# Patient Record
Sex: Male | Born: 1950 | Race: White | Hispanic: No | Marital: Married | Smoking: Former smoker
Health system: Southern US, Community
[De-identification: ages and names within clinical notes are randomized; demographics above are authoritative.]

## PROBLEM LIST (undated history)

## (undated) DIAGNOSIS — C4491 Basal cell carcinoma of skin, unspecified: Secondary | ICD-10-CM

## (undated) DIAGNOSIS — E8881 Metabolic syndrome: Secondary | ICD-10-CM

## (undated) DIAGNOSIS — L821 Other seborrheic keratosis: Secondary | ICD-10-CM

## (undated) DIAGNOSIS — F329 Major depressive disorder, single episode, unspecified: Secondary | ICD-10-CM

## (undated) DIAGNOSIS — C4490 Unspecified malignant neoplasm of skin, unspecified: Secondary | ICD-10-CM

## (undated) DIAGNOSIS — I2510 Atherosclerotic heart disease of native coronary artery without angina pectoris: Secondary | ICD-10-CM

## (undated) DIAGNOSIS — I209 Angina pectoris, unspecified: Secondary | ICD-10-CM

## (undated) DIAGNOSIS — G4730 Sleep apnea, unspecified: Secondary | ICD-10-CM

## (undated) DIAGNOSIS — K219 Gastro-esophageal reflux disease without esophagitis: Secondary | ICD-10-CM

## (undated) DIAGNOSIS — C44629 Squamous cell carcinoma of skin of left upper limb, including shoulder: Secondary | ICD-10-CM

## (undated) DIAGNOSIS — E119 Type 2 diabetes mellitus without complications: Secondary | ICD-10-CM

## (undated) DIAGNOSIS — E291 Testicular hypofunction: Secondary | ICD-10-CM

## (undated) DIAGNOSIS — I10 Essential (primary) hypertension: Secondary | ICD-10-CM

## (undated) DIAGNOSIS — R0601 Orthopnea: Secondary | ICD-10-CM

## (undated) DIAGNOSIS — I219 Acute myocardial infarction, unspecified: Secondary | ICD-10-CM

## (undated) DIAGNOSIS — F32A Depression, unspecified: Secondary | ICD-10-CM

## (undated) DIAGNOSIS — E785 Hyperlipidemia, unspecified: Secondary | ICD-10-CM

## (undated) HISTORY — PX: BACK SURGERY: SHX140

## (undated) HISTORY — DX: Hyperlipidemia, unspecified: E78.5

## (undated) HISTORY — DX: Unspecified malignant neoplasm of skin, unspecified: C44.90

## (undated) HISTORY — DX: Gastro-esophageal reflux disease without esophagitis: K21.9

## (undated) HISTORY — DX: Testicular hypofunction: E29.1

## (undated) HISTORY — DX: Basal cell carcinoma of skin, unspecified: C44.91

## (undated) HISTORY — DX: Metabolic syndrome: E88.810

## (undated) HISTORY — DX: Other seborrheic keratosis: L82.1

## (undated) HISTORY — PX: HERNIA REPAIR: SHX51

## (undated) HISTORY — DX: Major depressive disorder, single episode, unspecified: F32.9

## (undated) HISTORY — DX: Metabolic syndrome and other insulin resistance: E88.81

## (undated) HISTORY — PX: CORONARY STENT PLACEMENT: SHX1402

## (undated) HISTORY — DX: Depression, unspecified: F32.A

## (undated) HISTORY — DX: Squamous cell carcinoma of skin of left upper limb, including shoulder: C44.629

## (undated) HISTORY — PX: KNEE SURGERY: SHX244

---

## 2002-04-01 ENCOUNTER — Emergency Department (HOSPITAL_COMMUNITY): Admission: EM | Admit: 2002-04-01 | Discharge: 2002-04-02 | Payer: Self-pay | Admitting: Emergency Medicine

## 2002-04-02 ENCOUNTER — Encounter: Payer: Self-pay | Admitting: Emergency Medicine

## 2003-05-10 ENCOUNTER — Emergency Department (HOSPITAL_COMMUNITY): Admission: EM | Admit: 2003-05-10 | Discharge: 2003-05-10 | Payer: Self-pay | Admitting: Emergency Medicine

## 2003-12-16 ENCOUNTER — Emergency Department (HOSPITAL_COMMUNITY): Admission: EM | Admit: 2003-12-16 | Discharge: 2003-12-16 | Payer: Self-pay | Admitting: Emergency Medicine

## 2006-05-13 ENCOUNTER — Ambulatory Visit (HOSPITAL_BASED_OUTPATIENT_CLINIC_OR_DEPARTMENT_OTHER): Admission: RE | Admit: 2006-05-13 | Discharge: 2006-05-13 | Payer: Self-pay | Admitting: Surgery

## 2006-09-22 ENCOUNTER — Inpatient Hospital Stay (HOSPITAL_COMMUNITY): Admission: EM | Admit: 2006-09-22 | Discharge: 2006-09-24 | Payer: Self-pay | Admitting: Emergency Medicine

## 2006-11-17 ENCOUNTER — Ambulatory Visit (HOSPITAL_COMMUNITY): Admission: RE | Admit: 2006-11-17 | Discharge: 2006-11-17 | Payer: Self-pay | Admitting: Cardiology

## 2007-01-14 ENCOUNTER — Emergency Department (HOSPITAL_COMMUNITY): Admission: EM | Admit: 2007-01-14 | Discharge: 2007-01-14 | Payer: Self-pay | Admitting: Family Medicine

## 2007-03-24 ENCOUNTER — Encounter: Admission: RE | Admit: 2007-03-24 | Discharge: 2007-03-24 | Payer: Self-pay | Admitting: Surgery

## 2007-10-07 ENCOUNTER — Inpatient Hospital Stay (HOSPITAL_COMMUNITY): Admission: RE | Admit: 2007-10-07 | Discharge: 2007-10-09 | Payer: Self-pay | Admitting: Surgery

## 2008-02-24 ENCOUNTER — Observation Stay (HOSPITAL_COMMUNITY): Admission: EM | Admit: 2008-02-24 | Discharge: 2008-02-26 | Payer: Self-pay | Admitting: Emergency Medicine

## 2008-08-08 ENCOUNTER — Ambulatory Visit (HOSPITAL_COMMUNITY): Admission: RE | Admit: 2008-08-08 | Discharge: 2008-08-08 | Payer: Self-pay | Admitting: Cardiology

## 2008-10-04 ENCOUNTER — Ambulatory Visit (HOSPITAL_COMMUNITY): Admission: RE | Admit: 2008-10-04 | Discharge: 2008-10-04 | Payer: Self-pay | Admitting: Cardiology

## 2009-04-01 ENCOUNTER — Ambulatory Visit (HOSPITAL_COMMUNITY): Admission: RE | Admit: 2009-04-01 | Discharge: 2009-04-01 | Payer: Self-pay | Admitting: Surgery

## 2010-10-20 ENCOUNTER — Other Ambulatory Visit: Payer: Self-pay | Admitting: Occupational Medicine

## 2010-10-20 DIAGNOSIS — S335XXA Sprain of ligaments of lumbar spine, initial encounter: Secondary | ICD-10-CM

## 2010-10-24 ENCOUNTER — Ambulatory Visit (HOSPITAL_COMMUNITY)
Admission: RE | Admit: 2010-10-24 | Discharge: 2010-10-24 | Disposition: A | Discharge status: Home / Self Care | Payer: 59 | Source: Ambulatory Visit | Attending: Occupational Medicine | Admitting: Occupational Medicine

## 2010-10-24 ENCOUNTER — Other Ambulatory Visit: Payer: Self-pay | Admitting: Occupational Medicine

## 2010-10-24 DIAGNOSIS — M47817 Spondylosis without myelopathy or radiculopathy, lumbosacral region: Secondary | ICD-10-CM | POA: Insufficient documentation

## 2010-10-24 DIAGNOSIS — M79609 Pain in unspecified limb: Secondary | ICD-10-CM | POA: Insufficient documentation

## 2010-10-24 DIAGNOSIS — Z1810 Retained metal fragments, unspecified: Secondary | ICD-10-CM

## 2010-10-24 DIAGNOSIS — S335XXA Sprain of ligaments of lumbar spine, initial encounter: Secondary | ICD-10-CM

## 2010-11-21 ENCOUNTER — Ambulatory Visit: Payer: PRIVATE HEALTH INSURANCE | Attending: Occupational Medicine | Admitting: Physical Therapy

## 2010-11-21 DIAGNOSIS — M545 Low back pain, unspecified: Secondary | ICD-10-CM | POA: Insufficient documentation

## 2010-11-21 DIAGNOSIS — M2560 Stiffness of unspecified joint, not elsewhere classified: Secondary | ICD-10-CM | POA: Insufficient documentation

## 2010-11-21 DIAGNOSIS — IMO0001 Reserved for inherently not codable concepts without codable children: Secondary | ICD-10-CM | POA: Insufficient documentation

## 2010-11-24 ENCOUNTER — Ambulatory Visit: Payer: PRIVATE HEALTH INSURANCE | Admitting: Physical Therapy

## 2010-12-02 ENCOUNTER — Ambulatory Visit: Payer: PRIVATE HEALTH INSURANCE | Attending: Occupational Medicine | Admitting: Physical Therapy

## 2010-12-02 DIAGNOSIS — M545 Low back pain, unspecified: Secondary | ICD-10-CM | POA: Insufficient documentation

## 2010-12-02 DIAGNOSIS — IMO0001 Reserved for inherently not codable concepts without codable children: Secondary | ICD-10-CM | POA: Insufficient documentation

## 2010-12-02 DIAGNOSIS — M2560 Stiffness of unspecified joint, not elsewhere classified: Secondary | ICD-10-CM | POA: Insufficient documentation

## 2010-12-05 ENCOUNTER — Ambulatory Visit: Payer: PRIVATE HEALTH INSURANCE | Admitting: Physical Therapy

## 2010-12-08 ENCOUNTER — Encounter: Payer: 59 | Admitting: Physical Therapy

## 2010-12-12 ENCOUNTER — Ambulatory Visit: Payer: PRIVATE HEALTH INSURANCE | Admitting: Physical Therapy

## 2010-12-16 ENCOUNTER — Ambulatory Visit: Payer: PRIVATE HEALTH INSURANCE | Admitting: Physical Therapy

## 2010-12-16 NOTE — Discharge Summary (Signed)
[name], [name] NO.:  [id number]   MEDICAL RECORD NO.:  [id number]          PATIENT TYPE:  INP   LOCATION:  3728                         FACILITY:  [facility]   PHYSICIAN:  Ricki Rodriguez, M.D.  DATE OF BIRTH:  [birth year]   DATE OF ADMISSION:  02/24/2008  DATE OF DISCHARGE:  02/26/2008                               DISCHARGE SUMMARY   FINAL DIAGNOSES:  1. Bronchitis.  2. Chest pain.  3. Hypertension.  4. Tobacco use disorder.  5. Alcohol use disorder.  6. Hyperlipidemia.  7. Depression.   DISCHARGE DIET:  Low-sodium, heart-healthy diet.   DISCHARGE ACTIVITY:  The patient to increase activity slowly.   SPECIAL INSTRUCTION:  The patient to stop any activity that causes chest  pain, shortness of breath, dizziness, sweating, or excessive weakness.  Return to work after 48 hours.  Followup by Dr. Orpah Cobb or by primary care physician in 1-2 weeks.   HISTORY:  This 60 year old white male presented with 1-week history of  cough and cold along with some chest pain, has heavy feeling.  The  patient states his sputum is nasty and he was afebrile in the emergency  room and for the rest of his stay in the hospital.   PHYSICAL EXAMINATION:  VITAL SIGNS:  Temperature 98, pulse 75,  respirations 15, and blood pressure 113/82.  GENERAL:  The patient is a 60 year old white male, well built, well  nourished, in mild distress.  HEENT:  The patient is normocephalic and atraumatic.  Conjunctivae pink.  Sclerae white.  NECK:  No JVD.  LUNGS:  Decreased air entry at both bases.  HEART:  Normal S1 and S2.  ABDOMEN:  Soft and nontender.  EXTREMITIES:  No edema.  SKIN:  Warm and dry.  NEUROLOGICALLY:  The patient moved all 4 extremities.  Cranial nerves  are grossly intact.   LABORATORY DATA:  Normal hemoglobin, hematocrit, WBC count, and platelet  count.  Normal electrolytes, BUN, and creatinine.  Normal CK-MB and  troponin I.  Chest x-ray was suggestive of  bronchitis.   HOSPITAL COURSE:  The patient was placed in telemetry unit with a  droplet isolation.  He was started on IV Rocephin and IV Zithromax.  He  had significant improvement in 24-48 hours of hospitalization.  He  remained afebrile in the hospital.  His respiratory condition improved  and he was discharged home in satisfactory condition with a followup by  me or by his primary care physician in 1-2 weeks.      Ricki Rodriguez, M.D.  Electronically Signed     ASK/MEDQ  D:  02/26/2008  T:  02/26/2008  Job:  [id number]

## 2010-12-16 NOTE — Op Note (Signed)
Jimmy [name], Jimmy [name] NO.:  [id number]   MEDICAL RECORD NO.:  [id number]          PATIENT TYPE:  INP   LOCATION:  0004                         FACILITY:  [facility]   PHYSICIAN:  Wilmon Arms. Corliss Skains, M.D. DATE OF BIRTH:  [birth year]   DATE OF PROCEDURE:  10/07/2007  DATE OF DISCHARGE:                               OPERATIVE REPORT   PREOPERATIVE DIAGNOSIS:  Recurrent umbilical hernia.   POSTOPERATIVE DIAGNOSIS:  Recurrent umbilical hernia.   PROCEDURE PERFORMED:  Laparoscopic mesh repair of recurrent umbilical  hernia.   SURGEON:  Dr. Manus Rudd.   ANESTHESIA:  General endotracheal.   INDICATIONS:  The patient is a 60 year old male who underwent open  repair of an umbilical hernia with a small Ventralex mesh on May 13, 2006.  He presented in the summer of 2008 with recurrent swelling at the  umbilicus.  A CT scan showed recurrence in the lower part of his  herniorrhaphy containing only fat.  He denies any obstructive symptoms.  We waited to repair the recurrent hernia for cardiac reasons. The  patient has a new stent in place.  He has now been cleared by Dr.  Sharyn Lull and the plan is to repair of his hernia laparoscopically.   DESCRIPTION OF PROCEDURE:  The patient is brought to the operating room  and placed in a supine position on the operating room table.  After an  adequate level of general anesthesia was obtained, the patient had a  Foley catheter placed under sterile technique.  His abdomen was prepped  with Betadine and draped in a sterile fashion.  A time-out was taken to  assure the proper patient and proper procedure.  In the left anterior  axillary line, we used a 5-mm OptiVu trocar to cannulate the peritoneal  cavity.  We insufflated CO2 maintaining maximum pressure at 15 mmHg.  The laparoscope was inserted and we noted a lot of omental adhesions to  the undersurface of the umbilicus.  Two more 5 mm-ports were placed on  the left side.  The  harmonic scalpel was used to take down all of these  omental adhesions.  The mesh was exposed and actually seemed to be in  good placement.  However, with direct palpation it seemed that the  hernia had recurred at the lower edge of this mesh.  We then chose to  cover the entire mesh with a 10 x 15 cm Proceed mesh.  About four stay  sutures of #0 Prolene were placed superiorly, inferiorly and laterally  in the mesh.  The Endoclose device was used to pull up the stay sutures  through stab incisions.  This pulled the mesh up to cover all of the  previous mesh as well as the hernia defect.  The stay sutures were all  tied down.  The Protac device was then used to place a ring of tacks at  1 cm intervals all the way around the mesh.  Several extra tacks were  placed inside this outer ring of tacks. The omentum was placed to cover  the small bowel underneath the mesh.  We  then released the  pneumoperitoneum under direct vision as the trocars were removed. The  pressure released on the pneumoperitoneum.  The largest port site was  closed with a #0 Vicryl using the Endoclose device.  All the trocars were removed.  4-0 Monocryl was used to place some deep  dermal stitches.  Dermabond was used to close the skin.  The patient was  then extubated and brought to the recovery room in stable condition.  All sponge, instrument and needle counts were correct.  His Foley  catheter was removed.      Wilmon Arms. Tsuei, M.D.  Electronically Signed     MKT/MEDQ  D:  10/07/2007  T:  10/08/2007  Job:  [id number]   cc:   Eduardo Osier. Sharyn Lull, M.D.  Fax: [phone]

## 2010-12-16 NOTE — Cardiovascular Report (Signed)
NAMEFADI, MENTER NO.:  [id number]   MEDICAL RECORD NO.:  [id number]          PATIENT TYPE:  OIB   LOCATION:  2899                         FACILITY:  [facility]   PHYSICIAN:  Eduardo Osier. Sharyn Lull, M.D. DATE OF BIRTH:  04-04-1951   DATE OF PROCEDURE:  10/04/2008  DATE OF DISCHARGE:  10/04/2008                            CARDIAC CATHETERIZATION   PROCEDURE:  Left cardiac catheterization with selective left and right  coronary angiography, left ventricular graft via right groin using  Judkins technique.   INDICATIONS FOR PROCEDURE:  Mr. Winsor is a 60 year old white male with  past medical history significant for coronary artery disease, status  post PTCA stenting to proximal LAD, hypertension, hypercholesteremia,  history of tobacco abuse, GERD, depression, positive family history of  coronary artery disease, complains of recurrent left-sided chest pain  off and on, relieved with sublingual nitroglycerin, also complains of  exertional chest pain associated with feeling weak.  States chest pain  is similar in nature when he had PCI to LAD.  EKG done in the office  showed normal sinus rhythm with nonspecific T-wave changes.  Denies any  relation of chest pain to food, breathing, or movement.  Denies  palpitation, lightheadedness, or syncope.  Denies PND, orthopnea, or leg  swelling.   PAST MEDICAL HISTORY:  As above.   PAST SURGICAL HISTORY:  He had umbilical hernia repair in the past, had  wisdom tooth extraction in the past.   ALLERGIES:  No known drug allergies.   MEDICATIONS AT HOME:  1. He is on enteric-coated aspirin 81 mg p.o. daily.  2. Plavix 75 mg p.o. daily.  3. Toprol-XL 50 mg p.o. daily.  4. AZOR 5/40 p.o. daily.  5. Lipitor 20 mg p.o. daily.  6. Prevacid 30 mg p.o. daily.  7. Lexapro 10 mg p.o. daily.   SOCIAL HISTORY:  He is married and has one child.  Smoked less than one  pack per day for 20+ years.  Drinks beer socially.  Works for [employer].   Family history is positive for coronary artery disease.  His father died  of MI.  He was hypertensive.  Mother died of dementia.  Two brothers and  four sisters are in good health.   PHYSICAL EXAMINATION:  GENERAL:  He is alert, awake, and oriented x3 in  no acute distress.  VITAL SIGNS:  Blood pressure is 130/80, pulse was 76 and regular.  HEENT:  Conjunctiva was pink.  NECK:  Supple.  No JVD, no bruit.  LUNGS:  Clear to auscultation without rhonchi or rales.  CARDIOVASCULAR:  S1 and S2 was normal.  There was soft systolic murmur.  There was no S3 or gallop.  ABDOMEN:  Soft.  Bowel sounds were present and nontender.  EXTREMITIES:  There was no clubbing, cyanosis, or edema.   IMPRESSION:  New onset angina rule out progression of disease,  hypertension, hypercholesteremia, tobacco abuse, depression, positive  family history of coronary artery disease.  Discussed with the patient  regarding various options of treatment i.e. noninvasive stress testing  versus left cath, possible PTCA stenting.  Its risks and benefits i.e.  death, MI, stroke, need for emergency, CABG, risk of restenosis, local  vascular complications, etc. and consented for the procedure.   PROCEDURE:  After obtaining the informed consent, the patient was  brought to the cath lab and was placed on fluoroscopy table.  Right  groin was prepped and draped in usual fashion.  A 2% Xylocaine was used  for local anesthesia and the right groin with help of thin-wall needle.  A 5-French arterial sheath was placed without difficulty.  Sheath was  aspirated and flushed.  Next, a 5-French left Judkins catheter was  advanced over the wire under fluoroscopic guidance up to the ascending  aorta.  Wire was pulled out, the catheter was aspirated and connected to  the manifold.  Catheter was further advanced and engaged into left  coronary ostium.  Of note, the patient has short left main and this  catheter was  selectively engaged into left circumflex.  Multiple views  of this system were taken.  Next, catheter was disengaged and was  exchanged over the wire to 3.5 5 [place] diagnostic catheter, which was  advanced over the wire under fluoroscopic guidance up to the ascending  aorta.  Wire was pulled out, the catheter was aspirated and connected to  the manifold.  Catheter was further advanced and attempted to engage  into LAD without success.  Next, this catheter was pulled out over the  wire.  Sheaths were aspirated and flushed.  A 5-French sheath was  changed to 6-French sheath and then 3.0 left guiding catheter was  advanced over the wire under fluoroscopic guidance up to the ascending  aorta.  Wire was pulled out, the catheter was aspirated and connected to  the manifold.  Catheter was further advanced and engaged into left  coronary ostium.  Multiple views of the left system were taken.  Next,  the catheter was disengaged and was pulled out over the wire and was  replaced with 6-French right Judkins catheter, which was advanced over  the wire under fluoroscopic guidance up to the ascending aorta.  Wire  was pulled out, the catheter was aspirated and connected to the  manifold.  Catheter was further advanced and engaged into right coronary  ostium.  Multiple views of the right system were taken.  Next, the  catheter was disengaged and was pulled out over the wire and was  replaced with 6-French pigtail catheter which was advanced over the wire  under fluoroscopic guidance up to the ascending aorta.  Wire was pulled  out, the catheter was aspirated and connected to the manifold.  Catheter  was further advanced across the aortic valve into the LV.  LV pressures  were recorded.  Next, LV graft was done in 30 degrees RAO position.  Postangiographic pressures were recorded from LV and then pullback  pressures were recorded from the aorta and there was no gradient across  the aortic valve.  Next,  pigtail catheter was pulled out over the wire.  Sheaths were aspirated and flushed.   FINDINGS:  LV showed good LV systolic function, EF of 55-60%.  Left main  was short, which was patent.  LAD has 20-25% proximal stenosis at the  proximal edge of the stent and 5-10% stenosis at the distal edge of the  stent.  Diagonal 1 is small which has 15-20% stenosis.  Diagonal 2 and 3  were very small, left circumflex was patent.  OM1 and OM2 were very  small, which  were patent.  OM3 was moderate size, which was patent, OM4  was small, which was patent.  RCA has 15-20% proximal stenosis.  Vessel  is large and right dominant.  The patient has right dominant coronary  system.  PDA and PLV branches were patent.  The patient tolerated the  procedure well.  There were no complications.  The patient was  transferred to recovery room in stable condition.      Eduardo Osier. Sharyn Lull, M.D.  Electronically Signed     MNH/MEDQ  D:  10/04/2008  T:  10/05/2008  Job:  [id number]

## 2010-12-18 ENCOUNTER — Encounter: Payer: PRIVATE HEALTH INSURANCE | Admitting: Physical Therapy

## 2010-12-19 ENCOUNTER — Ambulatory Visit
Admission: RE | Admit: 2010-12-19 | Discharge: 2010-12-19 | Disposition: A | Discharge status: Home / Self Care | Payer: PRIVATE HEALTH INSURANCE | Source: Ambulatory Visit | Attending: Surgery | Admitting: Surgery

## 2010-12-19 ENCOUNTER — Other Ambulatory Visit (INDEPENDENT_AMBULATORY_CARE_PROVIDER_SITE_OTHER): Payer: Self-pay | Admitting: Surgery

## 2010-12-19 DIAGNOSIS — R52 Pain, unspecified: Secondary | ICD-10-CM

## 2010-12-19 DIAGNOSIS — R222 Localized swelling, mass and lump, trunk: Secondary | ICD-10-CM

## 2010-12-19 MED ORDER — IOHEXOL 300 MG/ML  SOLN: 100.0000 mL | Freq: Once | INTRAMUSCULAR | Status: AC | PRN

## 2010-12-19 NOTE — Discharge Summary (Signed)
Jimmy Green, Jimmy Green NO.:  [id number]   MEDICAL RECORD NO.:  [id number]          PATIENT TYPE:  INP   LOCATION:  6529                         FACILITY:  [facility]   PHYSICIAN:  Mohan N. Sharyn Lull, M.D. DATE OF BIRTH:  April 27, 1951   DATE OF ADMISSION:  09/22/2006  DATE OF DISCHARGE:  09/24/2006                               DISCHARGE SUMMARY   ADMITTING DIAGNOSIS:  1. New onset angina.  2. New onset hypertension.  3. GERD.  4. Her tobacco abuse.  5. Positive family history of coronary artery disease.  6. Depression.   FINAL DIAGNOSIS:  1. New onset angina status post PCI to LAD.  2. Hypertension.  3. Hypercholesteremia.  4. Tobacco abuse.  5. Positive family history of coronary artery disease.  6. Depression.   DISCHARGE MEDICATIONS:  1. Enteric-coated aspirin 325 mg one tablet daily for one month and      then 81 mg one tablet daily.  2. Plavix 75 mg one tablet daily with food.  3. Toprol XL 50 mg one tablet daily.  4. Avapro 300 mg one tablet daily.  5. Norvasc 5 mg one tablet daily.  6. Lipitor 40 mg one tablet daily.  7. Plavix 30 mg one capsule daily as before.  8. Lexapro 10 mg one tablet daily.  9. Nitrostat 0.4 mg sublingual used as directed.   DISCHARGE INSTRUCTIONS:  1. Diet:  Diet low salt, low cholesterol.  2. Special instructions:  Post PTCA stent instructions have been      given.  3. Activity:  The patient has been advised to avoid any lifting,      pushing or pulling for 48 hours.   FOLLOWUP:  Follow-up with me on Monday next week.   CONDITION ON DISCHARGE:  Monday next week.  Condition at discharge is  stable.  The patient is scheduled for phase II cardiac rehab as  outpatient.   BRIEF HISTORY AND HOSPITAL COURSE:  Mr. Jimmy Green is a 60 year old white  male with past medical history significant for GERD, positive family  history of coronary artery disease, tobacco abuse, depression, complains  of left-sided chest pain off and on  since this morning radiating to the  left arm associated with nausea and mild shortness of breath.  States  while at work this a.m. developed left-sided chest pain grade 10/10,  sharp in pressure, and was noted to have elevated blood pressure.  The  patient was referred to Urgent Care and was referred back to the ER.  Denies any history of exertional chest pain.  Denies chest pain at  present.  The patient states he felt weak and diaphoretic this morning  which lasted for a few minutes.   PAST MEDICAL HISTORY:  As above.   PAST SURGICAL HISTORY:  1. He had umbilical hernia repair approximately 6 months ago.  2. Wisdom tooth extraction many years ago.   ALLERGIES:  NO KNOWN DRUG ALLERGIES.   MEDICATIONS AT HOME:  1. Aspirin.  2. Prevacid.  3. Lexapro.   SOCIAL HISTORY:  He is married and has one child.  Smokes less than one  pack per week for 20+ years.  Drinks beer socially, occasionally he  works at [employer].   FAMILY HISTORY:  Father died of MI, was hypertensive.  He also had CA.  Mother died of dementia.  Two brothers and four sisters in good health.   PHYSICAL EXAMINATION:  GENERAL:  On examination, He was alert and  oriented x3.  Blood pressure was 161/97, pulse was 74 regular.  Conjunctivae was pink.  NECK:  Supple with JVD, no bruit.  LUNGS:  Clear to auscultation without rhonchi or rales.  CARDIOVASCULAR:  S1, S2.  normal.  There was soft S4 gallop and systolic  murmur.  ABDOMEN:  Soft.  Bowel sounds present, nontender.  EXTREMITIES: There is no clubbing, cyanosis or edema.   STUDIES:  EKG showed normal sinus rhythm with no acute ischemic changes.   LABORATORY DATA:  Cholesterol was 209, triglyceride 167, HDL was  slightly low at 35, LDL was elevated 141.  Three sets of cardiac enzymes  were negative.  CK 98, MB 1.2; second set CK 97, MB 1.0; third set CK  65, MB 1.9.  Troponin I three sets were 0.02.  Sodium 137, potassium  4.3, chloride 108,  bicarbonate 27, glucose was 99, BUN 22, creatinine  0.64.  Hemoglobin was 15.1, hematocrit 44.0, white count 5.7.  Post  procedure CPK is 81, MB 3.4.  Today his labs were hemoglobin was 15.3,  hematocrit 43.1, potassium 3.9, glucose 95.   BRIEF HOSPITAL COURSE:  The patient was admitted to telemetry unit.  MI  was ruled out by serial enzymes and EKGs.  The patient underwent PTCA  and left cardiac cath and PTCA and stenting to LAD as per procedure  report on February 21.  Patient tolerated procedure well.  Postprocedure, the patient did not have any anginal chest pain.  The patient has been ambulating without any problems and was stable with  no evidence of hematoma.  Postprocedure EKG and cardiac findings are  normal.  The patient will be discharged home on above medications and  will be followed up in my office in the next week.           ______________________________  Eduardo Osier. Sharyn Lull, M.D.     MNH/MEDQ  D:  09/24/2006  T:  09/24/2006  Job:  [id number]

## 2010-12-19 NOTE — Op Note (Signed)
NAMEFRANKE, MENTER              ACCOUNT NO.:  [id number]   MEDICAL RECORD NO.:  [id number]          PATIENT TYPE:  AMB   LOCATION:  [facility]                         FACILITY:  [facility]   PHYSICIAN:  Wilmon Arms. Corliss Skains, M.D. DATE OF BIRTH:  09-May-1951   DATE OF PROCEDURE:  05/13/2006  DATE OF DISCHARGE:                                 OPERATIVE REPORT   PREOPERATIVE DIAGNOSIS:  Umbilical hernia.   POSTOPERATIVE DIAGNOSIS:  Umbilical hernia.   PROCEDURE PERFORMED:  Umbilical hernia repair with mesh.   SURGEON:  Wilmon Arms. Tsuei, M.D.   ANESTHESIA:  General via LMA.   INDICATIONS:  The patient is a 60 year old male who was lifting something  heavy in July 2007.  He felt a tearing sensation just above his umbilicus.  After that, he noticed a bulge above his umbilicus.  This has been fairly  tender. He denies any obstructive symptoms.  On examination, the patient was  noticed to have a rectus diastasis as well as an umbilical hernia.  There is  no surgical intervention indicated for the rectus diastasis, but we  recommended umbilical hernia repair with mesh.   DESCRIPTION OF PROCEDURE:  The patient was brought to the operating room and  placed in a supine position on the operating room table.  After an adequate  level of general anesthesia was obtained, the patient's abdomen was shaved,  prepped with Betadine and draped in a sterile fashion.  A time-out was taken  to assure the proper patient and proper procedure.  A transverse incision  was made above the umbilicus after infiltrating with 0.25% Marcaine.  Dissection was carried down in the subcutaneous tissues with Bovie cautery.  We encountered a moderate size hernia sac.  We continued mobilizing this  hernia down to the fascia.  The fascial opening was very small measuring  only about 8 mm in diameter.  This was oriented in a transverse fashion.  We  had to open the fascial opening slightly larger to allow the hernia sac to  be  completely reduced.  The preperitoneal space was bluntly dissected with a  finger.  The muscle was healthy and intact around the fascial defect.  A  small Ventralex mesh was then inserted into the preperitoneal space.  This  was secured with 4 interrupted transfascial #1 Prolene sutures.  The tapes  of the Ventralex mesh were then amputated.  The fascia was closed with 2  interrupted figure-of-eight #1 Prolene sutures.  3-0 Vicryl was used to  close the subcutaneous tissues and 4-0 Monocryl was then used to close the  skin.  Steri-Strips and clean dressings were applied.  The patient was  extubated and brought to the recovery room in stable condition.  All sponge,  instrument and needle counts were correct.      Wilmon Arms. Tsuei, M.D.  Electronically Signed     MKT/MEDQ  D:  05/13/2006  T:  05/15/2006  Job:  [id number]

## 2010-12-19 NOTE — Cardiovascular Report (Signed)
[name], Jimmy Green NO.:  [id number]   MEDICAL RECORD NO.:  [id number]          PATIENT TYPE:  INP   LOCATION:  2807                         FACILITY:  [facility]   PHYSICIAN:  Eduardo Osier. Sharyn Lull, M.D. DATE OF BIRTH:  [birth year]   DATE OF PROCEDURE:  09/23/2006  DATE OF DISCHARGE:                            CARDIAC CATHETERIZATION   PROCEDURES:  1. Left cardiac cath with selective left and right coronary      angiography, left ventriculography via right groin using Judkins      technique.  2. Successful PTCA to mid LAD using 2.5 x 12 mm long Voyager balloon.  3. Successful deployment of 3 x 23 mm long Cypher drug-eluting stent      in mid LAD.  4. Successful dilatation of Cypher drug-eluting stent using 3.25 x 13      mm long PowerSail balloon.   INDICATIONS FOR PROCEDURE:  Jimmy Green is a 60 year old white male with  past medical history significant for GERD, positive family history of  coronary artery disease, tobacco abuse, complains of left-sided chest  pain off and on since this a.m. radiating to the left arm associated  with nausea and mild shortness of breath.  States while at work this  a.m., developed left-sided chest pain, grade 10/10, sharp and pressure-  like and was noted to have elevated blood pressure.  The patient was  referred to Urgent Care and then referred back to [facility]. [facility] ER due to typical anginal chest pain.  The patient denies  history of exertional chest pain.  Denies chest pain at present.  States  felt weak and diaphoretic this morning for a few minutes during chest  pain.  The patient was admitted to telemetry unit.  MI was ruled out by  serial enzymes and EKG.  Due to typical anginal chest pain, discussed  with the patient and his wife regarding various options of treatment,  i.e., noninvasive stress testing versus left cath, possible PTCA  stenting, its risks and benefits, i.e., death, MI, stroke, need for  emergency CABG, risk of restenosis, local vascular complications, etc.  and consented for the procedure.   PROCEDURE:  After obtaining informed consent, the patient was brought to  the cath lab and was placed on fluoroscopy table.  Right groin was  prepped and draped in usual fashion.  We used 2% Xylocaine for local  anesthesia in the right groin.  With the help of thin-wall needle, 6-  French arterial sheath was placed.  Sheath was aspirated and flushed.  Next, 6-French left Judkins catheter was advanced over the wire under  fluoroscopic guidance up to the ascending aorta.  Wire was pulled out,  the catheter was aspirated and connected to the manifold.  Catheter was  further advanced and engaged into left coronary ostium.  Multiple views  of the left system were taken.  Next, the catheter was disengaged and  was pulled out over the wire and was replaced with 6-French right  Judkins catheter which was advanced over the wire under fluoroscopic  guidance up to the ascending aorta.  Catheter was further advanced and  engaged into right coronary ostium.  Multiple views of the right system  were taken.  Next, the catheter was disengaged and was pulled out over  the wire and was replaced with 6-French pigtail catheter which was  advanced over the wire under fluoroscopic guidance up to the ascending  aorta.  Catheter was further advanced across the aortic valve into LV.  LV pressures were recorded.  Next, left ventriculography was done in 30  degree RAO position.  Post angiographic pressures were recorded from LV  and then pullback pressures were recorded from the aorta.  There was no  significant gradient across the aortic valve.  Next, the next the  pigtail catheter was pulled out over the wire.  Sheaths were aspirated  and flushed.   FINDINGS:  LV showed good LV systolic function, EF of 55-60%.  Left main  was short which was patent.  LAD had 70-85% sequential mid stenosis with  bifurcation  with diagonal #2 which was very small vessel.  Diagonal #1  had 20-30% proximal stenosis.  Diagonal #2 was very, very small.  Diagonal #3 was very small which was patent.  Left circumflex was  patent.  OM-1 and OM-2 were very small which were patent.  OM-3 was  moderate size which was patent.  OM-4 was small which was patent.  RCA  was large which had 20-25% proximal stenosis.  PDA and PLV branches were  patent.  The patient has right dominant coronary system.   INTERVENTIONAL PROCEDURE:  Successful PTCA to mid LAD was done using 2.5  x 12 mm long Voyager balloon for predilatation and then 3 x 23 mm long  Cypher drug-eluting stent was deployed at 13 atmospheres of pressure in  mid LAD.  Stent was post dilated using 3.25 x 13 mm long PowerSail  balloon going up to 15-18 atmospheres of pressure.  Lesion was dilated  from 70-85 to 0% residual with excellent TIMI grade 3 distal flow  without evidence of dissection or distal embolization.  The patient  received weight-based heparin, Integrilin and 300 mg of Plavix prior to  the procedure.  The patient tolerated procedure well.  There were no  complications.  The patient was transferred to recovery room in stable  condition.           ______________________________  Eduardo Osier Sharyn Lull, M.D.     [facility]/MEDQ  D:  09/23/2006  T:  09/23/2006  Job:  [id number]   cc:   Cath Lab

## 2011-01-21 ENCOUNTER — Encounter (INDEPENDENT_AMBULATORY_CARE_PROVIDER_SITE_OTHER): Payer: Self-pay | Admitting: Surgery

## 2011-04-22 ENCOUNTER — Inpatient Hospital Stay (INDEPENDENT_AMBULATORY_CARE_PROVIDER_SITE_OTHER)
Admission: RE | Admit: 2011-04-22 | Discharge: 2011-04-22 | Disposition: A | Discharge status: Home / Self Care | Payer: 59 | Source: Ambulatory Visit | Attending: Emergency Medicine | Admitting: Emergency Medicine

## 2011-04-22 DIAGNOSIS — B356 Tinea cruris: Secondary | ICD-10-CM

## 2011-04-22 DIAGNOSIS — L259 Unspecified contact dermatitis, unspecified cause: Secondary | ICD-10-CM

## 2011-04-27 LAB — DIFFERENTIAL
Basophils Absolute: 0
Basophils Relative: 0
Eosinophils Absolute: 0.1
Eosinophils Relative: 2
Lymphs Abs: 1.3
Neutrophils Relative %: 68

## 2011-04-27 LAB — CBC
MCV: 92
Platelets: 311
RDW: 13.2
WBC: 6.2

## 2011-04-27 LAB — BASIC METABOLIC PANEL
BUN: 10
Chloride: 105
Creatinine, Ser: 0.63
Glucose, Bld: 110 — ABNORMAL HIGH

## 2011-05-01 LAB — CBC
MCV: 95.9
Platelets: 267
RBC: 4.57
WBC: 5.9

## 2011-05-01 LAB — POCT CARDIAC MARKERS
CKMB, poc: 1.2
Myoglobin, poc: 94.2
Operator id: 234501
Troponin i, poc: <0.05

## 2011-05-01 LAB — POCT I-STAT, CHEM 8
BUN: 12
Chloride: 102
Creatinine, Ser: 0.7
Potassium: 3.3 — ABNORMAL LOW
Sodium: 140

## 2011-05-01 LAB — CULTURE, BLOOD (ROUTINE X 2)

## 2011-05-01 LAB — CK TOTAL AND CKMB (NOT AT ARMC): CK, MB: 1.1

## 2011-05-01 LAB — APTT: aPTT: 33

## 2011-05-01 LAB — CARDIAC PANEL(CRET KIN+CKTOT+MB+TROPI)
Relative Index: INVALID
Troponin I: 0.01
Troponin I: <0.01

## 2011-05-01 LAB — LIPID PANEL
HDL: 23 — ABNORMAL LOW
Triglycerides: 180 — ABNORMAL HIGH

## 2011-10-01 ENCOUNTER — Encounter (HOSPITAL_COMMUNITY): Payer: Self-pay | Admitting: Emergency Medicine

## 2011-10-01 ENCOUNTER — Inpatient Hospital Stay (HOSPITAL_COMMUNITY)
Admission: EM | Admit: 2011-10-01 | Discharge: 2011-10-02 | DRG: 303 | Disposition: A | Discharge status: Home / Self Care | Payer: 59 | Source: Ambulatory Visit | Attending: Cardiology | Admitting: Cardiology

## 2011-10-01 ENCOUNTER — Other Ambulatory Visit: Payer: Self-pay

## 2011-10-01 DIAGNOSIS — I200 Unstable angina: Secondary | ICD-10-CM | POA: Diagnosis present

## 2011-10-01 DIAGNOSIS — F3289 Other specified depressive episodes: Secondary | ICD-10-CM | POA: Diagnosis present

## 2011-10-01 DIAGNOSIS — I10 Essential (primary) hypertension: Secondary | ICD-10-CM | POA: Diagnosis present

## 2011-10-01 DIAGNOSIS — Z8249 Family history of ischemic heart disease and other diseases of the circulatory system: Secondary | ICD-10-CM

## 2011-10-01 DIAGNOSIS — I209 Angina pectoris, unspecified: Secondary | ICD-10-CM | POA: Diagnosis present

## 2011-10-01 DIAGNOSIS — R55 Syncope and collapse: Secondary | ICD-10-CM | POA: Diagnosis present

## 2011-10-01 DIAGNOSIS — Z7982 Long term (current) use of aspirin: Secondary | ICD-10-CM

## 2011-10-01 DIAGNOSIS — I2510 Atherosclerotic heart disease of native coronary artery without angina pectoris: Principal | ICD-10-CM | POA: Diagnosis present

## 2011-10-01 DIAGNOSIS — Z79899 Other long term (current) drug therapy: Secondary | ICD-10-CM

## 2011-10-01 DIAGNOSIS — Z9861 Coronary angioplasty status: Secondary | ICD-10-CM

## 2011-10-01 DIAGNOSIS — K219 Gastro-esophageal reflux disease without esophagitis: Secondary | ICD-10-CM | POA: Diagnosis present

## 2011-10-01 DIAGNOSIS — F329 Major depressive disorder, single episode, unspecified: Secondary | ICD-10-CM | POA: Diagnosis present

## 2011-10-01 DIAGNOSIS — E7800 Pure hypercholesterolemia, unspecified: Secondary | ICD-10-CM | POA: Diagnosis present

## 2011-10-01 HISTORY — DX: Atherosclerotic heart disease of native coronary artery without angina pectoris: I25.10

## 2011-10-01 HISTORY — DX: Essential (primary) hypertension: I10

## 2011-10-01 LAB — DIFFERENTIAL
Basophils Relative: 1 % (ref 0–1)
Basophils Relative: 1 % (ref 0–1)
Eosinophils Absolute: 0.3 10*3/uL (ref 0.0–0.7)
Eosinophils Relative: 3 % (ref 0–5)
Eosinophils Relative: 3 % (ref 0–5)
Lymphocytes Relative: 56 % — ABNORMAL HIGH (ref 12–46)
Lymphs Abs: 7 10*3/uL — ABNORMAL HIGH (ref 0.7–4.0)
Monocytes Absolute: 0.7 10*3/uL (ref 0.1–1.0)
Monocytes Relative: 6 % (ref 3–12)
Neutro Abs: 4.2 10*3/uL (ref 1.7–7.7)
Neutro Abs: 3.5 10*3/uL (ref 1.7–7.7)

## 2011-10-01 LAB — COMPREHENSIVE METABOLIC PANEL
ALT: 30 U/L (ref 0–53)
AST: 28 U/L (ref 0–37)
Albumin: 3.4 g/dL — ABNORMAL LOW (ref 3.5–5.2)
Alkaline Phosphatase: 85 U/L (ref 39–117)
BUN: 12 mg/dL (ref 6–23)
CO2: 24 mEq/L (ref 19–32)
Calcium: 9.9 mg/dL (ref 8.4–10.5)
Chloride: 104 mEq/L (ref 96–112)
Creatinine, Ser: 0.55 mg/dL (ref 0.50–1.35)
GFR calc Af Amer: >90 mL/min (ref >90)

## 2011-10-01 LAB — CBC
HCT: 44.9 % (ref 39.0–52.0)
HCT: 41.3 % (ref 39.0–52.0)
Hemoglobin: 15.7 g/dL (ref 13.0–17.0)
Hemoglobin: 14.2 g/dL (ref 13.0–17.0)
MCH: 31.8 pg (ref 26.0–34.0)
MCHC: 35 g/dL (ref 30.0–36.0)
MCHC: 34.4 g/dL (ref 30.0–36.0)
MCV: 92.6 fL (ref 78.0–100.0)
WBC: 12.3 10*3/uL — ABNORMAL HIGH (ref 4.0–10.5)

## 2011-10-01 LAB — PROTIME-INR: INR: 1.07 (ref 0.00–1.49)

## 2011-10-01 LAB — POCT I-STAT TROPONIN I: Troponin i, poc: 0 ng/mL (ref 0.00–0.08)

## 2011-10-01 LAB — CARDIAC PANEL(CRET KIN+CKTOT+MB+TROPI)
Relative Index: INVALID (ref 0.0–2.5)
Relative Index: INVALID (ref 0.0–2.5)
Troponin I: <0.3 ng/mL (ref <0.30)
Troponin I: <0.3 ng/mL (ref <0.30)

## 2011-10-01 LAB — URINALYSIS, ROUTINE W REFLEX MICROSCOPIC
Bilirubin Urine: NEGATIVE
Hgb urine dipstick: NEGATIVE
Nitrite: NEGATIVE
Specific Gravity, Urine: 1.022 (ref 1.005–1.030)
pH: 5.5 (ref 5.0–8.0)

## 2011-10-01 LAB — BASIC METABOLIC PANEL
BUN: 12 mg/dL (ref 6–23)
Chloride: 105 mEq/L (ref 96–112)
Glucose, Bld: 117 mg/dL — ABNORMAL HIGH (ref 70–99)
Potassium: 3.9 mEq/L (ref 3.5–5.1)

## 2011-10-01 LAB — PATHOLOGIST SMEAR REVIEW: Tech Review: REACTIVE

## 2011-10-01 MED ORDER — CLOPIDOGREL BISULFATE 75 MG PO TABS
75.0000 mg | ORAL_TABLET | Freq: Every day | ORAL | Status: DC
  Administered 2011-10-02: 75 mg via ORAL
  Filled 2011-10-01: qty 1

## 2011-10-01 MED ORDER — ESCITALOPRAM OXALATE 10 MG PO TABS
10.0000 mg | ORAL_TABLET | Freq: Every day | ORAL | Status: DC
  Administered 2011-10-01 – 2011-10-02 (×2): 10 mg via ORAL
  Filled 2011-10-01 (×3): qty 1

## 2011-10-01 MED ORDER — HEPARIN (PORCINE) IN NACL 100-0.45 UNIT/ML-% IJ SOLN
1600.0000 [IU]/h | INTRAMUSCULAR | Status: DC
  Administered 2011-10-02: 1600 [IU]/h via INTRAVENOUS
  Administered 2011-10-02: 1450 [IU]/h via INTRAVENOUS
  Administered 2011-10-02: 1550 [IU]/h via INTRAVENOUS
  Filled 2011-10-01 (×3): qty 250

## 2011-10-01 MED ORDER — PANTOPRAZOLE SODIUM 40 MG PO TBEC
40.0000 mg | DELAYED_RELEASE_TABLET | Freq: Every day | ORAL | Status: DC
  Administered 2011-10-01 – 2011-10-02 (×2): 40 mg via ORAL
  Filled 2011-10-01 (×2): qty 1

## 2011-10-01 MED ORDER — HEPARIN BOLUS VIA INFUSION
4000.0000 [IU] | Freq: Once | INTRAVENOUS | Status: AC
  Administered 2011-10-01: 4000 [IU] via INTRAVENOUS

## 2011-10-01 MED ORDER — NITROGLYCERIN 0.4 MG SL SUBL: 0.4000 mg | SUBLINGUAL_TABLET | SUBLINGUAL | Status: DC | PRN

## 2011-10-01 MED ORDER — ROSUVASTATIN CALCIUM 20 MG PO TABS
20.0000 mg | ORAL_TABLET | Freq: Every day | ORAL | Status: DC
  Administered 2011-10-01 – 2011-10-02 (×2): 20 mg via ORAL
  Filled 2011-10-01 (×3): qty 1

## 2011-10-01 MED ORDER — ASPIRIN EC 81 MG PO TBEC
81.0000 mg | DELAYED_RELEASE_TABLET | Freq: Every day | ORAL | Status: DC
  Administered 2011-10-01 – 2011-10-02 (×2): 81 mg via ORAL
  Filled 2011-10-01 (×3): qty 1

## 2011-10-01 MED ORDER — METOPROLOL SUCCINATE ER 50 MG PO TB24
50.0000 mg | ORAL_TABLET | Freq: Every day | ORAL | Status: DC
  Administered 2011-10-01 – 2011-10-02 (×2): 50 mg via ORAL
  Filled 2011-10-01 (×3): qty 1

## 2011-10-01 MED ORDER — ASPIRIN EC 81 MG PO TBEC: 81.0000 mg | DELAYED_RELEASE_TABLET | Freq: Every day | ORAL | Status: DC

## 2011-10-01 MED ORDER — CLOPIDOGREL BISULFATE 300 MG PO TABS
300.0000 mg | ORAL_TABLET | Freq: Once | ORAL | Status: AC
  Administered 2011-10-01: 300 mg via ORAL
  Filled 2011-10-01: qty 1

## 2011-10-01 MED ORDER — NITROGLYCERIN IN D5W 200-5 MCG/ML-% IV SOLN: 5.0000 ug/min | INTRAVENOUS | Status: DC

## 2011-10-01 MED ORDER — HEPARIN (PORCINE) IN NACL 100-0.45 UNIT/ML-% IJ SOLN
1250.0000 [IU]/h | INTRAMUSCULAR | Status: AC
  Administered 2011-10-01: 1250 [IU]/h via INTRAVENOUS
  Filled 2011-10-01 (×2): qty 250

## 2011-10-01 MED ORDER — NITROGLYCERIN IN D5W 200-5 MCG/ML-% IV SOLN
5.0000 ug/min | INTRAVENOUS | Status: DC
Start: 2011-10-01 — End: 2011-10-02
  Administered 2011-10-01: 5 ug/min via INTRAVENOUS
  Filled 2011-10-01: qty 250

## 2011-10-01 MED ORDER — ONDANSETRON HCL 4 MG/2ML IJ SOLN
4.0000 mg | Freq: Four times a day (QID) | INTRAMUSCULAR | Status: DC | PRN
  Administered 2011-10-02: 4 mg via INTRAVENOUS
  Filled 2011-10-01: qty 2

## 2011-10-01 MED ORDER — ACETAMINOPHEN 325 MG PO TABS
650.0000 mg | ORAL_TABLET | ORAL | Status: DC | PRN
  Administered 2011-10-01 – 2011-10-02 (×4): 650 mg via ORAL
  Filled 2011-10-01 (×4): qty 2

## 2011-10-01 MED ORDER — ASPIRIN 81 MG PO CHEW
243.0000 mg | CHEWABLE_TABLET | Freq: Once | ORAL | Status: AC
  Administered 2011-10-01: 243 mg via ORAL
  Filled 2011-10-01: qty 3

## 2011-10-01 MED ORDER — HEPARIN BOLUS VIA INFUSION
2000.0000 [IU] | Freq: Once | INTRAVENOUS | Status: AC
  Administered 2011-10-01: 2000 [IU] via INTRAVENOUS
  Filled 2011-10-01: qty 2000

## 2011-10-01 MED ORDER — NITROGLYCERIN 0.4 MG SL SUBL
SUBLINGUAL_TABLET | SUBLINGUAL | Status: AC
  Filled 2011-10-01: qty 25

## 2011-10-01 MED ORDER — ALPRAZOLAM 0.25 MG PO TABS: 0.2500 mg | ORAL_TABLET | Freq: Two times a day (BID) | ORAL | Status: DC | PRN

## 2011-10-01 NOTE — ED Provider Notes (Signed)
History     CSN: [id number]  Arrival date & time 10/01/11  4010   None     Chief Complaint  Patient presents with  . Chest Pain    (Consider location/radiation/quality/duration/timing/severity/associated sxs/prior treatment) Patient is a 61 y.o. male presenting with chest pain. The history is provided by the patient. No language interpreter was used.  Chest Pain The chest pain began less than 1 hour ago. Chest pain occurs intermittently. The chest pain is resolved. At its most intense, the pain is at 8/10. The pain is currently at 0/10. The severity of the pain is moderate. The quality of the pain is described as aching and sharp. The pain radiates to the left jaw. Chest pain is worsened by exertion. Primary symptoms include fatigue, shortness of breath and dizziness. Pertinent negatives for primary symptoms include no fever, no syncope, no cough, no wheezing, no palpitations, no abdominal pain, no nausea, no vomiting and no altered mental status.  Dizziness does not occur with nausea, vomiting or diaphoresis.  Associated symptoms include near-syncope.  Pertinent negatives for associated symptoms include no diaphoresis, no lower extremity edema, no numbness and no paroxysmal nocturnal dyspnea. Risk factors include male gender, smoking/tobacco exposure and alcohol intake.  His past medical history is significant for CAD, hyperlipidemia and hypertension.  Pertinent negatives for past medical history include no aneurysm, no anxiety/panic attacks, no aortic aneurysm, no aortic dissection, no arrhythmia, no bicuspid aortic valve, no cancer, no congenital heart disease, no COPD, no CHF, no diabetes, no DVT, no MI, no mitral valve prolapse, no pacemaker, no PE, no rheumatic fever, no seizures, no sickle cell disease, no strokes, no TIA and no valve disorder.  His family medical history is significant for CAD in family, heart disease in family, hyperlipidemia in family and hypertension in family.    Pertinent negatives for family medical history include: no diabetes in family, no early MI in family, no PE in family, no stroke in family, no sudden death in family and no TIA in family.  Procedure history is positive for cardiac catheterization, echocardiogram, persantine thallium, stress echo, stress thallium and exercise treadmill test.     Past Medical History  Diagnosis Date  . Hyperlipidemia   . Hypertension   . Coronary artery disease     Past Surgical History  Procedure Date  . Hernia repair   . Coronary stent placement     Family History  Problem Relation Age of Onset  . Cancer Father   . Heart disease Father     History  Substance Use Topics  . Smoking status: Never Smoker   . Smokeless tobacco: Never Used  . Alcohol Use: No      Review of Systems  Constitutional: Positive for fatigue. Negative for fever and diaphoresis.  Respiratory: Positive for shortness of breath. Negative for cough and wheezing.   Cardiovascular: Positive for chest pain and near-syncope. Negative for palpitations and syncope.  Gastrointestinal: Negative for nausea, vomiting and abdominal pain.  Neurological: Positive for dizziness. Negative for seizures and numbness.  Psychiatric/Behavioral: Negative for altered mental status.  All other systems reviewed and are negative.    Allergies  Review of patient's allergies indicates no known allergies.  Home Medications   Current Outpatient Rx  Name Route Sig Dispense Refill  . AMLODIPINE-OLMESARTAN 5-40 MG PO TABS Oral Take 1 tablet by mouth daily.      . ASPIRIN EC 81 MG PO TBEC Oral Take 81 mg by mouth daily.    [name]  ATORVASTATIN CALCIUM 20 MG PO TABS Oral Take 20 mg by mouth daily.      [name] ESCITALOPRAM OXALATE 10 MG PO TABS Oral Take 10 mg by mouth daily.      [name] METOPROLOL SUCCINATE ER 50 MG PO TB24 Oral Take 50 mg by mouth daily.      [name] OMEPRAZOLE 20 MG PO CPDR Oral Take 20 mg by mouth daily.        BP 139/77  Pulse 73  Temp  97.7 F (36.5 C)  SpO2 97%  Physical Exam  Nursing note and vitals reviewed. Constitutional: He is oriented to person, place, and time. He appears well-developed and well-nourished.  HENT:  Head: Normocephalic and atraumatic.  Eyes: Pupils are equal, round, and reactive to light.  Neck: Neck supple.  Cardiovascular: Normal rate and regular rhythm.  Exam reveals no gallop and no friction rub.   No murmur heard. Pulmonary/Chest: Breath sounds normal. No respiratory distress.  Abdominal: Soft. He exhibits no distension.  Musculoskeletal: Normal range of motion.  Neurological: He is alert and oriented to person, place, and time. No cranial nerve deficit.  Skin: Skin is warm and dry.  Psychiatric: He has a normal mood and affect.    ED Course  Procedures (including critical care time)   Labs Reviewed  CBC  DIFFERENTIAL  BASIC METABOLIC PANEL  URINALYSIS, ROUTINE W REFLEX MICROSCOPIC   No results found.   No diagnosis found.    MDM   61 year old male coming in with intermittent chest pain x4 days. States that the last episode was this morning while he was working on the grounds of [facility] [employer] and he took a nitroglycerin which resolved the pain. States that he did get short of breath with this pain. The pain is located in his left chest. The pain is dull with intermittent sharp pains that last a few seconds. Dr. harm 1 we will admit this patient to telemetry bed. Nitroglycerin and heparin were started in the ER. Patient has a past medical history for stent 4 years ago and a cardiac cath 2 years ago with minimal blockages.   Date: 10/01/2011  Rate: 77  Rhythm: normal sinus rhythm  QRS Axis: normal  Intervals: normal  ST/T Wave abnormalities: normal  Conduction Disutrbances:none  Narrative Interpretation:   Old EKG Reviewed: unchanged Labs Reviewed  CBC - Abnormal; Notable for the following:    WBC 12.3 (*)    All other components within normal limits   DIFFERENTIAL - Abnormal; Notable for the following:    Neutrophils Relative 34 (*)    Lymphocytes Relative 56 (*)    Lymphs Abs 6.9 (*)    All other components within normal limits  BASIC METABOLIC PANEL - Abnormal; Notable for the following:    Glucose, Bld 117 (*)    All other components within normal limits  URINALYSIS, ROUTINE W REFLEX MICROSCOPIC  POCT I-STAT TROPONIN I         Jethro Bastos, NP 10/01/11 1158  Jethro Bastos, NP 10/01/11 1159  Jethro Bastos, NP 10/01/11 1218

## 2011-10-01 NOTE — Progress Notes (Signed)
ANTICOAGULATION CONSULT NOTE - Initial Consult  Pharmacy Consult for Heparin Indication: chest pain/ACS  Assessment: 61 yo male with a history of CAD who presents to the ED with intermittent chest pain for 4 days. Pharmacy consulted to manage heparin. Baseline labs are normal.  Goal of Therapy:  Heparin level 0.3-0.7 units/ml   Plan:  1. Heparin 4000 units IV bolus then 1250 units/hr (12.5 ml/hr) 2. Heparin level 6 hrs after started 3. Daily heparin level and CBC   No Known Allergies  Estimated Patient Measurements: Height: 69" Weight: 95.5 kg Heparin Dosing Weight: 90.5 kg  Vital Signs: Temp: 97.7 F (36.5 C) (02/28 0920) BP: 129/78 mmHg (02/28 1145) Pulse Rate: 69  (02/28 1145)  Labs:  Basename 10/01/11 0938  HGB 15.7  HCT 44.9  PLT 259  APTT --  LABPROT --  INR --  HEPARINUNFRC --  CREATININE 0.54  CKTOTAL --  CKMB --  TROPONINI --   CrCl is unknown because there is no height on file for the current visit.  Medical History: Past Medical History  Diagnosis Date  . Hyperlipidemia   . Hypertension   . Coronary artery disease     Medications: Medications Prior to Admission  Medication Dose Route Frequency Provider Last Rate Last Dose  . aspirin chewable tablet 243 mg  243 mg Oral Once Robynn Pane, MD   243 mg at 10/01/11 1002  . nitroGLYCERIN (NITROSTAT) 0.4 MG SL tablet           . nitroGLYCERIN 0.2 mg/mL in dextrose 5 % infusion  5 mcg/min Intravenous Titrated Jethro Bastos, NP 1.5 mL/hr at 10/01/11 1209 5 mcg/min at 10/01/11 1209   Medications Prior to Admission  Medication Sig Dispense Refill  . amLODipine-olmesartan (AZOR) 5-40 MG per tablet Take 1 tablet by mouth daily.        [name] atorvastatin (LIPITOR) 20 MG tablet Take 20 mg by mouth daily.        [name] escitalopram (LEXAPRO) 10 MG tablet Take 10 mg by mouth daily.        . metoprolol (TOPROL-XL) 50 MG 24 hr tablet Take 50 mg by mouth daily.        [name] omeprazole (PRILOSEC) 20 MG capsule Take  20 mg by mouth daily.          Broughton, Reinette Cuneo Danielle 10/01/2011,12:19 PM

## 2011-10-01 NOTE — ED Notes (Signed)
Pt states he is having no pain right now

## 2011-10-01 NOTE — Progress Notes (Signed)
Pharmacy Consult  - Heparin  PM Heparin level = 0.18 (goal = 0.3 to 0.7) No bleeding noted ACS / CP Stress test planned for AM  Plan: 1) Heparin 2000 units iv bolus x 1 2) Increase heparin to 1450 units / hr 3) Follow up AM heparin level  Thank you.  Okey Regal, PharmD

## 2011-10-01 NOTE — H&P (Signed)
Jimmy Green is an 61 y.o. male.   Chief Complaint: Recurrent chest pain HPI: Patient is 61 year old male with past medical history significant for coronary artery disease status post PTCA stenting to proximal LAD in the past hypertension hypercholesteremia history of tobacco abuse GERD depression positive family history of coronary artery disease came to the ER complaining of recurrent retrosternal chest pain off and on for last 3-4 days states he took 2 sublingual nitroglycerin 2 days ago with relief of chest pain again this morning while at work developed retrosternal chest pain described as tightness dull aching grade 8/10 associated with some numbness in the left hand while working her to sublingual nitroglycerin with relief of chest pain states the chest pain was associated with dizziness. Denies any palpitation or syncope denies any nausea vomiting diaphoresis denies any shortness of breath. Patient does give history of exertional chest pain relieved with rest and sublingual nitroglycerin. Denies any recent cardiac workup her. Patient had the cardiac Done in March of 2010 Her Which Showed Mild In-Stent Restenosis and Mild Proximal LAD and the Diagonal and RCA Stenosis.  Past Medical History  Diagnosis Date  . Hyperlipidemia   . Hypertension   . Coronary artery disease     Past Surgical History  Procedure Date  . Hernia repair   . Coronary stent placement     Family History  Problem Relation Age of Onset  . Cancer Father   . Heart disease Father    Social History:  reports that he has never smoked. He has never used smokeless tobacco. He reports that he does not drink alcohol. His drug history not on file.  Allergies: No Known Allergies  Medications Prior to Admission  Medication Dose Route Frequency Provider Last Rate Last Dose  . aspirin chewable tablet 243 mg  243 mg Oral Once Robynn Pane, MD   243 mg at 10/01/11 1002  . heparin ADULT infusion 100 units/mL (25000  units/250 mL)  1,250 Units/hr Intravenous To [facility], PHARMD      . heparin bolus via infusion 4,000 Units  4,000 Units Intravenous Once [facility], [place]      . nitroGLYCERIN 0.2 mg/mL in dextrose 5 % infusion  5 mcg/min Intravenous Titrated Jethro Bastos, NP 1.5 mL/hr at 10/01/11 1209 5 mcg/min at 10/01/11 1209  . DISCONTD: nitroGLYCERIN (NITROSTAT) 0.4 MG SL tablet            Medications Prior to Admission  Medication Sig Dispense Refill  . amLODipine-olmesartan (AZOR) 5-40 MG per tablet Take 1 tablet by mouth daily.        [name] atorvastatin (LIPITOR) 20 MG tablet Take 20 mg by mouth daily.        [name] escitalopram (LEXAPRO) 10 MG tablet Take 10 mg by mouth daily.        . metoprolol (TOPROL-XL) 50 MG 24 hr tablet Take 50 mg by mouth daily.        [name] omeprazole (PRILOSEC) 20 MG capsule Take 20 mg by mouth daily.          Results for orders placed during the hospital encounter of 10/01/11 (from the past 48 hour(s))  CBC     Status: Abnormal   Collection Time   10/01/11  9:38 AM      Component Value Range Comment   WBC 12.3 (*) 4.0 - 10.5 (K/uL)    RBC 4.85  4.22 - 5.81 (MIL/uL)    Hemoglobin 15.7  13.0 -  17.0 (g/dL)    HCT 16.1  09.6 - 04.5 (%)    MCV 92.6  78.0 - 100.0 (fL)    MCH 32.4  26.0 - 34.0 (pg)    MCHC 35.0  30.0 - 36.0 (g/dL)    RDW 40.9  81.1 - 91.4 (%)    Platelets 259  150 - 400 (K/uL)   DIFFERENTIAL     Status: Abnormal   Collection Time   10/01/11  9:38 AM      Component Value Range Comment   Neutrophils Relative 34 (*) 43 - 77 (%)    Lymphocytes Relative 56 (*) 12 - 46 (%)    Monocytes Relative 6  3 - 12 (%)    Eosinophils Relative 3  0 - 5 (%)    Basophils Relative 1  0 - 1 (%)    Neutro Abs 4.2  1.7 - 7.7 (K/uL)    Lymphs Abs 6.9 (*) 0.7 - 4.0 (K/uL)    Monocytes Absolute 0.7  0.1 - 1.0 (K/uL)    Eosinophils Absolute 0.4  0.0 - 0.7 (K/uL)    Basophils Absolute 0.1  0.0 - 0.1 (K/uL)    WBC Morphology ATYPICAL LYMPHOCYTES       BASIC METABOLIC PANEL     Status: Abnormal   Collection Time   10/01/11  9:38 AM      Component Value Range Comment   Sodium 137  135 - 145 (mEq/L)    Potassium 3.9  3.5 - 5.1 (mEq/L)    Chloride 105  96 - 112 (mEq/L)    CO2 25  19 - 32 (mEq/L)    Glucose, Bld 117 (*) 70 - 99 (mg/dL)    BUN 12  6 - 23 (mg/dL)    Creatinine, Ser 7.82  0.50 - 1.35 (mg/dL)    Calcium 9.9  8.4 - 10.5 (mg/dL)    GFR calc non Af Amer >90  >90 (mL/min)    GFR calc Af Amer >90  >90 (mL/min)   POCT I-STAT TROPONIN I     Status: Normal   Collection Time   10/01/11 10:00 AM      Component Value Range Comment   Troponin i, poc 0.00  0.00 - 0.08 (ng/mL)    Comment 3            URINALYSIS, ROUTINE W REFLEX MICROSCOPIC     Status: Normal   Collection Time   10/01/11 10:55 AM      Component Value Range Comment   Color, Urine YELLOW  YELLOW     APPearance CLEAR  CLEAR     Specific Gravity, Urine 1.022  1.005 - 1.030     pH 5.5  5.0 - 8.0     Glucose, UA NEGATIVE  NEGATIVE (mg/dL)    Hgb urine dipstick NEGATIVE  NEGATIVE     Bilirubin Urine NEGATIVE  NEGATIVE     Ketones, ur NEGATIVE  NEGATIVE (mg/dL)    Protein, ur NEGATIVE  NEGATIVE (mg/dL)    Urobilinogen, UA 1.0  0.0 - 1.0 (mg/dL)    Nitrite NEGATIVE  NEGATIVE     Leukocytes, UA NEGATIVE  NEGATIVE  MICROSCOPIC NOT DONE ON URINES WITH NEGATIVE PROTEIN, BLOOD, LEUKOCYTES, NITRITE, OR GLUCOSE <1000 mg/dL.   No results found.  Review of Systems  Constitutional: Negative for fever and chills.  HENT: Negative for hearing loss and neck pain.   Respiratory: Negative for cough, hemoptysis and sputum production.   Cardiovascular: Positive for chest pain. Negative  for palpitations, orthopnea, claudication, leg swelling and PND.  Gastrointestinal: Negative for heartburn, nausea, vomiting and abdominal pain.  Musculoskeletal: Negative for myalgias.  Skin: Negative for itching and rash.  Neurological: Positive for dizziness. Negative for headaches.    Blood  pressure 129/78, pulse 69, temperature 97.7 F (36.5 C), SpO2 99.00%. Physical Exam  Constitutional: He is oriented to person, place, and time. He appears well-developed and well-nourished.  HENT:  Head: Normocephalic.  Mouth/Throat: No oropharyngeal exudate.  Eyes: Conjunctivae are normal. Pupils are equal, round, and reactive to light. Left eye exhibits no discharge. No scleral icterus.  Neck: Neck supple. No JVD present. No tracheal deviation present.  Cardiovascular: Normal rate and regular rhythm.  Exam reveals no gallop and no friction rub.   Murmur (Soft systolic murmur noted) heard. Respiratory: Effort normal and breath sounds normal. No respiratory distress. He has no wheezes. He has no rales. He exhibits no tenderness.  GI: Soft. Bowel sounds are normal. He exhibits no distension and no mass. There is no tenderness. There is no rebound and no guarding.  Musculoskeletal: He exhibits no edema and no tenderness.  Lymphadenopathy:    He has no cervical adenopathy.  Neurological: He is alert and oriented to person, place, and time.     Assessment/Plan Unstable angina rule out myocardial infarction Coronary artery disease status post PCI to LAD in the past Hypertension Hypercholesteremia History of tobacco abuse GERD Depression Positive family history of coronary artery disease Plan Rule out MI protocol Continue home meds Plan IV heparin and nitrates Schedule for nuclear stress test in a.m. Robynn Pane 10/01/2011, 12:49 PM

## 2011-10-01 NOTE — ED Provider Notes (Signed)
Medical screening examination/treatment/procedure(s) were conducted as a shared visit with non-physician practitioner(s) and myself.  I personally evaluated the patient during the encounter  Pt resting comfortably, pt has been seen by Dr. Sharyn Lull as well who will admit- heparin and nitroglycerin ordered.    Ethelda Chick, MD 10/01/11 [phone]

## 2011-10-01 NOTE — ED Notes (Signed)
Started to feel bad x 4 days ago took a nitro this am   and it quit  Has had left rm pain

## 2011-10-01 NOTE — ED Notes (Signed)
Pt states that for the past 4-5 days he has been having left sided chest pain with radiation down into his left arm. He states that the fingers on the left hand have been feeling very tingly. Pt took an 81mg  asa this morning along with a few sl nitro's. Pt states that his pain was 8/10 at the time he took his nitro and the pain was relieved completely. He went to work anyway and states that today he became very weak and dizzy and his pain was severe. He went to ucc and was then sent down for further evaluation. Pt has hx of cad with angina and has had multiple stents placed. Alert and oriented. Breath sounds are clear and bowel sounds are present. Iv started and labs obtained.

## 2011-10-02 ENCOUNTER — Other Ambulatory Visit: Payer: Self-pay

## 2011-10-02 ENCOUNTER — Inpatient Hospital Stay (HOSPITAL_COMMUNITY): Payer: 59

## 2011-10-02 LAB — BASIC METABOLIC PANEL
BUN: 9 mg/dL (ref 6–23)
CO2: 26 mEq/L (ref 19–32)
Calcium: 9.3 mg/dL (ref 8.4–10.5)
GFR calc non Af Amer: >90 mL/min (ref >90)
Glucose, Bld: 102 mg/dL — ABNORMAL HIGH (ref 70–99)

## 2011-10-02 LAB — LIPID PANEL
HDL: 37 mg/dL — ABNORMAL LOW (ref >39)
LDL Cholesterol: 73 mg/dL (ref 0–99)
Triglycerides: 128 mg/dL (ref <150)
VLDL: 26 mg/dL (ref 0–40)

## 2011-10-02 LAB — CARDIAC PANEL(CRET KIN+CKTOT+MB+TROPI)
CK, MB: 2.2 ng/mL (ref 0.3–4.0)
Relative Index: INVALID (ref 0.0–2.5)
Troponin I: <0.3 ng/mL (ref <0.30)

## 2011-10-02 LAB — CBC
HCT: 40.3 % (ref 39.0–52.0)
Hemoglobin: 13.3 g/dL (ref 13.0–17.0)
MCH: 30.8 pg (ref 26.0–34.0)
MCHC: 33 g/dL (ref 30.0–36.0)
RBC: 4.32 MIL/uL (ref 4.22–5.81)

## 2011-10-02 MED ORDER — TECHNETIUM TC 99M TETROFOSMIN IV KIT
10.0000 | PACK | Freq: Once | INTRAVENOUS | Status: AC | PRN
  Administered 2011-10-02: 10 via INTRAVENOUS

## 2011-10-02 MED ORDER — OXYCODONE-ACETAMINOPHEN 5-325 MG PO TABS: 1.0000 | ORAL_TABLET | Freq: Four times a day (QID) | ORAL | Status: DC | PRN

## 2011-10-02 MED ORDER — TECHNETIUM TC 99M TETROFOSMIN IV KIT
30.0000 | PACK | Freq: Once | INTRAVENOUS | Status: AC | PRN
  Administered 2011-10-02: 30 via INTRAVENOUS

## 2011-10-02 MED ORDER — OXYCODONE-ACETAMINOPHEN 5-325 MG PO TABS
1.0000 | ORAL_TABLET | Freq: Four times a day (QID) | ORAL | Status: DC | PRN
  Administered 2011-10-02: 2 via ORAL
  Filled 2011-10-02: qty 2

## 2011-10-02 MED ORDER — NITROGLYCERIN 0.4 MG SL SUBL: 0.4000 mg | SUBLINGUAL_TABLET | SUBLINGUAL | Status: DC | PRN

## 2011-10-02 MED ORDER — REGADENOSON 0.4 MG/5ML IV SOLN
0.4000 mg | Freq: Once | INTRAVENOUS | Status: AC
  Administered 2011-10-02: 0.4 mg via INTRAVENOUS

## 2011-10-02 NOTE — Progress Notes (Signed)
Pt returned from stress test, c/o 6/10 CP; NTG gtt was d/c, prior to pt coming back; MD made aware; ordered received for percocet; currently pt is pain free, will continue to monitor

## 2011-10-02 NOTE — Progress Notes (Addendum)
ANTICOAGULATION CONSULT NOTE - Follow Up Consult  Pharmacy Consult for heparin Indication: chest pain/ACS  Labs:  Basename 10/02/11 0545 10/02/11 0124 10/01/11 1934 10/01/11 1605 10/01/11 1332 10/01/11 1327 10/01/11 0938  HGB 13.3 -- -- 14.2 -- -- --  HCT 40.3 -- -- 41.3 -- -- 44.9  PLT 231 -- -- 252 -- -- 259  APTT -- -- -- -- -- -- --  LABPROT -- -- -- -- -- 14.1 --  INR -- -- -- -- -- 1.07 --  HEPARINUNFRC 0.28* -- 0.18* -- -- -- --  CREATININE -- -- -- -- -- 0.55 0.54  CKTOTAL -- 48 53 -- 72 -- --  CKMB -- 2.2 2.3 -- 2.7 -- --  TROPONINI -- <0.30 <0.30 -- <0.30 -- --   Assessment: 61yo male remains slightly subtherapeutic on heparin after rate increase.  Goal of Therapy:  Heparin level 0.3-0.7 units/ml   Plan:  Will increase heparin gtt by ~1 unit/kg/hr to 1600 units/hr Follow up heparin level in AM  Okey Regal, PharmD

## 2011-10-02 NOTE — Progress Notes (Signed)
UR Completed. Simmons, Zahli Vetsch F [phone]  

## 2011-10-02 NOTE — Progress Notes (Addendum)
Pt c/o headache from NTG gtt; MD called and made aware; pt also states that he's been having 5/10 CP, that the NTG gtt has not kept the pain away; EKG obtained, no changes noted from earlier this AM. Pt does not appear in distress. MD aware, said to continue NTG gtt and ok for pt to go down to [facility]; tylenol given for headache

## 2011-10-02 NOTE — Progress Notes (Signed)
Discharge review done with patient.   Patient acknowledged understanding of information provided.  Prescriptions given per MD's order, paatient is stable and driving self home. Ephriam Knuckles

## 2011-10-02 NOTE — Discharge Summary (Signed)
  Discharge summary dictated on 10/02/2011 dictation number is [phone]

## 2011-10-03 NOTE — Discharge Summary (Signed)
Jimmy Green, SETO NO.:  [id number]  MEDICAL RECORD NO.:  [id number]  LOCATION:  3738                         FACILITY:  [facility]  PHYSICIAN:  Jianna Drabik N. Sharyn Lull, M.D. DATE OF BIRTH:  1951-02-09  DATE OF ADMISSION:  10/01/2011 DATE OF DISCHARGE:  10/02/2011                              DISCHARGE SUMMARY   ADMITTING DIAGNOSES: 1. Unstable angina rule out myocardial infarction. 2. Coronary artery disease status post percutaneous coronary     intervention to left anterior descending coronary artery in the     past. 3. Hypertension. 4. Hypercholesteremia. 5. History of tobacco abuse. 6. Gastroesophageal reflux disease. 7. Depression. 8. Positive family history of coronary artery disease.  FINAL DIAGNOSES: 1. Stable angina, negative Lexiscan scan Myoview. 2. Coronary artery disease, status post percutaneous coronary     intervention to left anterior descending coronary artery in the     past. 3. Hypertension. 4. Hypercholesteremia. 5. History of tobacco abuse. 6. Gastroesophageal reflux disease. 7. Depression. 8. Positive family history of coronary artery disease.  DISCHARGE HOME MEDICATIONS: 1. Enteric-coated aspirin 81 mg 1 tablet daily. 2. Atorvastatin 20 mg 1 tablet daily. 3. Azor 540 one tablet daily. 4. Metoprolol succinate 50 mg 1 tablet daily. 5. Omeprazole 20 mg 1 capsule daily. 6. Lexapro 10 mg 1 tablet daily. 7. Nitrostat 0.4 mg sublingual use as directed. 8. Percocet 1-2 tablets every 6 hours as needed for musculoskeletal     pain.  DIET:  Low-salt, low-cholesterol.  ACTIVITY:  As tolerated.  CONDITION ON DISCHARGE:  Stable.  FOLLOWUP:  With me in 1 week.  BRIEF HISTORY AND HOSPITAL COURSE:  Jimmy Green is 61 year old male with past medical history significant for coronary artery disease status post PTCA stenting to LAD in the past, hypertension, hypercholesteremia, history of tobacco abuse, GERD, depression, positive family history  of coronary artery disease, he came to ER complaining of recurrent retrosternal chest pain off and on for last 3-4 days, states he took 2 sublingual nitro 2 days ago with relief of chest pain.  Again this morning while at work, developed retrosternal chest pain described as tightness, dull aching, grade 8/10 associated with some numbness in left hand while working, took sublingual nitro with relief of chest pain. States chest pain was associated with dizziness.  Denies any palpitation, lightheadedness, or syncope.  Denies nausea, vomiting, diaphoresis.  Denies any shortness of breath.  The patient does give history of exertional chest pain relieved with rest and sublingual nitro.  Denies any recent cardiac workup.  The patient had cardiac cath done in March of 2010, which showed mild in-stent restenosis and mild proximal LAD diagonal and RCA stenosis.  PAST MEDICAL HISTORY:  As above.  PAST SURGICAL HISTORY:  He had hernia repair, also had PTCA stenting.  PHYSICAL EXAMINATION:  GENERAL:  He was alert, awake, oriented x3. VITAL SIGNS:  Blood pressure was 129/78, pulse was 69.  He was afebrile. EYES:  Conjunctiva was pink. NECK:  Supple.  No JVD.  No bruit. LUNGS:  Clear to auscultation without rhonchi, rales.  CARDIOVASCULAR: S1, S2 was normal.  There is soft systolic murmur.  No S3 gallop. ABDOMEN:  Soft.  Bowel sounds are  present, nontender. EXTREMITIES:  There is no clubbing, cyanosis, or edema.  LABORATORY DATA:  His sodium was 137, potassium 3.9, BUN 12, creatinine 0.54, glucose was 117.  Repeat fasting sugar was 95.  His 3 sets of cardiac enzymes were negative.  Cholesterol was 136, LDL of 73, HDL was slightly low at 37, triglycerides were 128, hemoglobin was 15.7, hematocrit 44.9, white count of 12.3, with no shift to the left.  His admission EKG showed normal sinus rhythm with diffuse nonspecific T-wave changes.  Lexiscan Myoview showed no evidence of ischemia or  infarction with EF of 47%.  BRIEF HOSPITAL COURSE:  The patient was admitted to telemetry unit.  MI was ruled out by serial enzymes and EKG.  The patient subsequently underwent Lexiscan  Myoview which showed no evidence of ischemia or infarction with EF of 47% which has reduced from 55% from prior stress test.  The patient did not have any further anginal chest pain during the hospital stay.  The patient will be discharged home on above medications and will be followed up in my office in 1 week.     Jimmy Green. Sharyn Lull, M.D.     MNH/MEDQ  D:  10/02/2011  T:  10/03/2011  Job:  [id number]

## 2011-10-07 ENCOUNTER — Encounter (HOSPITAL_COMMUNITY): Payer: Self-pay | Admitting: Pharmacy Technician

## 2011-10-08 ENCOUNTER — Ambulatory Visit (HOSPITAL_COMMUNITY)
Admission: RE | Admit: 2011-10-08 | Discharge: 2011-10-08 | Disposition: A | Discharge status: Home / Self Care | Payer: 59 | Source: Ambulatory Visit | Attending: Cardiology | Admitting: Cardiology

## 2011-10-08 ENCOUNTER — Other Ambulatory Visit: Payer: Self-pay

## 2011-10-08 ENCOUNTER — Encounter (HOSPITAL_COMMUNITY)
Admission: RE | Disposition: A | Discharge status: Home / Self Care | Payer: Self-pay | Source: Ambulatory Visit | Attending: Cardiology

## 2011-10-08 DIAGNOSIS — I2510 Atherosclerotic heart disease of native coronary artery without angina pectoris: Secondary | ICD-10-CM | POA: Insufficient documentation

## 2011-10-08 DIAGNOSIS — E7800 Pure hypercholesterolemia, unspecified: Secondary | ICD-10-CM | POA: Insufficient documentation

## 2011-10-08 DIAGNOSIS — Z87891 Personal history of nicotine dependence: Secondary | ICD-10-CM | POA: Insufficient documentation

## 2011-10-08 DIAGNOSIS — K219 Gastro-esophageal reflux disease without esophagitis: Secondary | ICD-10-CM | POA: Insufficient documentation

## 2011-10-08 DIAGNOSIS — Z9861 Coronary angioplasty status: Secondary | ICD-10-CM | POA: Insufficient documentation

## 2011-10-08 DIAGNOSIS — I10 Essential (primary) hypertension: Secondary | ICD-10-CM | POA: Insufficient documentation

## 2011-10-08 HISTORY — PX: FRACTIONAL FLOW RESERVE WIRE: SHX5839

## 2011-10-08 HISTORY — PX: LEFT HEART CATHETERIZATION WITH CORONARY ANGIOGRAM: SHX5451

## 2011-10-08 SURGERY — LEFT HEART CATHETERIZATION WITH CORONARY ANGIOGRAM
Anesthesia: LOCAL

## 2011-10-08 MED ORDER — SODIUM CHLORIDE 0.9 % IV SOLN
INTRAVENOUS | Status: DC
  Administered 2011-10-08: 1000 mL via INTRAVENOUS

## 2011-10-08 MED ORDER — METOPROLOL SUCCINATE ER 50 MG PO TB24: 50.0000 mg | ORAL_TABLET | Freq: Every evening | ORAL | Status: DC

## 2011-10-08 MED ORDER — ACETAMINOPHEN 500 MG PO TABS
ORAL_TABLET | ORAL | Status: AC
  Filled 2011-10-08: qty 2

## 2011-10-08 MED ORDER — SODIUM CHLORIDE 0.9 % IJ SOLN: 3.0000 mL | Freq: Two times a day (BID) | INTRAMUSCULAR | Status: DC

## 2011-10-08 MED ORDER — ESCITALOPRAM OXALATE 10 MG PO TABS: 10.0000 mg | ORAL_TABLET | Freq: Every evening | ORAL | Status: DC

## 2011-10-08 MED ORDER — NITROGLYCERIN 0.2 MG/ML ON CALL CATH LAB
INTRAVENOUS | Status: AC
  Filled 2011-10-08: qty 1

## 2011-10-08 MED ORDER — SODIUM CHLORIDE 0.9 % IV SOLN: 250.0000 mL | INTRAVENOUS | Status: DC | PRN

## 2011-10-08 MED ORDER — HEPARIN (PORCINE) IN NACL 2-0.9 UNIT/ML-% IJ SOLN
INTRAMUSCULAR | Status: AC
  Filled 2011-10-08: qty 2000

## 2011-10-08 MED ORDER — TICAGRELOR 90 MG PO TABS
ORAL_TABLET | ORAL | Status: AC
  Filled 2011-10-08: qty 2

## 2011-10-08 MED ORDER — MIDAZOLAM HCL 2 MG/2ML IJ SOLN
INTRAMUSCULAR | Status: AC
  Filled 2011-10-08: qty 2

## 2011-10-08 MED ORDER — ACETAMINOPHEN 325 MG PO TABS
650.0000 mg | ORAL_TABLET | ORAL | Status: DC | PRN
  Administered 2011-10-08: 500 mg via ORAL

## 2011-10-08 MED ORDER — LIDOCAINE HCL (PF) 1 % IJ SOLN
INTRAMUSCULAR | Status: AC
  Filled 2011-10-08: qty 30

## 2011-10-08 MED ORDER — OXYCODONE-ACETAMINOPHEN 5-325 MG PO TABS: 1.0000 | ORAL_TABLET | Freq: Four times a day (QID) | ORAL | Status: DC | PRN

## 2011-10-08 MED ORDER — BIVALIRUDIN 250 MG IV SOLR
INTRAVENOUS | Status: AC
  Filled 2011-10-08: qty 250

## 2011-10-08 MED ORDER — SODIUM CHLORIDE 0.9 % IV SOLN: INTRAVENOUS | Status: AC

## 2011-10-08 MED ORDER — ONDANSETRON HCL 4 MG/2ML IJ SOLN: 4.0000 mg | Freq: Four times a day (QID) | INTRAMUSCULAR | Status: DC | PRN

## 2011-10-08 MED ORDER — ASPIRIN EC 81 MG PO TBEC: 81.0000 mg | DELAYED_RELEASE_TABLET | Freq: Every morning | ORAL | Status: DC

## 2011-10-08 MED ORDER — FENTANYL CITRATE 0.05 MG/ML IJ SOLN
INTRAMUSCULAR | Status: AC
  Filled 2011-10-08: qty 2

## 2011-10-08 MED ORDER — PANTOPRAZOLE SODIUM 40 MG PO TBEC: 40.0000 mg | DELAYED_RELEASE_TABLET | Freq: Every day | ORAL | Status: DC

## 2011-10-08 MED ORDER — SODIUM CHLORIDE 0.9 % IJ SOLN: 3.0000 mL | INTRAMUSCULAR | Status: DC | PRN

## 2011-10-08 MED ORDER — DIAZEPAM 5 MG PO TABS
5.0000 mg | ORAL_TABLET | ORAL | Status: AC
  Administered 2011-10-08: 5 mg via ORAL
  Filled 2011-10-08: qty 1

## 2011-10-08 MED ORDER — ROSUVASTATIN CALCIUM 20 MG PO TABS: 20.0000 mg | ORAL_TABLET | Freq: Every day | ORAL | Status: DC

## 2011-10-08 MED ORDER — ASPIRIN 81 MG PO CHEW
324.0000 mg | CHEWABLE_TABLET | ORAL | Status: AC
  Administered 2011-10-08: 324 mg via ORAL
  Filled 2011-10-08: qty 4

## 2011-10-08 MED ORDER — ADENOSINE 12 MG/4ML IV SOLN
16.0000 mL | Freq: Once | INTRAVENOUS | Status: DC
  Filled 2011-10-08: qty 16

## 2011-10-08 NOTE — Discharge Instructions (Signed)
Coronary Angiography Coronary angiography is an X-ray procedure used to look at the arteries in the heart. In this procedure, a dye is injected through a long, hollow tube (catheter). The catheter is about the size of a piece of cooked spaghetti. The catheter injects a dye into an artery in your groin. X-rays are then taken to show if there is a blockage in the arteries of your heart. BEFORE THE PROCEDURE   Let your caregiver know if you have allergies to shellfish or contrast dye. Also let your caregiver know if you have kidney problems or failure.   Do not eat or drink starting from midnight up to the time of the procedure, or as directed.   You may drink enough water to take your medications the morning of the procedure if you were instructed to do so.   You should be at the hospital or outpatient facility where the procedure is to be done 60 minutes prior to the procedure or as directed.  PROCEDURE  You may be given an IV medication to help you relax before the procedure.   You will be prepared for the procedure by washing and shaving the area where the catheter will be inserted. This is usually done in the groin but may be done in the fold of your arm by your elbow.   A medicine will be given to numb your groin where the catheter will be inserted.   A specially trained doctor will insert the catheter into an artery in your groin. The catheter is guided by using a special type of X-ray (fluoroscopy) to the blood vessel being examined.   A special dye is then injected into the catheter and X-rays are taken. The dye helps to show where any narrowing or blockages are located in the heart arteries.  AFTER THE PROCEDURE   After the procedure you will be kept in bed lying flat for several hours. You will be instructed to not bend or cross your legs.   The groin insertion site will be watched and checked frequently.   The pulse in your feet will be checked frequently.   Additional blood  tests, X-rays and an EKG may be done.   You may stay in the hospital overnight for observation.  SEEK IMMEDIATE MEDICAL CARE IF:   You develop chest pain, shortness of breath, feel faint, or pass out.   There is bleeding, swelling, or drainage from the catheter insertion site.   You develop pain, discoloration, coldness, or severe bruising in the leg or area where the catheter was inserted.   You have a fever.  Document Released: 01/24/2003 Document Revised: 07/09/2011 Document Reviewed: 03/14/2008 ExitCare Patient Information 2012 ExitCare, LLCGroin Site Care Refer to this sheet in the next few weeks. These instructions provide you with information on caring for yourself after your procedure. Your caregiver may also give you more specific instructions. Your treatment has been planned according to current medical practices, but problems sometimes occur. Call your caregiver if you have any problems or questions after your procedure. HOME CARE INSTRUCTIONS  You may shower 24 hours after the procedure. Remove the bandage (dressing) and gently wash the site with plain soap and water. Gently pat the site dry.   Do not apply powder or lotion to the site.   Do not sit in a bathtub, swimming pool, or whirlpool for 5 to 7 days.   No bending, squatting, or lifting anything over 10 pounds (4.5 kg) as directed by your caregiver.  Inspect the site at least twice daily.   Do not drive home if you are discharged the same day of the procedure. Have someone else drive you.   You may drive 24 hours after the procedure unless otherwise instructed by your caregiver.  What to expect:  Any bruising will usually fade within 1 to 2 weeks.   Blood that collects in the tissue (hematoma) may be painful to the touch. It should usually decrease in size and tenderness within 1 to 2 weeks.  SEEK IMMEDIATE MEDICAL CARE IF:  You have unusual pain at the groin site or down the affected leg.   You have  redness, warmth, swelling, or pain at the groin site.   You have drainage (other than a small amount of blood on the dressing).   You have chills.   You have a fever or persistent symptoms for more than 72 hours.   You have a fever and your symptoms suddenly get worse.   Your leg becomes pale, cool, tingly, or numb.   You have heavy bleeding from the site. Hold pressure on the site.  Document Released: 08/22/2010 Document Revised: 07/09/2011 Document Reviewed: 08/22/2010 [employer] Patient Information 2012 [place], [place].[name]

## 2011-10-08 NOTE — CV Procedure (Signed)
Cardiac cath report dictated on 10/08/2011 dictation number is [id number]

## 2011-10-08 NOTE — Cardiovascular Report (Signed)
NAMEDEWAIN, PLATZ NO.:  [id number]  MEDICAL RECORD NO.:  [id number]  LOCATION:  [facility]                         FACILITY:  [facility]  PHYSICIAN:  Kirat Mezquita N. Sharyn Lull, M.D. DATE OF BIRTH:  1950/11/06  DATE OF PROCEDURE:  10/08/2011 DATE OF DISCHARGE:                           CARDIAC CATHETERIZATION   PROCEDURE: 1. Left cardiac cath with selective left and right coronary     angiography, left ventriculography via right groin using Judkins     technique. 2. Measurement of fractional flow reserve using volcano catheter.  INDICATION FOR THE PROCEDURE:  Mr. Serpe is a 61 year old white male with past medical history significant for coronary artery disease, status post PTCA stenting to LAD in the past, hypertension, hypercholesteremia, history of tobacco abuse, GERD, depression positive family history of coronary artery disease.  He recently discharged from the hospital, came to office complaining of recurrent exertional chest pain, retrosternal and left-sided, radiating to the left arm, relieved with rest and sublingual nitro while at work.  He denies any nausea, vomiting, or diaphoresis.  He states chest pain feels similar in nature when he had PCI to LAD.  Denies any palpitation, lightheadedness, or syncope.  Denies PND, orthopnea, or leg swelling.  The patient recently had [facility], which was negative for ischemia.  Due to recurrent typical anginal chest pain, I discussed with the patient regarding left cath, possible PTCA stenting, its risks and benefits, i.e., death, MI, stroke, need for emergency CABG, risk of restenosis, local vascular complications, etc. versus medical management as the patient had negative recent Lexiscan Myoview.  The patient states he and his family is very worried that he may get heart attack and die and wanted to proceed with PCI.  PROCEDURE:  After obtaining the informed consent, the patient was brought to the cath lab and was  placed on fluoroscopy table.  Right groin was prepped and draped in usual fashion.  Xylocaine 1% was used for local anesthesia in the right groin.  With the help of thin-wall needle, a 5-French arterial sheath was placed.  The sheath was aspirated and flushed.  A 5-French left Judkins catheter was advanced over the wire under fluoroscopic guidance up to the ascending aorta.  Wire was pulled out. The catheter was aspirated and connected to the Manifold.  Catheter was further advanced and engaged into left coronary ostium.  Multiple views of the left system were taken.  Catheter was disengaged and was pulled out over the wire and was replaced with 5-French right Judkins catheter, which was advanced over the wire under fluoroscopic guidance up to the ascending aorta.  Wire was pulled out.  The catheter was aspirated and connected to the Manifold.  Catheter was further advanced and engaged into right coronary ostium.  Multiple views of the right system were taken.  Catheter was disengaged and was pulled out over the wire and was replaced with a 5-French pigtail catheter, which was advanced over the wire under fluoroscopic guidance up to the ascending aorta.  Wire was pulled out.  The catheter was aspirated and connected to the Manifold. Catheter was further advanced across the aortic valve into the LV.  LV pressures were recorded.  LV  graft was done in 30-degree RAO position.  Post-angiographic pressures were recorded from LV and then pullback pressures were recorded from the aorta.  There was no gradient across the aortic valve.  Pigtail catheter was pulled out over the wire.  Sheaths were aspirated and flushed.  FINDINGS:  LV showed good LV systolic function, EF of 50-55%.  Left main was short, which was patent.  LAD has 50-60% proximal stenosis at the proximal edge of the stent with haziness with TIMI grade 3 distal flow. Diagonal 1 was moderate size, which had mild disease.   Diagonal 2 was very small.  Left circumflex was patent.  OM 1 and OM 2 were very small. OM 3 and OM 4 were small, which were patent.  RCA had 10-20% proximal stenosis and 5-10% distal stenosis prior to bifurcation with PDA.  PDA was very small.  PLV branch was moderate size, which had mild disease.  Fractional flow reserve was done in proximal LAD using volcano catheter, which was 0.84, which was not suggestive of physiologically significant stenosis.  The patient received weight-based Angiomax and 180 mg of Brilinta prior to fractional flow reserve.  The patient tolerated procedure well.  There were no complications.  The patient was transferred to recovery room in stable condition.     Eduardo Osier. Sharyn Lull, M.D.     MNH/MEDQ  D:  10/08/2011  T:  10/08/2011  Job:  [id number]

## 2011-10-09 MED FILL — Dextrose Inj 5%: INTRAVENOUS | Qty: 50 | Status: AC

## 2012-02-26 ENCOUNTER — Other Ambulatory Visit: Payer: Self-pay | Admitting: Cardiology

## 2012-08-03 DIAGNOSIS — C4490 Unspecified malignant neoplasm of skin, unspecified: Secondary | ICD-10-CM

## 2012-08-03 HISTORY — DX: Unspecified malignant neoplasm of skin, unspecified: C44.90

## 2012-09-28 ENCOUNTER — Other Ambulatory Visit (HOSPITAL_COMMUNITY): Payer: Self-pay | Admitting: Cardiology

## 2012-09-28 DIAGNOSIS — R079 Chest pain, unspecified: Secondary | ICD-10-CM

## 2012-10-05 ENCOUNTER — Encounter (HOSPITAL_COMMUNITY): Payer: 59

## 2013-10-09 ENCOUNTER — Other Ambulatory Visit (HOSPITAL_COMMUNITY): Payer: Self-pay | Admitting: Cardiology

## 2013-10-09 DIAGNOSIS — R079 Chest pain, unspecified: Secondary | ICD-10-CM

## 2013-10-18 ENCOUNTER — Ambulatory Visit (HOSPITAL_COMMUNITY)
Admission: RE | Admit: 2013-10-18 | Discharge: 2013-10-18 | Disposition: A | Discharge status: Home / Self Care | Payer: No Typology Code available for payment source | Source: Ambulatory Visit | Attending: Cardiology | Admitting: Cardiology

## 2013-10-18 ENCOUNTER — Other Ambulatory Visit: Payer: Self-pay

## 2013-10-18 ENCOUNTER — Encounter (HOSPITAL_COMMUNITY)
Admission: RE | Admit: 2013-10-18 | Discharge: 2013-10-18 | Disposition: A | Discharge status: Home / Self Care | Payer: No Typology Code available for payment source | Source: Ambulatory Visit | Attending: Cardiology | Admitting: Cardiology

## 2013-10-18 DIAGNOSIS — I10 Essential (primary) hypertension: Secondary | ICD-10-CM | POA: Insufficient documentation

## 2013-10-18 DIAGNOSIS — I2510 Atherosclerotic heart disease of native coronary artery without angina pectoris: Secondary | ICD-10-CM | POA: Insufficient documentation

## 2013-10-18 DIAGNOSIS — R079 Chest pain, unspecified: Secondary | ICD-10-CM

## 2013-10-18 DIAGNOSIS — E785 Hyperlipidemia, unspecified: Secondary | ICD-10-CM | POA: Insufficient documentation

## 2013-10-18 LAB — BASIC METABOLIC PANEL
BUN: 11 mg/dL (ref 6–23)
CALCIUM: 9.5 mg/dL (ref 8.4–10.5)
CO2: 24 meq/L (ref 19–32)
CREATININE: 0.51 mg/dL (ref 0.50–1.35)
Chloride: 103 mEq/L (ref 96–112)
GFR calc Af Amer: >90 mL/min (ref >90)
GFR calc non Af Amer: >90 mL/min (ref >90)
GLUCOSE: 136 mg/dL — AB (ref 70–99)
Potassium: 4.1 mEq/L (ref 3.7–5.3)
Sodium: 140 mEq/L (ref 137–147)

## 2013-10-18 LAB — HEPATIC FUNCTION PANEL
ALT: 45 U/L (ref 0–53)
AST: 27 U/L (ref 0–37)
Albumin: 3.4 g/dL — ABNORMAL LOW (ref 3.5–5.2)
Alkaline Phosphatase: 89 U/L (ref 39–117)
Bilirubin, Direct: <0.2 mg/dL (ref 0.0–0.3)
Total Bilirubin: 0.8 mg/dL (ref 0.3–1.2)
Total Protein: 7.1 g/dL (ref 6.0–8.3)

## 2013-10-18 LAB — LIPID PANEL
Cholesterol: 155 mg/dL (ref 0–200)
HDL: 30 mg/dL — ABNORMAL LOW (ref >39)
LDL Cholesterol: 95 mg/dL (ref 0–99)
Total CHOL/HDL Ratio: 5.2 RATIO
Triglycerides: 150 mg/dL — ABNORMAL HIGH (ref <150)
VLDL: 30 mg/dL (ref 0–40)

## 2013-10-18 LAB — HEMOGLOBIN A1C
Hgb A1c MFr Bld: 6.2 % — ABNORMAL HIGH (ref <5.7)
Mean Plasma Glucose: 131 mg/dL — ABNORMAL HIGH (ref <117)

## 2013-10-18 MED ORDER — REGADENOSON 0.4 MG/5ML IV SOLN
0.4000 mg | Freq: Once | INTRAVENOUS | Status: AC
  Administered 2013-10-18: 0.4 mg via INTRAVENOUS

## 2013-10-18 MED ORDER — TECHNETIUM TC 99M SESTAMIBI GENERIC - CARDIOLITE
30.0000 | Freq: Once | INTRAVENOUS | Status: AC | PRN
  Administered 2013-10-18: 30 via INTRAVENOUS

## 2013-10-18 MED ORDER — TECHNETIUM TC 99M SESTAMIBI GENERIC - CARDIOLITE
10.0000 | Freq: Once | INTRAVENOUS | Status: AC | PRN
  Administered 2013-10-18: 10 via INTRAVENOUS

## 2013-10-18 MED ORDER — REGADENOSON 0.4 MG/5ML IV SOLN
INTRAVENOUS | Status: DC
Start: 2013-10-18 — End: 2013-10-24
  Filled 2013-10-18: qty 5

## 2014-07-12 ENCOUNTER — Encounter (HOSPITAL_COMMUNITY): Payer: Self-pay | Admitting: Cardiology

## 2014-08-21 ENCOUNTER — Encounter: Payer: Self-pay | Admitting: *Deleted

## 2014-08-29 ENCOUNTER — Encounter: Payer: Self-pay | Admitting: General Surgery

## 2014-08-29 NOTE — Progress Notes (Signed)
This encounter was created in error - please disregard.

## 2014-08-29 NOTE — Progress Notes (Deleted)
Patient ID: Jimmy Green, male   DOB: 1950/11/11, 64 y.o.   MRN: [id number]  Chief Complaint  Patient presents with  . Cyst    HPI Jimmy Green is a 64 y.o. male HPI  Past Medical History  Diagnosis Date  . Hyperlipidemia   . Hypertension   . Coronary artery disease   . Cancer 2014    skin    Past Surgical History  Procedure Laterality Date  . Hernia repair    . Coronary stent placement    . Left heart catheterization with coronary angiogram N/A 10/08/2011    Procedure: LEFT HEART CATHETERIZATION WITH CORONARY ANGIOGRAM;  Surgeon: Clent Demark, MD;  Location: [facility] CATH LAB;  Service: Cardiovascular;  Laterality: N/A;  . Fractional flow reserve wire  10/08/2011    Procedure: FRACTIONAL FLOW RESERVE WIRE;  Surgeon: Clent Demark, MD;  Location: [place] CATH LAB;  Service: Cardiovascular;;    Family History  Problem Relation Age of Onset  . Cancer Father   . Heart disease Father     Social History History  Substance Use Topics  . Smoking status: Current Some Day Smoker -- 40 years  . Smokeless tobacco: Never Used  . Alcohol Use: No    No Known Allergies  Current Outpatient Prescriptions  Medication Sig Dispense Refill  . amLODipine (NORVASC) 5 MG tablet Take 5 mg by mouth daily.    [name] aspirin EC 81 MG tablet Take 81 mg by mouth every morning.     [name] atorvastatin (LIPITOR) 20 MG tablet Take 20 mg by mouth every evening.     [name] buPROPion (WELLBUTRIN XL) 150 MG 24 hr tablet Take 150 mg by mouth daily.    [name] escitalopram (LEXAPRO) 10 MG tablet Take 10 mg by mouth every evening.     [name] losartan-hydrochlorothiazide (HYZAAR) 100-12.5 MG per tablet Take 1 tablet by mouth daily.    . metoprolol (TOPROL-XL) 50 MG 24 hr tablet Take 50 mg by mouth every evening.     . nitroGLYCERIN (NITROSTAT) 0.4 MG SL tablet Place 0.4 mg under the tongue every 5 (five) minutes as needed. For chest pain.    [name] omeprazole (PRILOSEC) 20 MG capsule Take 20 mg by mouth every morning.     [name]  oxyCODONE-acetaminophen (PERCOCET) 5-325 MG per tablet Take 1-2 tablets by mouth every 6 (six) hours as needed. For pain.     No current facility-administered medications for this visit.    Review of Systems Review of Systems    There were no vitals taken for this visit.  Physical Exam Physical Exam  Data Reviewed ***  Assessment    ***    Plan    ***       Carson Myrtle 08/29/2014, 2:39 PM

## 2014-09-06 ENCOUNTER — Ambulatory Visit: Payer: Self-pay | Admitting: General Surgery

## 2014-09-13 ENCOUNTER — Ambulatory Visit (INDEPENDENT_AMBULATORY_CARE_PROVIDER_SITE_OTHER): Payer: 59 | Admitting: General Surgery

## 2014-09-13 ENCOUNTER — Encounter: Payer: Self-pay | Admitting: General Surgery

## 2014-09-13 VITALS — BP 120/72 | HR 78 | Resp 14 | Ht 69.0 in | Wt 206.0 lb

## 2014-09-13 DIAGNOSIS — L723 Sebaceous cyst: Secondary | ICD-10-CM

## 2014-09-13 NOTE — Patient Instructions (Addendum)
Keep area clean and dry. You may shower but do not rub area. The dressing can be removed in two days. Return in 7 days for suture removal.

## 2014-09-13 NOTE — Progress Notes (Signed)
Patient ID: Jimmy Green, male   DOB: [birth year], 64 y.o.   MRN: [id number]  Chief Complaint  Patient presents with  . Other    cyst on left shoulder    HPI Jimmy Green is a 64 y.o. male here for assessment of sebaceous cyst on his left shoulder. He has had this for at least a year. About 6 months ago his wife helped to drain the area. Some white thick fluid came out. He reports the area has since gotten larger. About 1 week ago the area started causing discomfort reported as a stinging sensation that is constant. He has had no other treatment for this.   HPI  Past Medical History  Diagnosis Date  . Hyperlipidemia   . Hypertension   . Coronary artery disease   . Skin cancer 2014    nose and right knee    Past Surgical History  Procedure Laterality Date  . Hernia repair    . Coronary stent placement    . Left heart catheterization with coronary angiogram N/A 10/08/2011    Procedure: LEFT HEART CATHETERIZATION WITH CORONARY ANGIOGRAM;  Surgeon: Clent Demark, MD;  Location: [facility] CATH LAB;  Service: Cardiovascular;  Laterality: N/A;  . Fractional flow reserve wire  10/08/2011    Procedure: FRACTIONAL FLOW RESERVE WIRE;  Surgeon: Clent Demark, MD;  Location: [place] CATH LAB;  Service: Cardiovascular;;    Family History  Problem Relation Age of Onset  . Lung cancer Father   . Heart disease Father   . Skin cancer Father     Social History History  Substance Use Topics  . Smoking status: Former Smoker -- 64 years    Quit date: 08/03/2012  . Smokeless tobacco: Never Used  . Alcohol Use: No    No Known Allergies  Current Outpatient Prescriptions  Medication Sig Dispense Refill  . amLODipine (NORVASC) 5 MG tablet Take 5 mg by mouth daily.    [name] aspirin EC 81 MG tablet Take 81 mg by mouth every morning.     [name] atorvastatin (LIPITOR) 20 MG tablet Take 20 mg by mouth every evening.     [name] buPROPion (WELLBUTRIN XL) 150 MG 24 hr tablet Take 150 mg by mouth daily.    [name]  escitalopram (LEXAPRO) 10 MG tablet Take 10 mg by mouth every evening.     [name] losartan-hydrochlorothiazide (HYZAAR) 100-12.5 MG per tablet Take 1 tablet by mouth daily.    . metoprolol (TOPROL-XL) 50 MG 24 hr tablet Take 50 mg by mouth every evening.     . nitroGLYCERIN (NITROSTAT) 0.4 MG SL tablet Place 0.4 mg under the tongue every 5 (five) minutes as needed. For chest pain.    [name] omeprazole (PRILOSEC) 20 MG capsule Take 20 mg by mouth every morning.      No current facility-administered medications for this visit.    Review of Systems Review of Systems  Constitutional: Negative.   Respiratory: Negative.   Cardiovascular: Negative.     Blood pressure 120/72, pulse 78, resp. rate 14, height 5\' 9"  (1.753 m), weight 206 lb (93.441 kg).  Physical Exam Physical Exam  Constitutional: He is oriented to person, place, and time. He appears well-developed and well-nourished.  Eyes: Conjunctivae are normal. No scleral icterus.  Neck: Neck supple.  Cardiovascular: Normal rate, regular rhythm and normal heart sounds.   Pulmonary/Chest: Effort normal and breath sounds normal.    Lymphadenopathy:    He has no cervical adenopathy.    He  has no axillary adenopathy.  Neurological: He is alert and oriented to person, place, and time.      Assessment    Prominent skin nodule on the left posterior shoulder.    Plan    While the patient reports minimal change, this is significantly abnormal and it was elected to proceed to excision. 10 mL of 0.5% Xylocaine with 0.25% Marcaine with 1-200,000 of epinephrine was utilized well tolerated. ChloraPrep was applied to the skin. The area was excised in elliptical incision. The deep tissue was approximated with 3-0 Vicryl sutures. The skin was closed with 4-0 nylon suture. Telfa and Tegaderm dressing applied. The procedure was well tolerated.  The patient will make use of ice as well as Tylenol/Advil/Aleve for comfort. Follow up in one week for suture  removal.    Ref/PCP: Dr Duane Boston, Forest Gleason 09/14/2014, 9:07 PM

## 2014-09-14 DIAGNOSIS — L723 Sebaceous cyst: Secondary | ICD-10-CM | POA: Insufficient documentation

## 2014-09-19 ENCOUNTER — Telehealth: Payer: Self-pay | Admitting: *Deleted

## 2014-09-19 NOTE — Telephone Encounter (Signed)
-----   Message from Robert Bellow, MD sent at 09/19/2014 12:05 PM EST ----- Please notify the patient that the nodule removed from the left shoulder was indeed a skin cancer. We will need to remove more tissue. Would can arrange to do this at any time that works out for both of our schedules. ----- Message -----    From: Lab in [facility] Interface    Sent: 09/19/2014  10:24 AM      To: Robert Bellow, MD

## 2014-09-19 NOTE — Telephone Encounter (Signed)
Notified patient as instructed, patient pleased. Discussed follow-up appointments, patient agrees. He wants to schedule procedure tomorrow when he comes in.

## 2014-09-20 ENCOUNTER — Ambulatory Visit (INDEPENDENT_AMBULATORY_CARE_PROVIDER_SITE_OTHER): Payer: Self-pay | Admitting: *Deleted

## 2014-09-20 DIAGNOSIS — L723 Sebaceous cyst: Secondary | ICD-10-CM

## 2014-09-20 NOTE — Patient Instructions (Signed)
Patient to return at 8:30 on 09/27/14 for re excision.

## 2014-09-20 NOTE — Progress Notes (Signed)
The sutures were removed and steri strips applied. Patient to return on 09/27/14 at 8:30 for re excision .

## 2014-09-24 ENCOUNTER — Telehealth: Payer: Self-pay

## 2014-09-24 NOTE — Telephone Encounter (Signed)
He will receive an RX for pain medication.

## 2014-09-24 NOTE — Telephone Encounter (Signed)
Patient's wife called concerned about this next procedure scheduled for her husband for re excision of his shoulder. She states that with the previous surgery he had a large amount of pain that was not controlled with the use of Tylenol or Advil. He is concerned about this and wants to be sure that he will be able to have something to control the pain with this next procedure.

## 2014-09-27 ENCOUNTER — Other Ambulatory Visit: Payer: Self-pay | Admitting: General Surgery

## 2014-09-27 ENCOUNTER — Ambulatory Visit (INDEPENDENT_AMBULATORY_CARE_PROVIDER_SITE_OTHER): Payer: 59 | Admitting: General Surgery

## 2014-09-27 ENCOUNTER — Encounter: Payer: Self-pay | Admitting: General Surgery

## 2014-09-27 VITALS — BP 140/82 | HR 80 | Resp 14 | Ht 69.0 in | Wt 206.0 lb

## 2014-09-27 DIAGNOSIS — C4491 Basal cell carcinoma of skin, unspecified: Secondary | ICD-10-CM

## 2014-09-27 NOTE — Progress Notes (Signed)
Patient ID: Jimmy Green, male   DOB: August 26, 1950, 64 y.o.   MRN: [id number]  Chief Complaint  Patient presents with  . Procedure    HPI Jimmy Green is a 64 y.o. male.  Here today for re excision left shoulder mass showing basal cell carcinoma.   Marland KitchenHPI  Past Medical History  Diagnosis Date  . Hyperlipidemia   . Hypertension   . Coronary artery disease   . Skin cancer 2014    nose and right knee    Past Surgical History  Procedure Laterality Date  . Hernia repair    . Coronary stent placement    . Left heart catheterization with coronary angiogram N/A 10/08/2011    Procedure: LEFT HEART CATHETERIZATION WITH CORONARY ANGIOGRAM;  Surgeon: Clent Demark, MD;  Location: [facility] CATH LAB;  Service: Cardiovascular;  Laterality: N/A;  . Fractional flow reserve wire  10/08/2011    Procedure: FRACTIONAL FLOW RESERVE WIRE;  Surgeon: Clent Demark, MD;  Location: [facility] CATH LAB;  Service: Cardiovascular;;    Family History  Problem Relation Age of Onset  . Lung cancer Father   . Heart disease Father   . Skin cancer Father     Social History History  Substance Use Topics  . Smoking status: Former Smoker -- 85 years    Quit date: 08/03/2012  . Smokeless tobacco: Never Used  . Alcohol Use: No    No Known Allergies  Current Outpatient Prescriptions  Medication Sig Dispense Refill  . amLODipine (NORVASC) 5 MG tablet Take 5 mg by mouth daily.    [name] aspirin EC 81 MG tablet Take 81 mg by mouth every morning.     [name] atorvastatin (LIPITOR) 20 MG tablet Take 20 mg by mouth every evening.     [name] buPROPion (WELLBUTRIN XL) 150 MG 24 hr tablet Take 150 mg by mouth daily.    [name] escitalopram (LEXAPRO) 10 MG tablet Take 10 mg by mouth every evening.     [name] losartan-hydrochlorothiazide (HYZAAR) 100-12.5 MG per tablet Take 1 tablet by mouth daily.    . metoprolol (TOPROL-XL) 50 MG 24 hr tablet Take 50 mg by mouth every evening.     . nitroGLYCERIN (NITROSTAT) 0.4 MG SL tablet Place 0.4 mg under the  tongue every 5 (five) minutes as needed. For chest pain.    [name] omeprazole (PRILOSEC) 20 MG capsule Take 20 mg by mouth every morning.      No current facility-administered medications for this visit.    Review of Systems Review of Systems  Constitutional: Negative.   Respiratory: Negative.   Cardiovascular: Negative.     Blood pressure 140/82, pulse 80, resp. rate 14, height 5\' 9"  (1.753 m), weight 206 lb (93.441 kg).  Physical Exam Physical Exam  HENT:  Head:     Site of original excision on the left posterior shoulder is well-healed.  Data Reviewed 09/13/2014:  EXCISION, BASAL CELL CARCINOMA, NODULAR PATTERN, EXTENDING TO MULTIPLE PERIPHERAL MARGINS AND TO THE DEEP MARGIN Microscopic Description There are dermal aggregates of atypical basaloid cells which are predominately in large nests, and consistent with basal cell carcinoma. The lesion extends to multiple peripheral margins and to the deep margin. Holley Dexter MD Ph.D. Dermatopathologist, Electronic Signature (Case signed 09/19/2014) Specimen Gross and Clinical Information  Assessment    Basal cell carcinoma with multiple positive margins.    Plan    Reexcision was reviewed with the patient and his wife.  20 mL of 0.5% Xylocaine with 0.25%  Marcaine with 1-200,000 of epinephrine was utilized well tolerated. ChloraPrep was applied to the skin. 1 cm margins were outlined and a 6 cm long transversely oriented incision was utilized. The skin was incised sharply and the remaining dissection completed with electrocautery. The medial margin was tagged with a suture. It was sent in formalin for routine histology.  The skin was mobilized for 1.5 cm circumferentially with cautery. The deep tissue was then approximated with interrupted 3-0 Vicryl figure-of-eight sutures. The skin was closed with interrupted 4-0 nylon simple sutures. Telfa and Tegaderm dressing applied.  The patient was well tolerated. The patient will return for  suture removal with the nurse in 6 days.  Sunscreen application when out of doors encouraged.    Follow up in 6 months. Dressing care discussed 2 Aleve twice a day Norco 5/325mg  1 or 2 po q4h as needed for pain #30  The small multinodular area on the right nares should be evaluated by dermatology. Contact information for [place] [facility] provided.   PCP:  Frazier Butt 09/27/2014, 9:37 AM

## 2014-09-27 NOTE — Patient Instructions (Addendum)
Dressing care discussed 2 Aleve twice a day Norco 5/325mg  1 or 2 po q4h as needed for pain #30  [phone] [facility]

## 2014-10-02 ENCOUNTER — Telehealth: Payer: Self-pay

## 2014-10-02 NOTE — Telephone Encounter (Signed)
Notified patient as instructed, patient pleased. Discussed follow-up appointment in 2 months, patient agrees. Patient placed in recalls for follow up in May.

## 2014-10-02 NOTE — Telephone Encounter (Signed)
-----   Message from Robert Bellow, MD sent at 10/02/2014  8:25 AM EST ----- Please notify the patient that the reexcision margins were clear of any basal cell carcinoma. Follow-up as previously scheduled for suture removal. A physician follow-up in one-2 months if not already scheduled to be appropriate ----- Message -----    From: Lab in [facility] Interface    Sent: 10/01/2014   5:43 PM      To: Robert Bellow, MD

## 2014-10-09 ENCOUNTER — Ambulatory Visit (INDEPENDENT_AMBULATORY_CARE_PROVIDER_SITE_OTHER): Payer: 59 | Admitting: *Deleted

## 2014-10-09 DIAGNOSIS — C4491 Basal cell carcinoma of skin, unspecified: Secondary | ICD-10-CM

## 2014-10-09 NOTE — Patient Instructions (Signed)
Follow up as scheduled.  

## 2014-10-09 NOTE — Progress Notes (Signed)
Patient came in today for a wound check/suture removal.  The wound is clean, with no signs of infection noted. Steri strips applied. Aware of pathology. Follow up as scheduled.

## 2014-12-04 ENCOUNTER — Ambulatory Visit: Payer: 59 | Admitting: General Surgery

## 2015-01-02 ENCOUNTER — Encounter: Payer: Self-pay | Admitting: *Deleted

## 2015-01-11 ENCOUNTER — Other Ambulatory Visit: Payer: Self-pay | Admitting: Family Medicine

## 2015-01-11 NOTE — Telephone Encounter (Signed)
Patient needs refill

## 2015-01-15 ENCOUNTER — Ambulatory Visit
Admission: RE | Admit: 2015-01-15 | Discharge: 2015-01-15 | Disposition: A | Discharge status: Home / Self Care | Payer: Commercial Managed Care - HMO | Source: Ambulatory Visit | Attending: Gastroenterology | Admitting: Gastroenterology

## 2015-01-15 ENCOUNTER — Ambulatory Visit: Payer: Commercial Managed Care - HMO | Admitting: Anesthesiology

## 2015-01-15 ENCOUNTER — Encounter
Admission: RE | Disposition: A | Discharge status: Home / Self Care | Payer: Self-pay | Source: Ambulatory Visit | Attending: Gastroenterology

## 2015-01-15 DIAGNOSIS — Z7982 Long term (current) use of aspirin: Secondary | ICD-10-CM | POA: Insufficient documentation

## 2015-01-15 DIAGNOSIS — Z860100 Personal history of colon polyps, unspecified: Secondary | ICD-10-CM | POA: Insufficient documentation

## 2015-01-15 DIAGNOSIS — D124 Benign neoplasm of descending colon: Secondary | ICD-10-CM | POA: Insufficient documentation

## 2015-01-15 DIAGNOSIS — Z87891 Personal history of nicotine dependence: Secondary | ICD-10-CM | POA: Diagnosis not present

## 2015-01-15 DIAGNOSIS — E785 Hyperlipidemia, unspecified: Secondary | ICD-10-CM | POA: Diagnosis not present

## 2015-01-15 DIAGNOSIS — Z85828 Personal history of other malignant neoplasm of skin: Secondary | ICD-10-CM | POA: Insufficient documentation

## 2015-01-15 DIAGNOSIS — K219 Gastro-esophageal reflux disease without esophagitis: Secondary | ICD-10-CM | POA: Diagnosis not present

## 2015-01-15 DIAGNOSIS — K5730 Diverticulosis of large intestine without perforation or abscess without bleeding: Secondary | ICD-10-CM | POA: Diagnosis not present

## 2015-01-15 DIAGNOSIS — K529 Noninfective gastroenteritis and colitis, unspecified: Secondary | ICD-10-CM | POA: Diagnosis not present

## 2015-01-15 DIAGNOSIS — Z79899 Other long term (current) drug therapy: Secondary | ICD-10-CM | POA: Insufficient documentation

## 2015-01-15 DIAGNOSIS — Z1211 Encounter for screening for malignant neoplasm of colon: Secondary | ICD-10-CM | POA: Insufficient documentation

## 2015-01-15 DIAGNOSIS — I10 Essential (primary) hypertension: Secondary | ICD-10-CM | POA: Diagnosis not present

## 2015-01-15 DIAGNOSIS — D125 Benign neoplasm of sigmoid colon: Secondary | ICD-10-CM | POA: Insufficient documentation

## 2015-01-15 DIAGNOSIS — I2510 Atherosclerotic heart disease of native coronary artery without angina pectoris: Secondary | ICD-10-CM | POA: Diagnosis not present

## 2015-01-15 DIAGNOSIS — K633 Ulcer of intestine: Secondary | ICD-10-CM | POA: Insufficient documentation

## 2015-01-15 HISTORY — PX: COLONOSCOPY: SHX5424

## 2015-01-15 SURGERY — COLONOSCOPY
Anesthesia: General

## 2015-01-15 MED ORDER — PROPOFOL 10 MG/ML IV BOLUS
INTRAVENOUS | Status: DC | PRN
  Administered 2015-01-15: 50 mg via INTRAVENOUS

## 2015-01-15 MED ORDER — PROPOFOL INFUSION 10 MG/ML OPTIME
INTRAVENOUS | Status: DC | PRN
  Administered 2015-01-15: 160 ug/kg/min via INTRAVENOUS

## 2015-01-15 MED ORDER — FENTANYL CITRATE (PF) 100 MCG/2ML IJ SOLN
INTRAMUSCULAR | Status: DC | PRN
  Administered 2015-01-15: 50 ug via INTRAVENOUS

## 2015-01-15 MED ORDER — METOPROLOL TARTRATE 50 MG PO TABS
ORAL_TABLET | ORAL | Status: AC
  Administered 2015-01-15: 50 mg via ORAL
  Filled 2015-01-15: qty 1

## 2015-01-15 MED ORDER — MIDAZOLAM HCL 5 MG/5ML IJ SOLN
INTRAMUSCULAR | Status: DC | PRN
  Administered 2015-01-15: 1 mg via INTRAVENOUS

## 2015-01-15 MED ORDER — SODIUM CHLORIDE 0.9 % IV SOLN
INTRAVENOUS | Status: DC
  Administered 2015-01-15 (×2): via INTRAVENOUS

## 2015-01-15 MED ORDER — METOPROLOL TARTRATE 50 MG PO TABS
50.0000 mg | ORAL_TABLET | Freq: Once | ORAL | Status: AC
  Administered 2015-01-15: 50 mg via ORAL

## 2015-01-15 MED ORDER — METOPROLOL TARTRATE 50 MG PO TABS
ORAL_TABLET | ORAL | Status: AC
  Filled 2015-01-15: qty 1

## 2015-01-15 MED ORDER — LIDOCAINE HCL (CARDIAC) 20 MG/ML IV SOLN
INTRAVENOUS | Status: DC | PRN
  Administered 2015-01-15: 30 mg via INTRAVENOUS

## 2015-01-15 NOTE — Transfer of Care (Signed)
Immediate Anesthesia Transfer of Care Note  Patient: Jimmy Green  Procedure(s) Performed: Procedure(s): COLONOSCOPY (N/A)  Patient Location: PACU  Anesthesia Type:General  Level of Consciousness: awake  Airway & Oxygen Therapy: Patient Spontanous Breathing and Patient connected to face mask oxygen  Post-op Assessment: Report given to RN  Post vital signs: Reviewed and stable  Last Vitals:  Filed Vitals:   01/15/15 0838  BP: 126/85  Pulse: 74  Temp: 36.4 C  Resp: 1    Complications: No apparent anesthesia complications

## 2015-01-15 NOTE — Op Note (Signed)
[facility]astroenterology Patient Name: Jimmy Green Procedure Date: 01/15/2015 7:33 AM MRN: [id number] Account #: [id number] Date of Birth: [birth year] Admit Type: Outpatient Age: 64 Room: [facility] ENDO ROOM 1 Gender: Male Note Status: Finalized Procedure:         Colonoscopy Indications:       High risk colon cancer surveillance: Personal history of                     colonic polyps Providers:         Lucilla Lame, MD Referring MD:      Bethena Roys. Sowles, MD (Referring MD) Medicines:         Propofol per Anesthesia Complications:     No immediate complications. Procedure:         Pre-Anesthesia Assessment:                    - Prior to the procedure, a History and Physical was                     performed, and patient medications and allergies were                     reviewed. The patient's tolerance of previous anesthesia                     was also reviewed. The risks and benefits of the procedure                     and the sedation options and risks were discussed with the                     patient. All questions were answered, and informed consent                     was obtained. Prior Anticoagulants: The patient has taken                     no previous anticoagulant or antiplatelet agents. ASA                     Grade Assessment: II - A patient with mild systemic                     disease. After reviewing the risks and benefits, the                     patient was deemed in satisfactory condition to undergo                     the procedure.                    After obtaining informed consent, the colonoscope was                     passed under direct vision. Throughout the procedure, the                     patient's blood pressure, pulse, and oxygen saturations                     were monitored continuously. The Colonoscope was  introduced through the anus and advanced to the the                     terminal ileum. The  colonoscopy was performed without                     difficulty. The patient tolerated the procedure well. The                     quality of the bowel preparation was excellent. Findings:      The perianal and digital rectal examinations were normal.      A 5 mm polyp was found in the descending colon. The polyp was sessile.       The polyp was removed with a cold biopsy forceps. Resection and       retrieval were complete.      A 4 mm polyp was found in the sigmoid colon. The polyp was sessile. The       polyp was removed with a cold biopsy forceps. Resection and retrieval       were complete.      Multiple small-mouthed diverticula were found in the sigmoid colon.      Discontinuous areas of nonbleeding ulcerated mucosa with no stigmata of       recent bleeding were present at the ileocecal valve. Biopsies were taken       with a cold forceps for histology.      Localized inflammation, moderate in severity and characterized by deep       ulcerations was found in the terminal ileum. Biopsies were taken with a       cold forceps for histology. Impression:        - One 5 mm polyp in the descending colon. Resected and                     retrieved.                    - One 4 mm polyp in the sigmoid colon. Resected and                     retrieved.                    - Diverticulosis in the sigmoid colon.                    - Mucosal ulceration. Biopsied.                    - Ileitis. Biopsied. Recommendation:    - Await pathology results.                    - Repeat colonoscopy in 5 years for surveillance. Procedure Code(s): --- Professional ---                    [phone], Colonoscopy, flexible; with biopsy, single or                     multiple Diagnosis Code(s): --- Professional ---                    Z86.010, Personal history of colonic polyps                    D12.4, Benign neoplasm of descending colon  D12.5, Benign neoplasm of sigmoid colon                     K63.3, Ulcer of intestine                    K52.9, Noninfective gastroenteritis and colitis,                     unspecified CPT copyright 2014 American Medical Association. All rights reserved. The codes documented in this report are preliminary and upon coder review may  be revised to meet current compliance requirements. Lucilla Lame, MD 01/15/2015 8:29:16 AM This report has been signed electronically. Number of Addenda: 0 Note Initiated On: 01/15/2015 7:33 AM Scope Withdrawal Time: 0 hours 8 minutes 28 seconds  Total Procedure Duration: 0 hours 12 minutes 16 seconds       [facility]

## 2015-01-15 NOTE — Transfer of Care (Signed)
Immediate Anesthesia Transfer of Care Note  Patient: Jimmy Green  Procedure(s) Performed: Procedure(s): COLONOSCOPY (N/A)  Patient Location: PACU  Anesthesia Type:General  Level of Consciousness: awake  Airway & Oxygen Therapy: Patient Spontanous Breathing and Patient connected to face mask oxygen  Post-op Assessment: Report given to RN and Post -op Vital signs reviewed and stable  Post vital signs: stable  Last Vitals:  Filed Vitals:   01/15/15 0838  BP: 126/85  Pulse: 74  Temp: 36.4 C  Resp: 1    Complications: No apparent anesthesia complications

## 2015-01-15 NOTE — H&P (Signed)
[facility]  [address], [place], [place] [address] Phone: [phone] Fax : [phone]  Primary Care Physician:  Loistine Chance, MD Primary Gastroenterologist:  Dr. Allen Norris  Pre-Procedure History & Physical: HPI:  Jimmy Green is a 64 y.o. male is here for an colonoscopy.   Past Medical History  Diagnosis Date  . Hyperlipidemia   . Hypertension   . Coronary artery disease   . Skin cancer 2014    nose and right knee    Past Surgical History  Procedure Laterality Date  . Hernia repair    . Coronary stent placement    . Left heart catheterization with coronary angiogram N/A 10/08/2011    Procedure: LEFT HEART CATHETERIZATION WITH CORONARY ANGIOGRAM;  Surgeon: Clent Demark, MD;  Location: [facility] CATH LAB;  Service: Cardiovascular;  Laterality: N/A;  . Fractional flow reserve wire  10/08/2011    Procedure: FRACTIONAL FLOW RESERVE WIRE;  Surgeon: Clent Demark, MD;  Location: [facility] CATH LAB;  Service: Cardiovascular;;    Prior to Admission medications   Medication Sig Start Date End Date Taking? Authorizing Provider  amLODipine (NORVASC) 5 MG tablet Take 5 mg by mouth daily.   Yes Historical Provider, MD  aspirin EC 81 MG tablet Take 81 mg by mouth every morning.    Yes Historical Provider, MD  atorvastatin (LIPITOR) 40 MG tablet Take 40 mg by mouth daily.   Yes Historical Provider, MD  buPROPion (WELLBUTRIN SR) 150 MG 12 hr tablet Take 450 mg by mouth daily.   Yes Historical Provider, MD  escitalopram (LEXAPRO) 20 MG tablet Take 20 mg by mouth daily.   Yes Historical Provider, MD  losartan-hydrochlorothiazide (HYZAAR) 100-12.5 MG per tablet Take 1 tablet by mouth daily.   Yes Historical Provider, MD  metoprolol (TOPROL-XL) 50 MG 24 hr tablet Take 50 mg by mouth every evening.    Yes Historical Provider, MD  omeprazole (PRILOSEC) 20 MG capsule Take 20 mg by mouth every morning.    Yes Historical Provider, MD  ranitidine (ZANTAC) 300 MG tablet Take 300 mg by  mouth daily.   Yes Historical Provider, MD  nitroGLYCERIN (NITROSTAT) 0.4 MG SL tablet Place 0.4 mg under the tongue every 5 (five) minutes as needed. For chest pain.    Charolette Forward, MD  Omeprazole 20 MG TBEC TAKE 1 TABLET BY MOUTH DAILY 01/11/15   Steele Sizer, MD    Allergies as of 12/07/2014  . (No Known Allergies)    Family History  Problem Relation Age of Onset  . Lung cancer Father   . Heart disease Father   . Skin cancer Father     History   Social History  . Marital Status: Married    Spouse Name: N/A  . Number of Children: N/A  . Years of Education: N/A   Occupational History  . Not on file.   Social History Main Topics  . Smoking status: Former Smoker -- 17 years    Quit date: 08/03/2012  . Smokeless tobacco: Never Used  . Alcohol Use: No  . Drug Use: No  . Sexual Activity: Yes   Other Topics Concern  . Not on file   Social History Narrative    Review of Systems: See HPI, otherwise negative ROS  Physical Exam: BP 126/85 mmHg  Pulse 74  Temp(Src) 97.6 F (36.4 C) (Tympanic)  Resp 17  Ht 5\' 8"  (1.727 m)  Wt 212 lb (96.163 kg)  BMI 32.24 kg/m2  SpO2 99%  General:   Alert,  pleasant and cooperative in NAD Head:  Normocephalic and atraumatic. Neck:  Supple; no masses or thyromegaly. Lungs:  Clear throughout to auscultation.    Heart:  Regular rate and rhythm. Abdomen:  Soft, nontender and nondistended. Normal bowel sounds, without guarding, and without rebound.   Neurologic:  Alert and  oriented x4;  grossly normal neurologically.  Impression/Plan: Jimmy Green is here for an colonoscopy to be performed for history of polyps  Risks, benefits, limitations, and alternatives regarding  colonoscopy have been reviewed with the patient.  Questions have been answered.  All parties agreeable.   Ollen Bowl, MD  01/15/2015, 8:00 AM

## 2015-01-15 NOTE — Anesthesia Postprocedure Evaluation (Signed)
  Anesthesia Post-op Note  Patient: Jimmy Green  Procedure(s) Performed: Procedure(s): COLONOSCOPY (N/A)  Anesthesia type:General  Patient location: PACU  Post pain: Pain level controlled  Post assessment: Post-op Vital signs reviewed, Patient's Cardiovascular Status Stable, Respiratory Function Stable, Patent Airway and No signs of Nausea or vomiting  Post vital signs: Reviewed and stable  Last Vitals:  Filed Vitals:   01/15/15 0917  BP: 106/66  Pulse: 57  Temp: 37 C  Resp: 11    Level of consciousness: awake, alert  and patient cooperative  Complications: No apparent anesthesia complications

## 2015-01-15 NOTE — Anesthesia Preprocedure Evaluation (Signed)
Anesthesia Evaluation  Patient identified by MRN, date of birth, ID band Patient awake    Reviewed: Allergy & Precautions, NPO status , Patient's Chart, lab work & pertinent test results, reviewed documented beta blocker date and time   History of Anesthesia Complications Negative for: history of anesthetic complications  Airway Mallampati: III  TM Distance: >3 FB Neck ROM: Full    Dental  (+) Chipped,    Pulmonary former smoker,  breath sounds clear to auscultation  Pulmonary exam normal       Cardiovascular hypertension, Pt. on medications and Pt. on home beta blockers + CAD Normal cardiovascular examRhythm:Regular Rate:Normal  S/p cardiac stent 2013   Neuro/Psych Depression negative neurological ROS     GI/Hepatic negative GI ROS, Neg liver ROS, GERD-  Medicated and Controlled,  Endo/Other  negative endocrine ROS  Renal/GU negative Renal ROS  negative genitourinary   Musculoskeletal negative musculoskeletal ROS (+)   Abdominal   Peds negative pediatric ROS (+)  Hematology negative hematology ROS (+)   Anesthesia Other Findings   Reproductive/Obstetrics negative OB ROS                             Anesthesia Physical Anesthesia Plan  ASA: III  Anesthesia Plan: General   Post-op Pain Management:    Induction: Intravenous  Airway Management Planned: Nasal Cannula  Additional Equipment:   Intra-op Plan:   Post-operative Plan:   Informed Consent: I have reviewed the patients History and Physical, chart, labs and discussed the procedure including the risks, benefits and alternatives for the proposed anesthesia with the patient or authorized representative who has indicated his/her understanding and acceptance.   Dental advisory given  Plan Discussed with: CRNA and Surgeon  Anesthesia Plan Comments:         Anesthesia Quick Evaluation

## 2015-01-16 ENCOUNTER — Encounter: Payer: Self-pay | Admitting: Gastroenterology

## 2015-01-16 LAB — SURGICAL PATHOLOGY

## 2015-01-22 ENCOUNTER — Telehealth: Payer: Self-pay | Admitting: Gastroenterology

## 2015-01-22 NOTE — Telephone Encounter (Signed)
Pt would like to have colonoscopy results

## 2015-01-22 NOTE — Telephone Encounter (Signed)
Spoke with Pt regarding his results. Advised Dr. Allen Norris is out of the office and I will give him a call on Tuesday with his results. Pt was okay with this.

## 2015-01-30 ENCOUNTER — Telehealth: Payer: Self-pay

## 2015-01-30 NOTE — Telephone Encounter (Signed)
Spoke with pt to schedule an appt. Pt prefers going to [facility]. Scheduled with Brennan Bailey for tomorrow.

## 2015-01-30 NOTE — Telephone Encounter (Signed)
-----   Message from Lucilla Lame, MD sent at 01/29/2015 11:11 AM EDT ----- Please have the patient come in to discuss the pathology results.

## 2015-01-31 ENCOUNTER — Encounter: Payer: Self-pay | Admitting: Urgent Care

## 2015-01-31 ENCOUNTER — Ambulatory Visit (INDEPENDENT_AMBULATORY_CARE_PROVIDER_SITE_OTHER): Payer: Commercial Managed Care - HMO | Admitting: Urgent Care

## 2015-01-31 VITALS — BP 129/73 | HR 71 | Temp 98.0°F | Ht 69.0 in | Wt 208.8 lb

## 2015-01-31 DIAGNOSIS — K5730 Diverticulosis of large intestine without perforation or abscess without bleeding: Secondary | ICD-10-CM

## 2015-01-31 DIAGNOSIS — K529 Noninfective gastroenteritis and colitis, unspecified: Secondary | ICD-10-CM | POA: Diagnosis not present

## 2015-01-31 NOTE — Assessment & Plan Note (Signed)
High fiber diet

## 2015-01-31 NOTE — Progress Notes (Signed)
Gastroenterology Initial Patient Visit  Referring Provider:     Steele Sizer, MD Primary Care Physician:  Loistine Chance, MD Primary Gastroenterologist:  Dr. Allen Norris     Reason for Consultation:     Discuss pathology from screening colonoscopy        HPI:   Jimmy Green is a 64 y.o. y/o male here to discuss pathology results.  Denies NSAIDS.  5 years ago he had polyps that were benign.   Denies heartburn, indigestion, nausea, vomiting, dysphagia, odynophagia or anorexia. Denies constipation, diarrhea, rectal bleeding, melena or weight loss.  He had a screening colonoscopy by Dr Allen Norris with ileitis, focal ulceration, cryptitis, diverticulosis & benign polypectomies.  Differentials include infection, drug (NSAIDS), bowel prep or next to diverticula.  He is completely asymptomatic although he admits bowel prep was awful & made him very sick with nausea.  Past Medical History  Diagnosis Date  . Hyperlipidemia   . Hypertension   . Coronary artery disease   . Skin cancer 2014    nose and right knee  . Depression   . GERD (gastroesophageal reflux disease)   . Hypogonadism in male   . Metabolic syndrome     Past Surgical History  Procedure Laterality Date  . Hernia repair    . Coronary stent placement    . Left heart catheterization with coronary angiogram N/A 10/08/2011    Procedure: LEFT HEART CATHETERIZATION WITH CORONARY ANGIOGRAM;  Surgeon: Clent Demark, MD;  Location: [facility] CATH LAB;  Service: Cardiovascular;  Laterality: N/A;  . Fractional flow reserve wire  10/08/2011    Procedure: FRACTIONAL FLOW RESERVE WIRE;  Surgeon: Clent Demark, MD;  Location: [facility] CATH LAB;  Service: Cardiovascular;;  . Colonoscopy N/A 01/15/2015    Wohl-ileitis, 2 benign polyps, cryptitis, sigmoid diverticulosis, focal ulceration ICV    Prior to Admission medications   Medication Sig Start Date End Date Taking? Authorizing Provider  amLODipine (NORVASC) 5 MG tablet Take 5 mg by mouth daily.   Yes  Historical Provider, MD  aspirin EC 81 MG tablet Take 81 mg by mouth every morning.    Yes Historical Provider, MD  atorvastatin (LIPITOR) 40 MG tablet Take 40 mg by mouth daily.   Yes Historical Provider, MD  buPROPion (WELLBUTRIN SR) 150 MG 12 hr tablet Take 450 mg by mouth daily.   Yes Historical Provider, MD  escitalopram (LEXAPRO) 20 MG tablet Take 20 mg by mouth daily.   Yes Historical Provider, MD  losartan-hydrochlorothiazide (HYZAAR) 100-12.5 MG per tablet Take 1 tablet by mouth daily.   Yes Historical Provider, MD  metoprolol (TOPROL-XL) 50 MG 24 hr tablet Take 50 mg by mouth every evening.    Yes Historical Provider, MD  nitroGLYCERIN (NITROSTAT) 0.4 MG SL tablet Place 0.4 mg under the tongue every 5 (five) minutes as needed. For chest pain.   Yes Charolette Forward, MD  omeprazole (PRILOSEC) 20 MG capsule Take 20 mg by mouth every morning.     Historical Provider, MD  Omeprazole 20 MG TBEC TAKE 1 TABLET BY MOUTH DAILY Patient not taking: Reported on 01/31/2015 01/11/15   Steele Sizer, MD  ranitidine (ZANTAC) 300 MG tablet Take 300 mg by mouth daily.    Historical Provider, MD    Family History  Problem Relation Age of Onset  . Lung cancer Father   . Heart disease Father   . Skin cancer Father     History   Social History Narrative   History  Substance  Use Topics  . Smoking status: Former Smoker -- 27 years    Quit date: 08/03/2012  . Smokeless tobacco: Never Used  . Alcohol Use: No    Allergies as of 01/31/2015  . (No Known Allergies)    Review of Systems:    All systems reviewed and negative except where noted in HPI.   Physical Exam:  BP 129/73 mmHg  Pulse 71  Temp(Src) 98 F (36.7 C) (Oral)  Ht 5\' 9"  (1.753 m)  Wt 208 lb 12.8 oz (94.711 kg)  BMI 30.82 kg/m2 No LMP for male patient. General:   Alert,  Well-developed, well-nourished, pleasant and cooperative in NAD, accompanied by his wife Head:  Normocephalic and atraumatic. Eyes:  Sclera clear, no icterus.    Conjunctiva pink. Ears:  Normal auditory acuity. Nose:  No deformity, discharge, or lesions. Mouth:  No deformity or lesions,oropharynx pink & moist. Neck:  Supple; no masses or thyromegaly. Lungs:  Respirations even and unlabored.  Clear throughout to auscultation.   No wheezes, crackles, or rhonchi. No acute distress. Heart:  Regular rate and rhythm; no murmurs, clicks, rubs, or gallops. Abdomen:  Normal bowel sounds.  No bruits.  Soft, non-tender and non-distended without masses, hepatosplenomegaly or hernias noted.  No guarding or rebound tenderness.  Rectal:  Deferred.  Msk:  Symmetrical without gross deformities.  Good, equal movement & strength bilaterally. Pulses:  Normal pulses noted. Extremities:  No clubbing or edema.  No cyanosis. Neurologic:  Alert and oriented x3;  grossly normal neurologically. Skin:  Intact without significant lesions or rashes.  No jaundice. Lymph Nodes:  No significant cervical adenopathy. Psych:  Alert and cooperative. Normal mood and affect.

## 2015-01-31 NOTE — Assessment & Plan Note (Signed)
Likely secondary to bowel prep.  No worrisome features of chronicity including anemia, blood in stools, abd pain, weight loss or diarrhea.  If any problems in future, pt should call us.  Next colonoscopy in 5 yrs or sooner if worse

## 2015-01-31 NOTE — Patient Instructions (Signed)
Next colonoscopy in 5 years Call if any problems

## 2015-02-03 DIAGNOSIS — G4733 Obstructive sleep apnea (adult) (pediatric): Secondary | ICD-10-CM | POA: Insufficient documentation

## 2015-02-05 ENCOUNTER — Other Ambulatory Visit: Payer: Self-pay | Admitting: Family Medicine

## 2015-02-05 NOTE — Telephone Encounter (Signed)
Patient requesting refill. 

## 2015-02-07 ENCOUNTER — Encounter: Payer: Self-pay | Admitting: Family Medicine

## 2015-02-14 ENCOUNTER — Encounter: Payer: Self-pay | Admitting: Family Medicine

## 2015-02-14 DIAGNOSIS — L570 Actinic keratosis: Secondary | ICD-10-CM | POA: Insufficient documentation

## 2015-02-25 ENCOUNTER — Other Ambulatory Visit: Payer: Self-pay | Admitting: Family Medicine

## 2015-02-25 NOTE — Telephone Encounter (Signed)
Patient requesting refill. 

## 2015-03-04 ENCOUNTER — Other Ambulatory Visit: Payer: Self-pay | Admitting: Family Medicine

## 2015-03-04 NOTE — Telephone Encounter (Signed)
Patient requesting refill. 

## 2015-03-14 ENCOUNTER — Ambulatory Visit: Payer: Self-pay | Admitting: Family Medicine

## 2015-03-20 ENCOUNTER — Other Ambulatory Visit: Payer: Self-pay | Admitting: Family Medicine

## 2015-03-20 NOTE — Telephone Encounter (Signed)
Patient requesting refill. 

## 2015-03-28 ENCOUNTER — Encounter: Payer: Self-pay | Admitting: Family Medicine

## 2015-03-28 ENCOUNTER — Ambulatory Visit (INDEPENDENT_AMBULATORY_CARE_PROVIDER_SITE_OTHER): Payer: Medicaid Other | Admitting: Family Medicine

## 2015-03-28 VITALS — BP 108/62 | HR 82 | Temp 97.9°F | Resp 18 | Ht 69.0 in | Wt 210.7 lb

## 2015-03-28 DIAGNOSIS — I10 Essential (primary) hypertension: Secondary | ICD-10-CM | POA: Insufficient documentation

## 2015-03-28 DIAGNOSIS — I200 Unstable angina: Secondary | ICD-10-CM

## 2015-03-28 DIAGNOSIS — F329 Major depressive disorder, single episode, unspecified: Secondary | ICD-10-CM | POA: Insufficient documentation

## 2015-03-28 DIAGNOSIS — G4733 Obstructive sleep apnea (adult) (pediatric): Secondary | ICD-10-CM

## 2015-03-28 DIAGNOSIS — Z114 Encounter for screening for human immunodeficiency virus [HIV]: Secondary | ICD-10-CM | POA: Diagnosis not present

## 2015-03-28 DIAGNOSIS — K633 Ulcer of intestine: Secondary | ICD-10-CM

## 2015-03-28 DIAGNOSIS — Z23 Encounter for immunization: Secondary | ICD-10-CM | POA: Diagnosis not present

## 2015-03-28 DIAGNOSIS — I152 Hypertension secondary to endocrine disorders: Secondary | ICD-10-CM | POA: Insufficient documentation

## 2015-03-28 DIAGNOSIS — K219 Gastro-esophageal reflux disease without esophagitis: Secondary | ICD-10-CM | POA: Insufficient documentation

## 2015-03-28 DIAGNOSIS — Z125 Encounter for screening for malignant neoplasm of prostate: Secondary | ICD-10-CM | POA: Insufficient documentation

## 2015-03-28 DIAGNOSIS — Z1159 Encounter for screening for other viral diseases: Secondary | ICD-10-CM

## 2015-03-28 DIAGNOSIS — F32A Depression, unspecified: Secondary | ICD-10-CM

## 2015-03-28 DIAGNOSIS — E785 Hyperlipidemia, unspecified: Secondary | ICD-10-CM | POA: Insufficient documentation

## 2015-03-28 DIAGNOSIS — I2510 Atherosclerotic heart disease of native coronary artery without angina pectoris: Secondary | ICD-10-CM | POA: Diagnosis not present

## 2015-03-28 MED ORDER — AMLODIPINE BESYLATE 2.5 MG PO TABS: 2.5000 mg | ORAL_TABLET | Freq: Every day | ORAL | Status: DC

## 2015-03-28 MED ORDER — ESCITALOPRAM OXALATE 20 MG PO TABS: 20.0000 mg | ORAL_TABLET | Freq: Every day | ORAL | Status: DC

## 2015-03-28 MED ORDER — AMLODIPINE BESYLATE 2.5 MG PO TABS
5.0000 mg | ORAL_TABLET | Freq: Every day | ORAL | Status: DC
Start: 2015-03-28 — End: 2015-03-28

## 2015-03-28 MED ORDER — LOSARTAN POTASSIUM-HCTZ 100-12.5 MG PO TABS
1.0000 | ORAL_TABLET | Freq: Every day | ORAL | Status: DC
Start: 2015-03-28 — End: 2015-07-23

## 2015-03-28 MED ORDER — BUPROPION HCL ER (XL) 150 MG PO TB24: 450.0000 mg | ORAL_TABLET | Freq: Every day | ORAL | Status: DC

## 2015-03-28 MED ORDER — ATORVASTATIN CALCIUM 40 MG PO TABS
40.0000 mg | ORAL_TABLET | Freq: Every day | ORAL | Status: DC
Start: 2015-03-28 — End: 2015-07-23

## 2015-03-28 MED ORDER — NITROGLYCERIN 0.4 MG SL SUBL: 0.4000 mg | SUBLINGUAL_TABLET | SUBLINGUAL | Status: DC | PRN

## 2015-03-28 MED ORDER — OMEPRAZOLE 20 MG PO TBEC
1.0000 | DELAYED_RELEASE_TABLET | Freq: Every day | ORAL | Status: DC
Start: 2015-03-28 — End: 2015-07-23

## 2015-03-28 MED ORDER — METOPROLOL SUCCINATE ER 50 MG PO TB24: 50.0000 mg | ORAL_TABLET | Freq: Every evening | ORAL | Status: DC

## 2015-03-28 NOTE — Progress Notes (Signed)
Name: Jimmy Green   MRN: [id number]    DOB: 1951-03-18   Date:03/28/2015       Progress Note  Subjective  Chief Complaint  Chief Complaint  Patient presents with  . Medication Refill    3 month F/U  . Hypertension    Dizziness, Checks at the Pharmacy-108/62  . Depression    Improving  . Hyperlipidemia    No problems  . Gastrophageal Reflux    Well controlled with medication    Hypertension  Depression       Hyperlipidemia  Gastrophageal Reflux   HTN: patient is taking medication getting dizzy when he stands up, no chest pain or SOB, no PND.  He is compliant with his medication  CAD: he had stents placed in 2008 for unstable angina, he has never used NTG since stent placement. He takes statin, aspirin, and beta-blocker, able to go up a flight of stair without getting SOB.  GERD: had some ulceration on ileus during colonoscopy, needs to go back to GI, taking Pantoprazole and reflux is under control  Hyperlipidemia: taking Lipitor daily and denies side effects  Depression: doing well on medication , denies side effects, he states his has been doing well  OSA: he is now on CPAP 12 cm H2O, he is complaint at least 6 hours per night.  His bp has dropped, no edema. Feeling better   Patient Active Problem List   Diagnosis Date Noted  . Hypertension, benign 03/28/2015  . Prostate cancer screening 03/28/2015  . GERD (gastroesophageal reflux disease) 03/28/2015  . Hyperlipidemia 03/28/2015  . Depression 03/28/2015  . CAD in native artery 03/28/2015  . Actinic keratosis 02/14/2015  . Obstructive sleep apnea 02/03/2015  . Ileitis 01/31/2015  . Diverticulosis of colon without hemorrhage 01/31/2015  . Hx of colonic polyps   . Benign neoplasm of descending colon   . Benign neoplasm of sigmoid colon   . Ulceration of intestine   . Idiopathic colitis   . Sebaceous cyst 09/14/2014  . Unstable angina 10/01/2011    Past Surgical History  Procedure Laterality Date  .  Hernia repair    . Coronary stent placement    . Left heart catheterization with coronary angiogram N/A 10/08/2011    Procedure: LEFT HEART CATHETERIZATION WITH CORONARY ANGIOGRAM;  Surgeon: Clent Demark, MD;  Location: [facility] CATH LAB;  Service: Cardiovascular;  Laterality: N/A;  . Fractional flow reserve wire  10/08/2011    Procedure: FRACTIONAL FLOW RESERVE WIRE;  Surgeon: Clent Demark, MD;  Location: [facility] CATH LAB;  Service: Cardiovascular;;  . Colonoscopy N/A 01/15/2015    Wohl-ileitis, 2 benign polyps, cryptitis, sigmoid diverticulosis, focal ulceration ICV    Family History  Problem Relation Age of Onset  . Lung cancer Father   . Heart disease Father   . Skin cancer Father     Social History   Social History  . Marital Status: Married    Spouse Name: N/A  . Number of Children: N/A  . Years of Education: N/A   Occupational History  . Not on file.   Social History Main Topics  . Smoking status: Former Smoker -- 40 years    Types: Cigarettes    Quit date: 08/03/2012  . Smokeless tobacco: Never Used  . Alcohol Use: No  . Drug Use: No  . Sexual Activity:    Partners: Female   Other Topics Concern  . Not on file   Social History Narrative     Current outpatient  prescriptions:  .  amLODipine (NORVASC) 2.5 MG tablet, Take 2 tablets (5 mg total) by mouth daily., Disp: 30 tablet, Rfl: 0 .  aspirin EC 81 MG tablet, Take 81 mg by mouth every morning. , Disp: , Rfl:  .  atorvastatin (LIPITOR) 40 MG tablet, Take 1 tablet (40 mg total) by mouth daily., Disp: 30 tablet, Rfl: 3 .  buPROPion (WELLBUTRIN XL) 150 MG 24 hr tablet, Take 3 tablets (450 mg total) by mouth daily., Disp: 90 tablet, Rfl: 3 .  escitalopram (LEXAPRO) 20 MG tablet, Take 1 tablet (20 mg total) by mouth daily., Disp: 30 tablet, Rfl: 3 .  losartan-hydrochlorothiazide (HYZAAR) 100-12.5 MG per tablet, Take 1 tablet by mouth daily., Disp: 30 tablet, Rfl: 0 .  metoprolol succinate (TOPROL-XL) 50 MG 24 hr tablet,  Take 1 tablet (50 mg total) by mouth every evening., Disp: 30 tablet, Rfl: 3 .  nitroGLYCERIN (NITROSTAT) 0.4 MG SL tablet, Place 1 tablet (0.4 mg total) under the tongue every 5 (five) minutes as needed. For chest pain., Disp: 30 tablet, Rfl: 0 .  Omeprazole 20 MG TBEC, Take 1 tablet (20 mg total) by mouth daily., Disp: 30 tablet, Rfl: 3  No Known Allergies   Review of Systems  Psychiatric/Behavioral: Positive for depression.    Constitutional: Negative for fever or weight change.  Respiratory: Negative for cough and shortness of breath.   Cardiovascular: Negative for chest pain or palpitations.  Gastrointestinal: Negative for abdominal pain, no bowel changes.  Musculoskeletal: Negative for gait problem or joint swelling.  Skin: Negative for rash.  Neurological: Negative for dizziness or headache.  No other specific complaints in a complete review of systems (except as listed in HPI above). Objective  Filed Vitals:   03/28/15 1418  BP: 108/62  Pulse: 82  Temp: 97.9 F (36.6 C)  TempSrc: Oral  Resp: 18  Height: 5\' 9"  (1.753 m)  Weight: 210 lb 11.2 oz (95.573 kg)  SpO2: 94%    Body mass index is 31.1 kg/(m^2).  Physical Exam Constitutional: Patient appears well-developed and well-nourished. Obese  No distress.  HEENT: head atraumatic, normocephalic, pupils equal and reactive to light, , neck supple, throat within normal limits Cardiovascular: Normal rate, regular rhythm and normal heart sounds.  No murmur heard. No BLE edema. Pulmonary/Chest: Effort normal and breath sounds normal. No respiratory distress. Abdominal: Soft.  There is no tenderness. Psychiatric: Patient has a normal mood and affect. behavior is normal. Judgment and thought content normal.  Recent Results (from the past 2160 hour(s))  Surgical pathology     Status: None   Collection Time: 01/15/15  8:19 AM  Result Value Ref Range   SURGICAL PATHOLOGY      Surgical Pathology CASE: [id number] PATIENT:  Jimmy Green Surgical Pathology Report     SPECIMEN SUBMITTED: A. Terminal ileum, cold biopsy B. Colon, ileo-cecal valve, cold biopsy C. Colon polyp, descending, cold biopsy D. Colon polyp, sigmoid, cold biopsy  CLINICAL HISTORY: None provided  PRE-OPERATIVE DIAGNOSIS: HX polyps  POST-OPERATIVE DIAGNOSIS: Diverticulosis, terminal ileitis, hemorrhoids, colon polyps     DIAGNOSIS: A. TERMINAL ILEUM; COLD BIOPSY: - FRAGMENTS OF VILLOUS TIPS AND FOCALLY INTACT SMALL BOWEL MUCOSA WITH SMALL LYMPHOID AGGREGATE. - NEGATIVE FOR INTRA-EPITHELIAL LYMPHOCYTOSIS, DYSPLASIA AND MALIGNANCY.  B. COLON, ILEOCECAL VALVE; COLD BIOPSY: - COLONIC MUCOSA WITH FOCAL ULCERATION, SEE COMMENT. - SMALL BOWEL MUCOSA WITH INTACT VILLI. - NEGATIVE FOR DYSPLASIA AND MALIGNANCY.  C. COLON POLYP, DESCENDING; COLD BIOPSY: - POLYPOID COLONIC MUCOSA WITH SMALL LYMPHOID AGGREGATE AND  FOCAL CRYPTITIS. - NEGATIVE FOR DYSPLASIA AND MAL IGNANCY.  D. COLON POLYP, SIGMOID; COLD BIOPSY: - POLYPOID COLONIC MUCOSA WITH MILD FIBROSIS OF THE LAMINA PROPRIA. - NEGATIVE FOR DYSPLASIA AND MALIGNANCY.  Comment One of 3 tissue fragments shows focal ulceration.  Architectural features of chronicity are mild in a separate fragment and include crypt irregularities.  Scattered increased eosinophils within the lamina propria are present. The differential diagnosis for these findings include infectious colitis, biopsy adjacent to an inflamed diverticulum, drug (NSAIDs vs. other), and bowel preparation artifact.  Clinical correlation is necessary.   GROSS DESCRIPTION:  A. Labeled: biopsy terminal ileum Tissue Fragment(s): 2 Measurement: 0.2 and 0.4 cm Comment: pink  Entirely submitted in cassette(s): 1  B. Labeled: biopsy ileocecal valve Tissue Fragment(s): 2 Measurement: 0.4 cm Comment: Tan  Entirely submitted in cassette(s): 1  C. Labeled: C biopsy polyp DC Tissue Fragment(s): 1 Measurement: 0.4  cm C omment: pink  Entirely submitted in cassette(s): 1  D. Labeled: C biopsy Randall polyp Tissue Fragment(s): 1 Measurement: 0.5 cm Comment: Tan, marked blue  Entirely submitted in cassette(s): 1         Final Diagnosis performed by Delorse Lek, MD.  Electronically signed 01/16/2015 1:50:35PM    The electronic signature indicates that the named Attending Pathologist has evaluated the specimen  Technical component performed at [facility], [address], [place], [place] [address] Lab: [phone] Dir: Darrick Penna. Evette Doffing, MD  Professional component performed at [facility], [facility], [place], [place], [place] [address] Lab: [phone] Dir: Dellia Nims. Rubinas, MD        PHQ2/9: Depression screen [facility] 2/9 03/28/2015  Decreased Interest 0  Down, Depressed, Hopeless 0  PHQ - 2 Score 0     Fall Risk: Fall Risk  03/28/2015  Falls in the past year? No     Assessment & Plan  1. Obstructive sleep apnea Continue CPAP, states has some leakage, will contact [facility] to change mask  2. need flu  - Flu Vaccine QUAD 36+ mos PF IM (Fluarix & Fluzone Quad PF)  3. Hypertension, benign Decreased dose of Norvasc and recheck in one month - amLODipine (NORVASC) 2.5 MG tablet; Take 2 tablets (5 mg total) by mouth daily.  Dispense: 30 tablet; Refill: 0 - losartan-hydrochlorothiazide (HYZAAR) 100-12.5 MG per tablet; Take 1 tablet by mouth daily.  Dispense: 30 tablet; Refill: 0 - metoprolol succinate (TOPROL-XL) 50 MG 24 hr tablet; Take 1 tablet (50 mg total) by mouth every evening.  Dispense: 30 tablet; Refill: 3 - Comprehensive metabolic panel - CBC with Differential/Platelet  4. Prostate cancer screening  - PSA  5. Gastroesophageal reflux disease without esophagitis  - Omeprazole 20 MG TBEC; Take 1 tablet (20 mg total) by mouth daily.  Dispense: 30 tablet; Refill: 3  6. Hyperlipidemia  - atorvastatin (LIPITOR) 40 MG tablet; Take 1 tablet (40  mg total) by mouth daily.  Dispense: 30 tablet; Refill: 3 - Lipid panel  7. Ulceration of intestine Refer back to GI, he did not get a follow up appointment  8. Depression  - buPROPion (WELLBUTRIN XL) 150 MG 24 hr tablet; Take 3 tablets (450 mg total) by mouth daily.  Dispense: 90 tablet; Refill: 3 - escitalopram (LEXAPRO) 20 MG tablet; Take 1 tablet (20 mg total) by mouth daily.  Dispense: 30 tablet; Refill: 3   9. CAD in native artery  - metoprolol succinate (TOPROL-XL) 50 MG 24 hr tablet; Take 1 tablet (50 mg total) by mouth every evening.  Dispense: 30 tablet; Refill: 3 c-reactive protein  - nitroGLYCERIN (NITROSTAT) 0.4 MG SL tablet; Place 1 tablet (0.4 mg total) under the tongue every 5 (five) minutes as needed. For chest pain.  Dispense: 30 tablet; Refill: 0

## 2015-04-22 ENCOUNTER — Other Ambulatory Visit: Payer: Self-pay | Admitting: Family Medicine

## 2015-06-03 ENCOUNTER — Other Ambulatory Visit: Payer: Self-pay | Admitting: Family Medicine

## 2015-06-07 LAB — HM HEPATITIS C SCREENING LAB: HM Hepatitis Screen: NEGATIVE

## 2015-06-08 LAB — COMPREHENSIVE METABOLIC PANEL
A/G RATIO: 1.6 (ref 1.1–2.5)
ALT: 26 IU/L (ref 0–44)
AST: 22 IU/L (ref 0–40)
Albumin: 3.9 g/dL (ref 3.6–4.8)
Alkaline Phosphatase: 100 IU/L (ref 39–117)
BILIRUBIN TOTAL: 1.1 mg/dL (ref 0.0–1.2)
BUN / CREAT RATIO: 12 (ref 10–22)
BUN: 8 mg/dL (ref 8–27)
CHLORIDE: 102 mmol/L (ref 97–106)
CO2: 25 mmol/L (ref 18–29)
Calcium: 9.7 mg/dL (ref 8.6–10.2)
Creatinine, Ser: 0.68 mg/dL — ABNORMAL LOW (ref 0.76–1.27)
GFR calc non Af Amer: 102 mL/min/{1.73_m2} (ref >59)
GFR, EST AFRICAN AMERICAN: 117 mL/min/{1.73_m2} (ref >59)
Globulin, Total: 2.4 g/dL (ref 1.5–4.5)
Glucose: 108 mg/dL — ABNORMAL HIGH (ref 65–99)
POTASSIUM: 4 mmol/L (ref 3.5–5.2)
Sodium: 140 mmol/L (ref 136–144)
TOTAL PROTEIN: 6.3 g/dL (ref 6.0–8.5)

## 2015-06-08 LAB — HEPATITIS C ANTIBODY: Hep C Virus Ab: <0.1 s/co ratio (ref 0.0–0.9)

## 2015-06-08 LAB — LIPID PANEL
Chol/HDL Ratio: 5.3 ratio units — ABNORMAL HIGH (ref 0.0–5.0)
Cholesterol, Total: 149 mg/dL (ref 100–199)
HDL: 28 mg/dL — AB (ref >39)
LDL Calculated: 94 mg/dL (ref 0–99)
Triglycerides: 136 mg/dL (ref 0–149)
VLDL Cholesterol Cal: 27 mg/dL (ref 5–40)

## 2015-06-08 LAB — CBC WITH DIFFERENTIAL/PLATELET
BASOS ABS: 0.1 10*3/uL (ref 0.0–0.2)
Basos: 1 %
EOS (ABSOLUTE): 0.3 10*3/uL (ref 0.0–0.4)
Eos: 3 %
HEMOGLOBIN: 14.5 g/dL (ref 12.6–17.7)
Hematocrit: 42.7 % (ref 37.5–51.0)
IMMATURE GRANS (ABS): 0 10*3/uL (ref 0.0–0.1)
Immature Granulocytes: 0 %
LYMPHS: 38 %
Lymphocytes Absolute: 3.4 10*3/uL — ABNORMAL HIGH (ref 0.7–3.1)
MCH: 30.6 pg (ref 26.6–33.0)
MCHC: 34 g/dL (ref 31.5–35.7)
MCV: 90 fL (ref 79–97)
MONOCYTES: 6 %
Monocytes Absolute: 0.5 10*3/uL (ref 0.1–0.9)
Neutrophils Absolute: 4.6 10*3/uL (ref 1.4–7.0)
Neutrophils: 52 %
Platelets: 330 10*3/uL (ref 150–379)
RBC: 4.74 x10E6/uL (ref 4.14–5.80)
RDW: 13.3 % (ref 12.3–15.4)
WBC: 8.8 10*3/uL (ref 3.4–10.8)

## 2015-06-08 LAB — C-REACTIVE PROTEIN: CRP: 2.1 mg/L (ref 0.0–4.9)

## 2015-06-08 LAB — PSA: PROSTATE SPECIFIC AG, SERUM: 1.2 ng/mL (ref 0.0–4.0)

## 2015-06-08 LAB — HIV ANTIBODY (ROUTINE TESTING W REFLEX): HIV SCREEN 4TH GENERATION: NONREACTIVE

## 2015-06-09 ENCOUNTER — Other Ambulatory Visit: Payer: Self-pay | Admitting: Family Medicine

## 2015-06-09 DIAGNOSIS — R739 Hyperglycemia, unspecified: Secondary | ICD-10-CM

## 2015-06-19 ENCOUNTER — Ambulatory Visit: Payer: Medicaid Other | Admitting: Family Medicine

## 2015-06-21 ENCOUNTER — Ambulatory Visit (INDEPENDENT_AMBULATORY_CARE_PROVIDER_SITE_OTHER): Payer: Medicaid Other | Admitting: Family Medicine

## 2015-06-21 ENCOUNTER — Encounter: Payer: Self-pay | Admitting: Family Medicine

## 2015-06-21 VITALS — BP 138/92 | HR 75 | Temp 98.2°F | Resp 12 | Wt 214.6 lb

## 2015-06-21 DIAGNOSIS — I10 Essential (primary) hypertension: Secondary | ICD-10-CM

## 2015-06-21 DIAGNOSIS — R42 Dizziness and giddiness: Secondary | ICD-10-CM | POA: Insufficient documentation

## 2015-06-21 DIAGNOSIS — H9319 Tinnitus, unspecified ear: Secondary | ICD-10-CM | POA: Insufficient documentation

## 2015-06-21 DIAGNOSIS — H9311 Tinnitus, right ear: Secondary | ICD-10-CM

## 2015-06-21 MED ORDER — MECLIZINE HCL 32 MG PO TABS: 32.0000 mg | ORAL_TABLET | Freq: Three times a day (TID) | ORAL | Status: DC | PRN

## 2015-06-21 NOTE — Progress Notes (Signed)
Name: Jimmy Green   MRN: [id number]    DOB: 03/02/1951   Date:06/21/2015       Progress Note  Subjective  Chief Complaint  Chief Complaint  Patient presents with  . Dizziness    patient stated that there is something in his head that makes a sound and then he gets dizzy. patient stated that it has been going on for a couple of months but has progressively gotten worse.    HPI  Jimmy Green is a 64 year old male with know history of CAD, HTN, HLD. Presents today with dizziness. Similar symptoms reported back in August of this year as well and Norvasc dose was reduced. For his HTN he is currently on:  - amLODipine (NORVASC) 2.5 MG tablet one a day. - losartan-hydrochlorothiazide (HYZAAR) 100-12.5 MG one a day - metoprolol succinate (TOPROL-XL) 50 MG 24 hr tablet one a day  He notes a sound within inner right ear when he is dizzy. Described as a buzzing sensation, brief, followed by dizziness. He feels unsteady at times, not that the room around him is spinning. On further question he does state that his symptoms have progressed over the past few months, slowly worsening. Associated with brief visual changes, mild headache over right eye brow. Not associated vomiting, syncope, focal neurological symptoms.   Past Medical History  Diagnosis Date  . Hyperlipidemia   . Hypertension   . Coronary artery disease   . Skin cancer 2014    nose and right knee  . Depression   . GERD (gastroesophageal reflux disease)   . Hypogonadism in male   . Metabolic syndrome     Patient Active Problem List   Diagnosis Date Noted  . Dizziness of unknown cause 06/21/2015  . Hypertension goal BP (blood pressure) < 140/90 03/28/2015  . GERD (gastroesophageal reflux disease) 03/28/2015  . Hyperlipidemia 03/28/2015  . Depression 03/28/2015  . CAD in native artery 03/28/2015  . Actinic keratosis 02/14/2015  . Obstructive sleep apnea 02/03/2015  . Ileitis 01/31/2015  . Diverticulosis of colon  without hemorrhage 01/31/2015  . Hx of colonic polyps   . Benign neoplasm of descending colon   . Benign neoplasm of sigmoid colon   . Ulceration of intestine   . Idiopathic colitis   . Sebaceous cyst 09/14/2014    Social History  Substance Use Topics  . Smoking status: Former Smoker -- 40 years    Types: Cigarettes    Quit date: 08/03/2012  . Smokeless tobacco: Never Used  . Alcohol Use: No     Current outpatient prescriptions:  .  amLODipine (NORVASC) 2.5 MG tablet, Take 1 tablet (2.5 mg total) by mouth daily., Disp: 30 tablet, Rfl: 0 .  aspirin EC 81 MG tablet, Take 81 mg by mouth every morning. , Disp: , Rfl:  .  atorvastatin (LIPITOR) 40 MG tablet, Take 1 tablet (40 mg total) by mouth daily., Disp: 30 tablet, Rfl: 3 .  buPROPion (WELLBUTRIN XL) 150 MG 24 hr tablet, Take 3 tablets (450 mg total) by mouth daily., Disp: 90 tablet, Rfl: 3 .  escitalopram (LEXAPRO) 20 MG tablet, Take 1 tablet (20 mg total) by mouth daily., Disp: 30 tablet, Rfl: 3 .  losartan-hydrochlorothiazide (HYZAAR) 100-12.5 MG per tablet, Take 1 tablet by mouth daily., Disp: 30 tablet, Rfl: 0 .  metoprolol succinate (TOPROL-XL) 50 MG 24 hr tablet, Take 1 tablet (50 mg total) by mouth every evening., Disp: 30 tablet, Rfl: 3 .  nitroGLYCERIN (NITROSTAT) 0.4  MG SL tablet, Place 1 tablet (0.4 mg total) under the tongue every 5 (five) minutes as needed. For chest pain., Disp: 30 tablet, Rfl: 0 .  Omeprazole 20 MG TBEC, Take 1 tablet (20 mg total) by mouth daily., Disp: 30 tablet, Rfl: 3  Past Surgical History  Procedure Laterality Date  . Hernia repair    . Coronary stent placement    . Left heart catheterization with coronary angiogram N/A 10/08/2011    Procedure: LEFT HEART CATHETERIZATION WITH CORONARY ANGIOGRAM;  Surgeon: Clent Demark, MD;  Location: [facility] CATH LAB;  Service: Cardiovascular;  Laterality: N/A;  . Fractional flow reserve wire  10/08/2011    Procedure: FRACTIONAL FLOW RESERVE WIRE;  Surgeon: Clent Demark, MD;  Location: [facility] CATH LAB;  Service: Cardiovascular;;  . Colonoscopy N/A 01/15/2015    Wohl-ileitis, 2 benign polyps, cryptitis, sigmoid diverticulosis, focal ulceration ICV    Family History  Problem Relation Age of Onset  . Lung cancer Father   . Heart disease Father   . Skin cancer Father     No Known Allergies   Review of Systems  CONSTITUTIONAL: No significant weight changes, fever, chills, weakness or fatigue.  HEENT:  - Eyes: No visual changes.  - Ears: No auditory changes. No pain.  - Nose: No sneezing, congestion, runny nose. - Throat: No sore throat. No changes in swallowing. SKIN: No rash or itching.  CARDIOVASCULAR: No chest pain, chest pressure or chest discomfort. No palpitations or edema.  RESPIRATORY: No shortness of breath, cough or sputum.  NEUROLOGICAL: Yes dizziness. No syncope, paralysis, ataxia, numbness or tingling in the extremities. No memory changes. No change in bowel or bladder control.  ENDOCRINOLOGIC: No reports of sweating, cold or heat intolerance. No polyuria or polydipsia.     Objective  BP 138/92 mmHg  Pulse 75  Temp(Src) 98.2 F (36.8 C) (Oral)  Resp 12  Wt 214 lb 9.6 oz (97.342 kg)  SpO2 97% Body mass index is 31.68 kg/(m^2).  Standing 130/88 Sitting 138/92 Lying 142/92  Physical Exam  Constitutional: Patient is overweight and well-nourished. In no distress.  HEENT:  - Head: Normocephalic and atraumatic.  - Ears: Bilateral TMs gray, no erythema or effusion - Nose: Nasal mucosa moist - Mouth/Throat: Oropharynx is clear and moist. No tonsillar hypertrophy or erythema. No post nasal drainage.  - Eyes: Conjunctivae clear, EOM movements normal. PERRLA. No scleral icterus. Fundoscopic exam benign.  Neck: Normal range of motion. Neck supple. No JVD present. No thyromegaly present.  Cardiovascular: Normal rate, regular rhythm and normal heart sounds.  No murmur heard.  Pulmonary/Chest: Effort normal and breath sounds  normal. No respiratory distress. Peripheral vascular: Bilateral LE no edema. Neurological: CN II-XII grossly intact with no focal deficits. Alert and oriented to person, place, and time. Coordination, balance, strength, speech and gait are normal.  Psychiatric: Patient has a normal mood and affect. Behavior is normal in office today. Judgment and thought content normal in office today.   Recent Results (from the past 2160 hour(s))  Comprehensive metabolic panel     Status: Abnormal   Collection Time: 06/07/15  9:35 AM  Result Value Ref Range   Glucose 108 (H) 65 - 99 mg/dL   BUN 8 8 - 27 mg/dL   Creatinine, Ser 0.68 (L) 0.76 - 1.27 mg/dL   GFR calc non Af Amer 102 >59 mL/min/1.73   GFR calc Af Amer 117 >59 mL/min/1.73   BUN/Creatinine Ratio 12 10 - 22   Sodium 140  136 - 144 mmol/L   Potassium 4.0 3.5 - 5.2 mmol/L   Chloride 102 97 - 106 mmol/L   CO2 25 18 - 29 mmol/L   Calcium 9.7 8.6 - 10.2 mg/dL   Total Protein 6.3 6.0 - 8.5 g/dL   Albumin 3.9 3.6 - 4.8 g/dL   Globulin, Total 2.4 1.5 - 4.5 g/dL   Albumin/Globulin Ratio 1.6 1.1 - 2.5   Bilirubin Total 1.1 0.0 - 1.2 mg/dL   Alkaline Phosphatase 100 39 - 117 IU/L   AST 22 0 - 40 IU/L   ALT 26 0 - 44 IU/L  Lipid panel     Status: Abnormal   Collection Time: 06/07/15  9:35 AM  Result Value Ref Range   Cholesterol, Total 149 100 - 199 mg/dL   Triglycerides 136 0 - 149 mg/dL   HDL 28 (L) >39 mg/dL    Comment: According to ATP-III Guidelines, HDL-C >59 mg/dL is considered a negative risk factor for CHD.    VLDL Cholesterol Cal 27 5 - 40 mg/dL   LDL Calculated 94 0 - 99 mg/dL   Chol/HDL Ratio 5.3 (H) 0.0 - 5.0 ratio units    Comment:                                   T. Chol/HDL Ratio                                             Men  Women                               1/2 Avg.Risk  3.4    3.3                                   Avg.Risk  5.0    4.4                                2X Avg.Risk  9.6    7.1                                 3X Avg.Risk 23.4   11.0   C-reactive protein     Status: None   Collection Time: 06/07/15  9:35 AM  Result Value Ref Range   CRP 2.1 0.0 - 4.9 mg/L  CBC with Differential/Platelet     Status: Abnormal   Collection Time: 06/07/15  9:35 AM  Result Value Ref Range   WBC 8.8 3.4 - 10.8 x10E3/uL   RBC 4.74 4.14 - 5.80 x10E6/uL   Hemoglobin 14.5 12.6 - 17.7 g/dL   Hematocrit 42.7 37.5 - 51.0 %   MCV 90 79 - 97 fL   MCH 30.6 26.6 - 33.0 pg   MCHC 34.0 31.5 - 35.7 g/dL   RDW 13.3 12.3 - 15.4 %   Platelets 330 150 - 379 x10E3/uL   Neutrophils 52 %   Lymphs 38 %   Monocytes 6 %   Eos 3 %   Basos 1 %   Neutrophils Absolute 4.6  1.4 - 7.0 x10E3/uL   Lymphocytes Absolute 3.4 (H) 0.7 - 3.1 x10E3/uL   Monocytes Absolute 0.5 0.1 - 0.9 x10E3/uL   EOS (ABSOLUTE) 0.3 0.0 - 0.4 x10E3/uL   Basophils Absolute 0.1 0.0 - 0.2 x10E3/uL   Immature Granulocytes 0 %   Immature Grans (Abs) 0.0 0.0 - 0.1 x10E3/uL  PSA     Status: None   Collection Time: 06/07/15  9:35 AM  Result Value Ref Range   Prostate Specific Ag, Serum 1.2 0.0 - 4.0 ng/mL    Comment: Roche ECLIA methodology. According to the American Urological Association, Serum PSA should decrease and remain at undetectable levels after radical prostatectomy. The AUA defines biochemical recurrence as an initial PSA value 0.2 ng/mL or greater followed by a subsequent confirmatory PSA value 0.2 ng/mL or greater. Values obtained with different assay methods or kits cannot be used interchangeably. Results cannot be interpreted as absolute evidence of the presence or absence of malignant disease.   HIV antibody     Status: None   Collection Time: 06/07/15  9:35 AM  Result Value Ref Range   HIV Screen 4th Generation wRfx Non Reactive Non Reactive  Hepatitis C antibody     Status: None   Collection Time: 06/07/15  9:35 AM  Result Value Ref Range   Hep C Virus Ab <0.1 0.0 - 0.9 s/co ratio    Comment:                                   Negative:      < 0.8                              Indeterminate: 0.8 - 0.9                                   Positive:     > 0.9  The CDC recommends that a positive HCV antibody result  be followed up with a HCV Nucleic Acid Amplification  test [id number]).      Assessment & Plan  1. Dizziness of unknown cause Etiologies discussed include vestibular dysfunction, vertigo, meniere's and less likely cerebral or cerebellar etiology however patient is quite concerned for space occupying lesion due to family history of cancer with mets to the brain. I will proceed with ENT referral and CT Head w/o contrast. If symptoms worsening significantly patient advised to proceed to nearest ER. Otherwise I see no neurological deficit on exam today.  - CT Head Wo Contrast; Future - meclizine (ANTIVERT) 32 MG tablet; Take 1 tablet (32 mg total) by mouth 3 (three) times daily as needed.  Dispense: 30 tablet; Refill: 0 - Ambulatory referral to ENT  2. Hypertension goal BP (blood pressure) < 140/90 Reasonably controled although systolic BP is borderline. If CT head negative and patient willing perhaps he should return to his previous dose of Norvasc.   - CT Head Wo Contrast; Future  3. Buzzing in ear, right See AP number 1.  - Ambulatory referral to ENT

## 2015-06-21 NOTE — Patient Instructions (Signed)

## 2015-07-01 ENCOUNTER — Telehealth: Payer: Self-pay

## 2015-07-01 ENCOUNTER — Ambulatory Visit
Admission: RE | Admit: 2015-07-01 | Discharge: 2015-07-01 | Disposition: A | Discharge status: Home / Self Care | Payer: Medicaid Other | Source: Ambulatory Visit | Attending: Family Medicine | Admitting: Family Medicine

## 2015-07-01 DIAGNOSIS — I10 Essential (primary) hypertension: Secondary | ICD-10-CM | POA: Diagnosis present

## 2015-07-01 DIAGNOSIS — R42 Dizziness and giddiness: Secondary | ICD-10-CM

## 2015-07-01 DIAGNOSIS — I739 Peripheral vascular disease, unspecified: Secondary | ICD-10-CM | POA: Insufficient documentation

## 2015-07-01 NOTE — Telephone Encounter (Signed)
Contacted this patient to review the results from his CT and after he verified his date of birth, results were reviewed. Patient was informed that the ENT referral has already been placed and that he should hear from them soon. He then informed me that he has already been scheduled to see Dr. Richardson Landry and received their paperwork in the mail. Patient was encourage to keep that appointment and to give [place] a call if there was anything else we could do. He said that he would and thanks.

## 2015-07-08 ENCOUNTER — Ambulatory Visit (INDEPENDENT_AMBULATORY_CARE_PROVIDER_SITE_OTHER): Payer: Medicaid Other | Admitting: Family Medicine

## 2015-07-08 ENCOUNTER — Encounter: Payer: Self-pay | Admitting: Family Medicine

## 2015-07-08 ENCOUNTER — Other Ambulatory Visit: Payer: Self-pay | Admitting: Family Medicine

## 2015-07-08 ENCOUNTER — Ambulatory Visit
Admission: RE | Admit: 2015-07-08 | Discharge: 2015-07-08 | Disposition: A | Discharge status: Home / Self Care | Payer: Medicaid Other | Source: Ambulatory Visit | Attending: Family Medicine | Admitting: Family Medicine

## 2015-07-08 ENCOUNTER — Telehealth: Payer: Self-pay | Admitting: Family Medicine

## 2015-07-08 ENCOUNTER — Telehealth: Payer: Self-pay

## 2015-07-08 VITALS — HR 73 | Temp 97.8°F | Resp 16 | Wt 219.8 lb

## 2015-07-08 DIAGNOSIS — R42 Dizziness and giddiness: Secondary | ICD-10-CM

## 2015-07-08 DIAGNOSIS — R918 Other nonspecific abnormal finding of lung field: Secondary | ICD-10-CM | POA: Diagnosis not present

## 2015-07-08 DIAGNOSIS — R0781 Pleurodynia: Secondary | ICD-10-CM

## 2015-07-08 DIAGNOSIS — R0789 Other chest pain: Secondary | ICD-10-CM

## 2015-07-08 MED ORDER — OXYCODONE-ACETAMINOPHEN 5-325 MG PO TABS: 1.0000 | ORAL_TABLET | Freq: Four times a day (QID) | ORAL | Status: DC | PRN

## 2015-07-08 NOTE — Telephone Encounter (Signed)
Please speak with Jimmy Green or his wife and let him know that his chest x-ray needs to be repeated as there was a shadow from his nipple and they could not comment on a possible lung mass or rib fracture or not. Additional x-rays have been ordered, please have him go to the radiology department across the stress at his convenience to get the tests done.

## 2015-07-08 NOTE — Telephone Encounter (Signed)
Patient's wife was informed of Dr. Allie Dimmer message and was encouraged to go as soon as possible to have the repeat imaging done. She was encouraged to give [place] a call as soon as it has been performed.

## 2015-07-08 NOTE — Telephone Encounter (Signed)
New x-rays were ordered based upon [employer] instructions.

## 2015-07-08 NOTE — Telephone Encounter (Signed)
Call radiology and ask them to please comment on bone structures at patient had a fall onto the right antero-lateral side and is complaining of pain (near right nipple area).

## 2015-07-08 NOTE — Progress Notes (Signed)
Name: Jimmy Green   MRN: [id number]    DOB: [birth year]   Date:07/08/2015       Progress Note  Subjective  Chief Complaint  Chief Complaint  Patient presents with  . Rib Injury    patient had a dizzy episode then fell on his right side. patient stated that he just start spinning and loses his balance.    HPI  Mr. Jimmy Green is a 64 year old male with know history of CAD, HTN, HLD. Presents again today with dizziness, s/p fall and contusion to rib cage on this right side. Event was on Friday 07/05/15. He is in pain. Did not go to ER or Urgent Care.  If you may recall similar symptoms reported at our last visit 06/21/15 and prior to that back in August of this year. A CT head was ordered and completed which did not find any explanation of his symptoms. He had been referred to ENT for evaluation of possible vertigo and/or vestibular etiology of his dizziness.  Today he reports to me he canceled his ENT appointment today and instead decided to come to PCP office to address his rib pain. He says he is in no mood to see another new doctor and will reschedule his ENT appointment.    Past Medical History  Diagnosis Date  . Hyperlipidemia   . Hypertension   . Coronary artery disease   . Skin cancer 2014    nose and right knee  . Depression   . GERD (gastroesophageal reflux disease)   . Hypogonadism in male   . Metabolic syndrome     Patient Active Problem List   Diagnosis Date Noted  . Dizziness of unknown cause 06/21/2015  . Buzzing in ear 06/21/2015  . Hypertension goal BP (blood pressure) < 140/90 03/28/2015  . GERD (gastroesophageal reflux disease) 03/28/2015  . Hyperlipidemia 03/28/2015  . Depression 03/28/2015  . CAD in native artery 03/28/2015  . Actinic keratosis 02/14/2015  . Obstructive sleep apnea 02/03/2015  . Ileitis 01/31/2015  . Diverticulosis of colon without hemorrhage 01/31/2015  . Hx of colonic polyps   . Benign neoplasm of descending colon   . Benign  neoplasm of sigmoid colon   . Ulceration of intestine   . Idiopathic colitis   . Sebaceous cyst 09/14/2014    Social History  Substance Use Topics  . Smoking status: Former Smoker -- 40 years    Types: Cigarettes    Quit date: 08/03/2012  . Smokeless tobacco: Never Used  . Alcohol Use: No     Current outpatient prescriptions:  .  amLODipine (NORVASC) 2.5 MG tablet, Take 1 tablet (2.5 mg total) by mouth daily., Disp: 30 tablet, Rfl: 0 .  aspirin EC 81 MG tablet, Take 81 mg by mouth every morning. , Disp: , Rfl:  .  atorvastatin (LIPITOR) 40 MG tablet, Take 1 tablet (40 mg total) by mouth daily., Disp: 30 tablet, Rfl: 3 .  buPROPion (WELLBUTRIN XL) 150 MG 24 hr tablet, Take 3 tablets (450 mg total) by mouth daily., Disp: 90 tablet, Rfl: 3 .  escitalopram (LEXAPRO) 20 MG tablet, Take 1 tablet (20 mg total) by mouth daily., Disp: 30 tablet, Rfl: 3 .  losartan-hydrochlorothiazide (HYZAAR) 100-12.5 MG per tablet, Take 1 tablet by mouth daily., Disp: 30 tablet, Rfl: 0 .  meclizine (ANTIVERT) 32 MG tablet, Take 1 tablet (32 mg total) by mouth 3 (three) times daily as needed., Disp: 30 tablet, Rfl: 0 .  metoprolol succinate (TOPROL-XL) 50  MG 24 hr tablet, Take 1 tablet (50 mg total) by mouth every evening., Disp: 30 tablet, Rfl: 3 .  nitroGLYCERIN (NITROSTAT) 0.4 MG SL tablet, Place 1 tablet (0.4 mg total) under the tongue every 5 (five) minutes as needed. For chest pain., Disp: 30 tablet, Rfl: 0 .  Omeprazole 20 MG TBEC, Take 1 tablet (20 mg total) by mouth daily., Disp: 30 tablet, Rfl: 3  Past Surgical History  Procedure Laterality Date  . Hernia repair    . Coronary stent placement    . Left heart catheterization with coronary angiogram N/A 10/08/2011    Procedure: LEFT HEART CATHETERIZATION WITH CORONARY ANGIOGRAM;  Surgeon: Clent Demark, MD;  Location: [facility] CATH LAB;  Service: Cardiovascular;  Laterality: N/A;  . Fractional flow reserve wire  10/08/2011    Procedure: FRACTIONAL FLOW  RESERVE WIRE;  Surgeon: Clent Demark, MD;  Location: [facility] CATH LAB;  Service: Cardiovascular;;  . Colonoscopy N/A 01/15/2015    Wohl-ileitis, 2 benign polyps, cryptitis, sigmoid diverticulosis, focal ulceration ICV    Family History  Problem Relation Age of Onset  . Lung cancer Father   . Heart disease Father   . Skin cancer Father     No Known Allergies   Review of Systems  CONSTITUTIONAL: No significant weight changes, fever, chills, weakness or fatigue.  CARDIOVASCULAR: No chest pain, chest pressure or chest discomfort. No palpitations or edema.  RESPIRATORY: No shortness of breath, cough or sputum.  NEUROLOGICAL: Yes dizziness. No headache syncope, paralysis, ataxia, numbness or tingling in the extremities. No memory changes. No change in bowel or bladder control.  MUSCULOSKELETAL: Yes rib wall pain. HEMATOLOGIC: No anemia, bleeding or bruising.   Objective  Pulse 73  Temp(Src) 97.8 F (36.6 C) (Oral)  Resp 16  Wt 219 lb 12.8 oz (99.701 kg)  SpO2 94% Body mass index is 32.44 kg/(m^2).  Physical Exam  Constitutional: Patient is overweight and well-nourished. In no distress.  Neck: Normal range of motion. No tracheal deviation. Neck supple. No JVD present. No thyromegaly present.  Cardiovascular: Normal rate, regular rhythm and normal heart sounds. No murmur heard.  Pulmonary/Chest: No obvious contusion over chest wall, tenderness greatest right of the nipple at the level of the nipple on the right anterior chest wall. Effort normal and breath sounds normal. No respiratory distress. Neurological: CN II-XII grossly intact with no focal deficits. Alert and oriented to person, place, and time. Coordination, balance, strength, speech and gait are normal.  Psychiatric: Patient has a grumpy mood and affect. Behavior is normal in office today. Judgment and thought content normal in office today.   Assessment & Plan  1. Dizziness of unknown cause Reviewed CT head results in  detail with patient and accompanying relative. Encouraged patient to not delay ENT evaluation any further as continued dizziness can lead to serious falls and injury.  2. Rib pain on right side Will get X-ray to determine if there is a fracture and if any lung tissue is affected, clinically he is quite stable. I have provided him with Percocet for pain relief, instructed to wean down and alternate with Ibuprofen.  - DG Chest 2 View; Future - oxyCODONE-acetaminophen (ROXICET) 5-325 MG tablet; Take 1 tablet by mouth every 6 (six) hours as needed for severe pain.  Dispense: 40 tablet; Refill: 0

## 2015-07-08 NOTE — Telephone Encounter (Signed)
Wife was informed that this patient's x-ray was normal and no abnormal images were seen.

## 2015-07-23 ENCOUNTER — Encounter: Payer: Self-pay | Admitting: Family Medicine

## 2015-07-23 ENCOUNTER — Ambulatory Visit (INDEPENDENT_AMBULATORY_CARE_PROVIDER_SITE_OTHER): Payer: Medicaid Other | Admitting: Family Medicine

## 2015-07-23 VITALS — BP 154/88 | HR 69 | Temp 98.9°F | Resp 16 | Ht 69.0 in | Wt 220.5 lb

## 2015-07-23 DIAGNOSIS — R42 Dizziness and giddiness: Secondary | ICD-10-CM | POA: Diagnosis not present

## 2015-07-23 DIAGNOSIS — I2510 Atherosclerotic heart disease of native coronary artery without angina pectoris: Secondary | ICD-10-CM

## 2015-07-23 DIAGNOSIS — E8881 Metabolic syndrome: Secondary | ICD-10-CM | POA: Diagnosis not present

## 2015-07-23 DIAGNOSIS — F330 Major depressive disorder, recurrent, mild: Secondary | ICD-10-CM | POA: Diagnosis not present

## 2015-07-23 DIAGNOSIS — I10 Essential (primary) hypertension: Secondary | ICD-10-CM | POA: Diagnosis not present

## 2015-07-23 DIAGNOSIS — G4733 Obstructive sleep apnea (adult) (pediatric): Secondary | ICD-10-CM

## 2015-07-23 DIAGNOSIS — E785 Hyperlipidemia, unspecified: Secondary | ICD-10-CM

## 2015-07-23 DIAGNOSIS — K219 Gastro-esophageal reflux disease without esophagitis: Secondary | ICD-10-CM

## 2015-07-23 LAB — POCT GLYCOSYLATED HEMOGLOBIN (HGB A1C): Hemoglobin A1C: 6.4

## 2015-07-23 MED ORDER — OMEPRAZOLE 20 MG PO TBEC: 1.0000 | DELAYED_RELEASE_TABLET | Freq: Every day | ORAL | Status: DC

## 2015-07-23 MED ORDER — ATORVASTATIN CALCIUM 40 MG PO TABS: 40.0000 mg | ORAL_TABLET | Freq: Every day | ORAL | Status: DC

## 2015-07-23 MED ORDER — AMLODIPINE BESYLATE 2.5 MG PO TABS: 2.5000 mg | ORAL_TABLET | Freq: Every day | ORAL | Status: DC

## 2015-07-23 MED ORDER — METOPROLOL SUCCINATE ER 50 MG PO TB24: 50.0000 mg | ORAL_TABLET | Freq: Every evening | ORAL | Status: DC

## 2015-07-23 MED ORDER — ESCITALOPRAM OXALATE 20 MG PO TABS: 20.0000 mg | ORAL_TABLET | Freq: Every day | ORAL | Status: DC

## 2015-07-23 MED ORDER — BUPROPION HCL ER (XL) 150 MG PO TB24: 450.0000 mg | ORAL_TABLET | Freq: Every day | ORAL | Status: DC

## 2015-07-23 MED ORDER — LOSARTAN POTASSIUM-HCTZ 100-12.5 MG PO TABS: 1.0000 | ORAL_TABLET | Freq: Every day | ORAL | Status: DC

## 2015-07-23 NOTE — Progress Notes (Addendum)
Name: Jimmy Green   MRN: [id number]    DOB: 09-02-1950   Date:07/23/2015       Progress Note  Subjective  Chief Complaint  Chief Complaint  Patient presents with  . Medication Refill    follow-up  . Hypertension    dizziness  . Hyperlipidemia  . Dizziness    see's ENT today Dr. Richardson Landry  . Depression  . Gastroesophageal Reflux    HPI  Vertigo: he had one episode of syncope 07/05/2015 went to [facility], head CT negative, referred to ENT but missed appointment, he has another one scheduled for today. He states he has been having vertigo daily, triggered by head movement. Sometimes it is hard for him to walk in a straight line. Also has symptoms when he rolls in bed. He has also noticed some hearing loss, and also has noticed some tinnitus on the right ear over the past couple of months.  He also has a constant headache over the right eye for the past couple of months.   HTN: bp was well controlled, but since he has been worried about his dizziness his bp has been elevated. No chest pain or palpitation.   Hyperlipidemia: taking Atorvastatin and denies side effects.   GERD: symptoms well controlled, only one episode since last visit and resolved quickly.   Major Depression Mild: taking medications as prescribed, he stopped hunting because of the syncopal episode. He also stopped driving at night and is concerning him, but otherwise mood is stable. No crying spells. Energy level has been good, but still nap in the afternoon  OSA: not wearing her CPAP machine, feels sleepy in the pm's. He tried to change his mask, but he was told he needed a new appointment.  CAD: taking aspirin, stating and beta blocker, no chest pain or decrease in exercise tolerance  Patient Active Problem List   Diagnosis Date Noted  . Metabolic syndrome [id number]  . Rib pain on right side 07/08/2015  . Vertigo 06/21/2015  . Buzzing in ear 06/21/2015  . Hypertension goal BP (blood pressure) < 140/90 03/28/2015  .  GERD (gastroesophageal reflux disease) 03/28/2015  . Hyperlipidemia 03/28/2015  . Depression, major ([place]) 03/28/2015  . CAD in native artery 03/28/2015  . Actinic keratosis 02/14/2015  . Obstructive sleep apnea 02/03/2015  . Ileitis 01/31/2015  . Diverticulosis of colon without hemorrhage 01/31/2015  . Hx of colonic polyps   . Benign neoplasm of descending colon   . Benign neoplasm of sigmoid colon   . Ulceration of intestine   . Idiopathic colitis   . Sebaceous cyst 09/14/2014    Past Surgical History  Procedure Laterality Date  . Hernia repair    . Coronary stent placement    . Left heart catheterization with coronary angiogram N/A 10/08/2011    Procedure: LEFT HEART CATHETERIZATION WITH CORONARY ANGIOGRAM;  Surgeon: Clent Demark, MD;  Location: [facility] CATH LAB;  Service: Cardiovascular;  Laterality: N/A;  . Fractional flow reserve wire  10/08/2011    Procedure: FRACTIONAL FLOW RESERVE WIRE;  Surgeon: Clent Demark, MD;  Location: [place] CATH LAB;  Service: Cardiovascular;;  . Colonoscopy N/A 01/15/2015    Wohl-ileitis, 2 benign polyps, cryptitis, sigmoid diverticulosis, focal ulceration ICV    Family History  Problem Relation Age of Onset  . Lung cancer Father   . Heart disease Father   . Skin cancer Father     Social History   Social History  . Marital Status: Married    Spouse  Name: N/A  . Number of Children: N/A  . Years of Education: N/A   Occupational History  . Not on file.   Social History Main Topics  . Smoking status: Former Smoker -- 40 years    Types: Cigarettes    Quit date: 08/03/2012  . Smokeless tobacco: Never Used  . Alcohol Use: No  . Drug Use: No  . Sexual Activity:    Partners: Female   Other Topics Concern  . Not on file   Social History Narrative     Current outpatient prescriptions:  .  amLODipine (NORVASC) 2.5 MG tablet, Take 1 tablet (2.5 mg total) by mouth daily., Disp: 30 tablet, Rfl: 0 .  aspirin EC 81 MG tablet, Take 81 mg by  mouth every morning. , Disp: , Rfl:  .  atorvastatin (LIPITOR) 40 MG tablet, Take 1 tablet (40 mg total) by mouth daily., Disp: 30 tablet, Rfl: 3 .  buPROPion (WELLBUTRIN XL) 150 MG 24 hr tablet, Take 3 tablets (450 mg total) by mouth daily., Disp: 90 tablet, Rfl: 3 .  escitalopram (LEXAPRO) 20 MG tablet, Take 1 tablet (20 mg total) by mouth daily., Disp: 30 tablet, Rfl: 3 .  losartan-hydrochlorothiazide (HYZAAR) 100-12.5 MG tablet, Take 1 tablet by mouth daily., Disp: 30 tablet, Rfl: 3 .  meclizine (ANTIVERT) 32 MG tablet, Take 1 tablet (32 mg total) by mouth 3 (three) times daily as needed., Disp: 30 tablet, Rfl: 0 .  metoprolol succinate (TOPROL-XL) 50 MG 24 hr tablet, Take 1 tablet (50 mg total) by mouth every evening., Disp: 30 tablet, Rfl: 3 .  nitroGLYCERIN (NITROSTAT) 0.4 MG SL tablet, Place 1 tablet (0.4 mg total) under the tongue every 5 (five) minutes as needed. For chest pain., Disp: 30 tablet, Rfl: 0 .  Omeprazole 20 MG TBEC, Take 1 tablet (20 mg total) by mouth daily., Disp: 30 tablet, Rfl: 3  No Known Allergies   ROS  Constitutional: Negative for fever or weight change.  Respiratory: Negative for cough and shortness of breath.   Cardiovascular: Negative for chest pain or palpitations.  Gastrointestinal: Negative for abdominal pain, no bowel changes.  Musculoskeletal: Positive  for gait problem - balance problems -  or joint swelling.  Skin: Negative for rash.  Neurological: Positive  for dizziness or headache.  No other specific complaints in a complete review of systems (except as listed in HPI above).  Objective  Filed Vitals:   07/23/15 0806  BP: 154/88  Pulse: 69  Temp: 98.9 F (37.2 C)  TempSrc: Oral  Resp: 16  Height: 5\' 9"  (1.753 m)  Weight: 220 lb 8 oz (100.018 kg)  SpO2: 97%    Body mass index is 32.55 kg/(m^2).  Physical Exam  Constitutional: Patient appears well-developed and well-nourished. Obese  No distress.  HEENT: head atraumatic,  normocephalic, pupils equal and reactive to light, ears TM normal bilaterally , neck supple, throat within normal limits Cardiovascular: Normal rate, regular rhythm and normal heart sounds.  No murmur heard. No BLE edema. Pulmonary/Chest: Effort normal and breath sounds normal. No respiratory distress. Abdominal: Soft.  There is no tenderness. Psychiatric: Patient has a normal mood and affect. behavior is normal. Judgment and thought content normal. Neurological: no nystagmus with eye movement, but felt dizzy, normal grip and cranial nerves, Romberg negative  Recent Results (from the past 2160 hour(s))  Comprehensive metabolic panel     Status: Abnormal   Collection Time: 06/07/15  9:35 AM  Result Value Ref Range   Glucose  108 (H) 65 - 99 mg/dL   BUN 8 8 - 27 mg/dL   Creatinine, Ser 0.68 (L) 0.76 - 1.27 mg/dL   GFR calc non Af Amer 102 >59 mL/min/1.73   GFR calc Af Amer 117 >59 mL/min/1.73   BUN/Creatinine Ratio 12 10 - 22   Sodium 140 136 - 144 mmol/L   Potassium 4.0 3.5 - 5.2 mmol/L   Chloride 102 97 - 106 mmol/L   CO2 25 18 - 29 mmol/L   Calcium 9.7 8.6 - 10.2 mg/dL   Total Protein 6.3 6.0 - 8.5 g/dL   Albumin 3.9 3.6 - 4.8 g/dL   Globulin, Total 2.4 1.5 - 4.5 g/dL   Albumin/Globulin Ratio 1.6 1.1 - 2.5   Bilirubin Total 1.1 0.0 - 1.2 mg/dL   Alkaline Phosphatase 100 39 - 117 IU/L   AST 22 0 - 40 IU/L   ALT 26 0 - 44 IU/L  Lipid panel     Status: Abnormal   Collection Time: 06/07/15  9:35 AM  Result Value Ref Range   Cholesterol, Total 149 100 - 199 mg/dL   Triglycerides 136 0 - 149 mg/dL   HDL 28 (L) >39 mg/dL    Comment: According to ATP-III Guidelines, HDL-C >59 mg/dL is considered a negative risk factor for CHD.    VLDL Cholesterol Cal 27 5 - 40 mg/dL   LDL Calculated 94 0 - 99 mg/dL   Chol/HDL Ratio 5.3 (H) 0.0 - 5.0 ratio units    Comment:                                   T. Chol/HDL Ratio                                             Men  Women                                1/2 Avg.Risk  3.4    3.3                                   Avg.Risk  5.0    4.4                                2X Avg.Risk  9.6    7.1                                3X Avg.Risk 23.4   11.0   C-reactive protein     Status: None   Collection Time: 06/07/15  9:35 AM  Result Value Ref Range   CRP 2.1 0.0 - 4.9 mg/L  CBC with Differential/Platelet     Status: Abnormal   Collection Time: 06/07/15  9:35 AM  Result Value Ref Range   WBC 8.8 3.4 - 10.8 x10E3/uL   RBC 4.74 4.14 - 5.80 x10E6/uL   Hemoglobin 14.5 12.6 - 17.7 g/dL   Hematocrit 42.7 37.5 - 51.0 %   MCV 90 79 - 97 fL   MCH 30.6 26.6 - 33.0  pg   MCHC 34.0 31.5 - 35.7 g/dL   RDW 13.3 12.3 - 15.4 %   Platelets 330 150 - 379 x10E3/uL   Neutrophils 52 %   Lymphs 38 %   Monocytes 6 %   Eos 3 %   Basos 1 %   Neutrophils Absolute 4.6 1.4 - 7.0 x10E3/uL   Lymphocytes Absolute 3.4 (H) 0.7 - 3.1 x10E3/uL   Monocytes Absolute 0.5 0.1 - 0.9 x10E3/uL   EOS (ABSOLUTE) 0.3 0.0 - 0.4 x10E3/uL   Basophils Absolute 0.1 0.0 - 0.2 x10E3/uL   Immature Granulocytes 0 %   Immature Grans (Abs) 0.0 0.0 - 0.1 x10E3/uL  PSA     Status: None   Collection Time: 06/07/15  9:35 AM  Result Value Ref Range   Prostate Specific Ag, Serum 1.2 0.0 - 4.0 ng/mL    Comment: Roche ECLIA methodology. According to the American Urological Association, Serum PSA should decrease and remain at undetectable levels after radical prostatectomy. The AUA defines biochemical recurrence as an initial PSA value 0.2 ng/mL or greater followed by a subsequent confirmatory PSA value 0.2 ng/mL or greater. Values obtained with different assay methods or kits cannot be used interchangeably. Results cannot be interpreted as absolute evidence of the presence or absence of malignant disease.   HIV antibody     Status: None   Collection Time: 06/07/15  9:35 AM  Result Value Ref Range   HIV Screen 4th Generation wRfx Non Reactive Non Reactive  Hepatitis C antibody      Status: None   Collection Time: 06/07/15  9:35 AM  Result Value Ref Range   Hep C Virus Ab <0.1 0.0 - 0.9 s/co ratio    Comment:                                   Negative:     < 0.8                              Indeterminate: 0.8 - 0.9                                   Positive:     > 0.9  The CDC recommends that a positive HCV antibody result  be followed up with a HCV Nucleic Acid Amplification  test [id number]).   POCT HgB A1C     Status: None   Collection Time: 07/23/15  8:48 AM  Result Value Ref Range   Hemoglobin A1C 6.4      PHQ2/9: Depression screen [facility] 2/9 07/08/2015 06/21/2015 03/28/2015  Decreased Interest 0 0 0  Down, Depressed, Hopeless 0 0 0  PHQ - 2 Score 0 0 0     Fall Risk: Fall Risk  07/08/2015 06/21/2015 03/28/2015  Falls in the past year? Yes Yes No  Number falls in past yr: 2 or more 2 or more -  Injury with Fall? Yes No -  Risk for fall due to : Other (Comment) Impaired balance/gait -  Follow up Falls prevention discussed;Education provided;Falls evaluation completed - -      Assessment & Plan  1. Hypertension, benign  He states bp usually at goal, will return in one month for follow up - metoprolol succinate (TOPROL-XL) 50 MG 24 hr tablet; Take 1 tablet (50  mg total) by mouth every evening.  Dispense: 30 tablet; Refill: 3 - losartan-hydrochlorothiazide (HYZAAR) 100-12.5 MG tablet; Take 1 tablet by mouth daily.  Dispense: 30 tablet; Refill: 3 - amLODipine (NORVASC) 2.5 MG tablet; Take 1 tablet (2.5 mg total) by mouth daily.  Dispense: 30 tablet; Refill: 0  2. CAD in native artery  - metoprolol succinate (TOPROL-XL) 50 MG 24 hr tablet; Take 1 tablet (50 mg total) by mouth every evening.  Dispense: 30 tablet; Refill: 3  3. Gastroesophageal reflux disease without esophagitis  - Omeprazole 20 MG TBEC; Take 1 tablet (20 mg total) by mouth daily.  Dispense: 30 tablet; Refill: 3  4. Depression, major, recurrent, mild (HCC)  - escitalopram (LEXAPRO) 20  MG tablet; Take 1 tablet (20 mg total) by mouth daily.  Dispense: 30 tablet; Refill: 3 - buPROPion (WELLBUTRIN XL) 150 MG 24 hr tablet; Take 3 tablets (450 mg total) by mouth daily.  Dispense: 90 tablet; Refill: 3  5. Hyperlipidemia  Lipid panel shows low HDL : to improve HDL patient  needs to eat tree nuts ( pecans/pistachios/almonds ) four times weekly, eat fish two times weekly  and exercise  at least 150 minutes per week - atorvastatin (LIPITOR) 40 MG tablet; Take 1 tablet (40 mg total) by mouth daily.  Dispense: 30 tablet; Refill: 3  6. Obstructive sleep apnea  We will order another mask for him   7. Vertigo   He has follow up with ENT today   8. Metabolic syndrome  - POCT HgB A1C 6.4% Discussed importance of life style modification, he will cut down on sweet beverages and starches

## 2015-07-23 NOTE — Addendum Note (Signed)
Addended by: Inda Coke on: 07/23/2015 08:48 AM   Modules accepted: Orders

## 2015-08-02 ENCOUNTER — Other Ambulatory Visit: Payer: Self-pay | Admitting: Otolaryngology

## 2015-08-02 DIAGNOSIS — R42 Dizziness and giddiness: Secondary | ICD-10-CM

## 2015-08-21 ENCOUNTER — Telehealth: Payer: Self-pay

## 2015-08-21 NOTE — Telephone Encounter (Signed)
Per Dr. Steele Sizer, I contacted this patient to schedule him an f/u appt due to the fax that was received from [facility]. I spoke with his wife and she stated that he already has an appt scheduled on 08/26/15, so I told her just to make sure he keeps that appt and then everything will be discussed then. She agreed and said thanks.

## 2015-08-23 ENCOUNTER — Ambulatory Visit
Admission: RE | Admit: 2015-08-23 | Discharge: 2015-08-23 | Disposition: A | Discharge status: Home / Self Care | Payer: Medicaid Other | Source: Ambulatory Visit | Attending: Otolaryngology | Admitting: Otolaryngology

## 2015-08-23 DIAGNOSIS — R42 Dizziness and giddiness: Secondary | ICD-10-CM | POA: Diagnosis present

## 2015-08-23 DIAGNOSIS — G319 Degenerative disease of nervous system, unspecified: Secondary | ICD-10-CM | POA: Diagnosis not present

## 2015-08-23 LAB — POCT I-STAT CREATININE: CREATININE: 0.7 mg/dL (ref 0.61–1.24)

## 2015-08-23 MED ORDER — GADOBENATE DIMEGLUMINE 529 MG/ML IV SOLN
20.0000 mL | Freq: Once | INTRAVENOUS | Status: AC | PRN
  Administered 2015-08-23: 20 mL via INTRAVENOUS

## 2015-08-26 ENCOUNTER — Ambulatory Visit (INDEPENDENT_AMBULATORY_CARE_PROVIDER_SITE_OTHER): Payer: Medicaid Other | Admitting: Family Medicine

## 2015-08-26 ENCOUNTER — Encounter: Payer: Self-pay | Admitting: Family Medicine

## 2015-08-26 ENCOUNTER — Telehealth: Payer: Self-pay

## 2015-08-26 VITALS — BP 136/82 | HR 71 | Temp 97.9°F | Resp 16 | Wt 218.4 lb

## 2015-08-26 DIAGNOSIS — E785 Hyperlipidemia, unspecified: Secondary | ICD-10-CM | POA: Diagnosis not present

## 2015-08-26 DIAGNOSIS — G4733 Obstructive sleep apnea (adult) (pediatric): Secondary | ICD-10-CM

## 2015-08-26 DIAGNOSIS — F330 Major depressive disorder, recurrent, mild: Secondary | ICD-10-CM | POA: Diagnosis not present

## 2015-08-26 DIAGNOSIS — H903 Sensorineural hearing loss, bilateral: Secondary | ICD-10-CM | POA: Diagnosis not present

## 2015-08-26 DIAGNOSIS — I679 Cerebrovascular disease, unspecified: Secondary | ICD-10-CM | POA: Diagnosis not present

## 2015-08-26 DIAGNOSIS — I2510 Atherosclerotic heart disease of native coronary artery without angina pectoris: Secondary | ICD-10-CM | POA: Diagnosis not present

## 2015-08-26 DIAGNOSIS — I10 Essential (primary) hypertension: Secondary | ICD-10-CM | POA: Diagnosis not present

## 2015-08-26 DIAGNOSIS — L989 Disorder of the skin and subcutaneous tissue, unspecified: Secondary | ICD-10-CM

## 2015-08-26 DIAGNOSIS — I6789 Other cerebrovascular disease: Secondary | ICD-10-CM

## 2015-08-26 DIAGNOSIS — R42 Dizziness and giddiness: Secondary | ICD-10-CM | POA: Diagnosis not present

## 2015-08-26 DIAGNOSIS — G4489 Other headache syndrome: Secondary | ICD-10-CM | POA: Diagnosis not present

## 2015-08-26 MED ORDER — AMLODIPINE BESYLATE 2.5 MG PO TABS: 2.5000 mg | ORAL_TABLET | Freq: Every day | ORAL | Status: DC

## 2015-08-26 NOTE — Progress Notes (Signed)
Name: Jimmy Green   MRN: [id number]    DOB: 05-11-1951   Date:08/26/2015       Progress Note  Subjective  Chief Complaint  Chief Complaint  Patient presents with  . Hypertension    patient is here for a 76-month f/u  . Dizziness    patient stated that it has gotten worse  . Insomnia    patient stated that he does not sleep well. he is up every morning at 2:30am.  . Headache    right sided throbbing about everyday. lots of pressure behind right eye.  . Skin Cancer    patient feels that it has came back     HPI   Vertigo: symptoms started in Nov 2016. He had one episode of syncope 07/05/2015 went to [facility], head CT negative He states he has been having vertigo daily, triggered by head movement. Sometimes it is hard for him to walk in a straight line. He states no longer happens when he rolls in his bed, but gets dizzy when he stands up at time.  He has also noticed some hearing loss, and also has noticed some tinnitus on the right ear over the past couple of months ( he was evaluated by ENT and advised to have evaluation for hearing aid and MRI with and without contrast was negative. He has also noticed intermittent right side headache, usually behind right eye, described as throbbing and aching, 8/10 usually in intensity, and lasts about one hour. Resolves with Advil. He states he also feels better when he close his right eye. No tearing from that side.   HTN: bp was well controlled  No chest pain or palpitation.   Hyperlipidemia: taking Atorvastatin and denies side effects.   OSA: not wearing her CPAP machine, waking up between 2: 30 and 3 am every night and takes a little while to fall back asleep. He tried to change his mask, but he was told he needs a new sleep study . He lost his CPAP because of poor compliance but he states he was not compliant because mask was leaking.   CAD: taking aspirin, stating and beta blocker, no chest pain or decrease in exercise tolerance  Patient Active  Problem List   Diagnosis Date Noted  . Hearing loss sensory, bilateral 08/26/2015  . Metabolic syndrome [id number]  . Vertigo 06/21/2015  . Buzzing in ear 06/21/2015  . Hypertension goal BP (blood pressure) < 140/90 03/28/2015  . GERD (gastroesophageal reflux disease) 03/28/2015  . Hyperlipidemia 03/28/2015  . Depression, major ([place]) 03/28/2015  . CAD in native artery 03/28/2015  . Actinic keratosis 02/14/2015  . Obstructive sleep apnea 02/03/2015  . Ileitis 01/31/2015  . Diverticulosis of colon without hemorrhage 01/31/2015  . Hx of colonic polyps   . Benign neoplasm of descending colon   . Benign neoplasm of sigmoid colon   . Ulceration of intestine   . Idiopathic colitis   . Sebaceous cyst 09/14/2014    Past Surgical History  Procedure Laterality Date  . Hernia repair    . Coronary stent placement    . Left heart catheterization with coronary angiogram N/A 10/08/2011    Procedure: LEFT HEART CATHETERIZATION WITH CORONARY ANGIOGRAM;  Surgeon: Clent Demark, MD;  Location: [facility] CATH LAB;  Service: Cardiovascular;  Laterality: N/A;  . Fractional flow reserve wire  10/08/2011    Procedure: FRACTIONAL FLOW RESERVE WIRE;  Surgeon: Clent Demark, MD;  Location: [facility] CATH LAB;  Service: Cardiovascular;;  . Colonoscopy  N/A 01/15/2015    Wohl-ileitis, 2 benign polyps, cryptitis, sigmoid diverticulosis, focal ulceration ICV    Family History  Problem Relation Age of Onset  . Lung cancer Father   . Heart disease Father   . Skin cancer Father     Social History   Social History  . Marital Status: Married    Spouse Name: N/A  . Number of Children: N/A  . Years of Education: N/A   Occupational History  . Not on file.   Social History Main Topics  . Smoking status: Former Smoker -- 40 years    Types: Cigarettes    Quit date: 08/03/2012  . Smokeless tobacco: Never Used  . Alcohol Use: No  . Drug Use: No  . Sexual Activity:    Partners: Female   Other Topics Concern  . Not  on file   Social History Narrative     Current outpatient prescriptions:  .  amLODipine (NORVASC) 2.5 MG tablet, Take 1 tablet (2.5 mg total) by mouth daily., Disp: 30 tablet, Rfl: 5 .  aspirin EC 81 MG tablet, Take 81 mg by mouth every morning. , Disp: , Rfl:  .  atorvastatin (LIPITOR) 40 MG tablet, Take 1 tablet (40 mg total) by mouth daily., Disp: 30 tablet, Rfl: 3 .  buPROPion (WELLBUTRIN XL) 150 MG 24 hr tablet, Take 3 tablets (450 mg total) by mouth daily., Disp: 90 tablet, Rfl: 3 .  escitalopram (LEXAPRO) 20 MG tablet, Take 1 tablet (20 mg total) by mouth daily., Disp: 30 tablet, Rfl: 3 .  ibuprofen (ADVIL,MOTRIN) 800 MG tablet, , Disp: , Rfl: 1 .  losartan-hydrochlorothiazide (HYZAAR) 100-12.5 MG tablet, Take 1 tablet by mouth daily., Disp: 30 tablet, Rfl: 3 .  meclizine (ANTIVERT) 32 MG tablet, Take 1 tablet (32 mg total) by mouth 3 (three) times daily as needed., Disp: 30 tablet, Rfl: 0 .  metoprolol succinate (TOPROL-XL) 50 MG 24 hr tablet, Take 1 tablet (50 mg total) by mouth every evening., Disp: 30 tablet, Rfl: 3 .  nitroGLYCERIN (NITROSTAT) 0.4 MG SL tablet, Place 1 tablet (0.4 mg total) under the tongue every 5 (five) minutes as needed. For chest pain., Disp: 30 tablet, Rfl: 0 .  Omeprazole 20 MG TBEC, Take 1 tablet (20 mg total) by mouth daily., Disp: 30 tablet, Rfl: 3  No Known Allergies   ROS  Constitutional: Negative for fever or weight change.  Respiratory: Negative for cough and shortness of breath.   Cardiovascular: Negative for chest pain or palpitations.  Gastrointestinal: Negative for abdominal pain, no bowel changes.  Musculoskeletal: Negative for gait problem or joint swelling.  Skin: Negative for rash.  Neurological: Positive  for dizziness or headache.  No other specific complaints in a complete review of systems (except as listed in HPI above).  Objective  Filed Vitals:   08/26/15 0924  BP: 136/82  Pulse: 71  Temp: 97.9 F (36.6 C)  TempSrc:  Oral  Resp: 16  Weight: 218 lb 6.4 oz (99.066 kg)  SpO2: 97%    Body mass index is 32.24 kg/(m^2).  Physical Exam  Constitutional: Patient appears well-developed and well-nourished. Obese  No distress.  HEENT: head atraumatic, normocephalic, pupils equal and reactive to light, ears TM normal bilaterally, neck supple, throat within normal limits Cardiovascular: Normal rate, regular rhythm and normal heart sounds.  No murmur heard. No BLE edema. Pulmonary/Chest: Effort normal and breath sounds normal. No respiratory distress. Abdominal: Soft.  There is no tenderness. Psychiatric: Patient has a normal  mood and affect. behavior is normal. Judgment and thought content normal. Neurological: no nystagmus, Romberg negative, normal Tandem walk Skin: round lesion on left shoulder on the area of previous lipoma removal  Recent Results (from the past 2160 hour(s))  Comprehensive metabolic panel     Status: Abnormal   Collection Time: 06/07/15  9:35 AM  Result Value Ref Range   Glucose 108 (H) 65 - 99 mg/dL   BUN 8 8 - 27 mg/dL   Creatinine, Ser 0.68 (L) 0.76 - 1.27 mg/dL   GFR calc non Af Amer 102 >59 mL/min/1.73   GFR calc Af Amer 117 >59 mL/min/1.73   BUN/Creatinine Ratio 12 10 - 22   Sodium 140 136 - 144 mmol/L   Potassium 4.0 3.5 - 5.2 mmol/L   Chloride 102 97 - 106 mmol/L   CO2 25 18 - 29 mmol/L   Calcium 9.7 8.6 - 10.2 mg/dL   Total Protein 6.3 6.0 - 8.5 g/dL   Albumin 3.9 3.6 - 4.8 g/dL   Globulin, Total 2.4 1.5 - 4.5 g/dL   Albumin/Globulin Ratio 1.6 1.1 - 2.5   Bilirubin Total 1.1 0.0 - 1.2 mg/dL   Alkaline Phosphatase 100 39 - 117 IU/L   AST 22 0 - 40 IU/L   ALT 26 0 - 44 IU/L  Lipid panel     Status: Abnormal   Collection Time: 06/07/15  9:35 AM  Result Value Ref Range   Cholesterol, Total 149 100 - 199 mg/dL   Triglycerides 136 0 - 149 mg/dL   HDL 28 (L) >39 mg/dL    Comment: According to ATP-III Guidelines, HDL-C >59 mg/dL is considered a negative risk factor for  CHD.    VLDL Cholesterol Cal 27 5 - 40 mg/dL   LDL Calculated 94 0 - 99 mg/dL   Chol/HDL Ratio 5.3 (H) 0.0 - 5.0 ratio units    Comment:                                   T. Chol/HDL Ratio                                             Men  Women                               1/2 Avg.Risk  3.4    3.3                                   Avg.Risk  5.0    4.4                                2X Avg.Risk  9.6    7.1                                3X Avg.Risk 23.4   11.0   C-reactive protein     Status: None   Collection Time: 06/07/15  9:35 AM  Result Value Ref Range   CRP 2.1 0.0 - 4.9 mg/L  CBC with Differential/Platelet  Status: Abnormal   Collection Time: 06/07/15  9:35 AM  Result Value Ref Range   WBC 8.8 3.4 - 10.8 x10E3/uL   RBC 4.74 4.14 - 5.80 x10E6/uL   Hemoglobin 14.5 12.6 - 17.7 g/dL   Hematocrit 42.7 37.5 - 51.0 %   MCV 90 79 - 97 fL   MCH 30.6 26.6 - 33.0 pg   MCHC 34.0 31.5 - 35.7 g/dL   RDW 13.3 12.3 - 15.4 %   Platelets 330 150 - 379 x10E3/uL   Neutrophils 52 %   Lymphs 38 %   Monocytes 6 %   Eos 3 %   Basos 1 %   Neutrophils Absolute 4.6 1.4 - 7.0 x10E3/uL   Lymphocytes Absolute 3.4 (H) 0.7 - 3.1 x10E3/uL   Monocytes Absolute 0.5 0.1 - 0.9 x10E3/uL   EOS (ABSOLUTE) 0.3 0.0 - 0.4 x10E3/uL   Basophils Absolute 0.1 0.0 - 0.2 x10E3/uL   Immature Granulocytes 0 %   Immature Grans (Abs) 0.0 0.0 - 0.1 x10E3/uL  PSA     Status: None   Collection Time: 06/07/15  9:35 AM  Result Value Ref Range   Prostate Specific Ag, Serum 1.2 0.0 - 4.0 ng/mL    Comment: Roche ECLIA methodology. According to the American Urological Association, Serum PSA should decrease and remain at undetectable levels after radical prostatectomy. The AUA defines biochemical recurrence as an initial PSA value 0.2 ng/mL or greater followed by a subsequent confirmatory PSA value 0.2 ng/mL or greater. Values obtained with different assay methods or kits cannot be used interchangeably. Results cannot  be interpreted as absolute evidence of the presence or absence of malignant disease.   HIV antibody     Status: None   Collection Time: 06/07/15  9:35 AM  Result Value Ref Range   HIV Screen 4th Generation wRfx Non Reactive Non Reactive  Hepatitis C antibody     Status: None   Collection Time: 06/07/15  9:35 AM  Result Value Ref Range   Hep C Virus Ab <0.1 0.0 - 0.9 s/co ratio    Comment:                                   Negative:     < 0.8                              Indeterminate: 0.8 - 0.9                                   Positive:     > 0.9  The CDC recommends that a positive HCV antibody result  be followed up with a HCV Nucleic Acid Amplification  test [id number]).   POCT HgB A1C     Status: None   Collection Time: 07/23/15  8:48 AM  Result Value Ref Range   Hemoglobin A1C 6.4   I-STAT creatinine     Status: None   Collection Time: 08/23/15  9:46 AM  Result Value Ref Range   Creatinine, Ser 0.70 0.61 - 1.24 mg/dL    PHQ2/9: Depression screen [facility] 2/9 08/26/2015 07/08/2015 06/21/2015 03/28/2015  Decreased Interest 0 0 0 0  Down, Depressed, Hopeless 0 0 0 0  PHQ - 2 Score 0 0 0 0    Fall Risk: Fall Risk  08/26/2015 07/08/2015 06/21/2015 03/28/2015  Falls in the past year? Yes Yes Yes No  Number falls in past yr: 2 or more 2 or more 2 or more -  Injury with Fall? Yes Yes No -  Risk for fall due to : History of fall(s);Impaired balance/gait Other (Comment) Impaired balance/gait -  Follow up - Falls prevention discussed;Education provided;Falls evaluation completed - -     Functional Status Survey: Is the patient deaf or have difficulty hearing?: No Does the patient have difficulty seeing, even when wearing glasses/contacts?: No Does the patient have difficulty concentrating, remembering, or making decisions?: No Does the patient have difficulty walking or climbing stairs?: No Does the patient have difficulty dressing or bathing?: No Does the patient have difficulty doing  errands alone such as visiting a doctor's office or shopping?: No   Assessment & Plan  1. Hypertension goal BP (blood pressure) < 140/90  Continue medication   2. Hearing loss sensory, bilateral  Keep follow up with ENT  3. Depression, major, recurrent, mild ([facility])  Stable with medication  4. Hyperlipidemia  Continue medication   5. Dizziness of unknown cause  - Ambulatory referral to Neurology  6. Obstructive sleep apnea  - CPAP continuous - face mask; Future  7. Other headache syndrome  - Ambulatory referral to Neurology  8. Hypertension, benign  - amLODipine (NORVASC) 2.5 MG tablet; Take 1 tablet (2.5 mg total) by mouth daily.  Dispense: 30 tablet; Refill: 5  9. CAD in native artery  Continue Beta blocker, statin and aspirin   11. Skin lesion  - Ambulatory referral to General Surgery

## 2015-08-26 NOTE — Telephone Encounter (Signed)
Patient stated that he got a call from the ENT nurse stated that his MRI showed a lack of blood to the brain and that he needed to f/u with a neurologist. Patient was told that I would inform Dr. Ancil Boozer of that conversation but that i'm quite sure she has already reviewed his results and that there is nothing for him to be concerned about. He is to keep his appointment with Dr. Melrose Nakayama and then proceed as necessary.

## 2015-08-26 NOTE — Addendum Note (Signed)
Addended by: Johnnette Litter A on: 08/26/2015 11:10 AM   Modules accepted: Orders

## 2015-08-28 ENCOUNTER — Telehealth: Payer: Self-pay

## 2015-08-28 NOTE — Telephone Encounter (Signed)
Patient was reassured that his MRI results was fine and that he was going to be ok. He was encouraged to keep his appt with Dr. Melrose Nakayama and he said ok, thanks.

## 2015-08-29 ENCOUNTER — Emergency Department
Admission: EM | Admit: 2015-08-29 | Discharge: 2015-08-29 | Disposition: A | Discharge status: Home / Self Care | Payer: Medicaid Other | Attending: Emergency Medicine | Admitting: Emergency Medicine

## 2015-08-29 ENCOUNTER — Telehealth: Payer: Self-pay

## 2015-08-29 ENCOUNTER — Encounter: Payer: Self-pay | Admitting: *Deleted

## 2015-08-29 ENCOUNTER — Emergency Department: Payer: Medicaid Other

## 2015-08-29 DIAGNOSIS — I10 Essential (primary) hypertension: Secondary | ICD-10-CM | POA: Insufficient documentation

## 2015-08-29 DIAGNOSIS — Z79899 Other long term (current) drug therapy: Secondary | ICD-10-CM | POA: Diagnosis not present

## 2015-08-29 DIAGNOSIS — R2689 Other abnormalities of gait and mobility: Secondary | ICD-10-CM | POA: Insufficient documentation

## 2015-08-29 DIAGNOSIS — R51 Headache: Secondary | ICD-10-CM | POA: Diagnosis present

## 2015-08-29 DIAGNOSIS — E669 Obesity, unspecified: Secondary | ICD-10-CM | POA: Diagnosis not present

## 2015-08-29 DIAGNOSIS — Z87891 Personal history of nicotine dependence: Secondary | ICD-10-CM | POA: Insufficient documentation

## 2015-08-29 DIAGNOSIS — Z7982 Long term (current) use of aspirin: Secondary | ICD-10-CM | POA: Insufficient documentation

## 2015-08-29 DIAGNOSIS — R42 Dizziness and giddiness: Secondary | ICD-10-CM | POA: Insufficient documentation

## 2015-08-29 DIAGNOSIS — R2681 Unsteadiness on feet: Secondary | ICD-10-CM

## 2015-08-29 DIAGNOSIS — R519 Headache, unspecified: Secondary | ICD-10-CM

## 2015-08-29 LAB — TSH: TSH: 1.018 u[IU]/mL (ref 0.350–4.500)

## 2015-08-29 LAB — BASIC METABOLIC PANEL
ANION GAP: 7 (ref 5–15)
BUN: 10 mg/dL (ref 6–20)
CHLORIDE: 103 mmol/L (ref 101–111)
CO2: 25 mmol/L (ref 22–32)
Calcium: 9.1 mg/dL (ref 8.9–10.3)
Creatinine, Ser: 0.64 mg/dL (ref 0.61–1.24)
GFR calc Af Amer: >60 mL/min (ref >60)
GLUCOSE: 118 mg/dL — AB (ref 65–99)
POTASSIUM: 3.6 mmol/L (ref 3.5–5.1)
Sodium: 135 mmol/L (ref 135–145)

## 2015-08-29 LAB — CBC
HEMATOCRIT: 45.4 % (ref 40.0–52.0)
HEMOGLOBIN: 15.3 g/dL (ref 13.0–18.0)
MCH: 30.1 pg (ref 26.0–34.0)
MCHC: 33.8 g/dL (ref 32.0–36.0)
MCV: 89.1 fL (ref 80.0–100.0)
Platelets: 318 10*3/uL (ref 150–440)
RBC: 5.09 MIL/uL (ref 4.40–5.90)
RDW: 13.8 % (ref 11.5–14.5)
WBC: 9.4 10*3/uL (ref 3.8–10.6)

## 2015-08-29 LAB — SEDIMENTATION RATE: SED RATE: 8 mm/h (ref 0–20)

## 2015-08-29 MED ORDER — KETOROLAC TROMETHAMINE 30 MG/ML IJ SOLN
30.0000 mg | Freq: Once | INTRAMUSCULAR | Status: AC
  Administered 2015-08-29: 30 mg via INTRAVENOUS
  Filled 2015-08-29: qty 1

## 2015-08-29 MED ORDER — MECLIZINE HCL 25 MG PO TABS: 25.0000 mg | ORAL_TABLET | Freq: Three times a day (TID) | ORAL | Status: DC | PRN

## 2015-08-29 MED ORDER — DIAZEPAM 5 MG/ML IJ SOLN
5.0000 mg | Freq: Once | INTRAMUSCULAR | Status: DC
  Filled 2015-08-29: qty 2

## 2015-08-29 MED ORDER — ACETAMINOPHEN 500 MG PO TABS
1000.0000 mg | ORAL_TABLET | Freq: Once | ORAL | Status: DC
Start: 2015-08-29 — End: 2015-08-29
  Filled 2015-08-29: qty 2

## 2015-08-29 MED ORDER — MECLIZINE HCL 25 MG PO TABS
50.0000 mg | ORAL_TABLET | Freq: Once | ORAL | Status: AC
  Administered 2015-08-29: 50 mg via ORAL
  Filled 2015-08-29: qty 2

## 2015-08-29 NOTE — Discharge Instructions (Signed)
Please get plenty of rest, drink plenty of fluids to prevent dehydration, and eat small regular meals throughout the day. Please make a follow-up appointment with your regular primary care physician, and continue the evaluation of your symptoms with the neurologist.  Return to the emergency department if you develop severe headache, visual changes, fainting, new numbness tingling or weakness, chest pain or shortness of breath, or any other symptoms concerning to you.

## 2015-08-29 NOTE — ED Notes (Signed)
Arrives with complaints of dizziness and headache, pt states he was sent for an MRI by ENT, states they called him and was told he has a "very small bleeding vessel" and was told if his headache got worse to come to the ER, pt unsteady on his feet noticed upon him wlaking into triage room, pt awake and alert, speaking in full sentances

## 2015-08-29 NOTE — ED Notes (Signed)
Spoke to Dr. Burlene Arnt concerning pt, MD reviewed MRI and CT, no orders for CT or MRI at this time, blood work to be done

## 2015-08-29 NOTE — Telephone Encounter (Signed)
On 08/28/15 @ 9:36am  I spoke with Junie Panning with [employer] and she informed me that this patient was very non-compliant regarding their policy for having the CPAP machine. She stated that he no-showed several appointment and was about 1% compliant. She stated that in order for Medicaid to cover his machine and supplies he would have to do a start over. The therapist, Army Melia, went over with him the process and his expressed no interested and turned in his machine on 08/20/15.   Patient was seen in our office recently and told Dr. Ancil Boozer that he did not use the CPAP machine due to the mask not fitting properly. He stated that he wanted to use the machine but needed it to fit right.   Junie Panning stated that we would have to send in an order for a PSG sleep study, notes from recent visit stating why he was not compliant and why he needs the machine. Then they will fax the requested paperwork over to the Medicaid office to see if they will cover it.  I was going to contact this patient to see if he wanted to proceed, but I found that he was in the ER being seen for dizziness and headache, so I will check back with him later.

## 2015-08-29 NOTE — ED Notes (Signed)
Patient transported to CT 

## 2015-08-29 NOTE — ED Provider Notes (Signed)
[facility] Emergency Department Provider Note  ____________________________________________  Time seen: Approximately 1:25 PM  I have reviewed the triage vital signs and the nursing notes.   HISTORY  Chief Complaint Dizziness and Headache    HPI Jimmy Green is a 65 y.o. male w/ a hx of CAD, HTN, HL and metabolic syndrome presenting w/ HA and dizziness.  Patient has been under evaluation for chronic dizziness since October. He has been seen by his primary care physician, as well as ENT. He has an appointment with neurology at the end of February. He has been treated with meclizine, which improved his symptoms but he stopped this medication because he was told he did not have vertigo.6 days ago, patient underwent MRI of the brain which showed atrophy and chronic small microvascular ischemic changes with no acute intracranial process. Yesterday, the patient developed a headache over the forehead and behind the eyes with associated dizziness. His dizziness makes it difficult to walk and he is bumping into things. He denies any fever, stiff neck, nausea or vomiting, numbness tingling or focal weakness. He has not had any chest pain, shortness of breath. No other recent illness or changes in his medications.  There is some confusion whereby the patient and his wife believe that they were told he had bleeding in his brain; there is no imaging in our system to suggest that this is the case.  Past Medical History  Diagnosis Date  . Hyperlipidemia   . Hypertension   . Coronary artery disease   . Skin cancer 2014    nose and right knee  . Depression   . GERD (gastroesophageal reflux disease)   . Hypogonadism in male   . Metabolic syndrome     Patient Active Problem List   Diagnosis Date Noted  . Hearing loss sensory, bilateral 08/26/2015  . Cerebral microvascular disease 08/26/2015  . Metabolic syndrome [id number]  . Vertigo 06/21/2015  . Buzzing in ear  06/21/2015  . Hypertension goal BP (blood pressure) < 140/90 03/28/2015  . GERD (gastroesophageal reflux disease) 03/28/2015  . Hyperlipidemia 03/28/2015  . 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 CAD in native artery 03/28/2015  . Actinic keratosis 02/14/2015  . Obstructive sleep apnea 02/03/2015  . Ileitis 01/31/2015  . Diverticulosis of colon without hemorrhage 01/31/2015  . Hx of colonic polyps   . Benign neoplasm of descending colon   . Benign neoplasm of sigmoid colon   . Ulceration of intestine   . Idiopathic colitis   . Sebaceous cyst 09/14/2014    Past Surgical History  Procedure Laterality Date  . Hernia repair    . Coronary stent placement    . Left heart catheterization with coronary angiogram N/A 10/08/2011    Procedure: LEFT HEART CATHETERIZATION WITH CORONARY ANGIOGRAM;  Surgeon: Clent Demark, MD;  Location: [facility] CATH LAB;  Service: Cardiovascular;  Laterality: N/A;  . Fractional flow reserve wire  10/08/2011    Procedure: FRACTIONAL FLOW RESERVE WIRE;  Surgeon: Clent Demark, MD;  Location: [place] CATH LAB;  Service: Cardiovascular;;  . Colonoscopy N/A 01/15/2015    Wohl-ileitis, 2 benign polyps, cryptitis, sigmoid diverticulosis, focal ulceration ICV    Current Outpatient Rx  Name  Route  Sig  Dispense  Refill  . amLODipine (NORVASC) 2.5 MG tablet   Oral   Take 1 tablet (2.5 mg total) by mouth daily.   30 tablet   5     New dose   . aspirin EC 81 MG  tablet   Oral   Take 81 mg by mouth every morning.          [name] atorvastatin (LIPITOR) 40 MG tablet   Oral   Take 1 tablet (40 mg total) by mouth daily.   30 tablet   3   . buPROPion (WELLBUTRIN XL) 150 MG 24 hr tablet   Oral   Take 3 tablets (450 mg total) by mouth daily.   90 tablet   3   . escitalopram (LEXAPRO) 20 MG tablet   Oral   Take 1 tablet (20 mg total) by mouth daily.   30 tablet   3   . ibuprofen (ADVIL,MOTRIN) 800 MG tablet            1   . losartan-hydrochlorothiazide (HYZAAR)  100-12.5 MG tablet   Oral   Take 1 tablet by mouth daily.   30 tablet   3   . meclizine (ANTIVERT) 32 MG tablet   Oral   Take 1 tablet (32 mg total) by mouth 3 (three) times daily as needed.   30 tablet   0   . metoprolol succinate (TOPROL-XL) 50 MG 24 hr tablet   Oral   Take 1 tablet (50 mg total) by mouth every evening.   30 tablet   3   . nitroGLYCERIN (NITROSTAT) 0.4 MG SL tablet   Sublingual   Place 1 tablet (0.4 mg total) under the tongue every 5 (five) minutes as needed. For chest pain.   30 tablet   0   . Omeprazole 20 MG TBEC   Oral   Take 1 tablet (20 mg total) by mouth daily.   30 tablet   3     Allergies Review of patient's allergies indicates no known allergies.  Family History  Problem Relation Age of Onset  . Lung cancer Father   . Heart disease Father   . Skin cancer Father     Social History Social History  Substance Use Topics  . Smoking status: Former Smoker -- 40 years    Types: Cigarettes    Quit date: 08/03/2012  . Smokeless tobacco: Never Used  . Alcohol Use: No    Review of Systems Constitutional: No fever/chills. Positive lightheadedness. Positive dizziness. Negative recent trauma. Eyes: No visual changes. No blurred or double vision. No discharge from the eyes. ENT: No sore throat. Cardiovascular: Denies chest pain, palpitations. Respiratory: Denies shortness of breath.  No cough. Gastrointestinal: No abdominal pain.  No nausea, no vomiting.  No diarrhea.  No constipation. Genitourinary: Negative for dysuria. Musculoskeletal: Negative for back pain. Skin: Negative for rash. Neurological: Positive for headache. Negative for focal weakness. Positive for dizziness and gait imbalance due to dizziness. No confusion, visual changes, or changes in speech. No numbness or tingling.  10-point ROS otherwise negative.  ____________________________________________   PHYSICAL EXAM:  VITAL SIGNS: ED Triage Vitals  Enc Vitals Group      BP 08/29/15 1137 155/88 mmHg     Pulse Rate 08/29/15 1137 75     Resp 08/29/15 1137 18     Temp 08/29/15 1137 98 F (36.7 C)     Temp Source 08/29/15 1137 Oral     SpO2 08/29/15 1137 97 %     Weight 08/29/15 1137 218 lb (98.884 kg)     Height 08/29/15 1137 5\' 9"  (1.753 m)     Head Cir --      Peak Flow --      Pain Score 08/29/15 1137  8     Pain Loc --      Pain Edu? --      Excl. in [place]? --     Constitutional: Alert and oriented. Well appearing and in no acute distress. Answer question appropriately. Eyes: Conjunctivae are normal.  EOMI. no nystagmus. PERRLA. Head: Atraumatic. No raccoon eyes or Battle sign. Nose: No congestion/rhinnorhea. Mouth/Throat: Mucous membranes are moist.  Neck: No stridor.  Supple.  No JVD. Cardiovascular: Normal rate, regular rhythm. No murmurs, rubs or gallops.  Respiratory: Normal respiratory effort.  No retractions. Lungs CTAB.  No wheezes, rales or ronchi. Gastrointestinal: Obese. Soft and nontender. No distention. No peritoneal signs. Musculoskeletal: No LE edema.  Neurologic: Alert and oriented 3. Speech is clear.  Face and smile symmetric. EOMI and PERRLA. Right lateral gaze increases headache.  No nystagmus. Tongue is midline.  No pronator drift. 5 out of 5 grip, biceps, triceps, hip flexors, plantar flexion and dorsiflexion. Normal sensation to light touch in the bilateral upper and lower extremities, and face. Normal heel-to-shin. Skin:  Skin is warm, dry and intact. No rash noted. Psychiatric: Mood and affect are normal. Speech and behavior are normal.  Normal judgement.  ____________________________________________   LABS (all labs ordered are listed, but only abnormal results are displayed)  Labs Reviewed  BASIC METABOLIC PANEL - Abnormal; Notable for the following:    Glucose, Bld 118 (*)    All other components within normal limits  CBC  TSH  SEDIMENTATION RATE  CBG MONITORING, ED    ____________________________________________  EKG  ED ECG REPORT I, Eula Listen, the attending physician, personally viewed and interpreted this ECG.   Date: 08/29/2015  EKG Time: 1140  Rate: 73  Rhythm: normal sinus rhythm  Axis: Leftward  Intervals:none  ST&T Change: Nonspecific T-wave inversions in V1 and V2. No ST elevation. No ischemic changes.  ____________________________________________  RADIOLOGY  Ct Head Wo Contrast  08/29/2015  CLINICAL DATA:  65 year old presenting with recurrent acute dizziness and headache, original episodes began approximately 5 months ago. Unsteady gait upon arrival to the emergency department. Current history of hypertension, hyperlipidemia and coronary artery disease. EXAM: CT HEAD WITHOUT CONTRAST TECHNIQUE: Contiguous axial images were obtained from the base of the skull through the vertex without intravenous contrast. COMPARISON:  MRI brain 08/23/2015.  CT head 07/01/2015. FINDINGS: Ventricular system normal in size and appearance for age. Mild changes of small vessel disease of the white matter diffusely with old more focal lacunar type strokes in the subcortical white matter of both frontal lobes, unchanged. No mass lesion. No midline shift. No acute hemorrhage or hematoma. No extra-axial fluid collections. No evidence of acute infarction. No new parenchymal abnormality. No skull fracture or other focal osseous abnormality involving the skull. Visualized paranasal sinuses, bilateral mastoid air cells and bilateral middle ear cavities well-aerated. Mild bilateral carotid siphon and vertebral artery atherosclerosis. IMPRESSION: 1. No acute intracranial abnormality. 2. 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 Electronically Signed   By: Evangeline Dakin M.D.   On: 08/29/2015 13:42     ____________________________________________   PROCEDURES  Procedure(s) performed: None  Critical Care performed: No ____________________________________________   INITIAL IMPRESSION / ASSESSMENT AND PLAN / ED COURSE  Pertinent labs & imaging results that were available during my care of the patient were reviewed by me and considered in my medical decision making (see chart for details).  65 y.o. male with  a history of CAD, HTN, HL presenting with acute on chronic headache and dizziness. I do not see any focal findings neurologically on my exam. We'll get a CT scan given that the patient has a worsening headache, but it is unlikely he has a mass as this was not seen on MRI 6 days ago, intracranial bleeding or subarachnoid hemorrhage, or acute stroke. Consider vertigo, atypical migraines although he is at an advanced age to have new onset migraines. Consider POTS but again this would be a neurologic diagnosis and he has had advanced age for having this. The patient may also have vestibular disease. We will plan to rule out any acute emergencies in the emergency department, and have the patient proceed with his neurologic evaluation. Given that he has had relief with meclizine in the past, we'll try this again to see if we can improve his symptoms.  ----------------------------------------- 2:24 PM on 08/29/2015 -----------------------------------------  The patient reports that his headache is "all but gone." He still has some mild pain behind the right eye. After meclizine, he does not feel like his dizziness has improved. I will try Valium for this as well as additional Tylenol to continue to attempt to resolve the headache. The patient's labs are reassuring, I'm still waiting for a sedimentation rate and thyroid panel. His CT scan does not show any acute abnormalities; he has some old infarcts.  ----------------------------------------- 2:52 PM on  08/29/2015 -----------------------------------------  Without any additional intervention, the patient states that now his dizziness has significantly improved, and his headache has resolved. I will plan to discharge the patient home and have him into to follow-up with his primary care physician and keep his appointment with the neurologist. The patient and his family understand return precautions as well as follow-up instructions.  ____________________________________________  FINAL CLINICAL IMPRESSION(S) / ED DIAGNOSES  Final diagnoses:  Nonintractable episodic headache, unspecified headache type  Dizziness  Gait instability      NEW MEDICATIONS STARTED DURING THIS VISIT:  New Prescriptions   No medications on file     Eula Listen, MD 08/29/15 1453

## 2015-08-29 NOTE — ED Notes (Signed)
Lab called with TSH add-on

## 2015-09-02 ENCOUNTER — Ambulatory Visit: Payer: Medicaid Other | Admitting: Family Medicine

## 2015-09-03 ENCOUNTER — Ambulatory Visit (INDEPENDENT_AMBULATORY_CARE_PROVIDER_SITE_OTHER): Payer: Medicaid Other | Admitting: General Surgery

## 2015-09-03 ENCOUNTER — Ambulatory Visit (INDEPENDENT_AMBULATORY_CARE_PROVIDER_SITE_OTHER): Payer: Medicaid Other | Admitting: Family Medicine

## 2015-09-03 ENCOUNTER — Encounter: Payer: Self-pay | Admitting: Family Medicine

## 2015-09-03 ENCOUNTER — Encounter: Payer: Self-pay | Admitting: General Surgery

## 2015-09-03 VITALS — BP 122/70 | HR 74 | Resp 14 | Ht 68.0 in | Wt 218.0 lb

## 2015-09-03 VITALS — BP 122/70 | HR 71 | Temp 98.1°F | Resp 12 | Wt 218.0 lb

## 2015-09-03 DIAGNOSIS — L723 Sebaceous cyst: Secondary | ICD-10-CM | POA: Diagnosis not present

## 2015-09-03 DIAGNOSIS — R51 Headache: Secondary | ICD-10-CM

## 2015-09-03 DIAGNOSIS — R519 Headache, unspecified: Secondary | ICD-10-CM

## 2015-09-03 MED ORDER — PREDNISONE 20 MG PO TABS
20.0000 mg | ORAL_TABLET | Freq: Every day | ORAL | Status: DC
Start: 2015-09-03 — End: 2015-10-22

## 2015-09-03 MED ORDER — TRAMADOL HCL 50 MG PO TABS: 50.0000 mg | ORAL_TABLET | Freq: Three times a day (TID) | ORAL | Status: DC | PRN

## 2015-09-03 MED ORDER — NORTRIPTYLINE HCL 10 MG PO CAPS: 10.0000 mg | ORAL_CAPSULE | Freq: Every day | ORAL | Status: DC

## 2015-09-03 NOTE — Progress Notes (Signed)
Patient ID: Jimmy Green, male   DOB: July 27, 1951, 65 y.o.   MRN: [id number]  Chief Complaint  Patient presents with  . Other    cyst    HPI Jimmy Green is a 65 y.o. male here today for a evaluation of a cyst left shoulder. He states the area has came back in the last two weeks. Last office visit was 09/13/14 for removal of a basal cell carcinoma.  The patient reports that he is noted some thickening and nodularity at the lateral aspect of the incision.  I personally reviewed the patient's history.   Jimmy KitchenHPI  Past Medical History  Diagnosis Date  . Hyperlipidemia   . Hypertension   . Coronary artery disease   . Skin cancer 2014    nose and right knee  . Depression   . GERD (gastroesophageal reflux disease)   . Hypogonadism in male   . Metabolic syndrome     Past Surgical History  Procedure Laterality Date  . Hernia repair    . Coronary stent placement    . Left heart catheterization with coronary angiogram N/A 10/08/2011    Procedure: LEFT HEART CATHETERIZATION WITH CORONARY ANGIOGRAM;  Surgeon: Clent Demark, MD;  Location: [facility] CATH LAB;  Service: Cardiovascular;  Laterality: N/A;  . Fractional flow reserve wire  10/08/2011    Procedure: FRACTIONAL FLOW RESERVE WIRE;  Surgeon: Clent Demark, MD;  Location: [place] CATH LAB;  Service: Cardiovascular;;  . Colonoscopy N/A 01/15/2015    Wohl-ileitis, 2 benign polyps, cryptitis, sigmoid diverticulosis, focal ulceration ICV    Family History  Problem Relation Age of Onset  . Lung cancer Father   . Heart disease Father   . Skin cancer Father     Social History Social History  Substance Use Topics  . Smoking status: Former Smoker -- 40 years    Types: Cigarettes    Quit date: 08/03/2012  . Smokeless tobacco: Never Used  . Alcohol Use: No    No Known Allergies  Current Outpatient Prescriptions  Medication Sig Dispense Refill  . amLODipine (NORVASC) 2.5 MG tablet Take 1 tablet (2.5 mg total) by mouth daily. 30 tablet 5   . aspirin EC 81 MG tablet Take 81 mg by mouth every morning.     Jimmy Green atorvastatin (LIPITOR) 40 MG tablet Take 1 tablet (40 mg total) by mouth daily. 30 tablet 3  . buPROPion (WELLBUTRIN XL) 150 MG 24 hr tablet Take 3 tablets (450 mg total) by mouth daily. 90 tablet 3  . escitalopram (LEXAPRO) 20 MG tablet Take 1 tablet (20 mg total) by mouth daily. 30 tablet 3  . ibuprofen (ADVIL,MOTRIN) 800 MG tablet   1  . losartan-hydrochlorothiazide (HYZAAR) 100-12.5 MG tablet Take 1 tablet by mouth daily. 30 tablet 3  . meclizine (ANTIVERT) 25 MG tablet Take 1 tablet (25 mg total) by mouth 3 (three) times daily as needed for dizziness (May take 1-2 tablets every 8 hours as needed for dizziness.). 20 tablet 0  . metoprolol succinate (TOPROL-XL) 50 MG 24 hr tablet Take 1 tablet (50 mg total) by mouth every evening. 30 tablet 3  . nitroGLYCERIN (NITROSTAT) 0.4 MG SL tablet Place 1 tablet (0.4 mg total) under the tongue every 5 (five) minutes as needed. For chest pain. 30 tablet 0  . Omeprazole 20 MG TBEC Take 1 tablet (20 mg total) by mouth daily. 30 tablet 3  . nortriptyline (PAMELOR) 10 MG capsule Take 1 capsule (10 mg total) by mouth at  bedtime. 30 capsule 0  . predniSONE (DELTASONE) 20 MG tablet Take 1 tablet (20 mg total) by mouth daily with breakfast. 4 tablet 0  . traMADol (ULTRAM) 50 MG tablet Take 1 tablet (50 mg total) by mouth every 8 (eight) hours as needed. 20 tablet 0   No current facility-administered medications for this visit.    Review of Systems Review of Systems  Constitutional: Negative.   Respiratory: Negative.   Cardiovascular: Negative.     Blood pressure 122/70, pulse 74, resp. rate 14, height 5\' 8"  (1.727 m), weight 218 lb (98.884 kg).  Physical Exam Physical Exam  Constitutional: He is oriented to person, place, and time. He appears well-developed and well-nourished.  Cardiovascular: Normal rate, regular rhythm and normal heart sounds.   Lymphadenopathy:       Left  axillary: No pectoral and no lateral adenopathy present. Neurological: He is alert and oriented to person, place, and time.  Skin: Skin is warm and dry.       Data Reviewed Skin (M), left shoulder mass, re-excision EXCISION, RESIDUAL BASAL CELL CARCINOMA, MARGINS FREE Microscopic Description The specimen demonstrates atypical basaloid cells within the dermis, consistent with residual basal cell carcinoma. Previous biopsy site changes are also present. The surgical margins are free of malignancy. Jimmy Dexter MD Ph.D. Dermatopathologist, Electronic  Assessment    Likely sebaceous cyst within the previous scar, less likely recurrent basal cell carcinoma.     Plan    The patient climbed reexcision today. This will be scheduled at a convenient date.    Patient to return for left shoulder excision  PCP:  [facility], This information has been scribed by Gaspar Cola CMA.    Jimmy Green 09/04/2015, 4:31 PM

## 2015-09-03 NOTE — Patient Instructions (Signed)
Patient to return for left shoulder excision

## 2015-09-03 NOTE — Progress Notes (Signed)
Name: Jimmy Green   MRN: [id number]    DOB: Mar 13, 1951   Date:09/03/2015       Progress Note  Subjective  Chief Complaint  Chief Complaint  Patient presents with  . Follow-up    patient was given a toradol injection an it really helped with his headache.     HPI  Headache Intractable: he has been seen by ENT for dizziness since October 2016, multiple test have been done, has follow up with neurologist at the end of this month. He went to [facility] on 1/26 because of worsening of headache and because when he received the MRI report he understood that he had bleeding in his brain and was very concerned about it. He states is described as dull/throbbing sensation, constant and radiates to right frontal area and behind right eye. He is sensitive to light, but no nausea or vomiting. He continues to feel dizzy and has difficulty ambulating at times. He states Ketorolac at the [facility] helped with pain, however he drove to our office and we will not give him any medication that causes sedation today   Patient Active Problem List   Diagnosis Date Noted  . Hearing loss sensory, bilateral 08/26/2015  . Cerebral microvascular disease 08/26/2015  . Metabolic syndrome 61/95/0932  . Vertigo 06/21/2015  . Buzzing in ear 06/21/2015  . Hypertension goal BP (blood pressure) < 140/90 03/28/2015  . GERD (gastroesophageal reflux disease) 03/28/2015  . Hyperlipidemia 03/28/2015  . Depression, major ([place]) 03/28/2015  . CAD in native artery 03/28/2015  . Actinic keratosis 02/14/2015  . Obstructive sleep apnea 02/03/2015  . Ileitis 01/31/2015  . Diverticulosis of colon without hemorrhage 01/31/2015  . Hx of colonic polyps   . Benign neoplasm of descending colon   . Benign neoplasm of sigmoid colon   . Ulceration of intestine   . Idiopathic colitis   . Sebaceous cyst 09/14/2014    Past Surgical History  Procedure Laterality Date  . Hernia repair    . Coronary stent placement    . Left heart catheterization  with coronary angiogram N/A 10/08/2011    Procedure: LEFT HEART CATHETERIZATION WITH CORONARY ANGIOGRAM;  Surgeon: Clent Demark, MD;  Location: [facility] CATH LAB;  Service: Cardiovascular;  Laterality: N/A;  . Fractional flow reserve wire  10/08/2011    Procedure: FRACTIONAL FLOW RESERVE WIRE;  Surgeon: Clent Demark, MD;  Location: [facility] CATH LAB;  Service: Cardiovascular;;  . Colonoscopy N/A 01/15/2015    Wohl-ileitis, 2 benign polyps, cryptitis, sigmoid diverticulosis, focal ulceration ICV    Family History  Problem Relation Age of Onset  . Lung cancer Father   . Heart disease Father   . Skin cancer Father     Social History   Social History  . Marital Status: Married    Spouse Name: N/A  . Number of Children: N/A  . Years of Education: N/A   Occupational History  . Not on file.   Social History Main Topics  . Smoking status: Former Smoker -- 40 years    Types: Cigarettes    Quit date: 08/03/2012  . Smokeless tobacco: Never Used  . Alcohol Use: No  . Drug Use: No  . Sexual Activity:    Partners: Female   Other Topics Concern  . Not on file   Social History Narrative     Current outpatient prescriptions:  .  amLODipine (NORVASC) 2.5 MG tablet, Take 1 tablet (2.5 mg total) by mouth daily., Disp: 30 tablet, Rfl: 5 .  aspirin EC 81 MG tablet, Take 81 mg by mouth every morning. , Disp: , Rfl:  .  atorvastatin (LIPITOR) 40 MG tablet, Take 1 tablet (40 mg total) by mouth daily., Disp: 30 tablet, Rfl: 3 .  buPROPion (WELLBUTRIN XL) 150 MG 24 hr tablet, Take 3 tablets (450 mg total) by mouth daily., Disp: 90 tablet, Rfl: 3 .  escitalopram (LEXAPRO) 20 MG tablet, Take 1 tablet (20 mg total) by mouth daily., Disp: 30 tablet, Rfl: 3 .  ibuprofen (ADVIL,MOTRIN) 800 MG tablet, , Disp: , Rfl: 1 .  losartan-hydrochlorothiazide (HYZAAR) 100-12.5 MG tablet, Take 1 tablet by mouth daily., Disp: 30 tablet, Rfl: 3 .  meclizine (ANTIVERT) 25 MG tablet, Take 1 tablet (25 mg total) by mouth 3  (three) times daily as needed for dizziness (May take 1-2 tablets every 8 hours as needed for dizziness.)., Disp: 20 tablet, Rfl: 0 .  metoprolol succinate (TOPROL-XL) 50 MG 24 hr tablet, Take 1 tablet (50 mg total) by mouth every evening., Disp: 30 tablet, Rfl: 3 .  nitroGLYCERIN (NITROSTAT) 0.4 MG SL tablet, Place 1 tablet (0.4 mg total) under the tongue every 5 (five) minutes as needed. For chest pain., Disp: 30 tablet, Rfl: 0 .  nortriptyline (PAMELOR) 10 MG capsule, Take 1 capsule (10 mg total) by mouth at bedtime., Disp: 30 capsule, Rfl: 0 .  Omeprazole 20 MG TBEC, Take 1 tablet (20 mg total) by mouth daily., Disp: 30 tablet, Rfl: 3 .  predniSONE (DELTASONE) 20 MG tablet, Take 1 tablet (20 mg total) by mouth daily with breakfast., Disp: 4 tablet, Rfl: 0 .  traMADol (ULTRAM) 50 MG tablet, Take 1 tablet (50 mg total) by mouth every 8 (eight) hours as needed., Disp: 20 tablet, Rfl: 0  No Known Allergies   ROS  Ten systems reviewed and is negative except as mentioned in HPI   Objective  Filed Vitals:   09/03/15 1449  BP: 122/70  Pulse: 71  Temp: 98.1 F (36.7 C)  TempSrc: Oral  Resp: 12  Weight: 218 lb (98.884 kg)  SpO2: 96%    Body mass index is 33.15 kg/(m^2).  Physical Exam   Constitutional: Patient appears well-developed and well-nourished. Obese  No distress.  HEENT: head atraumatic, normocephalic, pupils equal and reactive to light, neck supple, throat within normal limits Cardiovascular: Normal rate, regular rhythm and normal heart sounds.  No murmur heard. No BLE edema. Pulmonary/Chest: Effort normal and breath sounds normal. No respiratory distress. Abdominal: Soft.  There is no tenderness. Psychiatric: Patient has a normal mood and affect. behavior is normal. Judgment and thought content normal. Neurological: normal cranial nerves, Romberg negative, normal grip and balance during exam  Recent Results (from the past 2160 hour(s))  Comprehensive metabolic panel      Status: Abnormal   Collection Time: 06/07/15  9:35 AM  Result Value Ref Range   Glucose 108 (H) 65 - 99 mg/dL   BUN 8 8 - 27 mg/dL   Creatinine, Ser 0.68 (L) 0.76 - 1.27 mg/dL   GFR calc non Af Amer 102 >59 mL/min/1.73   GFR calc Af Amer 117 >59 mL/min/1.73   BUN/Creatinine Ratio 12 10 - 22   Sodium 140 136 - 144 mmol/L   Potassium 4.0 3.5 - 5.2 mmol/L   Chloride 102 97 - 106 mmol/L   CO2 25 18 - 29 mmol/L   Calcium 9.7 8.6 - 10.2 mg/dL   Total Protein 6.3 6.0 - 8.5 g/dL   Albumin 3.9 3.6 - 4.8  g/dL   Globulin, Total 2.4 1.5 - 4.5 g/dL   Albumin/Globulin Ratio 1.6 1.1 - 2.5   Bilirubin Total 1.1 0.0 - 1.2 mg/dL   Alkaline Phosphatase 100 39 - 117 IU/L   AST 22 0 - 40 IU/L   ALT 26 0 - 44 IU/L  Lipid panel     Status: Abnormal   Collection Time: 06/07/15  9:35 AM  Result Value Ref Range   Cholesterol, Total 149 100 - 199 mg/dL   Triglycerides 136 0 - 149 mg/dL   HDL 28 (L) >39 mg/dL    Comment: According to ATP-III Guidelines, HDL-C >59 mg/dL is considered a negative risk factor for CHD.    VLDL Cholesterol Cal 27 5 - 40 mg/dL   LDL Calculated 94 0 - 99 mg/dL   Chol/HDL Ratio 5.3 (H) 0.0 - 5.0 ratio units    Comment:                                   T. Chol/HDL Ratio                                             Men  Women                               1/2 Avg.Risk  3.4    3.3                                   Avg.Risk  5.0    4.4                                2X Avg.Risk  9.6    7.1                                3X Avg.Risk 23.4   11.0   C-reactive protein     Status: None   Collection Time: 06/07/15  9:35 AM  Result Value Ref Range   CRP 2.1 0.0 - 4.9 mg/L  CBC with Differential/Platelet     Status: Abnormal   Collection Time: 06/07/15  9:35 AM  Result Value Ref Range   WBC 8.8 3.4 - 10.8 x10E3/uL   RBC 4.74 4.14 - 5.80 x10E6/uL   Hemoglobin 14.5 12.6 - 17.7 g/dL   Hematocrit 42.7 37.5 - 51.0 %   MCV 90 79 - 97 fL   MCH 30.6 26.6 - 33.0 pg   MCHC 34.0 31.5 -  35.7 g/dL   RDW 13.3 12.3 - 15.4 %   Platelets 330 150 - 379 x10E3/uL   Neutrophils 52 %   Lymphs 38 %   Monocytes 6 %   Eos 3 %   Basos 1 %   Neutrophils Absolute 4.6 1.4 - 7.0 x10E3/uL   Lymphocytes Absolute 3.4 (H) 0.7 - 3.1 x10E3/uL   Monocytes Absolute 0.5 0.1 - 0.9 x10E3/uL   EOS (ABSOLUTE) 0.3 0.0 - 0.4 x10E3/uL   Basophils Absolute 0.1 0.0 - 0.2 x10E3/uL   Immature Granulocytes 0 %   Immature Grans (Abs)  0.0 0.0 - 0.1 x10E3/uL  PSA     Status: None   Collection Time: 06/07/15  9:35 AM  Result Value Ref Range   Prostate Specific Ag, Serum 1.2 0.0 - 4.0 ng/mL    Comment: Roche ECLIA methodology. According to the American Urological Association, Serum PSA should decrease and remain at undetectable levels after radical prostatectomy. The AUA defines biochemical recurrence as an initial PSA value 0.2 ng/mL or greater followed by a subsequent confirmatory PSA value 0.2 ng/mL or greater. Values obtained with different assay methods or kits cannot be used interchangeably. Results cannot be interpreted as absolute evidence of the presence or absence of malignant disease.   HIV antibody     Status: None   Collection Time: 06/07/15  9:35 AM  Result Value Ref Range   HIV Screen 4th Generation wRfx Non Reactive Non Reactive  Hepatitis C antibody     Status: None   Collection Time: 06/07/15  9:35 AM  Result Value Ref Range   Hep C Virus Ab <0.1 0.0 - 0.9 s/co ratio    Comment:                                   Negative:     < 0.8                              Indeterminate: 0.8 - 0.9                                   Positive:     > 0.9  The CDC recommends that a positive HCV antibody result  be followed up with a HCV Nucleic Acid Amplification  test (800349).   POCT HgB A1C     Status: None   Collection Time: 07/23/15  8:48 AM  Result Value Ref Range   Hemoglobin A1C 6.4   I-STAT creatinine     Status: None   Collection Time: 08/23/15  9:46 AM  Result Value Ref Range    Creatinine, Ser 0.70 0.61 - 1.24 mg/dL  Basic metabolic panel     Status: Abnormal   Collection Time: 08/29/15 11:47 AM  Result Value Ref Range   Sodium 135 135 - 145 mmol/L   Potassium 3.6 3.5 - 5.1 mmol/L   Chloride 103 101 - 111 mmol/L   CO2 25 22 - 32 mmol/L   Glucose, Bld 118 (H) 65 - 99 mg/dL   BUN 10 6 - 20 mg/dL   Creatinine, Ser 0.64 0.61 - 1.24 mg/dL   Calcium 9.1 8.9 - 10.3 mg/dL   GFR calc non Af Amer >60 >60 mL/min   GFR calc Af Amer >60 >60 mL/min    Comment: (NOTE) The eGFR has been calculated using the CKD EPI equation. This calculation has not been validated in all clinical situations. eGFR's persistently <60 mL/min signify possible Chronic Kidney Disease.    Anion gap 7 5 - 15  CBC     Status: None   Collection Time: 08/29/15 11:47 AM  Result Value Ref Range   WBC 9.4 3.8 - 10.6 K/uL   RBC 5.09 4.40 - 5.90 MIL/uL   Hemoglobin 15.3 13.0 - 18.0 g/dL   HCT 45.4 40.0 - 52.0 %   MCV 89.1 80.0 - 100.0 fL  MCH 30.1 26.0 - 34.0 pg   MCHC 33.8 32.0 - 36.0 g/dL   RDW 13.8 11.5 - 14.5 %   Platelets 318 150 - 440 K/uL  TSH     Status: None   Collection Time: 08/29/15 11:47 AM  Result Value Ref Range   TSH 1.018 0.350 - 4.500 uIU/mL  Sedimentation rate     Status: None   Collection Time: 08/29/15 11:47 AM  Result Value Ref Range   Sed Rate 8 0 - 20 mm/hr     PHQ2/9: Depression screen [facility] 2/9 09/03/2015 08/26/2015 07/08/2015 06/21/2015 03/28/2015  Decreased Interest 0 0 0 0 0  Down, Depressed, Hopeless 0 0 0 0 0  PHQ - 2 Score 0 0 0 0 0    Fall Risk: Fall Risk  09/03/2015 08/26/2015 07/08/2015 06/21/2015 03/28/2015  Falls in the past year? Yes Yes Yes Yes No  Number falls in past yr: 2 or more 2 or more 2 or more 2 or more -  Injury with Fall? Yes Yes Yes No -  Risk for fall due to : History of fall(s) History of fall(s);Impaired balance/gait Other (Comment) Impaired balance/gait -  Follow up - - Falls prevention discussed;Education provided;Falls evaluation  completed - -     Functional Status Survey: Is the patient deaf or have difficulty hearing?: No Does the patient have difficulty seeing, even when wearing glasses/contacts?: No Does the patient have difficulty concentrating, remembering, or making decisions?: No Does the patient have difficulty walking or climbing stairs?: No Does the patient have difficulty dressing or bathing?: No Does the patient have difficulty doing errands alone such as visiting a doctor's office or shopping?: No    Assessment & Plan  1. Intractable headache  We will start Nortriptyline to see if headache frequency goes down, prednisone to control inflammation/pain and tramadol prn for pain, keep follow up with Neurologist - predniSONE (DELTASONE) 20 MG tablet; Take 1 tablet (20 mg total) by mouth daily with breakfast.  Dispense: 4 tablet; Refill: 0 - traMADol (ULTRAM) 50 MG tablet; Take 1 tablet (50 mg total) by mouth every 8 (eight) hours as needed.  Dispense: 20 tablet; Refill: 0 - nortriptyline (PAMELOR) 10 MG capsule; Take 1 capsule (10 mg total) by mouth at bedtime.  Dispense: 30 capsule; Refill: 0

## 2015-09-25 DIAGNOSIS — G44221 Chronic tension-type headache, intractable: Secondary | ICD-10-CM | POA: Insufficient documentation

## 2015-09-25 DIAGNOSIS — H8113 Benign paroxysmal vertigo, bilateral: Secondary | ICD-10-CM | POA: Insufficient documentation

## 2015-10-22 ENCOUNTER — Ambulatory Visit (INDEPENDENT_AMBULATORY_CARE_PROVIDER_SITE_OTHER): Payer: Medicaid Other | Admitting: Family Medicine

## 2015-10-22 ENCOUNTER — Encounter: Payer: Self-pay | Admitting: Family Medicine

## 2015-10-22 VITALS — BP 138/70 | HR 73 | Temp 98.1°F | Resp 16 | Ht 68.0 in | Wt 221.0 lb

## 2015-10-22 DIAGNOSIS — R42 Dizziness and giddiness: Secondary | ICD-10-CM

## 2015-10-22 DIAGNOSIS — I2510 Atherosclerotic heart disease of native coronary artery without angina pectoris: Secondary | ICD-10-CM | POA: Diagnosis not present

## 2015-10-22 NOTE — Progress Notes (Signed)
Name: Jimmy Green   MRN: [id number]    DOB: [birth year]   Date:10/22/2015       Progress Note  Subjective  Chief Complaint  Chief Complaint  Patient presents with  . Advice Only    patient has improved and stated that he does not need physical therapy  . Heart Problem    patient no longer has a cardiologist and wants Dr. Ancil Boozer to take up his care. patient has stents and has not had a recent EKG.    HPI   Headache: he has been seen by ENT for dizziness since October 2016, multiple test have been done, he was seen by Neurologist and was advised to have PT for his neck - and also home exercises.  He states headaches have resolved. He  described as dull/throbbing sensation,  radiates to right frontal area and behind right eye. He is sensitive to light, but no nausea or vomiting. He was having episodes of dizziness but that has resolved also. Last episode of dizziness a couple of weeks ago, only lasted a few seconds and resolved by itself. He is no longer taking Nortriptyline. He does not want to go to PT, he states he is feeling fine now.   CAD: he had a stent placed in 2008 [place] and no longer seeing a cardiologist ( Dr. Terrence Dupont in [place]). He states he is feeling well, no chest pain, no SOB with activity no decrease in exercise tolerance. He is taking aspirin, statin therapy, beta-blocker and does not want to follow up with cardiologist at this time.   Patient Active Problem List   Diagnosis Date Noted  . Hearing loss sensory, bilateral 08/26/2015  . Cerebral microvascular disease 08/26/2015  . Metabolic syndrome 76/19/5093  . Vertigo 06/21/2015  . Buzzing in ear 06/21/2015  . Hypertension goal BP (blood pressure) < 140/90 03/28/2015  . GERD (gastroesophageal reflux disease) 03/28/2015  . Hyperlipidemia 03/28/2015  . Depression, major ([place]) 03/28/2015  . CAD in native artery 03/28/2015  . Actinic keratosis 02/14/2015  . Obstructive sleep apnea 02/03/2015  . Ileitis 01/31/2015   . Diverticulosis of colon without hemorrhage 01/31/2015  . Hx of colonic polyps   . Benign neoplasm of descending colon   . Benign neoplasm of sigmoid colon   . Ulceration of intestine   . Idiopathic colitis   . Sebaceous cyst 09/14/2014    Past Surgical History  Procedure Laterality Date  . Hernia repair    . Coronary stent placement    . Left heart catheterization with coronary angiogram N/A 10/08/2011    Procedure: LEFT HEART CATHETERIZATION WITH CORONARY ANGIOGRAM;  Surgeon: Clent Demark, MD;  Location: [facility] CATH LAB;  Service: Cardiovascular;  Laterality: N/A;  . Fractional flow reserve wire  10/08/2011    Procedure: FRACTIONAL FLOW RESERVE WIRE;  Surgeon: Clent Demark, MD;  Location: [facility] CATH LAB;  Service: Cardiovascular;;  . Colonoscopy N/A 01/15/2015    Wohl-ileitis, 2 benign polyps, cryptitis, sigmoid diverticulosis, focal ulceration ICV    Family History  Problem Relation Age of Onset  . Lung cancer Father   . Heart disease Father   . Skin cancer Father     Social History   Social History  . Marital Status: Married    Spouse Name: N/A  . Number of Children: N/A  . Years of Education: N/A   Occupational History  . Not on file.   Social History Main Topics  . Smoking status: Former Smoker -- 40 years  Types: Cigarettes    Quit date: 08/03/2012  . Smokeless tobacco: Never Used  . Alcohol Use: No  . Drug Use: No  . Sexual Activity:    Partners: Female   Other Topics Concern  . Not on file   Social History Narrative     Current outpatient prescriptions:  .  amLODipine (NORVASC) 2.5 MG tablet, Take 1 tablet (2.5 mg total) by mouth daily., Disp: 30 tablet, Rfl: 5 .  aspirin EC 81 MG tablet, Take 81 mg by mouth every morning. , Disp: , Rfl:  .  atorvastatin (LIPITOR) 40 MG tablet, Take 1 tablet (40 mg total) by mouth daily., Disp: 30 tablet, Rfl: 3 .  buPROPion (WELLBUTRIN XL) 150 MG 24 hr tablet, Take 3 tablets (450 mg total) by mouth daily., Disp: 90  tablet, Rfl: 3 .  escitalopram (LEXAPRO) 20 MG tablet, Take 1 tablet (20 mg total) by mouth daily., Disp: 30 tablet, Rfl: 3 .  ibuprofen (ADVIL,MOTRIN) 800 MG tablet, , Disp: , Rfl: 1 .  losartan-hydrochlorothiazide (HYZAAR) 100-12.5 MG tablet, Take 1 tablet by mouth daily., Disp: 30 tablet, Rfl: 3 .  meclizine (ANTIVERT) 25 MG tablet, Take 1 tablet (25 mg total) by mouth 3 (three) times daily as needed for dizziness (May take 1-2 tablets every 8 hours as needed for dizziness.)., Disp: 20 tablet, Rfl: 0 .  metoprolol succinate (TOPROL-XL) 50 MG 24 hr tablet, Take 1 tablet (50 mg total) by mouth every evening., Disp: 30 tablet, Rfl: 3 .  nitroGLYCERIN (NITROSTAT) 0.4 MG SL tablet, Place 1 tablet (0.4 mg total) under the tongue every 5 (five) minutes as needed. For chest pain., Disp: 30 tablet, Rfl: 0 .  Omeprazole 20 MG TBEC, Take 1 tablet (20 mg total) by mouth daily., Disp: 30 tablet, Rfl: 3 .  traMADol (ULTRAM) 50 MG tablet, Take 1 tablet (50 mg total) by mouth every 8 (eight) hours as needed., Disp: 20 tablet, Rfl: 0  No Known Allergies   ROS  Constitutional: Negative for fever or weight change.  Respiratory: Negative for cough and shortness of breath.   Cardiovascular: Negative for chest pain or palpitations.  Gastrointestinal: Negative for abdominal pain, no bowel changes.  Musculoskeletal: Negative for gait problem or joint swelling.  Skin: Negative for rash.  Neurological: Positive  for intermittent for dizziness and headache.  No other specific complaints in a complete review of systems (except as listed in HPI above).  Objective  Filed Vitals:   10/22/15 0838  BP: 138/70  Pulse: 73  Temp: 98.1 F (36.7 C)  TempSrc: Oral  Resp: 16  Height: '5\' 8"'  (1.727 m)  Weight: 221 lb (100.245 kg)  SpO2: 97%    Body mass index is 33.61 kg/(m^2).  Physical Exam  Constitutional: Patient appears well-developed and well-nourished. Obese No distress.  HEENT: head atraumatic,  normocephalic, pupils equal and reactive to light, neck supple, throat within normal limits Cardiovascular: Normal rate, regular rhythm and normal heart sounds. No murmur heard. No BLE edema. Pulmonary/Chest: Effort normal and breath sounds normal. No respiratory distress. Abdominal: Soft. There is no tenderness. Psychiatric: Patient has a normal mood and affect. behavior is normal. Judgment and thought content normal. Neurological: normal cranial nerves, Romberg negative, normal grip and balance during exam. No nystagmus  Recent Results (from the past 2160 hour(s))  I-STAT creatinine     Status: None   Collection Time: 08/23/15  9:46 AM  Result Value Ref Range   Creatinine, Ser 0.70 0.61 - 1.24 mg/dL  Basic metabolic panel     Status: Abnormal   Collection Time: 08/29/15 11:47 AM  Result Value Ref Range   Sodium 135 135 - 145 mmol/L   Potassium 3.6 3.5 - 5.1 mmol/L   Chloride 103 101 - 111 mmol/L   CO2 25 22 - 32 mmol/L   Glucose, Bld 118 (H) 65 - 99 mg/dL   BUN 10 6 - 20 mg/dL   Creatinine, Ser 0.64 0.61 - 1.24 mg/dL   Calcium 9.1 8.9 - 10.3 mg/dL   GFR calc non Af Amer >60 >60 mL/min   GFR calc Af Amer >60 >60 mL/min    Comment: (NOTE) The eGFR has been calculated using the CKD EPI equation. This calculation has not been validated in all clinical situations. eGFR's persistently <60 mL/min signify possible Chronic Kidney Disease.    Anion gap 7 5 - 15  CBC     Status: None   Collection Time: 08/29/15 11:47 AM  Result Value Ref Range   WBC 9.4 3.8 - 10.6 K/uL   RBC 5.09 4.40 - 5.90 MIL/uL   Hemoglobin 15.3 13.0 - 18.0 g/dL   HCT 45.4 40.0 - 52.0 %   MCV 89.1 80.0 - 100.0 fL   MCH 30.1 26.0 - 34.0 pg   MCHC 33.8 32.0 - 36.0 g/dL   RDW 13.8 11.5 - 14.5 %   Platelets 318 150 - 440 K/uL  TSH     Status: None   Collection Time: 08/29/15 11:47 AM  Result Value Ref Range   TSH 1.018 0.350 - 4.500 uIU/mL  Sedimentation rate     Status: None   Collection Time: 08/29/15  11:47 AM  Result Value Ref Range   Sed Rate 8 0 - 20 mm/hr     PHQ2/9: Depression screen [facility] 2/9 10/22/2015 09/03/2015 08/26/2015 07/08/2015 06/21/2015  Decreased Interest 0 0 0 0 0  Down, Depressed, Hopeless 0 0 0 0 0  PHQ - 2 Score 0 0 0 0 0     Fall Risk: Fall Risk  10/22/2015 09/03/2015 08/26/2015 07/08/2015 06/21/2015  Falls in the past year? No Yes Yes Yes Yes  Number falls in past yr: - 2 or more 2 or more 2 or more 2 or more  Injury with Fall? - Yes Yes Yes No  Risk for fall due to : - History of fall(s) History of fall(s);Impaired balance/gait Other (Comment) Impaired balance/gait  Follow up - - - Falls prevention discussed;Education provided;Falls evaluation completed -     Functional Status Survey: Is the patient deaf or have difficulty hearing?: Yes Does the patient have difficulty seeing, even when wearing glasses/contacts?: No Does the patient have difficulty concentrating, remembering, or making decisions?: No Does the patient have difficulty walking or climbing stairs?: No Does the patient have difficulty dressing or bathing?: No Does the patient have difficulty doing errands alone such as visiting a doctor's office or shopping?: No   Assessment & Plan  1. Vertigo  Doing well, refusing PT, states he is feeling well  2. CAD in native artery  He does not want to have an EKG today, he would like to have it done next visit

## 2015-11-26 ENCOUNTER — Ambulatory Visit (INDEPENDENT_AMBULATORY_CARE_PROVIDER_SITE_OTHER): Payer: Medicaid Other | Admitting: Family Medicine

## 2015-11-26 ENCOUNTER — Encounter: Payer: Self-pay | Admitting: Family Medicine

## 2015-11-26 VITALS — BP 130/78 | HR 67 | Temp 98.1°F | Resp 18 | Ht 68.0 in | Wt 225.5 lb

## 2015-11-26 DIAGNOSIS — E785 Hyperlipidemia, unspecified: Secondary | ICD-10-CM | POA: Diagnosis not present

## 2015-11-26 DIAGNOSIS — G4733 Obstructive sleep apnea (adult) (pediatric): Secondary | ICD-10-CM

## 2015-11-26 DIAGNOSIS — F330 Major depressive disorder, recurrent, mild: Secondary | ICD-10-CM

## 2015-11-26 DIAGNOSIS — M65312 Trigger thumb, left thumb: Secondary | ICD-10-CM | POA: Diagnosis not present

## 2015-11-26 DIAGNOSIS — I10 Essential (primary) hypertension: Secondary | ICD-10-CM | POA: Diagnosis not present

## 2015-11-26 DIAGNOSIS — L729 Follicular cyst of the skin and subcutaneous tissue, unspecified: Secondary | ICD-10-CM | POA: Diagnosis not present

## 2015-11-26 DIAGNOSIS — G44221 Chronic tension-type headache, intractable: Secondary | ICD-10-CM

## 2015-11-26 DIAGNOSIS — I2510 Atherosclerotic heart disease of native coronary artery without angina pectoris: Secondary | ICD-10-CM

## 2015-11-26 DIAGNOSIS — D229 Melanocytic nevi, unspecified: Secondary | ICD-10-CM

## 2015-11-26 MED ORDER — DIAZEPAM 5 MG PO TABS: 5.0000 mg | ORAL_TABLET | Freq: Every day | ORAL | Status: DC | PRN

## 2015-11-26 MED ORDER — NORTRIPTYLINE HCL 10 MG PO CAPS: 10.0000 mg | ORAL_CAPSULE | Freq: Every day | ORAL | Status: DC

## 2015-11-26 MED ORDER — BUTALBITAL-APAP-CAFF-COD 50-300-40-30 MG PO CAPS: 1.0000 | ORAL_CAPSULE | Freq: Four times a day (QID) | ORAL | Status: DC

## 2015-11-26 MED ORDER — ESCITALOPRAM OXALATE 20 MG PO TABS: 20.0000 mg | ORAL_TABLET | Freq: Every day | ORAL | Status: DC

## 2015-11-26 MED ORDER — METOPROLOL SUCCINATE ER 50 MG PO TB24: 50.0000 mg | ORAL_TABLET | Freq: Every evening | ORAL | Status: DC

## 2015-11-26 MED ORDER — LOSARTAN POTASSIUM-HCTZ 100-12.5 MG PO TABS: 1.0000 | ORAL_TABLET | Freq: Every day | ORAL | Status: DC

## 2015-11-26 MED ORDER — ATORVASTATIN CALCIUM 40 MG PO TABS
40.0000 mg | ORAL_TABLET | Freq: Every day | ORAL | Status: DC
Start: 2015-11-26 — End: 2016-02-27

## 2015-11-26 MED ORDER — BUPROPION HCL ER (XL) 150 MG PO TB24: 450.0000 mg | ORAL_TABLET | Freq: Every day | ORAL | Status: DC

## 2015-11-26 MED ORDER — DIAZEPAM 5 MG PO TABS: 5.0000 mg | ORAL_TABLET | Freq: Two times a day (BID) | ORAL | Status: DC | PRN

## 2015-11-26 NOTE — Progress Notes (Signed)
Name: Jimmy Green   MRN: [id number]    DOB: [birth year]   Date:11/26/2015       Progress Note  Subjective  Chief Complaint  Chief Complaint  Patient presents with  . Hypertension    patient is here for her 49-monthf/u. goal BP <140/90  . Depression    major, recurrent, mild  . Hyperlipidemia  . Dizziness    unknown cause  . Sleep Apnea    obstructive  . Coronary Artery Disease    native artery  . Headache  . Hearing Loss    sensory, bilateral  . Hand Pain    Onset-2 weeks, right thumb has been bothering patient with any movement feels like it is clicking. Patient is constant and feels like a burning and squeezing sensation. Patient states he feels like nothing is in there.  . Gastroesophageal Reflux    Had 3 recent flairups when laying down.    HPI  Vertigo: symptoms started in Nov 2016. He had [facility] He states he has been having vertigo daily, triggered by head movement. Sometimes it is hard for him to walk in a straight line. He states no longer happens when he rolls in his bed, he used to have some dizziness when he was standing up, but resolved now. He has also noticed some hearing loss - that has resolved, and also has noticed some tinnitus on the right ear that now is only very seldom ( he was evaluated by ENT and advised to have evaluation for hearing aid and MRI with and without contrast was negative ). He has also noticed intermittent right side headache, usually behind right eye, described as throbbing and aching, 7/10 usually in intensity, and lasts about two  hour. Resolves with Advil. He states frequency has decreased, one episodes every few days now. He states he also feels better when he close his right eye. No tearing from that side. Taking Nortriptyline but only prn   HTN: bp was well controlled No chest pain or palpitation. No side effects of medication   Hyperlipidemia: taking Atorvastatin and denies side  effects.   OSA: not wearing her CPAP machine, waking up between 2: 30 and 3 am every night and takes a little while to fall back asleep. He tried to change his mask, but he was told he needs a new sleep study . He lost his CPAP because of poor compliance but he states he was not compliant because mask was leaking. Explained that headache could be secondary to not using CPAP  History of basal cell carcinoma: seen by Dr. BFleet Contras he has a cyst on the site of biopsy, due for removal, but would like to go to OR, afraid of pain. Discussed trying Valium before procedure, to decrease his stress  Trigger thumb: he noticed that over the past couple of weeks his left thumb gets stuck and is tender, wearing a brace to decrease discomfort.   CAD: taking aspirin, stating and beta blocker, no chest pain or decrease in exercise tolerance  Depression Major: taking Wellbutrin and Lexapro daily, no anhedonia, lack of appetite, feeling well, in remission.   Patient Active Problem List   Diagnosis Date Noted  . Chronic tension-type headache, intractable 09/25/2015  . Benign paroxysmal positional nystagmus 09/25/2015  . Hearing loss sensory, bilateral 08/26/2015  . Cerebral microvascular disease 08/26/2015  . Metabolic syndrome 169/48/5462 . Vertigo 06/21/2015  . Buzzing in ear 06/21/2015  .  Hypertension goal BP (blood pressure) < 140/90 03/28/2015  . GERD (gastroesophageal reflux disease) 03/28/2015  . Hyperlipidemia 03/28/2015  . Depression, major ([place]) 03/28/2015  . CAD in native artery 03/28/2015  . Actinic keratosis 02/14/2015  . Obstructive sleep apnea 02/03/2015  . Ileitis 01/31/2015  . Diverticulosis of colon without hemorrhage 01/31/2015  . Hx of colonic polyps   . Benign neoplasm of descending colon   . Benign neoplasm of sigmoid colon   . Ulceration of intestine   . Idiopathic colitis   . Sebaceous cyst 09/14/2014    Past Surgical History  Procedure Laterality Date  . Hernia repair     . Coronary stent placement    . Left heart catheterization with coronary angiogram N/A 10/08/2011    Procedure: LEFT HEART CATHETERIZATION WITH CORONARY ANGIOGRAM;  Surgeon: Clent Demark, MD;  Location: [facility] CATH LAB;  Service: Cardiovascular;  Laterality: N/A;  . Fractional flow reserve wire  10/08/2011    Procedure: FRACTIONAL FLOW RESERVE WIRE;  Surgeon: Clent Demark, MD;  Location: [place] CATH LAB;  Service: Cardiovascular;;  . Colonoscopy N/A 01/15/2015    Wohl-ileitis, 2 benign polyps, cryptitis, sigmoid diverticulosis, focal ulceration ICV    Family History  Problem Relation Age of Onset  . Lung cancer Father   . Heart disease Father   . Skin cancer Father     Social History   Social History  . Marital Status: Married    Spouse Name: N/A  . Number of Children: N/A  . Years of Education: N/A   Occupational History  . Not on file.   Social History Main Topics  . Smoking status: Former Smoker -- 40 years    Types: Cigarettes    Quit date: 08/03/2012  . Smokeless tobacco: Never Used  . Alcohol Use: No  . Drug Use: No  . Sexual Activity:    Partners: Female   Other Topics Concern  . Not on file   Social History Narrative     Current outpatient prescriptions:  .  amLODipine (NORVASC) 2.5 MG tablet, Take 1 tablet (2.5 mg total) by mouth daily., Disp: 30 tablet, Rfl: 5 .  aspirin EC 81 MG tablet, Take 81 mg by mouth every morning. , Disp: , Rfl:  .  atorvastatin (LIPITOR) 40 MG tablet, Take 1 tablet (40 mg total) by mouth daily., Disp: 30 tablet, Rfl: 3 .  buPROPion (WELLBUTRIN XL) 150 MG 24 hr tablet, Take 3 tablets (450 mg total) by mouth daily., Disp: 90 tablet, Rfl: 3 .  escitalopram (LEXAPRO) 20 MG tablet, Take 1 tablet (20 mg total) by mouth daily., Disp: 30 tablet, Rfl: 3 .  losartan-hydrochlorothiazide (HYZAAR) 100-12.5 MG tablet, Take 1 tablet by mouth daily., Disp: 30 tablet, Rfl: 3 .  meclizine (ANTIVERT) 25 MG tablet, Take 1 tablet (25 mg total) by mouth 3  (three) times daily as needed for dizziness (May take 1-2 tablets every 8 hours as needed for dizziness.)., Disp: 20 tablet, Rfl: 0 .  metoprolol succinate (TOPROL-XL) 50 MG 24 hr tablet, Take 1 tablet (50 mg total) by mouth every evening., Disp: 30 tablet, Rfl: 3 .  nitroGLYCERIN (NITROSTAT) 0.4 MG SL tablet, Place 1 tablet (0.4 mg total) under the tongue every 5 (five) minutes as needed. For chest pain., Disp: 30 tablet, Rfl: 0 .  nortriptyline (PAMELOR) 10 MG capsule, Take 1 capsule (10 mg total) by mouth at bedtime., Disp: 30 capsule, Rfl: 3 .  Omeprazole 20 MG TBEC, Take 1 tablet (20 mg  total) by mouth daily., Disp: 30 tablet, Rfl: 3 .  Butalbital-APAP-Caff-Cod 50-300-40-30 MG CAPS, Take 1 capsule by mouth every 6 (six) hours., Disp: 30 capsule, Rfl: 0 .  diazepam (VALIUM) 5 MG tablet, Take 1 tablet (5 mg total) by mouth every 12 (twelve) hours as needed for anxiety., Disp: 30 tablet, Rfl: 1  No Known Allergies   ROS  Constitutional: Negative for fever or weight change.  Respiratory: Negative for cough and shortness of breath.   Cardiovascular: Negative for chest pain or palpitations.  Gastrointestinal: Negative for abdominal pain, no bowel changes.  Musculoskeletal: Negative for gait problem or joint swelling.  Skin: Negative for rash.  Neurological: Positive  for dizziness or headache.  No other specific complaints in a complete review of systems (except as listed in HPI above).  Objective  Filed Vitals:   11/26/15 0941  BP: 130/78  Pulse: 67  Temp: 98.1 F (36.7 C)  TempSrc: Oral  Resp: 18  Height: 5' 8" (1.727 m)  Weight: 225 lb 8 oz (102.286 kg)  SpO2: 95%    Body mass index is 34.3 kg/(m^2).  Physical Exam  Constitutional: Patient appears well-developed and well-nourished. Obese  No distress.  HEENT: head atraumatic, normocephalic, pupils equal and reactive to light, neck supple, throat within normal limits Cardiovascular: Normal rate, regular rhythm and normal  heart sounds.  No murmur heard. No BLE edema. Pulmonary/Chest: Effort normal and breath sounds normal. No respiratory distress. Abdominal: Soft.  There is no tenderness. Psychiatric: Patient has a normal mood and affect. behavior is normal. Judgment and thought content normal. Muscular Skeletal: trigger thumb left  Skin: cyst on the left shoulder, also one on nose, moles  Recent Results (from the past 2160 hour(s))  Basic metabolic panel     Status: Abnormal   Collection Time: 08/29/15 11:47 AM  Result Value Ref Range   Sodium 135 135 - 145 mmol/L   Potassium 3.6 3.5 - 5.1 mmol/L   Chloride 103 101 - 111 mmol/L   CO2 25 22 - 32 mmol/L   Glucose, Bld 118 (H) 65 - 99 mg/dL   BUN 10 6 - 20 mg/dL   Creatinine, Ser 0.64 0.61 - 1.24 mg/dL   Calcium 9.1 8.9 - 10.3 mg/dL   GFR calc non Af Amer >60 >60 mL/min   GFR calc Af Amer >60 >60 mL/min    Comment: (NOTE) The eGFR has been calculated using the CKD EPI equation. This calculation has not been validated in all clinical situations. eGFR's persistently <60 mL/min signify possible Chronic Kidney Disease.    Anion gap 7 5 - 15  CBC     Status: None   Collection Time: 08/29/15 11:47 AM  Result Value Ref Range   WBC 9.4 3.8 - 10.6 K/uL   RBC 5.09 4.40 - 5.90 MIL/uL   Hemoglobin 15.3 13.0 - 18.0 g/dL   HCT 45.4 40.0 - 52.0 %   MCV 89.1 80.0 - 100.0 fL   MCH 30.1 26.0 - 34.0 pg   MCHC 33.8 32.0 - 36.0 g/dL   RDW 13.8 11.5 - 14.5 %   Platelets 318 150 - 440 K/uL  TSH     Status: None   Collection Time: 08/29/15 11:47 AM  Result Value Ref Range   TSH 1.018 0.350 - 4.500 uIU/mL  Sedimentation rate     Status: None   Collection Time: 08/29/15 11:47 AM  Result Value Ref Range   Sed Rate 8 0 - 20 mm/hr  PHQ2/9: Depression screen [facility] 2/9 11/26/2015 10/22/2015 09/03/2015 08/26/2015 07/08/2015  Decreased Interest 0 0 0 0 0  Down, Depressed, Hopeless 0 0 0 0 0  PHQ - 2 Score 0 0 0 0 0     Fall Risk: Fall Risk  11/26/2015 10/22/2015  09/03/2015 08/26/2015 07/08/2015  Falls in the past year? No No Yes Yes Yes  Number falls in past yr: - - 2 or more 2 or more 2 or more  Injury with Fall? - - Yes Yes Yes  Risk for fall due to : - - History of fall(s) History of fall(s);Impaired balance/gait Other (Comment)  Follow up - - - - Falls prevention discussed;Education provided;Falls evaluation completed      Functional Status Survey: Is the patient deaf or have difficulty hearing?: Yes Does the patient have difficulty seeing, even when wearing glasses/contacts?: No Does the patient have difficulty concentrating, remembering, or making decisions?: No Does the patient have difficulty walking or climbing stairs?: No Does the patient have difficulty dressing or bathing?: No Does the patient have difficulty doing errands alone such as visiting a doctor's office or shopping?: No   Assessment & Plan  1. Hypertension goal BP (blood pressure) < 140/90  Continue medications, at goal   2. Hyperlipidemia  - atorvastatin (LIPITOR) 40 MG tablet; Take 1 tablet (40 mg total) by mouth daily.  Dispense: 30 tablet; Refill: 3  3. Obstructive sleep apnea  Does not use CPAP discussed risk of not wearing it  4. Numerous moles  - Ambulatory referral to Dermatology  5. CAD in native artery  - metoprolol succinate (TOPROL-XL) 50 MG 24 hr tablet; Take 1 tablet (50 mg total) by mouth every evening.  Dispense: 30 tablet; Refill: 3  6. Depression, major, recurrent, mild ([facility])  In remission  - escitalopram (LEXAPRO) 20 MG tablet; Take 1 tablet (20 mg total) by mouth daily.  Dispense: 30 tablet; Refill: 3 - buPROPion (WELLBUTRIN XL) 150 MG 24 hr tablet; Take 3 tablets (450 mg total) by mouth daily.  Dispense: 90 tablet; Refill: 3  7. Cyst of skin  Follow up with Dr. Fleet Contras - diazepam (VALIUM) 5 MG tablet; Take 1 tablet (5 mg total) by mouth every 12 (twelve) hours as needed for anxiety.  Dispense: 30 tablet; Refill: 1  8. Hypertension,  benign  - losartan-hydrochlorothiazide (HYZAAR) 100-12.5 MG tablet; Take 1 tablet by mouth daily.  Dispense: 30 tablet; Refill: 3 - metoprolol succinate (TOPROL-XL) 50 MG 24 hr tablet; Take 1 tablet (50 mg total) by mouth every evening.  Dispense: 30 tablet; Refill: 3  9. Chronic tension-type headache, intractable  - nortriptyline (PAMELOR) 10 MG capsule; Take 1 capsule (10 mg total) by mouth at bedtime.  Dispense: 30 capsule; Refill: 3 - Butalbital-APAP-Caff-Cod 50-300-40-30 MG CAPS; Take 1 capsule by mouth every 6 (six) hours.  Dispense: 30 capsule; Refill: 0  10. Trigger thumb of left hand  - Ambulatory referral to Orthopedic Surgery

## 2015-12-02 HISTORY — PX: BASAL CELL CARCINOMA EXCISION: SHX1214

## 2015-12-17 ENCOUNTER — Ambulatory Visit (INDEPENDENT_AMBULATORY_CARE_PROVIDER_SITE_OTHER): Payer: Medicaid Other | Admitting: General Surgery

## 2015-12-17 ENCOUNTER — Encounter: Payer: Self-pay | Admitting: General Surgery

## 2015-12-17 VITALS — BP 118/82 | HR 68 | Resp 14 | Ht 69.0 in | Wt 221.0 lb

## 2015-12-17 DIAGNOSIS — L723 Sebaceous cyst: Secondary | ICD-10-CM

## 2015-12-17 MED ORDER — HYDROCODONE-ACETAMINOPHEN 5-325 MG PO TABS: 1.0000 | ORAL_TABLET | ORAL | Status: DC | PRN

## 2015-12-17 NOTE — Progress Notes (Signed)
Patient ID: Jimmy Green, male   DOB: July 29, 1951, 65 y.o.   MRN: [id number]  Chief Complaint  Patient presents with  . Procedure    left shoulder excion    HPI [name] is a 65 y.o. male here today for a left shoulder excision. He did take his valium prior to office visit, consent previously signed.    HPI  Past Medical History  Diagnosis Date  . Hyperlipidemia   . Hypertension   . Coronary artery disease   . Skin cancer 2014    nose and right knee  . Depression   . GERD (gastroesophageal reflux disease)   . Hypogonadism in male   . Metabolic syndrome     Past Surgical History  Procedure Laterality Date  . Hernia repair    . Coronary stent placement    . Left heart catheterization with coronary angiogram N/A 10/08/2011    Procedure: LEFT HEART CATHETERIZATION WITH CORONARY ANGIOGRAM;  Surgeon: Clent Demark, MD;  Location: [facility] CATH LAB;  Service: Cardiovascular;  Laterality: N/A;  . Fractional flow reserve wire  10/08/2011    Procedure: FRACTIONAL FLOW RESERVE WIRE;  Surgeon: Clent Demark, MD;  Location: [place] CATH LAB;  Service: Cardiovascular;;  . Colonoscopy N/A 01/15/2015    Wohl-ileitis, 2 benign polyps, cryptitis, sigmoid diverticulosis, focal ulceration ICV    Family History  Problem Relation Age of Onset  . Lung cancer Father   . Heart disease Father   . Skin cancer Father     Social History Social History  Substance Use Topics  . Smoking status: Former Smoker -- 40 years    Types: Cigarettes    Quit date: 08/03/2012  . Smokeless tobacco: Never Used  . Alcohol Use: No    No Known Allergies  Current Outpatient Prescriptions  Medication Sig Dispense Refill  . amLODipine (NORVASC) 2.5 MG tablet Take 1 tablet (2.5 mg total) by mouth daily. 30 tablet 5  . aspirin EC 81 MG tablet Take 81 mg by mouth every morning.     [name] atorvastatin (LIPITOR) 40 MG tablet Take 1 tablet (40 mg total) by mouth daily. 30 tablet 3  . buPROPion (WELLBUTRIN XL) 150 MG  24 hr tablet Take 3 tablets (450 mg total) by mouth daily. 90 tablet 3  . Butalbital-APAP-Caff-Cod 50-300-40-30 MG CAPS Take 1 capsule by mouth every 6 (six) hours. 30 capsule 0  . diazepam (VALIUM) 5 MG tablet Take 1 tablet (5 mg total) by mouth daily as needed for anxiety. 30 minutes before procedure may repeat time one 5 tablet 0  . escitalopram (LEXAPRO) 20 MG tablet Take 1 tablet (20 mg total) by mouth daily. 30 tablet 3  . losartan-hydrochlorothiazide (HYZAAR) 100-12.5 MG tablet Take 1 tablet by mouth daily. 30 tablet 3  . meclizine (ANTIVERT) 25 MG tablet Take 1 tablet (25 mg total) by mouth 3 (three) times daily as needed for dizziness (May take 1-2 tablets every 8 hours as needed for dizziness.). 20 tablet 0  . metoprolol succinate (TOPROL-XL) 50 MG 24 hr tablet Take 1 tablet (50 mg total) by mouth every evening. 30 tablet 3  . nitroGLYCERIN (NITROSTAT) 0.4 MG SL tablet Place 1 tablet (0.4 mg total) under the tongue every 5 (five) minutes as needed. For chest pain. 30 tablet 0  . nortriptyline (PAMELOR) 10 MG capsule Take 1 capsule (10 mg total) by mouth at bedtime. 30 capsule 3  . Omeprazole 20 MG TBEC Take 1 tablet (20 mg total) by  mouth daily. 30 tablet 3  . HYDROcodone-acetaminophen (NORCO) 5-325 MG tablet Take 1-2 tablets by mouth every 4 (four) hours as needed for moderate pain. 20 tablet 0   No current facility-administered medications for this visit.    Review of Systems Review of Systems  Constitutional: Negative.   Respiratory: Negative.   Cardiovascular: Negative.     Blood pressure 118/82, pulse 68, resp. rate 14, height 5\' 9"  (1.753 m), weight 221 lb (100.245 kg).  Physical Exam Physical Exam  Pulmonary/Chest:         Assessment    Suspected sebaceous cyst left back.    Plan    The area was cleansed with ChloraPrep and 10 mL of 0.5% Xylocaine with 0.25% Marcaine with 1-200,000 of epinephrine was utilized well tolerated. The area was excised through an  obliquely oriented elliptical incision. The deep tissue was approximated with interrupted 3-0 Vicryl sutures. The skin was closed with a 4-0 nylon running suture. Telfa and Tegaderm applied. Ice pack provided. Postbiopsy instructions reviewed. Prescription for Norco provided for post procedure pain. He will return in 9 days for suture removal with the nurse.    PCP:  Marcia Brash This information has been scribed by Karie Fetch RN, BSN,BC.    Robert Bellow 12/18/2015, 8:48 PM

## 2015-12-17 NOTE — Patient Instructions (Signed)
The patient is aware to call back for any questions or concerns. may leave dressing in place if it comes off may apply band aid as needed. Return for suture removal

## 2015-12-20 ENCOUNTER — Telehealth: Payer: Self-pay

## 2015-12-20 NOTE — Telephone Encounter (Signed)
-----   Message from Robert Bellow, MD sent at 12/19/2015  9:12 PM EDT ----- Please notify the patient that the tissue removed was a keloid scar (abnormal thickening, no cancer). He has a f/u appt for next week for suture removal. I need to see him in one month for wound evaluation. Thanks.  ----- Message -----    From: Lab [facility]    Sent: 12/19/2015   7:18 PM      To: Robert Bellow, MD

## 2015-12-20 NOTE — Telephone Encounter (Signed)
Notified patient as instructed, patient pleased. Discussed follow-up appointments, patient agrees  

## 2015-12-23 ENCOUNTER — Other Ambulatory Visit: Payer: Self-pay | Admitting: Family Medicine

## 2015-12-23 NOTE — Telephone Encounter (Signed)
Patient requesting refill. 

## 2015-12-26 ENCOUNTER — Ambulatory Visit (INDEPENDENT_AMBULATORY_CARE_PROVIDER_SITE_OTHER): Payer: Medicaid Other | Admitting: *Deleted

## 2015-12-26 DIAGNOSIS — L723 Sebaceous cyst: Secondary | ICD-10-CM

## 2015-12-26 NOTE — Progress Notes (Signed)
Patient came in today for a wound check.  The wound is clean, with no signs of infection noted. The sutures were removed and steri strips applied.  

## 2016-01-14 ENCOUNTER — Encounter: Payer: Self-pay | Admitting: *Deleted

## 2016-01-21 ENCOUNTER — Ambulatory Visit: Payer: Medicaid Other | Admitting: General Surgery

## 2016-02-20 ENCOUNTER — Other Ambulatory Visit: Payer: Self-pay | Admitting: Family Medicine

## 2016-02-21 NOTE — Telephone Encounter (Signed)
Patient requesting refill. 

## 2016-02-27 ENCOUNTER — Ambulatory Visit (INDEPENDENT_AMBULATORY_CARE_PROVIDER_SITE_OTHER): Payer: Medicaid Other | Admitting: Family Medicine

## 2016-02-27 ENCOUNTER — Encounter: Payer: Self-pay | Admitting: Family Medicine

## 2016-02-27 VITALS — BP 142/86 | HR 72 | Temp 98.0°F | Resp 16 | Ht 69.0 in | Wt 226.1 lb

## 2016-02-27 DIAGNOSIS — I2510 Atherosclerotic heart disease of native coronary artery without angina pectoris: Secondary | ICD-10-CM

## 2016-02-27 DIAGNOSIS — K219 Gastro-esophageal reflux disease without esophagitis: Secondary | ICD-10-CM | POA: Diagnosis not present

## 2016-02-27 DIAGNOSIS — E785 Hyperlipidemia, unspecified: Secondary | ICD-10-CM

## 2016-02-27 DIAGNOSIS — F330 Major depressive disorder, recurrent, mild: Secondary | ICD-10-CM

## 2016-02-27 DIAGNOSIS — G44221 Chronic tension-type headache, intractable: Secondary | ICD-10-CM | POA: Diagnosis not present

## 2016-02-27 DIAGNOSIS — R202 Paresthesia of skin: Secondary | ICD-10-CM

## 2016-02-27 DIAGNOSIS — I200 Unstable angina: Secondary | ICD-10-CM

## 2016-02-27 DIAGNOSIS — R42 Dizziness and giddiness: Secondary | ICD-10-CM

## 2016-02-27 DIAGNOSIS — G4733 Obstructive sleep apnea (adult) (pediatric): Secondary | ICD-10-CM | POA: Diagnosis not present

## 2016-02-27 DIAGNOSIS — I10 Essential (primary) hypertension: Secondary | ICD-10-CM

## 2016-02-27 DIAGNOSIS — R29898 Other symptoms and signs involving the musculoskeletal system: Secondary | ICD-10-CM | POA: Diagnosis not present

## 2016-02-27 MED ORDER — LOSARTAN POTASSIUM-HCTZ 100-25 MG PO TABS: 1.0000 | ORAL_TABLET | Freq: Every day | ORAL | 3 refills | Status: DC

## 2016-02-27 MED ORDER — BUTALBITAL-APAP-CAFF-COD 50-300-40-30 MG PO CAPS: 1.0000 | ORAL_CAPSULE | Freq: Four times a day (QID) | ORAL | 0 refills | Status: DC

## 2016-02-27 MED ORDER — BUPROPION HCL ER (XL) 150 MG PO TB24: 450.0000 mg | ORAL_TABLET | Freq: Every day | ORAL | 3 refills | Status: DC

## 2016-02-27 MED ORDER — NORTRIPTYLINE HCL 10 MG PO CAPS: 10.0000 mg | ORAL_CAPSULE | Freq: Every day | ORAL | 3 refills | Status: DC

## 2016-02-27 MED ORDER — OMEPRAZOLE 20 MG PO TBEC: DELAYED_RELEASE_TABLET | ORAL | 3 refills | Status: DC

## 2016-02-27 MED ORDER — AMLODIPINE BESYLATE 2.5 MG PO TABS: 2.5000 mg | ORAL_TABLET | Freq: Every day | ORAL | 3 refills | Status: DC

## 2016-02-27 MED ORDER — METOPROLOL SUCCINATE ER 50 MG PO TB24: 50.0000 mg | ORAL_TABLET | Freq: Every evening | ORAL | 3 refills | Status: DC

## 2016-02-27 MED ORDER — ESCITALOPRAM OXALATE 20 MG PO TABS: 20.0000 mg | ORAL_TABLET | Freq: Every day | ORAL | 3 refills | Status: DC

## 2016-02-27 MED ORDER — NITROGLYCERIN 0.4 MG SL SUBL: 0.4000 mg | SUBLINGUAL_TABLET | SUBLINGUAL | 0 refills | Status: DC | PRN

## 2016-02-27 MED ORDER — ATORVASTATIN CALCIUM 40 MG PO TABS: 40.0000 mg | ORAL_TABLET | Freq: Every day | ORAL | 3 refills | Status: DC

## 2016-02-27 NOTE — Progress Notes (Signed)
Name: Jimmy Green   MRN: [id number]    DOB: 09-14-1950   Date:02/27/2016       Progress Note  Subjective  Chief Complaint  Chief Complaint  Patient presents with  . Medication Refill    3 month F/U, would like to discuss going to a heart doctor  . Hypertension    Patient states his BP has been elevated recently at home running 186/[facility] a week ago. States he has gained 5 pounds but has not changed his diet and takes his medication daily.  . Gastroesophageal Reflux    Controlled with medication  . Hyperlipidemia    Restless legs and numb on top of right leg every day.  . Depression    Still feel sluggish  . Coronary Artery Disease    HPI  Vertigo: symptoms started in Nov 2016. He had one episode of syncope 07/05/2015 went to [facility], head CT negative He states he has been having vertigo daily, triggered by head movement. Sometimes it was hard for him to walk in a straight line. He states no longer happens when he rolls in his bed, and episodes are very seldom now. He has also noticed some hearing loss - that has resolved, and also has noticed some tinnitus on the right ear that now also resolved  ( he was evaluated by ENT and advised to have evaluation for hearing aid and MRI with and without contrast was negative ). He has also noticed intermittent right side headache, usually behind right eye, described as throbbing and aching, 7/10 usually in intensity, and lasts about two  hour. Resolves with Advil. He states frequency has decreased, one episodes every few days now. He states he also feels better when he close his right eye. No tearing from that side. Taking Nortriptyline but only prn and headache resolves and occasionally Fioricet  HTN: bp is elevated today, she has been taking medication daily. BP at local drug store can go up to 180's No chest pain or palpitation. No side effects of medication   Hyperlipidemia: taking Atorvastatin and denies side effects. No myalgia  OSA:  not wearing her CPAP machine, waking up between 2: 30 and 3 am every night and takes a little while to fall back asleep. He tried to change his mask, but he was told he needs a new sleep study . He lost his CPAP because of poor compliance but he states he was not compliant because mask was leaking. Explained that headache could be secondary to not using CPAP  History of basal cell carcinoma: seen by Dr. Fleet Contras, he has a cyst on the site of biopsy and it was removed. Seeing Dermatologist at [facility]  CAD: taking aspirin, statin and beta blocker, no chest pain or decrease in exercise tolerance. 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   Leg weakness: he states that over the past couple of months he has noticed some weakness on right leg when he tries to get up or go up stairs or ladders. Right is worse than left and has some intermittent numbness on right upper thigh. No joint pains. No redness. He has intermittent back pain when bending forward. He stopped hunting because of the symptoms. No weakness on upper extremities.   Ulceration of intestine during colonoscopy: we will contact Dr. Durwin Reges to find out what kind of follow up he needs  Patient Active Problem List   Diagnosis Date Noted  . Chronic tension-type headache, intractable 09/25/2015  . Benign paroxysmal positional nystagmus 09/25/2015  . Hearing loss sensory, bilateral 08/26/2015  . Cerebral microvascular disease 08/26/2015  . Metabolic syndrome [id number]  . Vertigo 06/21/2015  . Buzzing in ear 06/21/2015  . Hypertension goal BP (blood pressure) < 140/90 03/28/2015  . GERD (gastroesophageal reflux disease) 03/28/2015  . Hyperlipidemia 03/28/2015  . Depression, major ([place]) 03/28/2015  . CAD in native artery 03/28/2015  . Actinic keratosis 02/14/2015  . Obstructive sleep apnea 02/03/2015  .  Ileitis 01/31/2015  . Diverticulosis of colon without hemorrhage 01/31/2015  . Hx of colonic polyps   . Benign neoplasm of descending colon   . Benign neoplasm of sigmoid colon   . Ulceration of intestine   . Idiopathic colitis   . Sebaceous cyst 09/14/2014    Past Surgical History:  Procedure Laterality Date  . BASAL CELL CARCINOMA EXCISION Left 12/02/2015   Chest-Done by Dermatologist   . COLONOSCOPY N/A 01/15/2015   Wohl-ileitis, 2 benign polyps, cryptitis, sigmoid diverticulosis, focal ulceration ICV  . CORONARY STENT PLACEMENT    . FRACTIONAL FLOW RESERVE WIRE  10/08/2011   Procedure: FRACTIONAL FLOW RESERVE WIRE;  Surgeon: Clent Demark, MD;  Location: [facility] CATH LAB;  Service: Cardiovascular;;  . HERNIA REPAIR    . LEFT HEART CATHETERIZATION WITH CORONARY ANGIOGRAM N/A 10/08/2011   Procedure: LEFT HEART CATHETERIZATION WITH CORONARY ANGIOGRAM;  Surgeon: Clent Demark, MD;  Location: [facility] CATH LAB;  Service: Cardiovascular;  Laterality: N/A;    Family History  Problem Relation Age of Onset  . Lung cancer Father   . Heart disease Father   . Skin cancer Father     Social History   Social History  . Marital status: Married    Spouse name: N/A  . Number of children: N/A  . Years of education: N/A   Occupational History  . Not on file.   Social History Main Topics  . Smoking status: Former Smoker    Years: 40.00    Types: Cigarettes    Quit date: 08/03/2012  . Smokeless tobacco: Never Used  . Alcohol use No  . Drug use: No  . Sexual activity: Yes    Partners: Female   Other Topics Concern  . Not on file   Social History Narrative  . No narrative on file     Current Outpatient Prescriptions:  .  amLODipine (NORVASC) 2.5 MG tablet, Take 1 tablet (2.5 mg total) by mouth daily., Disp: 30 tablet, Rfl: 3 .  aspirin EC 81 MG tablet, Take 81 mg by mouth every morning. , Disp: , Rfl:  .  atorvastatin (LIPITOR) 40 MG tablet, Take 1 tablet (40 mg total) by mouth daily.,  Disp: 30 tablet, Rfl: 3 .  buPROPion (WELLBUTRIN XL) 150 MG 24 hr tablet, Take 3 tablets (450 mg total) by mouth daily., Disp: 90 tablet, Rfl: 3 .  Butalbital-APAP-Caff-Cod 50-300-40-30 MG CAPS, Take 1 capsule by mouth every 6 (six) hours., Disp: 30 capsule, Rfl: 0 .  escitalopram (LEXAPRO) 20 MG tablet, Take 1 tablet (20 mg total) by mouth daily., Disp: 30 tablet, Rfl: 3 .  losartan-hydrochlorothiazide (HYZAAR) 100-25 MG tablet, Take 1 tablet by mouth daily., Disp: 30 tablet, Rfl: 3 .  meclizine (ANTIVERT) 25 MG tablet, Take 1 tablet (25 mg total) by mouth 3 (three) times daily as needed for dizziness (May take 1-2 tablets every 8 hours as needed for dizziness.)., Disp:  20 tablet, Rfl: 0 .  metoprolol succinate (TOPROL-XL) 50 MG 24 hr tablet, Take 1 tablet (50 mg total) by mouth every evening., Disp: 30 tablet, Rfl: 3 .  nitroGLYCERIN (NITROSTAT) 0.4 MG SL tablet, Place 1 tablet (0.4 mg total) under the tongue every 5 (five) minutes as needed. For chest pain., Disp: 30 tablet, Rfl: 0 .  nortriptyline (PAMELOR) 10 MG capsule, Take 1 capsule (10 mg total) by mouth at bedtime., Disp: 30 capsule, Rfl: 3 .  Omeprazole 20 MG TBEC, TAKE 1 TABLET (20 MG TOTAL) BY MOUTH DAILY., Disp: 28 tablet, Rfl: 3  No Known Allergies   ROS  Constitutional: Negative for fever, positive  weight change.  Respiratory: Negative for cough and shortness of breath.   Cardiovascular: Negative for chest pain or palpitations.  Gastrointestinal: Negative for abdominal pain, no bowel changes.  Musculoskeletal: Negative  for gait problem ( but has difficulty getting up from the floor )or joint swelling.  Skin: Negative for rash.  Neurological: positive  for dizziness and  Headache occasionally .  No other specific complaints in a complete review of systems (except as listed in HPI above).  Objective  Vitals:   02/27/16 0830  BP: (!) 148/96  Pulse: 72  Resp: 16  Temp: 98 F (36.7 C)  TempSrc: Oral  SpO2: 97%   Weight: 226 lb 1.6 oz (102.6 kg)  Height: 5\' 9"  (1.753 m)    Body mass index is 33.39 kg/m.  Physical Exam  Constitutional: Patient appears well-developed and well-nourished. Obese  No distress.  HEENT: head atraumatic, normocephalic, pupils equal and reactive to light, neck supple, throat within normal limits Cardiovascular: Normal rate, regular rhythm and normal heart sounds.  No murmur heard. No BLE edema. Pulmonary/Chest: Effort normal and breath sounds normal. No respiratory distress. Abdominal: Soft.  There is no tenderness. Psychiatric: Patient has a normal mood and affect. behavior is normal. Judgment and thought content normal. Muscular Skeletal: normal gait, normal strength of upper and lower extremities, normal back exam, no change in sensation   PHQ2/9: Depression screen [facility] 2/9 02/27/2016 11/26/2015 10/22/2015 09/03/2015 08/26/2015  Decreased Interest 0 0 0 0 0  Down, Depressed, Hopeless 0 0 0 0 0  PHQ - 2 Score 0 0 0 0 0     Fall Risk: Fall Risk  02/27/2016 11/26/2015 10/22/2015 09/03/2015 08/26/2015  Falls in the past year? No No No Yes Yes  Number falls in past yr: - - - 2 or more 2 or more  Injury with Fall? - - - Yes Yes  Risk for fall due to : - - - History of fall(s) History of fall(s);Impaired balance/gait  Follow up - - - - -     Functional Status Survey: Is the patient deaf or have difficulty hearing?: No Does the patient have difficulty seeing, even when wearing glasses/contacts?: No Does the patient have difficulty concentrating, remembering, or making decisions?: No Does the patient have difficulty walking or climbing stairs?: No Does the patient have difficulty dressing or bathing?: No Does the patient have difficulty doing errands alone such as visiting a doctor's office or shopping?: No   Assessment & Plan  1. Chronic tension-type headache, intractable  Doing better, taking prn medication  - Butalbital-APAP-Caff-Cod 50-300-40-30 MG CAPS; Take 1  capsule by mouth every 6 (six) hours.  Dispense: 30 capsule; Refill: 0 - nortriptyline (PAMELOR) 10 MG capsule; Take 1 capsule (10 mg total) by mouth at bedtime.  Dispense: 30 capsule; Refill: 3  2. Hyperlipidemia  -  atorvastatin (LIPITOR) 40 MG tablet; Take 1 tablet (40 mg total) by mouth daily.  Dispense: 30 tablet; Refill: 3  3. Vertigo  Doing well at this time  4. Obstructive sleep apnea  Explained importance of resuming CPAP machine use  5. Depression, major, recurrent, mild (HCC)  - buPROPion (WELLBUTRIN XL) 150 MG 24 hr tablet; Take 3 tablets (450 mg total) by mouth daily.  Dispense: 90 tablet; Refill: 3 - escitalopram (LEXAPRO) 20 MG tablet; Take 1 tablet (20 mg total) by mouth daily.  Dispense: 30 tablet; Refill: 3  6. CAD in native artery  - metoprolol succinate (TOPROL-XL) 50 MG 24 hr tablet; Take 1 tablet (50 mg total) by mouth every evening.  Dispense: 30 tablet; Refill: 3 - nitroGLYCERIN (NITROSTAT) 0.4 MG SL tablet; Place 1 tablet (0.4 mg total) under the tongue every 5 (five) minutes as needed. For chest pain.  Dispense: 30 tablet; Refill: 0 - Ambulatory referral to Cardiology  7. Hypertension, benign  - metoprolol succinate (TOPROL-XL) 50 MG 24 hr tablet; Take 1 tablet (50 mg total) by mouth every evening.  Dispense: 30 tablet; Refill: 3 - losartan-hydrochlorothiazide (HYZAAR) 100-25 MG tablet; Take 1 tablet by mouth daily.  Dispense: 30 tablet; Refill: 3 - amLODipine (NORVASC) 2.5 MG tablet; Take 1 tablet (2.5 mg total) by mouth daily.  Dispense: 30 tablet; Refill: 3  8. Unstable angina (HCC)  Refer to cardiologist   9. Paresthesia of right leg  - AMB referral to orthopedics  10. Leg weakness, bilateral  - AMB referral to orthopedics  11. Gastroesophageal reflux disease without esophagitis  - Omeprazole 20 MG TBEC; TAKE 1 TABLET (20 MG TOTAL) BY MOUTH DAILY.  Dispense: 28 tablet; Refill: 3

## 2016-03-10 ENCOUNTER — Encounter: Payer: Self-pay | Admitting: *Deleted

## 2016-03-23 ENCOUNTER — Telehealth: Payer: Self-pay | Admitting: Family Medicine

## 2016-03-23 NOTE — Telephone Encounter (Signed)
Unfortunately I can't , it is a controlled medication. No exceptions  I am sorry

## 2016-03-24 NOTE — Telephone Encounter (Signed)
Last refill was 02/27/16 for 30 pills, Dr. Ancil Boozer stated patient would not be able to new refill of medication until 03/29/2016.

## 2016-03-27 NOTE — Telephone Encounter (Signed)
PT NOTIIFED BY NURSE

## 2016-03-30 ENCOUNTER — Other Ambulatory Visit: Payer: Self-pay | Admitting: Family Medicine

## 2016-03-30 MED ORDER — BUTALBITAL-ASPIRIN-CAFFEINE 50-325-40 MG PO CAPS: 1.0000 | ORAL_CAPSULE | Freq: Four times a day (QID) | ORAL | 0 refills | Status: DC | PRN

## 2016-04-01 ENCOUNTER — Ambulatory Visit
Admission: RE | Admit: 2016-04-01 | Discharge: 2016-04-01 | Disposition: A | Discharge status: Home / Self Care | Payer: Medicaid Other | Source: Ambulatory Visit | Attending: Cardiology | Admitting: Cardiology

## 2016-04-01 ENCOUNTER — Encounter
Admission: RE | Disposition: A | Discharge status: Home / Self Care | Payer: Self-pay | Source: Ambulatory Visit | Attending: Cardiology

## 2016-04-01 ENCOUNTER — Encounter: Payer: Self-pay | Admitting: *Deleted

## 2016-04-01 DIAGNOSIS — I2510 Atherosclerotic heart disease of native coronary artery without angina pectoris: Secondary | ICD-10-CM | POA: Diagnosis not present

## 2016-04-01 DIAGNOSIS — I10 Essential (primary) hypertension: Secondary | ICD-10-CM | POA: Insufficient documentation

## 2016-04-01 DIAGNOSIS — Z820 Family history of epilepsy and other diseases of the nervous system: Secondary | ICD-10-CM | POA: Diagnosis not present

## 2016-04-01 DIAGNOSIS — F329 Major depressive disorder, single episode, unspecified: Secondary | ICD-10-CM | POA: Insufficient documentation

## 2016-04-01 DIAGNOSIS — Z7982 Long term (current) use of aspirin: Secondary | ICD-10-CM | POA: Insufficient documentation

## 2016-04-01 DIAGNOSIS — E785 Hyperlipidemia, unspecified: Secondary | ICD-10-CM | POA: Diagnosis not present

## 2016-04-01 DIAGNOSIS — Z79899 Other long term (current) drug therapy: Secondary | ICD-10-CM | POA: Insufficient documentation

## 2016-04-01 DIAGNOSIS — K219 Gastro-esophageal reflux disease without esophagitis: Secondary | ICD-10-CM | POA: Diagnosis not present

## 2016-04-01 DIAGNOSIS — Z809 Family history of malignant neoplasm, unspecified: Secondary | ICD-10-CM | POA: Insufficient documentation

## 2016-04-01 DIAGNOSIS — R079 Chest pain, unspecified: Secondary | ICD-10-CM | POA: Diagnosis present

## 2016-04-01 HISTORY — PX: CARDIAC CATHETERIZATION: SHX172

## 2016-04-01 HISTORY — DX: Sleep apnea, unspecified: G47.30

## 2016-04-01 SURGERY — LEFT HEART CATH AND CORONARY ANGIOGRAPHY
Anesthesia: Moderate Sedation

## 2016-04-01 MED ORDER — ASPIRIN 81 MG PO CHEW: 81.0000 mg | CHEWABLE_TABLET | ORAL | Status: DC

## 2016-04-01 MED ORDER — HEPARIN SODIUM (PORCINE) 1000 UNIT/ML IJ SOLN
INTRAMUSCULAR | Status: AC
  Filled 2016-04-01: qty 1

## 2016-04-01 MED ORDER — IOPAMIDOL (ISOVUE-300) INJECTION 61%
INTRAVENOUS | Status: DC | PRN
  Administered 2016-04-01: 95 mL via INTRA_ARTERIAL

## 2016-04-01 MED ORDER — SODIUM CHLORIDE 0.9 % WEIGHT BASED INFUSION: 3.0000 mL/kg/h | INTRAVENOUS | Status: DC

## 2016-04-01 MED ORDER — FENTANYL CITRATE (PF) 100 MCG/2ML IJ SOLN
INTRAMUSCULAR | Status: AC
  Filled 2016-04-01: qty 2

## 2016-04-01 MED ORDER — SODIUM CHLORIDE 0.9% FLUSH: 3.0000 mL | INTRAVENOUS | Status: DC | PRN

## 2016-04-01 MED ORDER — SODIUM CHLORIDE 0.9% FLUSH: 3.0000 mL | Freq: Two times a day (BID) | INTRAVENOUS | Status: DC

## 2016-04-01 MED ORDER — ADENOSINE (DIAGNOSTIC) 3 MG/ML IV SOLN
INTRAVENOUS | Status: AC
  Filled 2016-04-01: qty 30

## 2016-04-01 MED ORDER — HEPARIN (PORCINE) IN NACL 2-0.9 UNIT/ML-% IJ SOLN
INTRAMUSCULAR | Status: AC
  Filled 2016-04-01: qty 1000

## 2016-04-01 MED ORDER — FENTANYL CITRATE (PF) 100 MCG/2ML IJ SOLN
INTRAMUSCULAR | Status: DC | PRN
  Administered 2016-04-01: 25 ug via INTRAVENOUS

## 2016-04-01 MED ORDER — ADENOSINE (DIAGNOSTIC) 140MCG/KG/MIN
INTRAVENOUS | Status: DC | PRN
  Administered 2016-04-01: 140 ug/kg/min via INTRAVENOUS

## 2016-04-01 MED ORDER — MIDAZOLAM HCL 2 MG/2ML IJ SOLN
INTRAMUSCULAR | Status: DC | PRN
  Administered 2016-04-01: 1 mg via INTRAVENOUS

## 2016-04-01 MED ORDER — HEPARIN SODIUM (PORCINE) 1000 UNIT/ML IJ SOLN
INTRAMUSCULAR | Status: DC | PRN
  Administered 2016-04-01: 4000 [IU] via INTRAVENOUS

## 2016-04-01 MED ORDER — SODIUM CHLORIDE 0.9 % WEIGHT BASED INFUSION: 1.0000 mL/kg/h | INTRAVENOUS | Status: DC

## 2016-04-01 MED ORDER — SODIUM CHLORIDE 0.9 % IV SOLN: 250.0000 mL | INTRAVENOUS | Status: DC | PRN

## 2016-04-01 MED ORDER — MIDAZOLAM HCL 2 MG/2ML IJ SOLN
INTRAMUSCULAR | Status: AC
  Filled 2016-04-01: qty 2

## 2016-04-01 SURGICAL SUPPLY — 13 items
CATH 5FR JL4 DIAGNOSTIC (CATHETERS) ×3 IMPLANT
CATH 5FR JR4 DIAGNOSTIC (CATHETERS) ×4 IMPLANT
CATH INFINITI 5FR ANG PIGTAIL (CATHETERS) ×4 IMPLANT
CATH VISTA GUIDE 6FR XB3.5 SH (CATHETERS) ×4 IMPLANT
DEVICE CLOSURE MYNXGRIP 6/7F (Vascular Products) ×4 IMPLANT
DEVICE INFLAT 30 PLUS (MISCELLANEOUS) ×4 IMPLANT
KIT MANI 3VAL PERCEP (MISCELLANEOUS) ×4 IMPLANT
NEEDLE PERC 18GX7CM (NEEDLE) ×4 IMPLANT
PACK CARDIAC CATH (CUSTOM PROCEDURE TRAY) ×4 IMPLANT
SHEATH AVANTI 5FR X 11CM (SHEATH) ×4 IMPLANT
SHEATH AVANTI 6FR X 11CM (SHEATH) ×4 IMPLANT
WIRE EMERALD 3MM-J .035X150CM (WIRE) ×4 IMPLANT
WIRE PRESSURE VERRATA (WIRE) ×4 IMPLANT

## 2016-04-01 NOTE — Discharge Instructions (Signed)
Angiogram, Care After Refer to this sheet in the next few weeks. These instructions provide you with information about caring for yourself after your procedure. Your health care provider may also give you more specific instructions. Your treatment has been planned according to current medical practices, but problems sometimes occur. Call your health care provider if you have any problems or questions after your procedure. WHAT TO EXPECT AFTER THE PROCEDURE After your procedure, it is typical to have the following: Bruising at the catheter insertion site that usually fades within 1-2 weeks. Blood collecting in the tissue (hematoma) that may be painful to the touch. It should usually decrease in size and tenderness within 1-2 weeks. HOME CARE INSTRUCTIONS Take medicines only as directed by your health care provider. You may shower 24-48 hours after the procedure or as directed by your health care provider. Remove the bandage (dressing) and gently wash the site with plain soap and water. Pat the area dry with a clean towel. Do not rub the site, because this may cause bleeding. Do not take baths, swim, or use a hot tub until your health care provider approves. Check your insertion site every day for redness, swelling, or drainage. Do not apply powder or lotion to the site. Do not lift over 10 lb (4.5 kg) for 5 days after your procedure or as directed by your health care provider. Ask your health care provider when it is okay to: Return to work or school. Resume usual physical activities or sports. Resume sexual activity. Do not drive home if you are discharged the same day as the procedure. Have someone else drive you. You may drive 24 hours after the procedure unless otherwise instructed by your health care provider. Do not operate machinery or power tools for 24 hours after the procedure or as directed by your health care provider. If your procedure was done as an outpatient procedure, which means  that you went home the same day as your procedure, a responsible adult should be with you for the first 24 hours after you arrive home. Keep all follow-up visits as directed by your health care provider. This is important. SEEK MEDICAL CARE IF: You have a fever. You have chills. You have increased bleeding from the catheter insertion site. Hold pressure on the site. SEEK IMMEDIATE MEDICAL CARE IF: You have unusual pain at the catheter insertion site. You have redness, warmth, or swelling at the catheter insertion site. You have drainage (other than a small amount of blood on the dressing) from the catheter insertion site. The catheter insertion site is bleeding, and the bleeding does not stop after 30 minutes of holding steady pressure on the site. The area near or just beyond the catheter insertion site becomes pale, cool, tingly, or numb.   This information is not intended to replace advice given to you by your health care provider. Make sure you discuss any questions you have with your health care provider.   Document Released: 02/05/2005 Document Revised: 08/10/2014 Document Reviewed: 12/21/2012 Elsevier Interactive Patient Education 2016 [place]. Coronary Angiogram A coronary angiogram, also called coronary angiography, is an X-ray procedure used to look at the arteries in the heart. In this procedure, a dye (contrast dye) is injected through a long, hollow tube (catheter). The catheter is about the size of a piece of cooked spaghetti and is inserted through your groin, wrist, or arm. The dye is injected into each artery, and X-rays are then taken to show if there is a blockage  in the arteries of your heart. LET [facility] CARE PROVIDER KNOW ABOUT:  Any allergies you have, including allergies to shellfish or contrast dye.   All medicines you are taking, including vitamins, herbs, eye drops, creams, and over-the-counter medicines.   Previous problems you or members of your  family have had with the use of anesthetics.   Any blood disorders you have.   Previous surgeries you have had.  History of kidney problems or failure.   Other medical conditions you have. RISKS AND COMPLICATIONS  Generally, a coronary angiogram is a safe procedure. However, problems can occur and include:  Allergic reaction to the dye.  Bleeding from the access site or other locations.  Kidney injury, especially in people with impaired kidney function.  Stroke (rare).  Heart attack (rare). BEFORE THE PROCEDURE   Do not eat or drink anything after midnight the night before the procedure or as directed by your health care provider.   Ask your health care provider about changing or stopping your regular medicines. This is especially important if you are taking diabetes medicines or blood thinners. PROCEDURE  You may be given a medicine to help you relax (sedative) before the procedure. This medicine is given through an intravenous (IV) access tube that is inserted into one of your veins.   The area where the catheter will be inserted will be washed and shaved. This is usually done in the groin but may be done in the fold of your arm (near your elbow) or in the wrist.   A medicine will be given to numb the area where the catheter will be inserted (local anesthetic).   The health care provider will insert the catheter into an artery. The catheter will be guided by using a special type of X-ray (fluoroscopy) of the blood vessel being examined.   A special dye will then be injected into the catheter, and X-rays will be taken. The dye will help to show where any narrowing or blockages are located in the heart arteries.  AFTER THE PROCEDURE   If the procedure is done through the leg, you will be kept in bed lying flat for several hours. You will be instructed to not bend or cross your legs.  The insertion site will be checked frequently.   The pulse in your feet or  wrist will be checked frequently.   Additional blood tests, X-rays, and an electrocardiogram may be done.    This information is not intended to replace advice given to you by your health care provider. Make sure you discuss any questions you have with your health care provider.   Document Released: 01/24/2003 Document Revised: 08/10/2014 Document Reviewed: 12/12/2012 Elsevier Interactive Patient Education [employer].

## 2016-04-01 NOTE — H&P (Signed)
Chief Complaint: Chief Complaint  Patient presents with  . Follow-up  testing  Date of Service: 03/25/2016 Date of Birth: 1950-10-09 PCP: Loistine Chance, MD  History of Present Illness: Jimmy Green is a 65 y.o.male patient who presents for follow-up after referral for evaluation of chest discomfort as well as shortness of breath weakness and fatigue. Also complains of a sensation of restless leg syndrome. Electrocardiogram done it is primary care provider's office revealed a rate of 73 bpm and sinus rhythm. PR interval 186 ms, QRS duration 92 ms with a QTC of 431 ms and QRS axis -40. There is no ischemia. There are nonspecific ST-T wave changes however. Patient underwent a functional study as well as an echocardiogram. The echocardiogram revealed preserved LV function. There is no evidence of significant structural valvular abnormalities. Pulmonary pressures were normal. He also underwent a Lexiscan sestamibi which revealed an EF of 54%. There were no high-grade lesions noted. Patient continues to have exertional symptoms of shortness of breath and chest tightness. It is difficult for him to carry out any daily activities due to persistent symptoms. He denies syncope or presyncope. Risk factors include hypertension. He also has hyperlipidemia which is currently treated. Past Medical and Surgical History  Past Medical History Past Medical History:  Diagnosis Date  . Depression  . GERD (gastroesophageal reflux disease)  . Heart disease  . Hyperlipidemia  . Hypertension   Past Surgical History He has a past surgical history that includes repair inguinal hernia; knee surgery; and back surgery.   Medications and Allergies  Current Medications  Current Outpatient Prescriptions  Medication Sig Dispense Refill  . amLODIPine (NORVASC) 2.5 MG tablet Take by mouth.  [name] aspirin 81 MG EC tablet Take by mouth.  [name] atorvastatin (LIPITOR) 40 MG tablet Take by mouth.  [name] buPROPion (WELLBUTRIN XL) 150  MG XL tablet Take by mouth.  . butalbital-aspirin-caffeine-codeine (FIORINAL WITH CODEINE) capsule Take 1 capsule by mouth every 4 (four) hours as needed for Pain.  [name] escitalopram oxalate (LEXAPRO) 20 MG tablet Take by mouth.  [name] ibuprofen (ADVIL,MOTRIN) 800 MG tablet  . losartan-hydrochlorothiazide (HYZAAR) 100-12.5 mg tablet Take by mouth.  . meclizine (ANTIVERT) 25 mg tablet Take by mouth.  . metoprolol succinate (TOPROL-XL) 50 MG XL tablet Take by mouth.  . nitroGLYcerin (NITROSTAT) 0.4 MG SL tablet Place under the tongue.  . nortriptyline (PAMELOR) 10 MG capsule Take by mouth.  [name] omeprazole 20 mg Take by mouth.   No current facility-administered medications for this visit.   Allergies: Review of patient's allergies indicates no known allergies.  Social and Family History  Social History reports that he has quit smoking. He does not have any smokeless tobacco history on file. He reports that he does not drink alcohol or use illicit drugs.  Family History Family History  Problem Relation Age of Onset  . Alzheimer's disease Mother  . Cancer Father   Review of Systems  Review of Systems  Constitutional: Positive for malaise/fatigue. Negative for chills, diaphoresis, fever and weight loss.  HENT: Negative for congestion, ear discharge, hearing loss and tinnitus.  Eyes: Negative for blurred vision.  Respiratory: Positive for shortness of breath. Negative for cough, hemoptysis, sputum production and wheezing.  Cardiovascular: Positive for chest pain. Negative for palpitations, orthopnea, claudication, leg swelling and PND.  Gastrointestinal: Negative for abdominal pain, blood in stool, constipation, diarrhea, heartburn, melena, nausea and vomiting.  Genitourinary: Negative for dysuria, frequency, hematuria and urgency.  Musculoskeletal: Negative for back pain, falls, joint  pain and myalgias.  Skin: Negative for itching and rash.  Neurological: Positive for weakness. Negative for  dizziness, tingling, focal weakness, loss of consciousness and headaches.  Endo/Heme/Allergies: Negative for polydipsia. Does not bruise/bleed easily.  Psychiatric/Behavioral: Negative for depression, memory loss and substance abuse. The patient has insomnia. The patient is not nervous/anxious.   Physical Examination   Vitals:BP (!) 140/92  Pulse 68  Resp 12  Ht 175.3 cm (5\' 9" )  Wt (!) 103.4 kg (228 lb)  BMI 33.67 kg/m2 Ht:175.3 cm (5\' 9" ) Wt:(!) 103.4 kg (228 lb) [id number] surface area is 2.24 meters squared. Body mass index is 33.67 kg/(m^2).  Wt Readings from Last 3 Encounters:  03/25/16 (!) 103.4 kg (228 lb)  03/02/16 (!) 102.5 kg (226 lb)  09/25/15 100.6 kg (221 lb 12.8 oz)   BP Readings from Last 3 Encounters:  03/25/16 (!) 140/92  03/02/16 (!) 142/90  09/25/15 138/85   General appearance appears in no acute distress  Head Mouth and Eye exam Normocephalic, without obvious abnormality, atraumatic Dentition is good Eyes appear anicteric   Neck exam Thyroid: normal  Nodes: no obvious adenopathy  LUNGS Breath Sounds: Normal Percussion: Normal  CARDIOVASCULAR JVP CV wave: no HJR: no Elevation at 90 degrees: None Carotid Pulse: normal pulsation bilaterally Bruit: None Apex: apical impulse normal  Auscultation Rhythm: normal sinus rhythm S1: normal S2: normal Clicks: no Rub: no Murmurs: 1/6 medium pitched mid systolic blowing at lower left sternal border  Gallop: None ABDOMEN Liver enlargement: no Pulsatile aorta: no Ascites: no Bruits: no  EXTREMITIES Clubbing: no Edema: trace to 1+ bilateral pedal edema Pulses: peripheral pulses symmetrical Femoral Bruits: no Amputation: no SKIN Rash: no Cyanosis: no Embolic phemonenon: no Bruising: no NEURO Alert and Oriented to person, place and time: yes Non focal: yes  PSYCH: Pt appears to have normal affect  LABS REVIEWED Last 3 CBC results: Lab Results  Component Value Date  WBC 9.2  03/25/2016   Lab Results  Component Value Date  HGB 15.4 03/25/2016   Lab Results  Component Value Date  HCT 44.8 03/25/2016   Lab Results  Component Value Date  PLT 302 03/25/2016   Lab Results  Component Value Date  CREATININE 0.7 03/25/2016  BUN 11 03/25/2016  NA 139 03/25/2016  K 3.6 03/25/2016  CL 102 03/25/2016  CO2 32.5 (H) 03/25/2016   Diagnostic Studies Reviewed:  EKG EKG demonstrated normal sinus rhythm, nonspecific ST and T waves changes.  Assessment and Plan   65 y.o. male with  ICD-10-CM ICD-9-CM  1. Benign paroxysmal positional vertigo due to bilateral vestibular disorder-currently stable H81.13 386.11  2. CAD in native artery-has a history of PCI done approximately 2013. Has some exertional symptoms consistent with possible ischemia. There are nonspecific ST-T wave changes on his electrocardiogram. Functional study was not particularly abnormal however patient has persistent exertional symptoms. We will proceed with left cardiac cath to evaluate coronary anatomy to evaluate for evidence of significant disease due to persistent exertional symptoms and unstable angina despite unremarkable functional study. I25.10 414.01   3. Mixed hyperlipidemia-continue with low-fat diet. Atorvastatin also will be continued. Recommended LDL level of less than 100. E78.2 272.2  4. Obstructive sleep apnea-patient has a diagnosis of sleep apnea and currently does not have CPAP. He had a machine but it has malfunctioned and he no longer has it. He has definite signs and symptoms of possible sleep apnea. His restless leg syndrome daytime somnolence and some of his fatigue may be related to  this. He will need to get back on a CPAP. He will likely need a repeat sleep study to better assess the need for this. Will proceed with this after the cardiac catheterization. G47.33 327.23  5. Chronic tension-type headache, not intractable-currently stable G44.229 339.12  6. SOB (shortness of  breath)-etiology unclear. May be related to prior tobacco use versus sleep apnea versus pulmonary hypertension secondary to sleep apnea. R06.02 786.05  7. Daytime somnolence-referral for sleep study to evaluate the extent of probable sleep apnea. R40.0 780.54   Return in about 2 weeks (around 04/08/2016).  These notes generated with voice recognition software. I apologize for typographical errors.     Jimmy Docker Fath MD  H and P reveiwed. No change.

## 2016-04-02 ENCOUNTER — Encounter: Payer: Self-pay | Admitting: Cardiology

## 2016-04-08 NOTE — Telephone Encounter (Signed)
done

## 2016-05-11 ENCOUNTER — Ambulatory Visit (INDEPENDENT_AMBULATORY_CARE_PROVIDER_SITE_OTHER): Payer: Medicaid Other

## 2016-05-11 DIAGNOSIS — Z23 Encounter for immunization: Secondary | ICD-10-CM

## 2016-05-12 ENCOUNTER — Telehealth: Payer: Self-pay | Admitting: Family Medicine

## 2016-05-12 ENCOUNTER — Other Ambulatory Visit: Payer: Self-pay | Admitting: Family Medicine

## 2016-05-12 MED ORDER — BUTALBITAL-ASPIRIN-CAFFEINE 50-325-40 MG PO CAPS: 1.0000 | ORAL_CAPSULE | Freq: Four times a day (QID) | ORAL | 0 refills | Status: DC | PRN

## 2016-05-12 NOTE — Telephone Encounter (Signed)
30 should last 3 months, I will send 10 until follow up

## 2016-05-12 NOTE — Telephone Encounter (Signed)
Requesting refill on pain medication Butalbital.

## 2016-05-13 NOTE — Telephone Encounter (Signed)
Called patient and informed him by vm medication is ready for pick up.

## 2016-06-02 ENCOUNTER — Encounter: Payer: Self-pay | Admitting: Family Medicine

## 2016-06-02 ENCOUNTER — Ambulatory Visit (INDEPENDENT_AMBULATORY_CARE_PROVIDER_SITE_OTHER): Payer: Medicaid Other | Admitting: Family Medicine

## 2016-06-02 VITALS — BP 126/84 | HR 72 | Temp 98.0°F | Resp 16 | Ht 69.0 in | Wt 225.8 lb

## 2016-06-02 DIAGNOSIS — M65312 Trigger thumb, left thumb: Secondary | ICD-10-CM

## 2016-06-02 DIAGNOSIS — I2510 Atherosclerotic heart disease of native coronary artery without angina pectoris: Secondary | ICD-10-CM

## 2016-06-02 DIAGNOSIS — H903 Sensorineural hearing loss, bilateral: Secondary | ICD-10-CM | POA: Diagnosis not present

## 2016-06-02 DIAGNOSIS — I200 Unstable angina: Secondary | ICD-10-CM

## 2016-06-02 DIAGNOSIS — M65311 Trigger thumb, right thumb: Secondary | ICD-10-CM | POA: Insufficient documentation

## 2016-06-02 DIAGNOSIS — G4733 Obstructive sleep apnea (adult) (pediatric): Secondary | ICD-10-CM

## 2016-06-02 DIAGNOSIS — K219 Gastro-esophageal reflux disease without esophagitis: Secondary | ICD-10-CM | POA: Diagnosis not present

## 2016-06-02 DIAGNOSIS — I10 Essential (primary) hypertension: Secondary | ICD-10-CM

## 2016-06-02 DIAGNOSIS — E8881 Metabolic syndrome: Secondary | ICD-10-CM | POA: Diagnosis not present

## 2016-06-02 DIAGNOSIS — F330 Major depressive disorder, recurrent, mild: Secondary | ICD-10-CM | POA: Diagnosis not present

## 2016-06-02 DIAGNOSIS — E782 Mixed hyperlipidemia: Secondary | ICD-10-CM | POA: Diagnosis not present

## 2016-06-02 DIAGNOSIS — G44221 Chronic tension-type headache, intractable: Secondary | ICD-10-CM | POA: Diagnosis not present

## 2016-06-02 MED ORDER — METOPROLOL SUCCINATE ER 50 MG PO TB24: 50.0000 mg | ORAL_TABLET | Freq: Every evening | ORAL | 3 refills | Status: DC

## 2016-06-02 MED ORDER — ESCITALOPRAM OXALATE 20 MG PO TABS
20.0000 mg | ORAL_TABLET | Freq: Every day | ORAL | 3 refills | Status: DC
Start: 2016-06-02 — End: 2016-10-01

## 2016-06-02 MED ORDER — LOSARTAN POTASSIUM-HCTZ 100-25 MG PO TABS: 1.0000 | ORAL_TABLET | Freq: Every day | ORAL | 3 refills | Status: DC

## 2016-06-02 MED ORDER — MELATONIN 1 MG PO TABS: 1.0000 | ORAL_TABLET | Freq: Every evening | ORAL | 0 refills | Status: DC

## 2016-06-02 MED ORDER — ATORVASTATIN CALCIUM 40 MG PO TABS: 40.0000 mg | ORAL_TABLET | Freq: Every day | ORAL | 3 refills | Status: DC

## 2016-06-02 MED ORDER — NORTRIPTYLINE HCL 10 MG PO CAPS: 10.0000 mg | ORAL_CAPSULE | Freq: Every day | ORAL | 3 refills | Status: DC

## 2016-06-02 MED ORDER — OMEPRAZOLE 20 MG PO TBEC: DELAYED_RELEASE_TABLET | ORAL | 3 refills | Status: DC

## 2016-06-02 MED ORDER — BUPROPION HCL ER (XL) 150 MG PO TB24: 450.0000 mg | ORAL_TABLET | Freq: Every day | ORAL | 3 refills | Status: DC

## 2016-06-02 MED ORDER — AMLODIPINE BESYLATE 2.5 MG PO TABS
2.5000 mg | ORAL_TABLET | Freq: Every day | ORAL | 3 refills | Status: DC
Start: 2016-06-02 — End: 2016-10-01

## 2016-06-02 NOTE — Progress Notes (Signed)
Name: Jimmy Green   MRN: [id number]    DOB: 1951/06/16   Date:06/02/2016       Progress Note  Subjective  Chief Complaint  Chief Complaint  Patient presents with  . Follow-up  . Depression  . Hypertension    well controlled but occasionally dizziness   . Hyperlipidemia  . Migraine    once or twice a week  . Gastroesophageal Reflux    no issues  . Joint Pain    just in hands since last visit and getting worse    HPI  HTN: bp is at goal , she has been taking medication daily.  No chest pain or palpitation. No side effects of medication. BP at local pharmacy has been around 130's, occasionally up to 140  Hyperlipidemia: taking Atorvastatin and denies side effects. No myalgia  OSA: he got a CPAP machine that he got from a friend and is wearing 4 times weekly, explained that he needs to have it titrated for him. He has been sleeping well on Melatonin  History of basal cell carcinoma: Seeing Dermatologist at [facility], he recently had a biopsy  CAD: taking aspirin, statin and beta blocker and ARB, no chest pain or decrease in exercise tolerance. He was seen by Dr. Ubaldo Glassing in 04/2016, and had another heart cath that showed no significant disease, 55-70% stenosis on  the LAD , on medical management  Depression Major: taking Wellbutrin and Lexapro daily, no anhedonia, lack of appetite, feeling well, in remission.   Vertigo: symptoms started in Nov 2016. He had one episode of syncope 07/05/2015 went to [facility], head CT negative He states he has been having vertigo daily, triggered by head movement. Sometimes it was hard for him to walk in a straight line. He states no longer happens when he rolls in his bed, and episodes are very seldom now. He has also noticed some hearing loss - that has resolved, and also has noticed some tinnitus on the right ear that now also resolved  ( he was evaluated by ENT and advised to have evaluation for hearing aid and MRI with and without contrast  was negative ).  Taking Nortriptyline but only prn and headache resolves and occasionally Fioricet. He states no recent episodes  Trigger Thumb and OA: he cancelled appointment with Ortho because left thumb was getting better, but now has on right side, and it is aching and burning , advised to go to Ortho or take Tylenol, he has been taking Fioricet for it, but explained it can only be used for headache.   Patient Active Problem List   Diagnosis Date Noted  . Trigger thumb of both thumbs 06/02/2016  . Chronic tension-type headache, intractable 09/25/2015  . Benign paroxysmal positional nystagmus 09/25/2015  . Hearing loss sensory, bilateral 08/26/2015  . Cerebral microvascular disease 08/26/2015  . Metabolic syndrome [id number]  . Vertigo 06/21/2015  . Buzzing in ear 06/21/2015  . Hypertension goal BP (blood pressure) < 140/90 03/28/2015  . GERD (gastroesophageal reflux disease) 03/28/2015  . Hyperlipidemia 03/28/2015  . Depression, major 03/28/2015  . CAD in native artery 03/28/2015  . Actinic keratosis 02/14/2015  . Obstructive sleep apnea 02/03/2015  . Ileitis 01/31/2015  . Diverticulosis of colon without hemorrhage 01/31/2015  . Hx of colonic polyps   . Benign neoplasm of descending colon   . Benign neoplasm of sigmoid colon   . Ulceration of intestine   . Idiopathic colitis   . Sebaceous cyst 09/14/2014    Past  Surgical History:  Procedure Laterality Date  . BACK SURGERY    . BASAL CELL CARCINOMA EXCISION Left 12/02/2015   Chest-Done by Dermatologist   . CARDIAC CATHETERIZATION Left 04/01/2016   Procedure: Left Heart Cath and Coronary Angiography;  Surgeon: Teodoro Spray, MD;  Location: [place] [facility];  Service: Cardiovascular;  Laterality: Left;  . CARDIAC CATHETERIZATION N/A 04/01/2016   Procedure: Intravascular Pressure Wire/FFR Study;  Surgeon: Isaias Cowman, MD;  Location: [place] [facility];  Service: Cardiovascular;  Laterality: N/A;  . COLONOSCOPY  N/A 01/15/2015   Wohl-ileitis, 2 benign polyps, cryptitis, sigmoid diverticulosis, focal ulceration ICV  . CORONARY STENT PLACEMENT    . FRACTIONAL FLOW RESERVE WIRE  10/08/2011   Procedure: FRACTIONAL FLOW RESERVE WIRE;  Surgeon: Clent Demark, MD;  Location: [facility];  Service: Cardiovascular;;  . HERNIA REPAIR    . KNEE SURGERY    . LEFT HEART CATHETERIZATION WITH CORONARY ANGIOGRAM N/A 10/08/2011   Procedure: LEFT HEART CATHETERIZATION WITH CORONARY ANGIOGRAM;  Surgeon: Clent Demark, MD;  Location: [place] [facility]ATH LAB;  Service: Cardiovascular;  Laterality: N/A;    Family History  Problem Relation Age of Onset  . Lung cancer Father   . Heart disease Father   . Skin cancer Father     Social History   Social History  . Marital status: Married    Spouse name: N/A  . Number of children: N/A  . Years of education: N/A   Occupational History  . Not on file.   Social History Main Topics  . Smoking status: Former Smoker    Years: 40.00    Types: Cigarettes    Quit date: 08/03/2012  . Smokeless tobacco: Never Used  . Alcohol use No  . Drug use: No  . Sexual activity: Yes    Partners: Female   Other Topics Concern  . Not on file   Social History Narrative  . No narrative on file     Current Outpatient Prescriptions:  .  amLODipine (NORVASC) 2.5 MG tablet, Take 1 tablet (2.5 mg total) by mouth daily., Disp: 30 tablet, Rfl: 3 .  aspirin EC 81 MG tablet, Take 81 mg by mouth every morning. , Disp: , Rfl:  .  atorvastatin (LIPITOR) 40 MG tablet, Take 1 tablet (40 mg total) by mouth daily., Disp: 30 tablet, Rfl: 3 .  buPROPion (WELLBUTRIN XL) 150 MG 24 hr tablet, Take 3 tablets (450 mg total) by mouth daily., Disp: 90 tablet, Rfl: 3 .  butalbital-aspirin-caffeine (FIORINAL) 50-325-40 MG capsule, Take 1 capsule by mouth every 6 (six) hours as needed for headache., Disp: 30 capsule, Rfl: 0 .  escitalopram (LEXAPRO) 20 MG tablet, Take 1 tablet (20 mg total) by mouth daily., Disp: 30  tablet, Rfl: 3 .  losartan-hydrochlorothiazide (HYZAAR) 100-25 MG tablet, Take 1 tablet by mouth daily., Disp: 30 tablet, Rfl: 3 .  meclizine (ANTIVERT) 25 MG tablet, Take 1 tablet (25 mg total) by mouth 3 (three) times daily as needed for dizziness (May take 1-2 tablets every 8 hours as needed for dizziness.)., Disp: 20 tablet, Rfl: 0 .  metoprolol succinate (TOPROL-XL) 50 MG 24 hr tablet, Take 1 tablet (50 mg total) by mouth every evening., Disp: 30 tablet, Rfl: 3 .  nitroGLYCERIN (NITROSTAT) 0.4 MG SL tablet, Place 1 tablet (0.4 mg total) under the tongue every 5 (five) minutes as needed. For chest pain., Disp: 30 tablet, Rfl: 0 .  nortriptyline (PAMELOR) 10 MG capsule, Take 1 capsule (10  mg total) by mouth at bedtime., Disp: 30 capsule, Rfl: 3 .  Omeprazole 20 MG TBEC, TAKE 1 TABLET (20 MG TOTAL) BY MOUTH DAILY., Disp: 30 tablet, Rfl: 3 .  Melatonin 1 MG TABS, Take 1 tablet (1 mg total) by mouth every evening., Disp: 30 tablet, Rfl: 0  No Known Allergies   ROS  Constitutional: Negative for fever or significant weight change.  Respiratory: Negative for cough and shortness of breath.   Cardiovascular: Negative for chest pain or palpitations.  Gastrointestinal: Negative for abdominal pain, no bowel changes.  Musculoskeletal: Negative for gait problem or joint swelling.  Skin: Negative for rash.  Neurological: positive  For intermittent  dizziness and headache.  No other specific complaints in a complete review of systems (except as listed in HPI above).  Objective  Vitals:   06/02/16 0853  BP: 126/84  Pulse: 72  Resp: 16  Temp: 98 F (36.7 C)  SpO2: 95%  Weight: 225 lb 12.8 oz (102.4 kg)  Height: 5\' 9"  (1.753 m)    Body mass index is 33.34 kg/m.  Physical Exam  Constitutional: Patient appears well-developed and well-nourished. Obese  No distress.  HEENT: head atraumatic, normocephalic, pupils equal and reactive to light, eneck supple, throat within normal  limits Cardiovascular: Normal rate, regular rhythm and normal heart sounds.  No murmur heard. No BLE edema. Pulmonary/Chest: Effort normal and breath sounds normal. No respiratory distress. Abdominal: Soft.  There is no tenderness. Psychiatric: Patient has a normal mood and affect. behavior is normal. Judgment and thought content normal. Muscular Skeletal: trigger thumbs bilaterally  PHQ2/9: Depression screen [facility] 2/9 06/02/2016 02/27/2016 11/26/2015 10/22/2015 09/03/2015  Decreased Interest 0 0 0 0 0  Down, Depressed, Hopeless 0 0 0 0 0  PHQ - 2 Score 0 0 0 0 0     Fall Risk: Fall Risk  06/02/2016 02/27/2016 11/26/2015 10/22/2015 09/03/2015  Falls in the past year? No No No No Yes  Number falls in past yr: - - - - 2 or more  Injury with Fall? - - - - Yes  Risk for fall due to : - - - - History of fall(s)  Follow up - - - - -     Functional Status Survey: Is the patient deaf or have difficulty hearing?: No Does the patient have difficulty seeing, even when wearing glasses/contacts?: No Does the patient have difficulty concentrating, remembering, or making decisions?: No Does the patient have difficulty walking or climbing stairs?: No Does the patient have difficulty dressing or bathing?: No Does the patient have difficulty doing errands alone such as visiting a doctor's office or shopping?: No    Assessment & Plan  1. Chronic tension-type headache, intractable  - nortriptyline (PAMELOR) 10 MG capsule; Take 1 capsule (10 mg total) by mouth at bedtime.  Dispense: 30 capsule; Refill: 3  2. Mixed hyperlipidemia  - atorvastatin (LIPITOR) 40 MG tablet; Take 1 tablet (40 mg total) by mouth daily.  Dispense: 30 tablet; Refill: 3  3. Unstable angina (HCC)  Doing well at this time  4. Hypertension, benign  - amLODipine (NORVASC) 2.5 MG tablet; Take 1 tablet (2.5 mg total) by mouth daily.  Dispense: 30 tablet; Refill: 3 - losartan-hydrochlorothiazide (HYZAAR) 100-25 MG tablet; Take 1  tablet by mouth daily.  Dispense: 30 tablet; Refill: 3 - metoprolol succinate (TOPROL-XL) 50 MG 24 hr tablet; Take 1 tablet (50 mg total) by mouth every evening.  Dispense: 30 tablet; Refill: 3  5. Depression, major, recurrent, mild (  HCC)  - buPROPion (WELLBUTRIN XL) 150 MG 24 hr tablet; Take 3 tablets (450 mg total) by mouth daily.  Dispense: 90 tablet; Refill: 3 - escitalopram (LEXAPRO) 20 MG tablet; Take 1 tablet (20 mg total) by mouth daily.  Dispense: 30 tablet; Refill: 3 He still has fatigue, advised to lose weight   6. Obstructive sleep apnea  He needs to have study done  7. Metabolic syndrome  Recheck next visit   8. Hearing loss sensory, bilateral  stable  9. Trigger thumb of both thumbs  Take Tylenol   10. CAD in native artery  - metoprolol succinate (TOPROL-XL) 50 MG 24 hr tablet; Take 1 tablet (50 mg total) by mouth every evening.  Dispense: 30 tablet; Refill: 3  11. Gastroesophageal reflux disease without esophagitis  - Omeprazole 20 MG TBEC; TAKE 1 TABLET (20 MG TOTAL) BY MOUTH DAILY.  Dispense: 30 tablet; Refill: 3

## 2016-06-24 ENCOUNTER — Ambulatory Visit: Payer: Medicaid Other | Admitting: Family Medicine

## 2016-07-02 ENCOUNTER — Ambulatory Visit (INDEPENDENT_AMBULATORY_CARE_PROVIDER_SITE_OTHER): Payer: Medicaid Other | Admitting: Family Medicine

## 2016-07-02 ENCOUNTER — Ambulatory Visit
Admission: RE | Admit: 2016-07-02 | Discharge: 2016-07-02 | Disposition: A | Discharge status: Home / Self Care | Payer: Medicaid Other | Source: Ambulatory Visit | Attending: Family Medicine | Admitting: Family Medicine

## 2016-07-02 ENCOUNTER — Encounter: Payer: Self-pay | Admitting: Family Medicine

## 2016-07-02 VITALS — BP 118/62 | HR 67 | Temp 98.1°F | Resp 95 | Wt 225.4 lb

## 2016-07-02 DIAGNOSIS — M25552 Pain in left hip: Secondary | ICD-10-CM

## 2016-07-02 DIAGNOSIS — R208 Other disturbances of skin sensation: Secondary | ICD-10-CM | POA: Diagnosis not present

## 2016-07-02 DIAGNOSIS — R200 Anesthesia of skin: Secondary | ICD-10-CM

## 2016-07-02 MED ORDER — BUTALBITAL-ASPIRIN-CAFFEINE 50-325-40 MG PO CAPS: 1.0000 | ORAL_CAPSULE | Freq: Four times a day (QID) | ORAL | 0 refills | Status: DC | PRN

## 2016-07-02 MED ORDER — PREDNISONE 10 MG (48) PO TBPK: ORAL_TABLET | Freq: Every day | ORAL | 0 refills | Status: DC

## 2016-07-02 NOTE — Progress Notes (Signed)
Name: Jimmy Green   MRN: [id number]    DOB: 03-19-1951   Date:07/02/2016       Progress Note  Subjective  Chief Complaint  Chief Complaint  Patient presents with  . Leg Pain    Pain started about a month ago for some unknown reason?  . Hip Pain  . Hand Pain    Thumb on left hand is painful & goes numb.     HPI  Left hip pain: symptoms started suddenly a couple of months ago, he denies any injuries or change in activity preceding symptoms. He states pain is getting progressively worse. He is having difficulty bearing weight and walking. He also states pain worse with activity or when laying on left lateral decubitus. Pain is described aching and at times shoots down lateral calf. He feels like left leg is weaker than right side. No bowel or bladder incontinence. No numbness on tingling on his leg. Denies back pain  Left thumb: he states that a few months ago he noticed intermittent shooting pain left thumb but a constant numbness on it, no weakness, trigger symptoms resolved. No neck pain or shoulder problems.   Patient Active Problem List   Diagnosis Date Noted  . Trigger thumb of both thumbs 06/02/2016  . Chronic tension-type headache, intractable 09/25/2015  . Benign paroxysmal positional nystagmus 09/25/2015  . Hearing loss sensory, bilateral 08/26/2015  . Cerebral microvascular disease 08/26/2015  . Metabolic syndrome 123XX123  . Vertigo 06/21/2015  . Buzzing in ear 06/21/2015  . Hypertension goal BP (blood pressure) < 140/90 03/28/2015  . GERD (gastroesophageal reflux disease) 03/28/2015  . Hyperlipidemia 03/28/2015  . Depression, major 03/28/2015  . CAD in native artery 03/28/2015  . Actinic keratosis 02/14/2015  . Obstructive sleep apnea 02/03/2015  . Ileitis 01/31/2015  . Diverticulosis of colon without hemorrhage 01/31/2015  . Hx of colonic polyps   . Benign neoplasm of descending colon   . Benign neoplasm of sigmoid colon   . Ulceration of intestine   .  Idiopathic colitis   . Sebaceous cyst 09/14/2014    Past Surgical History:  Procedure Laterality Date  . BACK SURGERY    . BASAL CELL CARCINOMA EXCISION Left 12/02/2015   Chest-Done by Dermatologist   . CARDIAC CATHETERIZATION Left 04/01/2016   Procedure: Left Heart Cath and Coronary Angiography;  Surgeon: Teodoro Spray, MD;  Location: [place] [facility];  Service: Cardiovascular;  Laterality: Left;  . CARDIAC CATHETERIZATION N/A 04/01/2016   Procedure: Intravascular Pressure Wire/FFR Study;  Surgeon: Isaias Cowman, MD;  Location: [place] [facility];  Service: Cardiovascular;  Laterality: N/A;  . COLONOSCOPY N/A 01/15/2015   Wohl-ileitis, 2 benign polyps, cryptitis, sigmoid diverticulosis, focal ulceration ICV  . CORONARY STENT PLACEMENT    . FRACTIONAL FLOW RESERVE WIRE  10/08/2011   Procedure: FRACTIONAL FLOW RESERVE WIRE;  Surgeon: Clent Demark, MD;  Location: [facility] C[facility];  Service: Cardiovascular;;  . HERNIA REPAIR    . KNEE SURGERY    . LEFT HEART CATHETERIZATION WITH CORONARY ANGIOGRAM N/A 10/08/2011   Procedure: LEFT HEART CATHETERIZATION WITH CORONARY ANGIOGRAM;  Surgeon: Clent Demark, MD;  Location: [place] [facility];  Service: Cardiovascular;  Laterality: N/A;    Family History  Problem Relation Age of Onset  . Lung cancer Father   . Heart disease Father   . Skin cancer Father     Social History   Social History  . Marital status: Married    Spouse name: N/A  .  Number of children: N/A  . Years of education: N/A   Occupational History  . Not on file.   Social History Main Topics  . Smoking status: Former Smoker    Years: 40.00    Types: Cigarettes    Quit date: 08/03/2012  . Smokeless tobacco: Never Used  . Alcohol use No  . Drug use: No  . Sexual activity: Yes    Partners: Female   Other Topics Concern  . Not on file   Social History Narrative  . No narrative on file     Current Outpatient Prescriptions:  .  amLODipine (NORVASC) 2.5 MG tablet,  Take 1 tablet (2.5 mg total) by mouth daily., Disp: 30 tablet, Rfl: 3 .  aspirin EC 81 MG tablet, Take 81 mg by mouth every morning. , Disp: , Rfl:  .  atorvastatin (LIPITOR) 40 MG tablet, Take 1 tablet (40 mg total) by mouth daily., Disp: 30 tablet, Rfl: 3 .  buPROPion (WELLBUTRIN XL) 150 MG 24 hr tablet, Take 3 tablets (450 mg total) by mouth daily., Disp: 90 tablet, Rfl: 3 .  butalbital-aspirin-caffeine (FIORINAL) 50-325-40 MG capsule, Take 1 capsule by mouth every 6 (six) hours as needed for headache., Disp: 30 capsule, Rfl: 0 .  escitalopram (LEXAPRO) 20 MG tablet, Take 1 tablet (20 mg total) by mouth daily., Disp: 30 tablet, Rfl: 3 .  losartan-hydrochlorothiazide (HYZAAR) 100-25 MG tablet, Take 1 tablet by mouth daily., Disp: 30 tablet, Rfl: 3 .  meclizine (ANTIVERT) 25 MG tablet, Take 1 tablet (25 mg total) by mouth 3 (three) times daily as needed for dizziness (May take 1-2 tablets every 8 hours as needed for dizziness.)., Disp: 20 tablet, Rfl: 0 .  metoprolol succinate (TOPROL-XL) 50 MG 24 hr tablet, Take 1 tablet (50 mg total) by mouth every evening., Disp: 30 tablet, Rfl: 3 .  nitroGLYCERIN (NITROSTAT) 0.4 MG SL tablet, Place 1 tablet (0.4 mg total) under the tongue every 5 (five) minutes as needed. For chest pain., Disp: 30 tablet, Rfl: 0 .  Omeprazole 20 MG TBEC, TAKE 1 TABLET (20 MG TOTAL) BY MOUTH DAILY., Disp: 30 tablet, Rfl: 3 .  Melatonin 1 MG TABS, Take 1 tablet (1 mg total) by mouth every evening. (Patient not taking: Reported on 07/02/2016), Disp: 30 tablet, Rfl: 0 .  nortriptyline (PAMELOR) 10 MG capsule, Take 1 capsule (10 mg total) by mouth at bedtime. (Patient not taking: Reported on 07/02/2016), Disp: 30 capsule, Rfl: 3 .  predniSONE (STERAPRED UNI-PAK 48 TAB) 10 MG (48) TBPK tablet, Take by mouth daily. Take as directed, Disp: 48 tablet, Rfl: 0  No Known Allergies   ROS  Ten systems reviewed and is negative except as mentioned in HPI   Objective  Vitals:   07/02/16  1155  BP: 118/62  Pulse: 67  Resp: (!) 95  Temp: 98.1 F (36.7 C)  Weight: 225 lb 6.4 oz (102.2 kg)    Body mass index is 33.29 kg/m.  Physical Exam  Constitutional: Patient appears well-developed and well-nourished. Obese No distress.  HEENT: head atraumatic, normocephalic, pupils equal and reactive to light,  neck supple, throat within normal limits Cardiovascular: Normal rate, regular rhythm and normal heart sounds.  No murmur heard. No BLE edema. Pulmonary/Chest: Effort normal and breath sounds normal. No respiratory distress. Abdominal: Soft.  There is no tenderness. Psychiatric: Patient has a normal mood and affect. behavior is normal. Judgment and thought content normal. Muscular Skeletal: mild pain with flexion of spine at 90 degrees, normal  extension and lateral bending, pain with internal and external rotation of left hip, positive straight leg raise. Antalgic gait  PHQ2/9: Depression screen [facility] 2/9 07/02/2016 06/02/2016 02/27/2016 11/26/2015 10/22/2015  Decreased Interest 0 0 0 0 0  Down, Depressed, Hopeless 0 0 0 0 0  PHQ - 2 Score 0 0 0 0 0     Fall Risk: Fall Risk  07/02/2016 06/02/2016 02/27/2016 11/26/2015 10/22/2015  Falls in the past year? No No No No No  Number falls in past yr: - - - - -  Injury with Fall? - - - - -  Risk for fall due to : - - - - -  Follow up - - - - -     Functional Status Survey: Is the patient deaf or have difficulty hearing?: No Does the patient have difficulty seeing, even when wearing glasses/contacts?: No Does the patient have difficulty concentrating, remembering, or making decisions?: No Does the patient have difficulty walking or climbing stairs?: Yes Does the patient have difficulty dressing or bathing?: No Does the patient have difficulty doing errands alone such as visiting a doctor's office or shopping?: No    Assessment & Plan  1. Left hip pain  - predniSONE (STERAPRED UNI-PAK 48 TAB) 10 MG (48) TBPK tablet; Take by  mouth daily. Take as directed  Dispense: 48 tablet; Refill: 0 - DG HIP UNILAT WITH PELVIS 2-3 VIEWS LEFT; Future It may be radiculitis, trochanteric bursitis or hip OA. We will check X-ray try prednisone and if no improvement or abnormal x-ray refer to Ortho, he prefers not going to Ortho at this time. Explained possible side effects and importance of taking it with food  2. Numbness of left thumb  Intermittent symptoms, discussed EMG or referral to Ortho but he wants to hold off for now

## 2016-07-03 NOTE — Progress Notes (Signed)
Yes. [facility] can manage back pain

## 2016-07-22 ENCOUNTER — Encounter: Payer: Self-pay | Admitting: Family Medicine

## 2016-07-22 ENCOUNTER — Telehealth: Payer: Self-pay | Admitting: Family Medicine

## 2016-07-22 ENCOUNTER — Other Ambulatory Visit: Payer: Self-pay | Admitting: Family Medicine

## 2016-07-22 ENCOUNTER — Ambulatory Visit (INDEPENDENT_AMBULATORY_CARE_PROVIDER_SITE_OTHER): Payer: Medicaid Other | Admitting: Family Medicine

## 2016-07-22 VITALS — BP 120/70 | HR 68 | Temp 98.3°F | Resp 16 | Ht 69.0 in | Wt 223.6 lb

## 2016-07-22 DIAGNOSIS — R05 Cough: Secondary | ICD-10-CM

## 2016-07-22 DIAGNOSIS — M65312 Trigger thumb, left thumb: Secondary | ICD-10-CM | POA: Diagnosis not present

## 2016-07-22 DIAGNOSIS — M65311 Trigger thumb, right thumb: Secondary | ICD-10-CM | POA: Diagnosis not present

## 2016-07-22 DIAGNOSIS — M25552 Pain in left hip: Secondary | ICD-10-CM

## 2016-07-22 DIAGNOSIS — R0989 Other specified symptoms and signs involving the circulatory and respiratory systems: Secondary | ICD-10-CM

## 2016-07-22 DIAGNOSIS — R059 Cough, unspecified: Secondary | ICD-10-CM

## 2016-07-22 MED ORDER — AZITHROMYCIN 250 MG PO TABS: ORAL_TABLET | ORAL | 0 refills | Status: DC

## 2016-07-22 MED ORDER — IBUPROFEN 800 MG PO TABS: 800.0000 mg | ORAL_TABLET | Freq: Three times a day (TID) | ORAL | 0 refills | Status: DC | PRN

## 2016-07-22 MED ORDER — FLUTICASONE FUROATE-VILANTEROL 100-25 MCG/INH IN AEPB: 1.0000 | INHALATION_SPRAY | Freq: Every day | RESPIRATORY_TRACT | 0 refills | Status: DC

## 2016-07-22 NOTE — Telephone Encounter (Signed)
Patient states that you had prescribed ibuprofen but he had informed you that the medication causes him to have stomach problems. He also states that you where going to prescribe zpack however it has not been sent to [facility]

## 2016-07-22 NOTE — Progress Notes (Signed)
Name: Jimmy Green   MRN: [id number]    DOB: October 15, 1950   Date:07/22/2016       Progress Note  Subjective  Chief Complaint  Chief Complaint  Patient presents with  . Hip Pain    pt here for 3 week follow up for hip and thumb pain. Pt stated that the pain has gotten better    HPI  Left hip pain: he took prednisone taper and pain on left hip resolved, no longer radiating to knee, and has good rom. No longer limping. X-ray only showed mild OA , likely bursitis that was causing symptoms.   Trigger thumb: responded to prednisone taper, doing well, no longer gets stuck, still has mild numbness on dorsal aspect of right thumb, but is mild and we will not do any further testing at this time  Possible flu: he had a severe URI a few weeks ago, he had a fever, rhinorrhea, body aches,  And cough. All symptoms resolved, but he still has a cough, that is dry, he has occasional wheezing now, but no SOB.    Patient Active Problem List   Diagnosis Date Noted  . Trigger thumb of both thumbs 06/02/2016  . Chronic tension-type headache, intractable 09/25/2015  . Benign paroxysmal positional nystagmus 09/25/2015  . Hearing loss sensory, bilateral 08/26/2015  . Cerebral microvascular disease 08/26/2015  . Metabolic syndrome 123XX123  . Vertigo 06/21/2015  . Buzzing in ear 06/21/2015  . Hypertension goal BP (blood pressure) < 140/90 03/28/2015  . GERD (gastroesophageal reflux disease) 03/28/2015  . Hyperlipidemia 03/28/2015  . Depression, major 03/28/2015  . CAD in native artery 03/28/2015  . Actinic keratosis 02/14/2015  . Obstructive sleep apnea 02/03/2015  . Ileitis 01/31/2015  . Diverticulosis of colon without hemorrhage 01/31/2015  . Hx of colonic polyps   . Benign neoplasm of descending colon   . Benign neoplasm of sigmoid colon   . Ulceration of intestine   . Idiopathic colitis   . Sebaceous cyst 09/14/2014    Past Surgical History:  Procedure Laterality Date  . BACK SURGERY     . BASAL CELL CARCINOMA EXCISION Left 12/02/2015   Chest-Done by Dermatologist   . CARDIAC CATHETERIZATION Left 04/01/2016   Procedure: Left Heart Cath and Coronary Angiography;  Surgeon: Teodoro Spray, MD;  Location: [place] [facility];  Service: Cardiovascular;  Laterality: Left;  . CARDIAC CATHETERIZATION N/A 04/01/2016   Procedure: Intravascular Pressure Wire/FFR Study;  Surgeon: Isaias Cowman, MD;  Location: [place] [facility];  Service: Cardiovascular;  Laterality: N/A;  . COLONOSCOPY N/A 01/15/2015   Wohl-ileitis, 2 benign polyps, cryptitis, sigmoid diverticulosis, focal ulceration ICV  . CORONARY STENT PLACEMENT    . FRACTIONAL FLOW RESERVE WIRE  10/08/2011   Procedure: FRACTIONAL FLOW RESERVE WIRE;  Surgeon: Clent Demark, MD;  Location: [facility] CATH LAB;  Service: Cardiovascular;;  . HERNIA REPAIR    . KNEE SURGERY    . LEFT HEART CATHETERIZATION WITH CORONARY ANGIOGRAM N/A 10/08/2011   Procedure: LEFT HEART CATHETERIZATION WITH CORONARY ANGIOGRAM;  Surgeon: Clent Demark, MD;  Location: [place] [facility];  Service: Cardiovascular;  Laterality: N/A;    Family History  Problem Relation Age of Onset  . Lung cancer Father   . Heart disease Father   . Skin cancer Father     Social History   Social History  . Marital status: Married    Spouse name: N/A  . Number of children: N/A  . Years of education: N/A  Occupational History  . Not on file.   Social History Main Topics  . Smoking status: Former Smoker    Years: 40.00    Types: Cigarettes    Quit date: 08/03/2012  . Smokeless tobacco: Never Used  . Alcohol use No  . Drug use: No  . Sexual activity: Yes    Partners: Female   Other Topics Concern  . Not on file   Social History Narrative  . No narrative on file     Current Outpatient Prescriptions:  .  amLODipine (NORVASC) 2.5 MG tablet, Take 1 tablet (2.5 mg total) by mouth daily., Disp: 30 tablet, Rfl: 3 .  aspirin EC 81 MG tablet, Take 81 mg by mouth every  morning. , Disp: , Rfl:  .  atorvastatin (LIPITOR) 40 MG tablet, Take 1 tablet (40 mg total) by mouth daily., Disp: 30 tablet, Rfl: 3 .  buPROPion (WELLBUTRIN XL) 150 MG 24 hr tablet, Take 3 tablets (450 mg total) by mouth daily., Disp: 90 tablet, Rfl: 3 .  butalbital-aspirin-caffeine (FIORINAL) 50-325-40 MG capsule, Take 1 capsule by mouth every 6 (six) hours as needed for headache., Disp: 30 capsule, Rfl: 0 .  escitalopram (LEXAPRO) 20 MG tablet, Take 1 tablet (20 mg total) by mouth daily., Disp: 30 tablet, Rfl: 3 .  losartan-hydrochlorothiazide (HYZAAR) 100-25 MG tablet, Take 1 tablet by mouth daily., Disp: 30 tablet, Rfl: 3 .  meclizine (ANTIVERT) 25 MG tablet, Take 1 tablet (25 mg total) by mouth 3 (three) times daily as needed for dizziness (May take 1-2 tablets every 8 hours as needed for dizziness.)., Disp: 20 tablet, Rfl: 0 .  Melatonin 1 MG TABS, Take 1 tablet (1 mg total) by mouth every evening., Disp: 30 tablet, Rfl: 0 .  metoprolol succinate (TOPROL-XL) 50 MG 24 hr tablet, Take 1 tablet (50 mg total) by mouth every evening., Disp: 30 tablet, Rfl: 3 .  nitroGLYCERIN (NITROSTAT) 0.4 MG SL tablet, Place 1 tablet (0.4 mg total) under the tongue every 5 (five) minutes as needed. For chest pain., Disp: 30 tablet, Rfl: 0 .  Omeprazole 20 MG TBEC, TAKE 1 TABLET (20 MG TOTAL) BY MOUTH DAILY., Disp: 30 tablet, Rfl: 3 .  fluticasone furoate-vilanterol (BREO ELLIPTA) 100-25 MCG/INH AEPB, Inhale 1 puff into the lungs daily., Disp: 60 each, Rfl: 0 .  ibuprofen (ADVIL,MOTRIN) 800 MG tablet, Take 1 tablet (800 mg total) by mouth every 8 (eight) hours as needed., Disp: 60 tablet, Rfl: 0  Allergies  Allergen Reactions  . Elavil [Amitriptyline Hcl] Rash     ROS  Constitutional: Negative for fever or weight change.  Respiratory: Negative for cough and shortness of breath.   Cardiovascular: Negative for chest pain or palpitations.  Gastrointestinal: Negative for abdominal pain, no bowel changes.   Musculoskeletal: Negative for gait problem or joint swelling.  Skin: Negative for rash.  Neurological: Negative for dizziness or headache.  No other specific complaints in a complete review of systems (except as listed in HPI above).  Objective  Vitals:   07/22/16 1011  BP: 120/70  Pulse: 68  Resp: 16  Temp: 98.3 F (36.8 C)  SpO2: 97%  Weight: 223 lb 9 oz (101.4 kg)  Height: 5\' 9"  (1.753 m)    Body mass index is 33.01 kg/m.  Physical Exam  Constitutional: Patient appears well-developed and well-nourished. Obese No distress.  HEENT: head atraumatic, normocephalic, pupils equal and reactive to light,  neck supple, throat within normal limits Cardiovascular: Normal rate, regular rhythm and normal  heart sounds.  No murmur heard. No BLE edema. Pulmonary/Chest: Effort normal and crackles and rhonchi worse on right lower base.  No respiratory distress. Abdominal: Soft.  There is no tenderness. Psychiatric: Patient has a normal mood and affect. behavior is normal. Judgment and thought content normal.  PHQ2/9: Depression screen [facility] 2/9 07/02/2016 06/02/2016 02/27/2016 11/26/2015 10/22/2015  Decreased Interest 0 0 0 0 0  Down, Depressed, Hopeless 0 0 0 0 0  PHQ - 2 Score 0 0 0 0 0    Fall Risk: Fall Risk  07/02/2016 06/02/2016 02/27/2016 11/26/2015 10/22/2015  Falls in the past year? No No No No No  Number falls in past yr: - - - - -  Injury with Fall? - - - - -  Risk for fall due to : - - - - -  Follow up - - - - -    Assessment & Plan  1. Cough  He has a severe URI and still has mild cough, we will try Breo - fluticasone furoate-vilanterol (BREO ELLIPTA) 100-25 MCG/INH AEPB; Inhale 1 puff into the lungs daily.  Dispense: 60 each; Refill: 0  2. Left hip pain  - ibuprofen (ADVIL,MOTRIN) 800 MG tablet; Take 1 tablet (800 mg total) by mouth every 8 (eight) hours as needed.  Dispense: 60 tablet; Refill: 0  3. Trigger thumb of both thumbs  - ibuprofen (ADVIL,MOTRIN) 800 MG  tablet; Take 1 tablet (800 mg total) by mouth every 8 (eight) hours as needed.  Dispense: 60 tablet; Refill: 0  4. Lung crackles  - azithromycin (ZITHROMAX) 250 MG tablet; 2 tabs first day and one daily after that  Dispense: 6 tablet; Refill: 0 Return for CXR if no improvement

## 2016-07-23 ENCOUNTER — Telehealth: Payer: Self-pay

## 2016-07-23 ENCOUNTER — Other Ambulatory Visit: Payer: Self-pay | Admitting: Family Medicine

## 2016-07-23 MED ORDER — FLUTICASONE-SALMETEROL 250-50 MCG/DOSE IN AEPB: 1.0000 | INHALATION_SPRAY | Freq: Two times a day (BID) | RESPIRATORY_TRACT | 0 refills | Status: DC

## 2016-07-23 NOTE — Telephone Encounter (Signed)
Sent Advair

## 2016-07-23 NOTE — Telephone Encounter (Signed)
Patient notified

## 2016-07-23 NOTE — Telephone Encounter (Signed)
Patient wife called and states patient went to pick up medication but the Memory Dance is not covered by Medicaid. Please switch to another inhaler, Medicaid covers: Advair, Dulera, Symbicort, Qvar, Pulmicort. Please switch to a covered inhaler and send into pharmacy. Thanks

## 2016-07-24 ENCOUNTER — Encounter: Payer: Self-pay | Admitting: Family Medicine

## 2016-07-24 ENCOUNTER — Other Ambulatory Visit: Payer: Self-pay

## 2016-07-24 MED ORDER — FLUTICASONE-SALMETEROL 115-21 MCG/ACT IN AERO: 2.0000 | INHALATION_SPRAY | Freq: Two times a day (BID) | RESPIRATORY_TRACT | 12 refills | Status: DC

## 2016-07-24 NOTE — Telephone Encounter (Signed)
PA done and pharmacy has been called

## 2016-08-05 ENCOUNTER — Other Ambulatory Visit: Payer: Self-pay

## 2016-08-05 MED ORDER — FLUTICASONE-SALMETEROL 250-50 MCG/DOSE IN AEPB: 1.0000 | INHALATION_SPRAY | Freq: Two times a day (BID) | RESPIRATORY_TRACT | 2 refills | Status: DC

## 2016-10-01 ENCOUNTER — Other Ambulatory Visit: Payer: Self-pay | Admitting: Family Medicine

## 2016-10-01 ENCOUNTER — Encounter: Payer: Self-pay | Admitting: Family Medicine

## 2016-10-01 ENCOUNTER — Ambulatory Visit (INDEPENDENT_AMBULATORY_CARE_PROVIDER_SITE_OTHER): Payer: Medicaid Other | Admitting: Family Medicine

## 2016-10-01 VITALS — BP 136/84 | HR 72 | Temp 98.2°F | Resp 16 | Ht 69.0 in | Wt 219.9 lb

## 2016-10-01 DIAGNOSIS — M25552 Pain in left hip: Secondary | ICD-10-CM

## 2016-10-01 DIAGNOSIS — F330 Major depressive disorder, recurrent, mild: Secondary | ICD-10-CM

## 2016-10-01 DIAGNOSIS — I2510 Atherosclerotic heart disease of native coronary artery without angina pectoris: Secondary | ICD-10-CM

## 2016-10-01 DIAGNOSIS — G4733 Obstructive sleep apnea (adult) (pediatric): Secondary | ICD-10-CM

## 2016-10-01 DIAGNOSIS — M65311 Trigger thumb, right thumb: Secondary | ICD-10-CM

## 2016-10-01 DIAGNOSIS — I200 Unstable angina: Secondary | ICD-10-CM | POA: Diagnosis not present

## 2016-10-01 DIAGNOSIS — R5383 Other fatigue: Secondary | ICD-10-CM

## 2016-10-01 DIAGNOSIS — E8881 Metabolic syndrome: Secondary | ICD-10-CM

## 2016-10-01 DIAGNOSIS — I10 Essential (primary) hypertension: Secondary | ICD-10-CM | POA: Diagnosis not present

## 2016-10-01 DIAGNOSIS — E782 Mixed hyperlipidemia: Secondary | ICD-10-CM

## 2016-10-01 DIAGNOSIS — K219 Gastro-esophageal reflux disease without esophagitis: Secondary | ICD-10-CM | POA: Diagnosis not present

## 2016-10-01 DIAGNOSIS — G44221 Chronic tension-type headache, intractable: Secondary | ICD-10-CM | POA: Diagnosis not present

## 2016-10-01 DIAGNOSIS — R7309 Other abnormal glucose: Secondary | ICD-10-CM

## 2016-10-01 DIAGNOSIS — M65312 Trigger thumb, left thumb: Secondary | ICD-10-CM

## 2016-10-01 LAB — CBC WITH DIFFERENTIAL/PLATELET
BASOS ABS: 73 {cells}/uL (ref 0–200)
Basophils Relative: 1 %
Eosinophils Absolute: 219 cells/uL (ref 15–500)
Eosinophils Relative: 3 %
HEMATOCRIT: 46 % (ref 38.5–50.0)
Hemoglobin: 15.6 g/dL (ref 13.2–17.1)
LYMPHS PCT: 32 %
Lymphs Abs: 2336 cells/uL (ref 850–3900)
MCH: 31.1 pg (ref 27.0–33.0)
MCHC: 33.9 g/dL (ref 32.0–36.0)
MCV: 91.8 fL (ref 80.0–100.0)
MONO ABS: 511 {cells}/uL (ref 200–950)
MPV: 10.4 fL (ref 7.5–12.5)
Monocytes Relative: 7 %
NEUTROS PCT: 57 %
Neutro Abs: 4161 cells/uL (ref 1500–7800)
Platelets: 304 10*3/uL (ref 140–400)
RBC: 5.01 MIL/uL (ref 4.20–5.80)
RDW: 13.7 % (ref 11.0–15.0)
WBC: 7.3 10*3/uL (ref 3.8–10.8)

## 2016-10-01 LAB — LIPID PANEL
Cholesterol: 132 mg/dL (ref <200)
HDL: 27 mg/dL — AB (ref >40)
LDL Cholesterol: 76 mg/dL (ref <100)
TRIGLYCERIDES: 146 mg/dL (ref <150)
Total CHOL/HDL Ratio: 4.9 Ratio (ref <5.0)
VLDL: 29 mg/dL (ref <30)

## 2016-10-01 LAB — COMPLETE METABOLIC PANEL WITH GFR
ALT: 35 U/L (ref 9–46)
AST: 26 U/L (ref 10–35)
Albumin: 4 g/dL (ref 3.6–5.1)
Alkaline Phosphatase: 87 U/L (ref 40–115)
BUN: 9 mg/dL (ref 7–25)
CALCIUM: 9.7 mg/dL (ref 8.6–10.3)
CHLORIDE: 101 mmol/L (ref 98–110)
CO2: 30 mmol/L (ref 20–31)
Creat: 0.73 mg/dL (ref 0.70–1.25)
Glucose, Bld: 104 mg/dL — ABNORMAL HIGH (ref 65–99)
POTASSIUM: 3.8 mmol/L (ref 3.5–5.3)
Sodium: 138 mmol/L (ref 135–146)
Total Bilirubin: 1.6 mg/dL — ABNORMAL HIGH (ref 0.2–1.2)
Total Protein: 6.6 g/dL (ref 6.1–8.1)

## 2016-10-01 LAB — VITAMIN B12: VITAMIN B 12: 295 pg/mL (ref 200–1100)

## 2016-10-01 LAB — TSH: TSH: 0.77 mIU/L (ref 0.40–4.50)

## 2016-10-01 MED ORDER — BUTALBITAL-ASPIRIN-CAFFEINE 50-325-40 MG PO CAPS: 1.0000 | ORAL_CAPSULE | Freq: Four times a day (QID) | ORAL | 0 refills | Status: DC | PRN

## 2016-10-01 MED ORDER — METOPROLOL SUCCINATE ER 50 MG PO TB24: 50.0000 mg | ORAL_TABLET | Freq: Every evening | ORAL | 3 refills | Status: DC

## 2016-10-01 MED ORDER — AMLODIPINE BESYLATE 2.5 MG PO TABS: 2.5000 mg | ORAL_TABLET | Freq: Every day | ORAL | 3 refills | Status: DC

## 2016-10-01 MED ORDER — OMEPRAZOLE 20 MG PO TBEC
DELAYED_RELEASE_TABLET | ORAL | 3 refills | Status: DC
Start: 2016-10-01 — End: 2017-02-01

## 2016-10-01 MED ORDER — ATORVASTATIN CALCIUM 40 MG PO TABS: 40.0000 mg | ORAL_TABLET | Freq: Every day | ORAL | 3 refills | Status: DC

## 2016-10-01 MED ORDER — LOSARTAN POTASSIUM-HCTZ 100-25 MG PO TABS: 1.0000 | ORAL_TABLET | Freq: Every day | ORAL | 3 refills | Status: DC

## 2016-10-01 MED ORDER — BUPROPION HCL ER (XL) 150 MG PO TB24: 450.0000 mg | ORAL_TABLET | Freq: Every day | ORAL | 3 refills | Status: DC

## 2016-10-01 MED ORDER — ESCITALOPRAM OXALATE 20 MG PO TABS: 20.0000 mg | ORAL_TABLET | Freq: Every day | ORAL | 3 refills | Status: DC

## 2016-10-01 NOTE — Progress Notes (Signed)
Name: Jimmy Green   MRN: [id number]    DOB: September 25, 1950   Date:10/01/2016       Progress Note  Subjective  Chief Complaint  Chief Complaint  Patient presents with  . Medication Refill  . Migraine    Has been having them frequently, dull headache won't go away  . Hip Pain    Can not get down with out having trouble getting up, constant pain on his left side  . Hypertension  . Depression    Stable  . Urinary Retention    Has to go to bathroom constantly but can not get all his urine out.     HPI  HTN: bp is at goal , she has been taking medication daily.  No chest pain or palpitation. No side effects of medication. BP at local pharmacy and is around 130's/80's  Hyperlipidemia: taking Atorvastatin and denies side effects. No myalgia. We will recheck labs   OSA: he got a CPAP machine that he got from a friend and was wearing 4 times weekly, but not over the past four months because the mask was not fitting properly. He wakes up with a headache feels tired all day. We will re-schedule a sleep study   History of basal cell carcinoma: Seeing Dermatologist at [facility].   CAD: taking aspirin, statin and beta blocker and ARB,and he has NTG in his pocket. No chest pain or decrease in exercise tolerance. He was seen by Dr. Ubaldo Glassing in 04/2016, and had another heart cath that showed no significant disease, 55-70% stenosis on  the LAD , on medical management  Depression Major: taking Wellbutrin and Lexapro daily, no anhedonia, lack of appetite, feeling well, in remission.   Vertigo: symptoms started in Nov 2016. He had one episode of syncope 07/05/2015 went to [facility], head CT negative He states he has been having vertigo daily, triggered by head movement. Sometimes it washard for him to walk in a straight line. He states no longer happens when he rolls in his bed, and episodes are very seldom now. He has also noticed some hearing loss - that has resolved, and also has noticed some  tinnitus on the right ear that now also resolved ( he was evaluated by ENT and advised to have evaluation for hearing aid and MRI with and without contrast was negative ). Symptoms have improved, occasionally has dizziness when looking at a pond while fishing  Trigger Thumb and OA: he cancelled appointment with Ortho because left thumb was getting better, but is getting worse again. No longer catching but is numb on top of left thumb.   Left hip pain: he took prednisone taper back Fall 2017 and pain on left hip resolved, no longer radiating to knee, and has good rom. No longer limping. X-ray only showed mild OA , likely bursitis versus sciatica since it now radiates down to left lateral calf.   Other fatigue: he feels tired all the time, but states it is not secondary to depression  Patient Active Problem List   Diagnosis Date Noted  . Trigger thumb of both thumbs 06/02/2016  . Chronic tension-type headache, intractable 09/25/2015  . Benign paroxysmal positional vertigo due to bilateral vestibular disorder 09/25/2015  . Hearing loss sensory, bilateral 08/26/2015  . Cerebral microvascular disease 08/26/2015  . Metabolic syndrome [id number]  . Vertigo 06/21/2015  . Buzzing in ear 06/21/2015  . Hypertension goal BP (blood pressure) < 140/90 03/28/2015  . GERD (gastroesophageal reflux disease) 03/28/2015  . Hyperlipidemia  03/28/2015  . Depression, major 03/28/2015  . CAD in native artery 03/28/2015  . Actinic keratosis 02/14/2015  . Obstructive sleep apnea 02/03/2015  . Ileitis 01/31/2015  . Diverticulosis of colon without hemorrhage 01/31/2015  . Hx of colonic polyps   . Benign neoplasm of descending colon   . Benign neoplasm of sigmoid colon   . Ulceration of intestine   . Idiopathic colitis   . Sebaceous cyst 09/14/2014    Past Surgical History:  Procedure Laterality Date  . BACK SURGERY    . BASAL CELL CARCINOMA EXCISION Left 12/02/2015   Chest-Done by Dermatologist   .  CARDIAC CATHETERIZATION Left 04/01/2016   Procedure: Left Heart Cath and Coronary Angiography;  Surgeon: Teodoro Spray, MD;  Location: [place] [facility];  Service: Cardiovascular;  Laterality: Left;  . CARDIAC CATHETERIZATION N/A 04/01/2016   Procedure: Intravascular Pressure Wire/FFR Study;  Surgeon: Isaias Cowman, MD;  Location: [place] [facility];  Service: Cardiovascular;  Laterality: N/A;  . COLONOSCOPY N/A 01/15/2015   Wohl-ileitis, 2 benign polyps, cryptitis, sigmoid diverticulosis, focal ulceration ICV  . CORONARY STENT PLACEMENT    . FRACTIONAL FLOW RESERVE WIRE  10/08/2011   Procedure: FRACTIONAL FLOW RESERVE WIRE;  Surgeon: Clent Demark, MD;  Location: [facility];  Service: Cardiovascular;;  . HERNIA REPAIR    . KNEE SURGERY    . LEFT HEART CATHETERIZATION WITH CORONARY ANGIOGRAM N/A 10/08/2011   Procedure: LEFT HEART CATHETERIZATION WITH CORONARY ANGIOGRAM;  Surgeon: Clent Demark, MD;  Location: [place] [facility];  Service: Cardiovascular;  Laterality: N/A;    Family History  Problem Relation Age of Onset  . Lung cancer Father   . Heart disease Father   . Skin cancer Father     Social History   Social History  . Marital status: Married    Spouse name: N/A  . Number of children: N/A  . Years of education: N/A   Occupational History  . Not on file.   Social History Main Topics  . Smoking status: Former Smoker    Years: 40.00    Types: Cigarettes    Quit date: 08/03/2012  . Smokeless tobacco: Never Used  . Alcohol use No  . Drug use: No  . Sexual activity: Yes    Partners: Female   Other Topics Concern  . Not on file   Social History Narrative  . No narrative on file     Current Outpatient Prescriptions:  .  amLODipine (NORVASC) 2.5 MG tablet, Take 1 tablet (2.5 mg total) by mouth daily., Disp: 30 tablet, Rfl: 3 .  aspirin EC 81 MG tablet, Take 81 mg by mouth every morning. , Disp: , Rfl:  .  atorvastatin (LIPITOR) 40 MG tablet, Take 1 tablet (40 mg  total) by mouth daily., Disp: 30 tablet, Rfl: 3 .  buPROPion (WELLBUTRIN XL) 150 MG 24 hr tablet, Take 3 tablets (450 mg total) by mouth daily., Disp: 90 tablet, Rfl: 3 .  butalbital-aspirin-caffeine (FIORINAL) 50-325-40 MG capsule, Take 1 capsule by mouth every 6 (six) hours as needed for headache., Disp: 30 capsule, Rfl: 0 .  escitalopram (LEXAPRO) 20 MG tablet, Take 1 tablet (20 mg total) by mouth daily., Disp: 30 tablet, Rfl: 3 .  ibuprofen (ADVIL,MOTRIN) 800 MG tablet, Take 1 tablet (800 mg total) by mouth every 8 (eight) hours as needed., Disp: 60 tablet, Rfl: 0 .  losartan-hydrochlorothiazide (HYZAAR) 100-25 MG tablet, Take 1 tablet by mouth daily., Disp: 30 tablet, Rfl: 3 .  Melatonin 1 MG TABS, Take 1 tablet (1 mg total) by mouth every evening., Disp: 30 tablet, Rfl: 0 .  metoprolol succinate (TOPROL-XL) 50 MG 24 hr tablet, Take 1 tablet (50 mg total) by mouth every evening., Disp: 30 tablet, Rfl: 3 .  nitroGLYCERIN (NITROSTAT) 0.4 MG SL tablet, Place 1 tablet (0.4 mg total) under the tongue every 5 (five) minutes as needed. For chest pain., Disp: 30 tablet, Rfl: 0 .  Omeprazole 20 MG TBEC, TAKE 1 TABLET (20 MG TOTAL) BY MOUTH DAILY., Disp: 30 tablet, Rfl: 3  Allergies  Allergen Reactions  . Elavil [Amitriptyline Hcl] Rash     ROS  Constitutional: Negative for fever , positive for weight change - he has lost weight .  Respiratory: Negative for cough and shortness of breath.   Cardiovascular: Negative for chest pain or palpitations.  Gastrointestinal: Negative for abdominal pain, no bowel changes.  Musculoskeletal: Positive  for gait problem but no  joint swelling.  Skin: Negative for rash.  Neurological: Positive for intermittent dizziness and  headache.  No other specific complaints in a complete review of systems (except as listed in HPI above).  Objective  Vitals:   10/01/16 0928  BP: 136/84  Pulse: 72  Resp: 16  Temp: 98.2 F (36.8 C)  TempSrc: Oral  SpO2: 95%   Weight: 219 lb 14.4 oz (99.7 kg)  Height: 5\' 9"  (1.753 m)    Body mass index is 32.47 kg/m.  Physical Exam  Constitutional: Patient appears well-developed and well-nourished. Obese  No distress.  HEENT: head atraumatic, normocephalic, pupils equal and reactive to light,  neck supple, throat within normal limits Cardiovascular: Normal rate, regular rhythm and normal heart sounds.  No murmur heard. No BLE edema. Pulmonary/Chest: Effort normal and breath sounds normal. No respiratory distress. Abdominal: Soft.  There is no tenderness. Psychiatric: Patient has a normal mood and affect. behavior is normal. Judgment and thought content normal. Muscular Skeletal: normal rom of hip, pain during of left lateral hip, over trochanteric bursa. Decrease rom of lumbar spine, but no pain during palpation, negative straight leg raise  PHQ2/9: Depression screen [facility] 2/9 10/01/2016 07/02/2016 06/02/2016 02/27/2016 11/26/2015  Decreased Interest 1 0 0 0 0  Down, Depressed, Hopeless 0 0 0 0 0  PHQ - 2 Score 1 0 0 0 0     Fall Risk: Fall Risk  10/01/2016 07/02/2016 06/02/2016 02/27/2016 11/26/2015  Falls in the past year? No No No No No  Number falls in past yr: - - - - -  Injury with Fall? - - - - -  Risk for fall due to : - - - - -  Follow up - - - - -    Functional Status Survey: Is the patient deaf or have difficulty hearing?: No Does the patient have difficulty seeing, even when wearing glasses/contacts?: No Does the patient have difficulty concentrating, remembering, or making decisions?: No Does the patient have difficulty walking or climbing stairs?: No Does the patient have difficulty dressing or bathing?: No Does the patient have difficulty doing errands alone such as visiting a doctor's office or shopping?: No   Assessment & Plan  1. Chronic tension-type headache, intractable  - butalbital-aspirin-caffeine (FIORINAL) 50-325-40 MG capsule; Take 1 capsule by mouth every 6 (six) hours as  needed for headache.  Dispense: 30 capsule; Refill: 0  2. Trigger thumb of both thumbs  - Ambulatory referral to Orthopedic Surgery  3. Left hip pain  - Ambulatory referral to Orthopedic Surgery  4. Mixed hyperlipidemia  - atorvastatin (LIPITOR) 40 MG tablet; Take 1 tablet (40 mg total) by mouth daily.  Dispense: 30 tablet; Refill: 3 - Lipid panel  5. Unstable angina (HCC)  Doing well at this time  6. Hypertension, benign  - metoprolol succinate (TOPROL-XL) 50 MG 24 hr tablet; Take 1 tablet (50 mg total) by mouth every evening.  Dispense: 30 tablet; Refill: 3 - losartan-hydrochlorothiazide (HYZAAR) 100-25 MG tablet; Take 1 tablet by mouth daily.  Dispense: 30 tablet; Refill: 3 - amLODipine (NORVASC) 2.5 MG tablet; Take 1 tablet (2.5 mg total) by mouth daily.  Dispense: 30 tablet; Refill: 3 - CBC with Differential/Platelet - COMPLETE METABOLIC PANEL WITH GFR  7. Depression, major, recurrent, mild (HCC)  - escitalopram (LEXAPRO) 20 MG tablet; Take 1 tablet (20 mg total) by mouth daily.  Dispense: 30 tablet; Refill: 3 - buPROPion (WELLBUTRIN XL) 150 MG 24 hr tablet; Take 3 tablets (450 mg total) by mouth daily.  Dispense: 90 tablet; Refill: 3  8. Obstructive sleep apnea  He has not been wearing CPAP machine - explained importance of resuming mask, he needs to have another study   9. Metabolic syndrome  Discussed life style modification   10. Gastroesophageal reflux disease without esophagitis  - Omeprazole 20 MG TBEC; TAKE 1 TABLET (20 MG TOTAL) BY MOUTH DAILY.  Dispense: 30 tablet; Refill: 3  11. CAD in native artery  - metoprolol succinate (TOPROL-XL) 50 MG 24 hr tablet; Take 1 tablet (50 mg total) by mouth every evening.  Dispense: 30 tablet; Refill: 3 - Lipid panel  12. Other fatigue  Discussed testosterone level, but since we would not replace it we will not check the level  - CBC with Differential/Platelet - COMPLETE METABOLIC PANEL WITH GFR - VITAMIN D 25  Hydroxy (Vit-D Deficiency, Fractures) - Vitamin B12 - TSH  13. Elevated glucose level  - Hemoglobin A1c - Insulin, fasting

## 2016-10-02 LAB — HEMOGLOBIN A1C
Hgb A1c MFr Bld: 6.7 % — ABNORMAL HIGH (ref <5.7)
MEAN PLASMA GLUCOSE: 146 mg/dL

## 2016-10-02 LAB — INSULIN, FASTING: INSULIN FASTING, SERUM: 17.7 u[IU]/mL (ref 2.0–19.6)

## 2016-10-02 LAB — VITAMIN D 25 HYDROXY (VIT D DEFICIENCY, FRACTURES): VIT D 25 HYDROXY: 27 ng/mL — AB (ref 30–100)

## 2016-10-04 ENCOUNTER — Encounter: Payer: Self-pay | Admitting: Family Medicine

## 2016-10-04 DIAGNOSIS — E1169 Type 2 diabetes mellitus with other specified complication: Secondary | ICD-10-CM | POA: Insufficient documentation

## 2016-10-04 DIAGNOSIS — E785 Hyperlipidemia, unspecified: Secondary | ICD-10-CM

## 2016-10-08 ENCOUNTER — Ambulatory Visit (INDEPENDENT_AMBULATORY_CARE_PROVIDER_SITE_OTHER): Payer: Medicaid Other

## 2016-10-08 DIAGNOSIS — E538 Deficiency of other specified B group vitamins: Secondary | ICD-10-CM | POA: Diagnosis not present

## 2016-10-08 MED ORDER — CYANOCOBALAMIN 1000 MCG/ML IJ SOLN
1000.0000 ug | Freq: Once | INTRAMUSCULAR | Status: AC
  Administered 2016-10-08: 1000 ug via INTRAMUSCULAR

## 2016-10-09 NOTE — Progress Notes (Signed)
lft message for pt to schedule appt for 1 month fu and can have his B12 injection at the same time.

## 2016-10-15 ENCOUNTER — Ambulatory Visit (INDEPENDENT_AMBULATORY_CARE_PROVIDER_SITE_OTHER): Payer: Medicaid Other | Admitting: Family Medicine

## 2016-10-15 ENCOUNTER — Encounter: Payer: Self-pay | Admitting: Family Medicine

## 2016-10-15 VITALS — BP 122/82 | HR 71 | Temp 98.2°F | Resp 18 | Ht 69.0 in | Wt 221.8 lb

## 2016-10-15 DIAGNOSIS — E1169 Type 2 diabetes mellitus with other specified complication: Secondary | ICD-10-CM

## 2016-10-15 DIAGNOSIS — F330 Major depressive disorder, recurrent, mild: Secondary | ICD-10-CM | POA: Diagnosis not present

## 2016-10-15 DIAGNOSIS — E785 Hyperlipidemia, unspecified: Secondary | ICD-10-CM

## 2016-10-15 MED ORDER — METFORMIN HCL ER 500 MG PO TB24: 500.0000 mg | ORAL_TABLET | Freq: Every day | ORAL | 2 refills | Status: DC

## 2016-10-15 NOTE — Progress Notes (Signed)
Name: Jimmy Green   MRN: [id number]    DOB: 1951/07/22   Date:10/15/2016       Progress Note  Subjective  Chief Complaint  Chief Complaint  Patient presents with  . Diabetes    Discuss therapy and elevated blood levels  . Depression    Needs a increase in dosage, feels more anxious lately. Feels like the cold keeps him indoors and makes him feel tired, but during the summer time feels better. Patient tried Vitamin D but made him nauseous.     HPI  Depression Major: he states he is ready for the weather to improve so he can go outside. 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 His PHQ 9 has gotten worse.  Dyslipidemia with new onset type II DM: he denies polyphagia, polydipsia or polyuria. His hgbA1C was 6.7% a couple of weeks ago. Explained importance of life style modification and diabetic class ( he refuses ), exercise, aspirin daily, ARB, improving HDL, and to continue statin therapy. Advised yearly eye exam.   Patient Active Problem List   Diagnosis Date Noted  . Dyslipidemia associated with type 2 diabetes mellitus ([place]) 10/04/2016  . Trigger thumb of both thumbs 06/02/2016  . Chronic tension-type headache, intractable 09/25/2015  . Benign paroxysmal positional vertigo due to bilateral vestibular disorder 09/25/2015  . Hearing loss sensory, bilateral 08/26/2015  . Cerebral microvascular disease 08/26/2015  . Metabolic syndrome 51/70/0174  . Vertigo 06/21/2015  . Buzzing in ear 06/21/2015  . Hypertension goal BP (blood pressure) < 140/90 03/28/2015  . GERD (gastroesophageal reflux disease) 03/28/2015  . Hyperlipidemia 03/28/2015  . Depression, major 03/28/2015  . CAD in native artery 03/28/2015  . Actinic keratosis 02/14/2015  . Obstructive sleep apnea 02/03/2015  . Ileitis 01/31/2015  . Diverticulosis of colon without hemorrhage 01/31/2015  . Hx of colonic polyps   . Benign neoplasm of descending colon   . Benign neoplasm of sigmoid  colon   . Ulceration of intestine   . Idiopathic colitis   . Sebaceous cyst 09/14/2014    Past Surgical History:  Procedure Laterality Date  . BACK SURGERY    . BASAL CELL CARCINOMA EXCISION Left 12/02/2015   Chest-Done by Dermatologist   . CARDIAC CATHETERIZATION Left 04/01/2016   Procedure: Left Heart Cath and Coronary Angiography;  Surgeon: Teodoro Spray, MD;  Location: [facility];  Service: Cardiovascular;  Laterality: Left;  . CARDIAC CATHETERIZATION N/A 04/01/2016   Procedure: Intravascular Pressure Wire/FFR Study;  Surgeon: Isaias Cowman, MD;  Location: [facility];  Service: Cardiovascular;  Laterality: N/A;  . COLONOSCOPY N/A 01/15/2015   Wohl-ileitis, 2 benign polyps, cryptitis, sigmoid diverticulosis, focal ulceration ICV  . CORONARY STENT PLACEMENT    . FRACTIONAL FLOW RESERVE WIRE  10/08/2011   Procedure: FRACTIONAL FLOW RESERVE WIRE;  Surgeon: Clent Demark, MD;  Location: [facility];  Service: Cardiovascular;;  . HERNIA REPAIR    . KNEE SURGERY    . LEFT HEART CATHETERIZATION WITH CORONARY ANGIOGRAM N/A 10/08/2011   Procedure: LEFT HEART CATHETERIZATION WITH CORONARY ANGIOGRAM;  Surgeon: Clent Demark, MD;  Location: [facility];  Service: Cardiovascular;  Laterality: N/A;    Family History  Problem Relation Age of Onset  . Lung cancer Father   . Heart disease Father   . Skin cancer Father     Social History   Social History  . Marital status: Married    Spouse name: N/A  .  Number of children: N/A  . Years of education: N/A   Occupational History  . Not on file.   Social History Main Topics  . Smoking status: Former Smoker    Years: 40.00    Types: Cigarettes    Quit date: 08/03/2012  . Smokeless tobacco: Never Used  . Alcohol use No  . Drug use: No  . Sexual activity: Yes    Partners: Female   Other Topics Concern  . Not on file   Social History Narrative  . No narrative on file     Current Outpatient Prescriptions:   .  ADVAIR DISKUS 250-50 MCG/DOSE AEPB, , Disp: , Rfl: 0 .  amLODipine (NORVASC) 2.5 MG tablet, Take 1 tablet (2.5 mg total) by mouth daily., Disp: 30 tablet, Rfl: 3 .  aspirin EC 81 MG tablet, Take 81 mg by mouth every morning. , Disp: , Rfl:  .  atorvastatin (LIPITOR) 40 MG tablet, Take 1 tablet (40 mg total) by mouth daily., Disp: 30 tablet, Rfl: 3 .  buPROPion (WELLBUTRIN XL) 150 MG 24 hr tablet, Take 3 tablets (450 mg total) by mouth daily., Disp: 90 tablet, Rfl: 3 .  butalbital-aspirin-caffeine (FIORINAL) 50-325-40 MG capsule, Take 1 capsule by mouth every 6 (six) hours as needed for headache., Disp: 30 capsule, Rfl: 0 .  escitalopram (LEXAPRO) 20 MG tablet, Take 1 tablet (20 mg total) by mouth daily., Disp: 30 tablet, Rfl: 3 .  ibuprofen (ADVIL,MOTRIN) 800 MG tablet, Take 1 tablet (800 mg total) by mouth every 8 (eight) hours as needed., Disp: 60 tablet, Rfl: 0 .  losartan-hydrochlorothiazide (HYZAAR) 100-25 MG tablet, Take 1 tablet by mouth daily., Disp: 30 tablet, Rfl: 3 .  Melatonin 1 MG TABS, Take 1 tablet (1 mg total) by mouth every evening., Disp: 30 tablet, Rfl: 0 .  metoprolol succinate (TOPROL-XL) 50 MG 24 hr tablet, Take 1 tablet (50 mg total) by mouth every evening., Disp: 30 tablet, Rfl: 3 .  nitroGLYCERIN (NITROSTAT) 0.4 MG SL tablet, Place 1 tablet (0.4 mg total) under the tongue every 5 (five) minutes as needed. For chest pain., Disp: 30 tablet, Rfl: 0 .  Omeprazole 20 MG TBEC, TAKE 1 TABLET (20 MG TOTAL) BY MOUTH DAILY., Disp: 30 tablet, Rfl: 3  Allergies  Allergen Reactions  . Elavil [Amitriptyline Hcl] Rash     ROS  Constitutional: Negative for fever or weight change.  Respiratory: Negative for cough and shortness of breath.   Cardiovascular: Negative for chest pain or palpitations.  Gastrointestinal: Negative for abdominal pain, no bowel changes.  Musculoskeletal: Negative for gait problem or joint swelling.  Skin: Negative for rash.  Neurological: Negative for  dizziness or headache.  No other specific complaints in a complete review of systems (except as listed in HPI above).  Objective  Vitals:   10/15/16 0951  BP: 122/82  Pulse: 71  Resp: 18  Temp: 98.2 F (36.8 C)  TempSrc: Oral  SpO2: 96%  Weight: 221 lb 12.8 oz (100.6 kg)  Height: _0  (1.753 m)    Body mass index is 32.75 kg/m.  Physical Exam  Constitutional: Patient appears well-developed and well-nourished. Obese No distress.  HEENT: head atraumatic, normocephalic, pupils equal and reactive to light, neck supple, throat within normal limits Cardiovascular: Normal rate, regular rhythm and normal heart sounds.  No murmur heard. No BLE edema. Pulmonary/Chest: Effort normal and breath sounds normal. No respiratory distress. Abdominal: Soft.  There is no tenderness. Psychiatric: Patient has a normal mood and affect.  behavior is normal. Judgment and thought content normal.  Recent Results (from the past 2160 hour(s))  Lipid panel     Status: Abnormal   Collection Time: 10/01/16 10:26 AM  Result Value Ref Range   Cholesterol 132 <200 mg/dL   Triglycerides 146 <150 mg/dL   HDL 27 (L) >40 mg/dL   Total CHOL/HDL Ratio 4.9 <5.0 Ratio   VLDL 29 <30 mg/dL   LDL Cholesterol 76 <100 mg/dL  CBC with Differential/Platelet     Status: None   Collection Time: 10/01/16 10:26 AM  Result Value Ref Range   WBC 7.3 3.8 - 10.8 K/uL   RBC 5.01 4.20 - 5.80 MIL/uL   Hemoglobin 15.6 13.2 - 17.1 g/dL   HCT 46.0 38.5 - 50.0 %   MCV 91.8 80.0 - 100.0 fL   MCH 31.1 27.0 - 33.0 pg   MCHC 33.9 32.0 - 36.0 g/dL   RDW 13.7 11.0 - 15.0 %   Platelets 304 140 - 400 K/uL   MPV 10.4 7.5 - 12.5 fL   Neutro Abs 4,161 1,500 - 7,800 cells/uL   Lymphs Abs 2,336 850 - 3,900 cells/uL   Monocytes Absolute 511 200 - 950 cells/uL   Eosinophils Absolute 219 15 - 500 cells/uL   Basophils Absolute 73 0 - 200 cells/uL   Neutrophils Relative % 57 %   Lymphocytes Relative 32 %   Monocytes Relative 7 %    Eosinophils Relative 3 %   Basophils Relative 1 %   Smear Review Criteria for review not met   COMPLETE METABOLIC PANEL WITH GFR     Status: Abnormal   Collection Time: 10/01/16 10:26 AM  Result Value Ref Range   Sodium 138 135 - 146 mmol/L   Potassium 3.8 3.5 - 5.3 mmol/L   Chloride 101 98 - 110 mmol/L   CO2 30 20 - 31 mmol/L   Glucose, Bld 104 (H) 65 - 99 mg/dL   BUN 9 7 - 25 mg/dL   Creat 0.73 0.70 - 1.25 mg/dL    Comment:   For patients > or = 65 years of age: The upper reference limit for Creatinine is approximately 13% higher for people identified as African-American.      Total Bilirubin 1.6 (H) 0.2 - 1.2 mg/dL   Alkaline Phosphatase 87 40 - 115 U/L   AST 26 10 - 35 U/L   ALT 35 9 - 46 U/L   Total Protein 6.6 6.1 - 8.1 g/dL   Albumin 4.0 3.6 - 5.1 g/dL   Calcium 9.7 8.6 - 10.3 mg/dL   GFR, Est African American >89 >=60 mL/min   GFR, Est Non African American >89 >=60 mL/min  Hemoglobin A1c     Status: Abnormal   Collection Time: 10/01/16 10:26 AM  Result Value Ref Range   Hgb A1c MFr Bld 6.7 (H) <5.7 %    Comment:   For someone without known diabetes, a hemoglobin A1c value of 6.5% or greater indicates that they may have diabetes and this should be confirmed with a follow-up test.   For someone with known diabetes, a value <7% indicates that their diabetes is well controlled and a value greater than or equal to 7% indicates suboptimal control. A1c targets should be individualized based on duration of diabetes, age, comorbid conditions, and other considerations.   Currently, no consensus exists for use of hemoglobin A1c for diagnosis of diabetes for children.      Mean Plasma Glucose 146 mg/dL  Insulin, fasting  Status: None   Collection Time: 10/01/16 10:26 AM  Result Value Ref Range   Insulin fasting, serum 17.7 2.0 - 19.6 uIU/mL    Comment:   This insulin assay shows strong cross-reactivity for some insulin analogs (lispro, aspart, and glargine) and  much lower cross-reactivity with others (detemir, glulisine).   Stimulated Insulin reference intervals were established using the Siemens Immulite assay. These values are provided for general guidance only.   VITAMIN D 25 Hydroxy (Vit-D Deficiency, Fractures)     Status: Abnormal   Collection Time: 10/01/16 10:26 AM  Result Value Ref Range   Vit D, 25-Hydroxy 27 (L) 30 - 100 ng/mL    Comment: Vitamin D Status           25-OH Vitamin D        Deficiency                <20 ng/mL        Insufficiency         20 - 29 ng/mL        Optimal             > or = 30 ng/mL   For 25-OH Vitamin D testing on patients on D2-supplementation and patients for whom quantitation of D2 and D3 fractions is required, the QuestAssureD 25-OH VIT D, (D2,D3), LC/MS/MS is recommended: order code [id number] (patients > 2 yrs).   Vitamin B12     Status: None   Collection Time: 10/01/16 10:26 AM  Result Value Ref Range   Vitamin B-12 295 200 - 1,100 pg/mL  TSH     Status: None   Collection Time: 10/01/16 10:26 AM  Result Value Ref Range   TSH 0.77 0.40 - 4.50 mIU/L    Diabetic Foot Exam: Diabetic Foot Exam - Simple   Simple Foot Form Diabetic Foot exam was performed with the following findings:  Yes 10/15/2016 10:27 AM  Visual Inspection See comments:  Yes Sensation Testing Intact to touch and monofilament testing bilaterally:  Yes Pulse Check Posterior Tibialis and Dorsalis pulse intact bilaterally:  Yes Comments Some callus formation, and very short nails      PHQ2/9: Depression screen [facility] 2/9 10/15/2016 10/01/2016 07/02/2016 06/02/2016 02/27/2016  Decreased Interest 2 1 0 0 0  Down, Depressed, Hopeless 3 0 0 0 0  PHQ - 2 Score 5 1 0 0 0  Altered sleeping 0 - - - -  Tired, decreased energy 2 - - - -  Change in appetite 0 - - - -  Feeling bad or failure about yourself  0 - - - -  Trouble concentrating 0 - - - -  Moving slowly or fidgety/restless 0 - - - -  Suicidal thoughts 0 - - - -  PHQ-9 Score 7  - - - -  Difficult doing work/chores Somewhat difficult - - - -     Fall Risk: Fall Risk  10/15/2016 10/01/2016 07/02/2016 06/02/2016 02/27/2016  Falls in the past year? _0   Number falls in past yr: - - - - -  Injury with Fall? - - - - -  Risk for fall due to : - - - - -  Follow up - - - - -    Assessment & Plan  1. Dyslipidemia associated with type 2 diabetes mellitus (HCC)  - metFORMIN (GLUCOPHAGE-XR) 500 MG 24 hr tablet; Take 1 tablet (500 mg total) by mouth daily with breakfast.  Dispense: 30 tablet; Refill: 2  Discussed possible side effects of medications, also explained changing his diet may be enough, but he wants medication  2. Depression, major, recurrent, mild ([place])  He does not want to change his medication at this time

## 2016-10-20 ENCOUNTER — Telehealth: Payer: Self-pay | Admitting: Family Medicine

## 2016-10-20 NOTE — Telephone Encounter (Signed)
PER [place] MEDICAID WILL ONLY PAY FOR ACCU CHECK. WOULD YOU PLEASE SEND RX FOR ACCU CHECK WITH STRIPS TO PHARM.

## 2016-10-20 NOTE — Telephone Encounter (Signed)
PT IS NEEDING A METER AND STRIPS TO CHECK BLOOD SUGAR LEVELS THAT MEDICAID WILL COVER.

## 2016-10-21 ENCOUNTER — Other Ambulatory Visit: Payer: Self-pay | Admitting: Family Medicine

## 2016-10-21 MED ORDER — ACCU-CHEK AVIVA PLUS W/DEVICE KIT: 1.0000 "application " | PACK | Freq: Every day | 0 refills | Status: DC

## 2016-10-21 MED ORDER — ACCU-CHEK SOFT TOUCH LANCETS MISC: 12 refills | Status: DC

## 2016-10-21 MED ORDER — GLUCOSE BLOOD VI STRP: ORAL_STRIP | 12 refills | Status: DC

## 2016-10-21 NOTE — Progress Notes (Unsigned)
Please sign order for testing supplies.

## 2016-10-21 NOTE — Addendum Note (Signed)
Addended by: Docia Furl on: 10/21/2016 04:18 PM   Modules accepted: Orders

## 2016-11-09 ENCOUNTER — Ambulatory Visit (INDEPENDENT_AMBULATORY_CARE_PROVIDER_SITE_OTHER): Payer: Medicaid Other

## 2016-11-09 DIAGNOSIS — E538 Deficiency of other specified B group vitamins: Secondary | ICD-10-CM

## 2016-11-09 MED ORDER — CYANOCOBALAMIN 1000 MCG/ML IJ SOLN
1000.0000 ug | Freq: Once | INTRAMUSCULAR | Status: AC
  Administered 2016-11-09: 1000 ug via INTRAMUSCULAR

## 2016-12-16 ENCOUNTER — Ambulatory Visit (INDEPENDENT_AMBULATORY_CARE_PROVIDER_SITE_OTHER): Payer: Medicaid Other

## 2016-12-16 DIAGNOSIS — D519 Vitamin B12 deficiency anemia, unspecified: Secondary | ICD-10-CM

## 2016-12-16 MED ORDER — CYANOCOBALAMIN 1000 MCG/ML IJ SOLN
1000.0000 ug | Freq: Once | INTRAMUSCULAR | Status: AC
  Administered 2016-12-16: 1000 ug via INTRAMUSCULAR

## 2017-01-13 ENCOUNTER — Other Ambulatory Visit: Payer: Self-pay | Admitting: Family Medicine

## 2017-01-13 DIAGNOSIS — E785 Hyperlipidemia, unspecified: Principal | ICD-10-CM

## 2017-01-13 DIAGNOSIS — E1169 Type 2 diabetes mellitus with other specified complication: Secondary | ICD-10-CM

## 2017-01-18 ENCOUNTER — Ambulatory Visit (INDEPENDENT_AMBULATORY_CARE_PROVIDER_SITE_OTHER): Payer: Medicaid Other

## 2017-01-18 DIAGNOSIS — E538 Deficiency of other specified B group vitamins: Secondary | ICD-10-CM

## 2017-01-18 MED ORDER — CYANOCOBALAMIN 1000 MCG/ML IJ SOLN
1000.0000 ug | Freq: Once | INTRAMUSCULAR | Status: AC
  Administered 2017-01-18: 1000 ug via INTRAMUSCULAR

## 2017-02-01 ENCOUNTER — Encounter: Payer: Self-pay | Admitting: Family Medicine

## 2017-02-01 ENCOUNTER — Ambulatory Visit (INDEPENDENT_AMBULATORY_CARE_PROVIDER_SITE_OTHER): Payer: Medicaid Other | Admitting: Family Medicine

## 2017-02-01 VITALS — BP 128/64 | HR 73 | Temp 98.1°F | Resp 18 | Ht 69.0 in | Wt 224.1 lb

## 2017-02-01 DIAGNOSIS — R6882 Decreased libido: Secondary | ICD-10-CM

## 2017-02-01 DIAGNOSIS — I2510 Atherosclerotic heart disease of native coronary artery without angina pectoris: Secondary | ICD-10-CM

## 2017-02-01 DIAGNOSIS — G4733 Obstructive sleep apnea (adult) (pediatric): Secondary | ICD-10-CM | POA: Diagnosis not present

## 2017-02-01 DIAGNOSIS — I10 Essential (primary) hypertension: Secondary | ICD-10-CM | POA: Diagnosis not present

## 2017-02-01 DIAGNOSIS — E785 Hyperlipidemia, unspecified: Secondary | ICD-10-CM | POA: Diagnosis not present

## 2017-02-01 DIAGNOSIS — K219 Gastro-esophageal reflux disease without esophagitis: Secondary | ICD-10-CM

## 2017-02-01 DIAGNOSIS — E538 Deficiency of other specified B group vitamins: Secondary | ICD-10-CM | POA: Diagnosis not present

## 2017-02-01 DIAGNOSIS — G44221 Chronic tension-type headache, intractable: Secondary | ICD-10-CM

## 2017-02-01 DIAGNOSIS — E782 Mixed hyperlipidemia: Secondary | ICD-10-CM | POA: Diagnosis not present

## 2017-02-01 DIAGNOSIS — E1169 Type 2 diabetes mellitus with other specified complication: Secondary | ICD-10-CM

## 2017-02-01 DIAGNOSIS — I200 Unstable angina: Secondary | ICD-10-CM

## 2017-02-01 DIAGNOSIS — F330 Major depressive disorder, recurrent, mild: Secondary | ICD-10-CM | POA: Diagnosis not present

## 2017-02-01 LAB — POCT UA - MICROALBUMIN: MICROALBUMIN (UR) POC: NEGATIVE mg/L

## 2017-02-01 LAB — POCT GLYCOSYLATED HEMOGLOBIN (HGB A1C): Hemoglobin A1C: 6.7

## 2017-02-01 MED ORDER — LOSARTAN POTASSIUM-HCTZ 100-25 MG PO TABS: 1.0000 | ORAL_TABLET | Freq: Every day | ORAL | 3 refills | Status: DC

## 2017-02-01 MED ORDER — ATORVASTATIN CALCIUM 40 MG PO TABS: 40.0000 mg | ORAL_TABLET | Freq: Every day | ORAL | 3 refills | Status: DC

## 2017-02-01 MED ORDER — OMEPRAZOLE 20 MG PO TBEC: DELAYED_RELEASE_TABLET | ORAL | 3 refills | Status: DC

## 2017-02-01 MED ORDER — BUPROPION HCL ER (XL) 150 MG PO TB24: 450.0000 mg | ORAL_TABLET | Freq: Every day | ORAL | 3 refills | Status: DC

## 2017-02-01 MED ORDER — METFORMIN HCL ER 500 MG PO TB24: 500.0000 mg | ORAL_TABLET | Freq: Every day | ORAL | 3 refills | Status: DC

## 2017-02-01 MED ORDER — AMLODIPINE BESYLATE 2.5 MG PO TABS: 2.5000 mg | ORAL_TABLET | Freq: Every day | ORAL | 3 refills | Status: DC

## 2017-02-01 MED ORDER — BUTALBITAL-ASPIRIN-CAFFEINE 50-325-40 MG PO CAPS: 1.0000 | ORAL_CAPSULE | Freq: Four times a day (QID) | ORAL | 0 refills | Status: DC | PRN

## 2017-02-01 MED ORDER — ESCITALOPRAM OXALATE 20 MG PO TABS: 20.0000 mg | ORAL_TABLET | Freq: Every day | ORAL | 3 refills | Status: DC

## 2017-02-01 NOTE — Progress Notes (Signed)
Name: Jimmy Green   MRN: [id number]    DOB: 1951/01/10   Date:02/01/2017       Progress Note  Subjective  Chief Complaint  Chief Complaint  Patient presents with  . Diabetes    4 month follow up avg 130 88 low 256 high  . Depression    HPI  Depression Major: he states he is ready for the weather to improve so he can go outside. 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 His PHQ 9 has gotten worse.  Dyslipidemia with type II DM: he denies polyphagia, polydipsia or polyuria. His hgbA1C was 6.7% diagnosed March 2018 Explained importance of life style modification and diabetic class ( he refuses ), exercise, aspirin daily, ARB, improving HDL, and to continue statin therapy. Advised yearly eye exam - we will schedule it for him. Fsbs at home average 130 but it can go from 88-256, hgbA1C is unchanged, discussed other options of therapy but he wants to hold off.   HTN: bp is at goal , she has been taking medication daily. He has some SOB but no recent chest pain with activity, no  palpitation. No side effects of medication. BP at local pharmacy and is around 130's/80's  Hyperlipidemia: taking Atorvastatin and denies side effects. No myalgia.    OSA: he got a CPAP machine that he got from a friend and was wearing 4 times weekly, but not over the past 7 months because the mask was not fitting properly. He still has a headache in am's, but no longer snoring since he raised the head of his bed. Dr. Ubaldo Glassing recommended repeat sleep study but he refused also.   History of basal cell carcinoma: Scientist, clinical (histocompatibility and immunogenetics) at [employer].   CAD: taking aspirin, statin and beta blocker and ARB,and he has NTG in his pocket. No chest pain or decrease in exercise tolerance. He was seen by Dr. Ubaldo Glassing in 04/2016, and had another heart cath that showed no significant disease, 55-70% stenosis on the LAD , on medical management, metoprolol, losartan, aspirin, he has not used a  NTG over the past few months.  Vertigo: symptoms started in Nov 2016. He had one episode of syncope 07/05/2015 went to [facility], head CT negative He states he has been having vertigo daily, triggered by head movement. Sometimes it washard for him to walk in a straight line. He states no longer happens when he rolls in his bed, and episodes are very seldom now. He has also noticed some hearing loss - that has resolved, and also has noticed some tinnitus on the right ear that now also resolved ( he was evaluated by ENT and advised to have evaluation for hearing aid and MRI with and without contrast was negative ). Symptoms have resolved.  Left hip pain: he took prednisone taper back Fall 2017 and pain on left hip resolved, no longer radiating to knee, and has good rom. No longer limping. X-ray only showed mild OA , likely bursitis versus sciatica since it now radiates down to left lateral calf. Seen Dr. Rudene Christians and he is contemplating left hip replacement     Patient Active Problem List   Diagnosis Date Noted  . Dyslipidemia associated with type 2 diabetes mellitus ([place]) 10/04/2016  . Trigger thumb of both thumbs 06/02/2016  . Chronic tension-type headache, intractable 09/25/2015  . Benign paroxysmal positional vertigo due to bilateral vestibular disorder 09/25/2015  . Hearing loss sensory, bilateral 08/26/2015  . Cerebral microvascular disease  08/26/2015  . Metabolic syndrome 03/55/9741  . Vertigo 06/21/2015  . Buzzing in ear 06/21/2015  . Hypertension goal BP (blood pressure) < 140/90 03/28/2015  . GERD (gastroesophageal reflux disease) 03/28/2015  . Hyperlipidemia 03/28/2015  . Depression, major 03/28/2015  . CAD in native artery 03/28/2015  . Actinic keratosis 02/14/2015  . Obstructive sleep apnea 02/03/2015  . Diverticulosis of colon without hemorrhage 01/31/2015  . Hx of colonic polyps   . Benign neoplasm of descending colon   . Benign neoplasm of sigmoid colon   . Idiopathic colitis    . Sebaceous cyst 09/14/2014    Past Surgical History:  Procedure Laterality Date  . BACK SURGERY    . BASAL CELL CARCINOMA EXCISION Left 12/02/2015   Chest-Done by Dermatologist   . CARDIAC CATHETERIZATION Left 04/01/2016   Procedure: Left Heart Cath and Coronary Angiography;  Surgeon: Teodoro Spray, MD;  Location: [place] [facility];  Service: Cardiovascular;  Laterality: Left;  . CARDIAC CATHETERIZATION N/A 04/01/2016   Procedure: Intravascular Pressure Wire/FFR Study;  Surgeon: Isaias Cowman, MD;  Location: [place] [facility];  Service: Cardiovascular;  Laterality: N/A;  . COLONOSCOPY N/A 01/15/2015   Wohl-ileitis, 2 benign polyps, cryptitis, sigmoid diverticulosis, focal ulceration ICV  . CORONARY STENT PLACEMENT    . FRACTIONAL FLOW RESERVE WIRE  10/08/2011   Procedure: FRACTIONAL FLOW RESERVE WIRE;  Surgeon: Clent Demark, MD;  Location[facility];  Service: Cardiovascular;;  . HERNIA REPAIR    . KNEE SURGERY    . LEFT HEART CATHETERIZATION WITH CORONARY ANGIOGRAM N/A 10/08/2011   Procedure: LEFT HEART CATHETERIZATION WITH CORONARY ANGIOGRAM;  Surgeon: Clent Demark, MD;  Location: [place] CATH LAB;  Service: Cardiovascular;  Laterality: N/A;    Family History  Problem Relation Age of Onset  . Lung cancer Father   . Heart disease Father   . Skin cancer Father     Social History   Social History  . Marital status: Married    Spouse name: N/A  . Number of children: N/A  . Years of education: N/A   Occupational History  . Not on file.   Social History Main Topics  . Smoking status: Former Smoker    Years: 40.00    Types: Cigarettes    Quit date: 08/03/2012  . Smokeless tobacco: Never Used  . Alcohol use No  . Drug use: No  . Sexual activity: Yes    Partners: Female   Other Topics Concern  . Not on file   Social History Narrative  . No narrative on file     Current Outpatient Prescriptions:  .  ADVAIR DISKUS 250-50 MCG/DOSE AEPB, , Disp: , Rfl: 0 .   amLODipine (NORVASC) 2.5 MG tablet, Take 1 tablet (2.5 mg total) by mouth daily., Disp: 30 tablet, Rfl: 3 .  aspirin EC 81 MG tablet, Take 81 mg by mouth every morning. , Disp: , Rfl:  .  atorvastatin (LIPITOR) 40 MG tablet, Take 1 tablet (40 mg total) by mouth daily., Disp: 30 tablet, Rfl: 3 .  Blood Glucose Monitoring Suppl (ACCU-CHEK AVIVA PLUS) w/Device KIT, 1 application by Does not apply route daily., Disp: 1 kit, Rfl: 0 .  buPROPion (WELLBUTRIN XL) 150 MG 24 hr tablet, Take 3 tablets (450 mg total) by mouth daily., Disp: 90 tablet, Rfl: 3 .  butalbital-aspirin-caffeine (FIORINAL) 50-325-40 MG capsule, Take 1 capsule by mouth every 6 (six) hours as needed for headache., Disp: 30 capsule, Rfl: 0 .  escitalopram (LEXAPRO)  20 MG tablet, Take 1 tablet (20 mg total) by mouth daily., Disp: 30 tablet, Rfl: 3 .  glucose blood (ACCU-CHEK ACTIVE STRIPS) test strip, Use as instructed, Disp: 100 each, Rfl: 12 .  ibuprofen (ADVIL,MOTRIN) 800 MG tablet, Take 1 tablet (800 mg total) by mouth every 8 (eight) hours as needed., Disp: 60 tablet, Rfl: 0 .  Lancets (ACCU-CHEK SOFT TOUCH) lancets, Use as instructed, Disp: 100 each, Rfl: 12 .  losartan-hydrochlorothiazide (HYZAAR) 100-25 MG tablet, Take 1 tablet by mouth daily., Disp: 30 tablet, Rfl: 3 .  Melatonin 1 MG TABS, Take 1 tablet (1 mg total) by mouth every evening., Disp: 30 tablet, Rfl: 0 .  metFORMIN (GLUCOPHAGE-XR) 500 MG 24 hr tablet, Take 1 tablet (500 mg total) by mouth daily with breakfast., Disp: 30 tablet, Rfl: 3 .  metoprolol succinate (TOPROL-XL) 50 MG 24 hr tablet, Take 1 tablet (50 mg total) by mouth every evening., Disp: 30 tablet, Rfl: 3 .  nitroGLYCERIN (NITROSTAT) 0.4 MG SL tablet, Place 1 tablet (0.4 mg total) under the tongue every 5 (five) minutes as needed. For chest pain., Disp: 30 tablet, Rfl: 0 .  Omeprazole 20 MG TBEC, TAKE 1 TABLET (20 MG TOTAL) BY MOUTH DAILY., Disp: 30 tablet, Rfl: 3  Allergies  Allergen Reactions  . Elavil  [Amitriptyline Hcl] Rash     ROS  Constitutional: Negative for fever or weight change.  Respiratory: Negative for cough and shortness of breath.   Cardiovascular: Negative for chest pain or palpitations.  Gastrointestinal: Negative for abdominal pain, no bowel changes.  Musculoskeletal: Positive for gait problem but no joint swelling.  Skin: Negative for rash.  Neurological: Negative for dizziness or headache.  No other specific complaints in a complete review of systems (except as listed in HPI above).  Objective  Vitals:   02/01/17 0828  BP: 128/64  Pulse: 73  Resp: 18  Temp: 98.1 F (36.7 C)  SpO2: 97%  Weight: 224 lb 2 oz (101.7 kg)  Height: '5\' 9"'  (1.753 m)    Body mass index is 33.1 kg/m.  Physical Exam  Constitutional: Patient appears well-developed and well-nourished. Obese  No distress.  HEENT: head atraumatic, normocephalic, pupils equal and reactive to light,neck supple, throat within normal limits Cardiovascular: Normal rate, regular rhythm and normal heart sounds.  No murmur heard. No BLE edema. Pulmonary/Chest: Effort normal and breath sounds normal. No respiratory distress. Abdominal: Soft.  There is no tenderness. Psychiatric: Patient has a normal mood and affect. behavior is normal. Judgment and thought content normal. Muscular Skeletal: decrease rom of left hip, with pain radiating to groin during internal rotation   Recent Results (from the past 2160 hour(s))  POCT HgB A1C     Status: Abnormal   Collection Time: 02/01/17  8:38 AM  Result Value Ref Range   Hemoglobin A1C 6.7   POCT UA - Microalbumin     Status: Normal   Collection Time: 02/01/17  9:42 AM  Result Value Ref Range   Microalbumin Ur, POC neg mg/L   Creatinine, POC  mg/dL   Albumin/Creatinine Ratio, Urine, POC       PHQ2/9: Depression screen [facility] 2/9 10/15/2016 10/01/2016 07/02/2016 06/02/2016 02/27/2016  Decreased Interest 2 1 0 0 0  Down, Depressed, Hopeless 3 0 0 0 0  PHQ - 2  Score 5 1 0 0 0  Altered sleeping 0 - - - -  Tired, decreased energy 2 - - - -  Change in appetite 0 - - - -  Feeling bad or failure about yourself  0 - - - -  Trouble concentrating 0 - - - -  Moving slowly or fidgety/restless 0 - - - -  Suicidal thoughts 0 - - - -  PHQ-9 Score 7 - - - -  Difficult doing work/chores Somewhat difficult - - - -     Fall Risk: Fall Risk  10/15/2016 10/01/2016 07/02/2016 06/02/2016 02/27/2016  Falls in the past year? No No No No No  Number falls in past yr: - - - - -  Injury with Fall? - - - - -  Risk for fall due to : - - - - -  Follow up - - - - -      Assessment & Plan  1. Dyslipidemia associated with type 2 diabetes mellitus ([place])  At goal, needs eye exam  - POCT HgB A1C - metFORMIN (GLUCOPHAGE-XR) 500 MG 24 hr tablet; Take 1 tablet (500 mg total) by mouth daily with breakfast.  Dispense: 30 tablet; Refill: 3 - POCT UA - Microalbumin  2. Vitamin B 12 deficiency  Continue supplementation   3. Depression, major, recurrent, mild ([place])  Not doing as well, getting more snappy, but on highest dose of current medications, but does not want to change medications or add anything else at this time. Discussed therapy, but he also refused - escitalopram (LEXAPRO) 20 MG tablet; Take 1 tablet (20 mg total) by mouth daily.  Dispense: 30 tablet; Refill: 3 - buPROPion (WELLBUTRIN XL) 150 MG 24 hr tablet; Take 3 tablets (450 mg total) by mouth daily.  Dispense: 90 tablet; Refill: 3  4. Hypertension, benign  - losartan-hydrochlorothiazide (HYZAAR) 100-25 MG tablet; Take 1 tablet by mouth daily.  Dispense: 30 tablet; Refill: 3 - amLODipine (NORVASC) 2.5 MG tablet; Take 1 tablet (2.5 mg total) by mouth daily.  Dispense: 30 tablet; Refill: 3  5. Obstructive sleep apnea  He used to have CPAP machine, but could not tolerate, however no longer snoring when he raised the head of the bed. He refuses another sleep study   6. CAD in native artery  Stable at this  time  7. Gastroesophageal reflux disease without esophagitis  - Omeprazole 20 MG TBEC; TAKE 1 TABLET (20 MG TOTAL) BY MOUTH DAILY.  Dispense: 30 tablet; Refill: 3  8. Unstable angina (HCC)  Doing well this time  9. Mixed hyperlipidemia  - atorvastatin (LIPITOR) 40 MG tablet; Take 1 tablet (40 mg total) by mouth daily.  Dispense: 30 tablet; Refill: 3  10. Chronic tension-type headache, intractable  - butalbital-aspirin-caffeine (FIORINAL) 50-325-40 MG capsule; Take 1 capsule by mouth every 6 (six) hours as needed for headache.  Dispense: 30 capsule; Refill: 0  11. Low libido  Discussed side effects of testosterone replacement and he prefers not getting labs done at this time

## 2017-02-15 ENCOUNTER — Ambulatory Visit (INDEPENDENT_AMBULATORY_CARE_PROVIDER_SITE_OTHER): Payer: Medicaid Other

## 2017-02-15 DIAGNOSIS — D518 Other vitamin B12 deficiency anemias: Secondary | ICD-10-CM | POA: Diagnosis not present

## 2017-02-15 MED ORDER — CYANOCOBALAMIN 1000 MCG/ML IJ SOLN
1000.0000 ug | Freq: Once | INTRAMUSCULAR | Status: AC
Start: 2017-02-15 — End: 2017-02-15
  Administered 2017-02-15: 1000 ug via INTRAMUSCULAR

## 2017-03-09 ENCOUNTER — Other Ambulatory Visit: Payer: Self-pay | Admitting: Family Medicine

## 2017-03-09 DIAGNOSIS — I2510 Atherosclerotic heart disease of native coronary artery without angina pectoris: Secondary | ICD-10-CM

## 2017-03-09 DIAGNOSIS — I10 Essential (primary) hypertension: Secondary | ICD-10-CM

## 2017-03-19 ENCOUNTER — Ambulatory Visit (INDEPENDENT_AMBULATORY_CARE_PROVIDER_SITE_OTHER): Payer: Medicare Other

## 2017-03-19 ENCOUNTER — Other Ambulatory Visit: Payer: Self-pay | Admitting: Family Medicine

## 2017-03-19 DIAGNOSIS — D518 Other vitamin B12 deficiency anemias: Secondary | ICD-10-CM

## 2017-03-19 DIAGNOSIS — K219 Gastro-esophageal reflux disease without esophagitis: Secondary | ICD-10-CM

## 2017-03-19 MED ORDER — CYANOCOBALAMIN 1000 MCG/ML IJ SOLN
1000.0000 ug | Freq: Once | INTRAMUSCULAR | Status: AC
  Administered 2017-03-19: 1000 ug via INTRAMUSCULAR

## 2017-03-19 NOTE — Telephone Encounter (Signed)
Patient requesting refill of Omeprazole to [facility].

## 2017-03-22 DIAGNOSIS — E119 Type 2 diabetes mellitus without complications: Secondary | ICD-10-CM | POA: Diagnosis not present

## 2017-03-22 LAB — HM DIABETES EYE EXAM

## 2017-03-24 ENCOUNTER — Encounter: Payer: Self-pay | Admitting: Family Medicine

## 2017-03-24 ENCOUNTER — Other Ambulatory Visit: Payer: Self-pay | Admitting: Family Medicine

## 2017-03-26 DIAGNOSIS — M1612 Unilateral primary osteoarthritis, left hip: Secondary | ICD-10-CM | POA: Diagnosis not present

## 2017-03-31 ENCOUNTER — Encounter
Admission: RE | Admit: 2017-03-31 | Discharge: 2017-03-31 | Disposition: A | Discharge status: Home / Self Care | Payer: Medicare Other | Source: Ambulatory Visit | Attending: Orthopedic Surgery | Admitting: Orthopedic Surgery

## 2017-03-31 DIAGNOSIS — R9431 Abnormal electrocardiogram [ECG] [EKG]: Secondary | ICD-10-CM | POA: Diagnosis not present

## 2017-03-31 DIAGNOSIS — R42 Dizziness and giddiness: Secondary | ICD-10-CM | POA: Insufficient documentation

## 2017-03-31 DIAGNOSIS — G4733 Obstructive sleep apnea (adult) (pediatric): Secondary | ICD-10-CM | POA: Diagnosis not present

## 2017-03-31 DIAGNOSIS — Z01810 Encounter for preprocedural cardiovascular examination: Secondary | ICD-10-CM | POA: Diagnosis not present

## 2017-03-31 DIAGNOSIS — E1169 Type 2 diabetes mellitus with other specified complication: Secondary | ICD-10-CM | POA: Insufficient documentation

## 2017-03-31 DIAGNOSIS — Z01812 Encounter for preprocedural laboratory examination: Secondary | ICD-10-CM | POA: Diagnosis not present

## 2017-03-31 DIAGNOSIS — I252 Old myocardial infarction: Secondary | ICD-10-CM | POA: Diagnosis not present

## 2017-03-31 DIAGNOSIS — I679 Cerebrovascular disease, unspecified: Secondary | ICD-10-CM | POA: Insufficient documentation

## 2017-03-31 DIAGNOSIS — D125 Benign neoplasm of sigmoid colon: Secondary | ICD-10-CM | POA: Diagnosis not present

## 2017-03-31 DIAGNOSIS — L723 Sebaceous cyst: Secondary | ICD-10-CM | POA: Diagnosis not present

## 2017-03-31 DIAGNOSIS — E785 Hyperlipidemia, unspecified: Secondary | ICD-10-CM | POA: Diagnosis not present

## 2017-03-31 DIAGNOSIS — D124 Benign neoplasm of descending colon: Secondary | ICD-10-CM | POA: Diagnosis not present

## 2017-03-31 DIAGNOSIS — I10 Essential (primary) hypertension: Secondary | ICD-10-CM | POA: Insufficient documentation

## 2017-03-31 DIAGNOSIS — K5730 Diverticulosis of large intestine without perforation or abscess without bleeding: Secondary | ICD-10-CM | POA: Insufficient documentation

## 2017-03-31 HISTORY — DX: Orthopnea: R06.01

## 2017-03-31 HISTORY — DX: Acute myocardial infarction, unspecified: I21.9

## 2017-03-31 HISTORY — DX: Angina pectoris, unspecified: I20.9

## 2017-03-31 HISTORY — DX: Type 2 diabetes mellitus without complications: E11.9

## 2017-03-31 LAB — URINALYSIS, COMPLETE (UACMP) WITH MICROSCOPIC
BILIRUBIN URINE: NEGATIVE
Bacteria, UA: NONE SEEN
GLUCOSE, UA: NEGATIVE mg/dL
HGB URINE DIPSTICK: NEGATIVE
KETONES UR: NEGATIVE mg/dL
LEUKOCYTES UA: NEGATIVE
NITRITE: NEGATIVE
PROTEIN: NEGATIVE mg/dL
RBC / HPF: NONE SEEN RBC/hpf (ref 0–5)
Specific Gravity, Urine: 1.008 (ref 1.005–1.030)
Squamous Epithelial / LPF: NONE SEEN
pH: 8 (ref 5.0–8.0)

## 2017-03-31 LAB — TYPE AND SCREEN
ABO/RH(D): O POS
ANTIBODY SCREEN: NEGATIVE

## 2017-03-31 LAB — BASIC METABOLIC PANEL
ANION GAP: 7 (ref 5–15)
BUN: 10 mg/dL (ref 6–20)
CHLORIDE: 103 mmol/L (ref 101–111)
CO2: 28 mmol/L (ref 22–32)
Calcium: 9.7 mg/dL (ref 8.9–10.3)
Creatinine, Ser: 0.68 mg/dL (ref 0.61–1.24)
GFR calc Af Amer: >60 mL/min (ref >60)
GFR calc non Af Amer: >60 mL/min (ref >60)
GLUCOSE: 117 mg/dL — AB (ref 65–99)
POTASSIUM: 3.4 mmol/L — AB (ref 3.5–5.1)
Sodium: 138 mmol/L (ref 135–145)

## 2017-03-31 LAB — SEDIMENTATION RATE: SED RATE: 6 mm/h (ref 0–20)

## 2017-03-31 LAB — CBC
HCT: 45.6 % (ref 40.0–52.0)
HEMOGLOBIN: 15.6 g/dL (ref 13.0–18.0)
MCH: 31.3 pg (ref 26.0–34.0)
MCHC: 34.2 g/dL (ref 32.0–36.0)
MCV: 91.4 fL (ref 80.0–100.0)
Platelets: 295 10*3/uL (ref 150–440)
RBC: 4.99 MIL/uL (ref 4.40–5.90)
RDW: 13.7 % (ref 11.5–14.5)
WBC: 8.2 10*3/uL (ref 3.8–10.6)

## 2017-03-31 LAB — PROTIME-INR
INR: 1.15
Prothrombin Time: 14.6 seconds (ref 11.4–15.2)

## 2017-03-31 LAB — SURGICAL PCR SCREEN
MRSA, PCR: POSITIVE — AB
Staphylococcus aureus: POSITIVE — AB

## 2017-03-31 LAB — APTT: APTT: 35 s (ref 24–36)

## 2017-03-31 NOTE — Patient Instructions (Signed)
Your procedure is scheduled on: 04/06/17 Tues Report to Same Day Surgery 2nd floor medical mall [address] Entrance-take elevator on left to 2nd floor.  Check in with surgery information desk.) To find out your arrival time please call [phone] between 1PM - 3PM on 04/05/17 Mon  Remember: Instructions that are not followed completely may result in serious medical risk, up to and including death, or upon the discretion of your surgeon and anesthesiologist your surgery may need to be rescheduled.    _x___ 1. Do not eat food after midnight the night before your procedure. You may drink clear liquids up to 2 hours before you are scheduled to arrive at the hospital for your procedure.  Do not drink clear liquids within 2 hours of your scheduled arrival to the hospital.  Clear liquids include  --Water or Apple juice without pulp  --Clear carbohydrate beverage such as ClearFast or Gatorade  --Black Coffee or Clear Tea (No milk, no creamers, do not add anything to                  the coffee or Tea  No gum chewing or hard candies.     __x__ 2. No Alcohol for 24 hours before or after surgery.   __x__3. No Smoking for 24 prior to surgery.   ____  4. Bring all medications with you on the day of surgery if instructed.    __x__ 5. Notify your doctor if there is any change in your medical condition     (cold, fever, infections).     Do not wear jewelry, make-up, hairpins, clips or nail polish.  Do not wear lotions, powders, or perfumes. You may wear deodorant.  Do not shave 48 hours prior to surgery. Men may shave face and neck.  Do not bring valuables to the hospital.    [facility] is not responsible for any belongings or valuables.               Contacts, dentures or bridgework may not be worn into surgery.  Leave your suitcase in the car. After surgery it may be brought to your room.  For patients admitted to the hospital, discharge time is determined by your                        treatment team.   Patients discharged the day of surgery will not be allowed to drive home.  You will need someone to drive you home and stay with you the night of your procedure.    Please read over the following fact sheets that you were given:   [facility] Preparing for Surgery and or MRSA Information   _x___ Take anti-hypertensive (unless it includes a diuretic), cardiac, seizure, asthma,     anti-reflux and psychiatric medicines. These include:  1. amLODipine (NORVASC)  2.atorvastatin (LIPITOR)   3. escitalopram (LEXAPRO)  4.metoprolol succinate   5.Omeprazole 20 MG   6.  ____Fleets enema or Magnesium Citrate as directed.   _x___ Use CHG Soap or sage wipes as directed on instruction sheet   ____ Use inhalers on the day of surgery and bring to hospital day of surgery  _x___ Stop Metformin and Janumet 2 days prior to surgery.    ____ Take 1/2 of usual insulin dose the night before surgery and none on the morning     surgery.   _x___ Follow recommendations from Cardiologist, Pulmonologist or PCP regarding  stopping Aspirin, Coumadin, Pllavix ,Eliquis, Effient, or Pradaxa, and Pletal.  X____Stop Anti-inflammatories such as Advil, Aleve, Ibuprofen, Motrin, Naproxen, Naprosyn, Goodies powders or aspirin products. OK to take Tylenol and                          Celebrex.   _x___ Stop supplements until after surgery.  But may continue Vitamin D, Vitamin B,       and multivitamin.   ____ Bring C-Pap to the hospital.

## 2017-04-01 LAB — URINE CULTURE: CULTURE: NO GROWTH

## 2017-04-05 MED ORDER — CEFAZOLIN SODIUM-DEXTROSE 2-4 GM/100ML-% IV SOLN
2.0000 g | Freq: Once | INTRAVENOUS | Status: AC
  Administered 2017-04-06: 2 g via INTRAVENOUS

## 2017-04-05 MED ORDER — TRANEXAMIC ACID 1000 MG/10ML IV SOLN
1000.0000 mg | INTRAVENOUS | Status: DC
  Filled 2017-04-05: qty 10

## 2017-04-06 ENCOUNTER — Inpatient Hospital Stay: Payer: Medicare Other

## 2017-04-06 ENCOUNTER — Inpatient Hospital Stay
Admission: RE | Admit: 2017-04-06 | Discharge: 2017-04-09 | DRG: 470 | Disposition: A | Discharge status: Skilled Nursing Facility | Payer: Medicare Other | Source: Ambulatory Visit | Attending: Orthopedic Surgery | Admitting: Orthopedic Surgery

## 2017-04-06 ENCOUNTER — Inpatient Hospital Stay: Payer: Medicare Other | Admitting: Anesthesiology

## 2017-04-06 ENCOUNTER — Encounter: Payer: Self-pay | Admitting: *Deleted

## 2017-04-06 ENCOUNTER — Encounter
Admission: RE | Disposition: A | Discharge status: Skilled Nursing Facility | Payer: Self-pay | Source: Ambulatory Visit | Attending: Orthopedic Surgery

## 2017-04-06 DIAGNOSIS — Z79899 Other long term (current) drug therapy: Secondary | ICD-10-CM

## 2017-04-06 DIAGNOSIS — Z23 Encounter for immunization: Secondary | ICD-10-CM

## 2017-04-06 DIAGNOSIS — G4730 Sleep apnea, unspecified: Secondary | ICD-10-CM | POA: Diagnosis present

## 2017-04-06 DIAGNOSIS — I10 Essential (primary) hypertension: Secondary | ICD-10-CM | POA: Diagnosis present

## 2017-04-06 DIAGNOSIS — M1612 Unilateral primary osteoarthritis, left hip: Secondary | ICD-10-CM | POA: Diagnosis present

## 2017-04-06 DIAGNOSIS — G8918 Other acute postprocedural pain: Secondary | ICD-10-CM

## 2017-04-06 DIAGNOSIS — Z96642 Presence of left artificial hip joint: Secondary | ICD-10-CM | POA: Diagnosis not present

## 2017-04-06 DIAGNOSIS — Z7401 Bed confinement status: Secondary | ICD-10-CM | POA: Diagnosis not present

## 2017-04-06 DIAGNOSIS — E876 Hypokalemia: Secondary | ICD-10-CM | POA: Diagnosis present

## 2017-04-06 DIAGNOSIS — D62 Acute posthemorrhagic anemia: Secondary | ICD-10-CM | POA: Diagnosis not present

## 2017-04-06 DIAGNOSIS — M6281 Muscle weakness (generalized): Secondary | ICD-10-CM | POA: Diagnosis not present

## 2017-04-06 DIAGNOSIS — Z741 Need for assistance with personal care: Secondary | ICD-10-CM | POA: Diagnosis not present

## 2017-04-06 DIAGNOSIS — Z9889 Other specified postprocedural states: Secondary | ICD-10-CM

## 2017-04-06 DIAGNOSIS — Z820 Family history of epilepsy and other diseases of the nervous system: Secondary | ICD-10-CM

## 2017-04-06 DIAGNOSIS — I2510 Atherosclerotic heart disease of native coronary artery without angina pectoris: Secondary | ICD-10-CM | POA: Diagnosis present

## 2017-04-06 DIAGNOSIS — Z85828 Personal history of other malignant neoplasm of skin: Secondary | ICD-10-CM

## 2017-04-06 DIAGNOSIS — K219 Gastro-esophageal reflux disease without esophagitis: Secondary | ICD-10-CM | POA: Diagnosis present

## 2017-04-06 DIAGNOSIS — R2689 Other abnormalities of gait and mobility: Secondary | ICD-10-CM | POA: Diagnosis not present

## 2017-04-06 DIAGNOSIS — Z7982 Long term (current) use of aspirin: Secondary | ICD-10-CM | POA: Diagnosis not present

## 2017-04-06 DIAGNOSIS — Z809 Family history of malignant neoplasm, unspecified: Secondary | ICD-10-CM | POA: Diagnosis not present

## 2017-04-06 DIAGNOSIS — Z471 Aftercare following joint replacement surgery: Secondary | ICD-10-CM | POA: Diagnosis not present

## 2017-04-06 DIAGNOSIS — I252 Old myocardial infarction: Secondary | ICD-10-CM

## 2017-04-06 DIAGNOSIS — E119 Type 2 diabetes mellitus without complications: Secondary | ICD-10-CM | POA: Diagnosis present

## 2017-04-06 DIAGNOSIS — F329 Major depressive disorder, single episode, unspecified: Secondary | ICD-10-CM | POA: Diagnosis not present

## 2017-04-06 DIAGNOSIS — M25559 Pain in unspecified hip: Secondary | ICD-10-CM | POA: Diagnosis not present

## 2017-04-06 DIAGNOSIS — Z7984 Long term (current) use of oral hypoglycemic drugs: Secondary | ICD-10-CM

## 2017-04-06 DIAGNOSIS — Z419 Encounter for procedure for purposes other than remedying health state, unspecified: Secondary | ICD-10-CM

## 2017-04-06 HISTORY — PX: TOTAL HIP ARTHROPLASTY: SHX124

## 2017-04-06 LAB — CREATININE, SERUM
Creatinine, Ser: 0.64 mg/dL (ref 0.61–1.24)
GFR calc non Af Amer: >60 mL/min (ref >60)

## 2017-04-06 LAB — GLUCOSE, CAPILLARY
GLUCOSE-CAPILLARY: 130 mg/dL — AB (ref 65–99)
Glucose-Capillary: 104 mg/dL — ABNORMAL HIGH (ref 65–99)
Glucose-Capillary: 129 mg/dL — ABNORMAL HIGH (ref 65–99)
Glucose-Capillary: 153 mg/dL — ABNORMAL HIGH (ref 65–99)

## 2017-04-06 LAB — ABO/RH: ABO/RH(D): O POS

## 2017-04-06 LAB — CBC
HEMATOCRIT: 38.3 % — AB (ref 40.0–52.0)
Hemoglobin: 13.2 g/dL (ref 13.0–18.0)
MCH: 31.7 pg (ref 26.0–34.0)
MCHC: 34.4 g/dL (ref 32.0–36.0)
MCV: 92.1 fL (ref 80.0–100.0)
Platelets: 223 10*3/uL (ref 150–440)
RBC: 4.16 MIL/uL — ABNORMAL LOW (ref 4.40–5.90)
RDW: 13.7 % (ref 11.5–14.5)
WBC: 16.1 10*3/uL — ABNORMAL HIGH (ref 3.8–10.6)

## 2017-04-06 SURGERY — ARTHROPLASTY, HIP, TOTAL, ANTERIOR APPROACH
Anesthesia: Spinal | Site: Knee | Laterality: Left | Wound class: Clean

## 2017-04-06 MED ORDER — DIPHENHYDRAMINE HCL 12.5 MG/5ML PO ELIX: 12.5000 mg | ORAL_SOLUTION | ORAL | Status: DC | PRN

## 2017-04-06 MED ORDER — BUPIVACAINE HCL (PF) 0.5 % IJ SOLN
INTRAMUSCULAR | Status: DC | PRN
  Administered 2017-04-06: 3.2 mL

## 2017-04-06 MED ORDER — ACETAMINOPHEN 325 MG PO TABS
ORAL_TABLET | ORAL | Status: AC
  Administered 2017-04-06: 650 mg via ORAL
  Filled 2017-04-06: qty 2

## 2017-04-06 MED ORDER — INSULIN ASPART 100 UNIT/ML ~~LOC~~ SOLN
0.0000 [IU] | Freq: Three times a day (TID) | SUBCUTANEOUS | Status: DC
  Administered 2017-04-06: 2 [IU] via SUBCUTANEOUS
  Filled 2017-04-06: qty 1

## 2017-04-06 MED ORDER — OXYCODONE HCL 5 MG PO TABS
5.0000 mg | ORAL_TABLET | ORAL | Status: DC | PRN
  Administered 2017-04-06: 5 mg via ORAL
  Administered 2017-04-06: 10 mg via ORAL
  Administered 2017-04-06: 5 mg via ORAL
  Administered 2017-04-07 – 2017-04-09 (×15): 10 mg via ORAL
  Filled 2017-04-06 (×5): qty 2
  Filled 2017-04-06: qty 1
  Filled 2017-04-06: qty 2
  Filled 2017-04-06: qty 1
  Filled 2017-04-06 (×10): qty 2

## 2017-04-06 MED ORDER — NEOMYCIN-POLYMYXIN B GU 40-200000 IR SOLN
Status: AC
  Filled 2017-04-06: qty 4

## 2017-04-06 MED ORDER — MAGNESIUM CITRATE PO SOLN
1.0000 | Freq: Once | ORAL | Status: DC | PRN
  Filled 2017-04-06 (×2): qty 296

## 2017-04-06 MED ORDER — MIDAZOLAM HCL 2 MG/2ML IJ SOLN
INTRAMUSCULAR | Status: AC
  Filled 2017-04-06: qty 2

## 2017-04-06 MED ORDER — ONDANSETRON HCL 4 MG/2ML IJ SOLN
INTRAMUSCULAR | Status: DC | PRN
  Administered 2017-04-06: 4 mg via INTRAVENOUS

## 2017-04-06 MED ORDER — SODIUM CHLORIDE 0.9 % IV SOLN
INTRAVENOUS | Status: DC
  Administered 2017-04-06 (×2): via INTRAVENOUS

## 2017-04-06 MED ORDER — MORPHINE SULFATE (PF) 2 MG/ML IV SOLN
2.0000 mg | INTRAVENOUS | Status: DC | PRN
  Administered 2017-04-06 – 2017-04-07 (×4): 2 mg via INTRAVENOUS
  Filled 2017-04-06 (×4): qty 1

## 2017-04-06 MED ORDER — NEOMYCIN-POLYMYXIN B GU 40-200000 IR SOLN
Status: DC | PRN
  Administered 2017-04-06: 4 mL

## 2017-04-06 MED ORDER — SODIUM CHLORIDE 0.9 % IV SOLN
INTRAVENOUS | Status: DC
  Administered 2017-04-06: 09:00:00 via INTRAVENOUS
  Administered 2017-04-06: 1000 mL via INTRAVENOUS

## 2017-04-06 MED ORDER — PROPOFOL 500 MG/50ML IV EMUL
INTRAVENOUS | Status: DC | PRN
  Administered 2017-04-06: 100 ug/kg/min via INTRAVENOUS

## 2017-04-06 MED ORDER — GLYCOPYRROLATE 0.2 MG/ML IJ SOLN
INTRAMUSCULAR | Status: DC | PRN
  Administered 2017-04-06: 0.2 mg via INTRAVENOUS

## 2017-04-06 MED ORDER — METOCLOPRAMIDE HCL 5 MG/ML IJ SOLN: 5.0000 mg | Freq: Three times a day (TID) | INTRAMUSCULAR | Status: DC | PRN

## 2017-04-06 MED ORDER — MIDAZOLAM HCL 5 MG/5ML IJ SOLN
INTRAMUSCULAR | Status: DC | PRN
  Administered 2017-04-06: 2 mg via INTRAVENOUS

## 2017-04-06 MED ORDER — ESCITALOPRAM OXALATE 20 MG PO TABS
20.0000 mg | ORAL_TABLET | Freq: Every day | ORAL | Status: DC
  Administered 2017-04-07 – 2017-04-09 (×3): 20 mg via ORAL
  Filled 2017-04-06 (×3): qty 1

## 2017-04-06 MED ORDER — SODIUM CHLORIDE 0.9 % IV SOLN
INTRAVENOUS | Status: DC | PRN
  Administered 2017-04-06: 30 ug/min via INTRAVENOUS

## 2017-04-06 MED ORDER — PROMETHAZINE HCL 25 MG/ML IJ SOLN
12.5000 mg | Freq: Once | INTRAMUSCULAR | Status: AC
  Administered 2017-04-06: 12.5 mg via INTRAVENOUS
  Filled 2017-04-06: qty 1

## 2017-04-06 MED ORDER — MENTHOL 3 MG MT LOZG
1.0000 | LOZENGE | OROMUCOSAL | Status: DC | PRN
  Filled 2017-04-06: qty 9

## 2017-04-06 MED ORDER — ONDANSETRON HCL 4 MG/2ML IJ SOLN: 4.0000 mg | Freq: Once | INTRAMUSCULAR | Status: DC | PRN

## 2017-04-06 MED ORDER — DOCUSATE SODIUM 100 MG PO CAPS
100.0000 mg | ORAL_CAPSULE | Freq: Two times a day (BID) | ORAL | Status: DC
  Administered 2017-04-06 – 2017-04-09 (×7): 100 mg via ORAL
  Filled 2017-04-06 (×7): qty 1

## 2017-04-06 MED ORDER — METFORMIN HCL ER 500 MG PO TB24
500.0000 mg | ORAL_TABLET | Freq: Every day | ORAL | Status: DC
  Administered 2017-04-07 – 2017-04-09 (×3): 500 mg via ORAL
  Filled 2017-04-06 (×3): qty 1

## 2017-04-06 MED ORDER — PROPOFOL 10 MG/ML IV BOLUS
INTRAVENOUS | Status: AC
  Filled 2017-04-06: qty 20

## 2017-04-06 MED ORDER — ACETAMINOPHEN 10 MG/ML IV SOLN
1000.0000 mg | Freq: Four times a day (QID) | INTRAVENOUS | Status: AC
  Administered 2017-04-06 – 2017-04-07 (×4): 1000 mg via INTRAVENOUS
  Filled 2017-04-06 (×4): qty 100

## 2017-04-06 MED ORDER — TRAMADOL HCL 50 MG PO TABS
50.0000 mg | ORAL_TABLET | ORAL | Status: DC | PRN
  Administered 2017-04-07 – 2017-04-08 (×4): 50 mg via ORAL
  Filled 2017-04-06 (×4): qty 1

## 2017-04-06 MED ORDER — ACETAMINOPHEN 650 MG RE SUPP: 650.0000 mg | Freq: Four times a day (QID) | RECTAL | Status: DC | PRN

## 2017-04-06 MED ORDER — METHOCARBAMOL 1000 MG/10ML IJ SOLN
500.0000 mg | Freq: Four times a day (QID) | INTRAMUSCULAR | Status: DC | PRN
  Filled 2017-04-06: qty 5

## 2017-04-06 MED ORDER — HYDROCHLOROTHIAZIDE 25 MG PO TABS
25.0000 mg | ORAL_TABLET | Freq: Every day | ORAL | Status: DC
  Administered 2017-04-06 – 2017-04-09 (×4): 25 mg via ORAL
  Filled 2017-04-06 (×4): qty 1

## 2017-04-06 MED ORDER — BUPIVACAINE-EPINEPHRINE 0.25% -1:200000 IJ SOLN
INTRAMUSCULAR | Status: DC | PRN
  Administered 2017-04-06: 30 mL

## 2017-04-06 MED ORDER — BUTALBITAL-APAP-CAFFEINE 50-325-40 MG PO TABS: 1.0000 | ORAL_TABLET | Freq: Four times a day (QID) | ORAL | Status: DC | PRN

## 2017-04-06 MED ORDER — ALUM & MAG HYDROXIDE-SIMETH 200-200-20 MG/5ML PO SUSP: 30.0000 mL | ORAL | Status: DC | PRN

## 2017-04-06 MED ORDER — BUPIVACAINE-EPINEPHRINE (PF) 0.25% -1:200000 IJ SOLN
INTRAMUSCULAR | Status: AC
  Filled 2017-04-06: qty 30

## 2017-04-06 MED ORDER — ACETAMINOPHEN 10 MG/ML IV SOLN: 1000.0000 mg | INTRAVENOUS | Status: DC

## 2017-04-06 MED ORDER — TRANEXAMIC ACID 1000 MG/10ML IV SOLN
INTRAVENOUS | Status: DC | PRN
  Administered 2017-04-06: 1000 mg via INTRAVENOUS

## 2017-04-06 MED ORDER — ZOLPIDEM TARTRATE 5 MG PO TABS
5.0000 mg | ORAL_TABLET | Freq: Every evening | ORAL | Status: DC | PRN
  Administered 2017-04-07: 5 mg via ORAL
  Filled 2017-04-06: qty 1

## 2017-04-06 MED ORDER — NITROGLYCERIN 0.4 MG SL SUBL: 0.4000 mg | SUBLINGUAL_TABLET | SUBLINGUAL | Status: DC | PRN

## 2017-04-06 MED ORDER — MEPERIDINE HCL 25 MG/ML IJ SOLN
12.5000 mg | Freq: Once | INTRAMUSCULAR | Status: AC
  Administered 2017-04-06: 12.5 mg via INTRAVENOUS

## 2017-04-06 MED ORDER — AMLODIPINE BESYLATE 5 MG PO TABS
2.5000 mg | ORAL_TABLET | Freq: Every day | ORAL | Status: DC
  Administered 2017-04-07 – 2017-04-09 (×3): 2.5 mg via ORAL
  Filled 2017-04-06 (×3): qty 1

## 2017-04-06 MED ORDER — PANTOPRAZOLE SODIUM 40 MG PO TBEC
80.0000 mg | DELAYED_RELEASE_TABLET | Freq: Every day | ORAL | Status: DC
  Administered 2017-04-07 – 2017-04-09 (×3): 80 mg via ORAL
  Filled 2017-04-06 (×3): qty 2

## 2017-04-06 MED ORDER — MAGNESIUM HYDROXIDE 400 MG/5ML PO SUSP
30.0000 mL | Freq: Every day | ORAL | Status: DC | PRN
  Administered 2017-04-08: 30 mL via ORAL
  Filled 2017-04-06: qty 30

## 2017-04-06 MED ORDER — METHOCARBAMOL 500 MG PO TABS
500.0000 mg | ORAL_TABLET | Freq: Four times a day (QID) | ORAL | Status: DC | PRN
  Administered 2017-04-06 – 2017-04-08 (×3): 500 mg via ORAL
  Filled 2017-04-06 (×3): qty 1

## 2017-04-06 MED ORDER — PHENOL 1.4 % MT LIQD
1.0000 | OROMUCOSAL | Status: DC | PRN
  Filled 2017-04-06: qty 177

## 2017-04-06 MED ORDER — EPHEDRINE SULFATE 50 MG/ML IJ SOLN
INTRAMUSCULAR | Status: DC | PRN
  Administered 2017-04-06: 10 mg via INTRAVENOUS
  Administered 2017-04-06: 5 mg via INTRAVENOUS
  Administered 2017-04-06: 10 mg via INTRAVENOUS
  Administered 2017-04-06: 5 mg via INTRAVENOUS

## 2017-04-06 MED ORDER — PROPOFOL 500 MG/50ML IV EMUL
INTRAVENOUS | Status: AC
  Filled 2017-04-06: qty 50

## 2017-04-06 MED ORDER — LOSARTAN POTASSIUM-HCTZ 100-25 MG PO TABS: 1.0000 | ORAL_TABLET | Freq: Every day | ORAL | Status: DC

## 2017-04-06 MED ORDER — PNEUMOCOCCAL VAC POLYVALENT 25 MCG/0.5ML IJ INJ
0.5000 mL | INJECTION | INTRAMUSCULAR | Status: AC
  Administered 2017-04-07: 0.5 mL via INTRAMUSCULAR
  Filled 2017-04-06: qty 0.5

## 2017-04-06 MED ORDER — ASPIRIN EC 81 MG PO TBEC
81.0000 mg | DELAYED_RELEASE_TABLET | Freq: Every morning | ORAL | Status: DC
  Administered 2017-04-07 – 2017-04-09 (×3): 81 mg via ORAL
  Filled 2017-04-06 (×3): qty 1

## 2017-04-06 MED ORDER — ONDANSETRON HCL 4 MG/2ML IJ SOLN: 4.0000 mg | Freq: Four times a day (QID) | INTRAMUSCULAR | Status: DC | PRN

## 2017-04-06 MED ORDER — ATORVASTATIN CALCIUM 20 MG PO TABS
40.0000 mg | ORAL_TABLET | Freq: Every day | ORAL | Status: DC
  Administered 2017-04-07 – 2017-04-09 (×3): 40 mg via ORAL
  Filled 2017-04-06 (×3): qty 2

## 2017-04-06 MED ORDER — TETRACAINE HCL 1 % IJ SOLN
INTRAMUSCULAR | Status: DC | PRN
  Administered 2017-04-06: 5 mg via INTRASPINAL

## 2017-04-06 MED ORDER — PROPOFOL 10 MG/ML IV BOLUS
INTRAVENOUS | Status: DC | PRN
  Administered 2017-04-06: 70 mg via INTRAVENOUS

## 2017-04-06 MED ORDER — ONDANSETRON HCL 4 MG PO TABS: 4.0000 mg | ORAL_TABLET | Freq: Four times a day (QID) | ORAL | Status: DC | PRN

## 2017-04-06 MED ORDER — METOPROLOL SUCCINATE ER 50 MG PO TB24
50.0000 mg | ORAL_TABLET | Freq: Every day | ORAL | Status: DC
  Administered 2017-04-07 – 2017-04-09 (×3): 50 mg via ORAL
  Filled 2017-04-06 (×3): qty 1

## 2017-04-06 MED ORDER — BISACODYL 10 MG RE SUPP: 10.0000 mg | Freq: Every day | RECTAL | Status: DC | PRN

## 2017-04-06 MED ORDER — PHENYLEPHRINE HCL 10 MG/ML IJ SOLN
INTRAMUSCULAR | Status: AC
  Filled 2017-04-06: qty 1

## 2017-04-06 MED ORDER — CEFAZOLIN SODIUM-DEXTROSE 2-4 GM/100ML-% IV SOLN
2.0000 g | Freq: Four times a day (QID) | INTRAVENOUS | Status: AC
  Administered 2017-04-06 – 2017-04-07 (×3): 2 g via INTRAVENOUS
  Filled 2017-04-06 (×3): qty 100

## 2017-04-06 MED ORDER — METOCLOPRAMIDE HCL 10 MG PO TABS: 5.0000 mg | ORAL_TABLET | Freq: Three times a day (TID) | ORAL | Status: DC | PRN

## 2017-04-06 MED ORDER — ENOXAPARIN SODIUM 40 MG/0.4ML ~~LOC~~ SOLN
40.0000 mg | SUBCUTANEOUS | Status: DC
  Administered 2017-04-07 – 2017-04-09 (×3): 40 mg via SUBCUTANEOUS
  Filled 2017-04-06 (×3): qty 0.4

## 2017-04-06 MED ORDER — LOSARTAN POTASSIUM 50 MG PO TABS
100.0000 mg | ORAL_TABLET | Freq: Every day | ORAL | Status: DC
  Administered 2017-04-06 – 2017-04-09 (×4): 100 mg via ORAL
  Filled 2017-04-06 (×4): qty 2

## 2017-04-06 MED ORDER — VANCOMYCIN HCL IN DEXTROSE 1-5 GM/200ML-% IV SOLN
1000.0000 mg | Freq: Once | INTRAVENOUS | Status: AC
  Administered 2017-04-06: 1000 mg via INTRAVENOUS

## 2017-04-06 MED ORDER — ACETAMINOPHEN 325 MG PO TABS
650.0000 mg | ORAL_TABLET | Freq: Four times a day (QID) | ORAL | Status: DC | PRN
  Administered 2017-04-06: 650 mg via ORAL

## 2017-04-06 MED ORDER — FENTANYL CITRATE (PF) 100 MCG/2ML IJ SOLN: 25.0000 ug | INTRAMUSCULAR | Status: DC | PRN

## 2017-04-06 MED ORDER — BUPROPION HCL ER (XL) 150 MG PO TB24
450.0000 mg | ORAL_TABLET | Freq: Every day | ORAL | Status: DC
  Administered 2017-04-07 – 2017-04-09 (×3): 450 mg via ORAL
  Filled 2017-04-06 (×3): qty 3

## 2017-04-06 MED ORDER — PHENYLEPHRINE HCL 10 MG/ML IJ SOLN
INTRAMUSCULAR | Status: DC | PRN
  Administered 2017-04-06: 100 ug via INTRAVENOUS
  Administered 2017-04-06: 50 ug via INTRAVENOUS
  Administered 2017-04-06: 100 ug via INTRAVENOUS

## 2017-04-06 MED ORDER — CEFAZOLIN SODIUM-DEXTROSE 2-4 GM/100ML-% IV SOLN
INTRAVENOUS | Status: AC
  Filled 2017-04-06: qty 100

## 2017-04-06 MED ORDER — ACETAMINOPHEN 325 MG PO TABS: 650.0000 mg | ORAL_TABLET | Freq: Four times a day (QID) | ORAL | Status: DC | PRN

## 2017-04-06 SURGICAL SUPPLY — 51 items
BLADE SAW SAG 18.5X105 (BLADE) ×3 IMPLANT
BNDG COHESIVE 6X5 TAN STRL LF (GAUZE/BANDAGES/DRESSINGS) ×9 IMPLANT
CANISTER SUCT 1200ML W/VALVE (MISCELLANEOUS) ×3 IMPLANT
CAPT HIP TOTAL HALF DEPUY (Capitated) ×3 IMPLANT
CAPT HIP TOTAL HALF MEDACTA (Capitated) ×3 IMPLANT
CATH FOL LEG HOLDER (MISCELLANEOUS) ×3 IMPLANT
CATH TRAY METER 16FR LF (MISCELLANEOUS) ×3 IMPLANT
CHLORAPREP W/TINT 26ML (MISCELLANEOUS) ×3 IMPLANT
DRAPE C-ARM XRAY 36X54 (DRAPES) ×3 IMPLANT
DRAPE INCISE IOBAN 66X60 STRL (DRAPES) IMPLANT
DRAPE POUCH INSTRU U-SHP 10X18 (DRAPES) ×3 IMPLANT
DRAPE SHEET LG 3/4 BI-LAMINATE (DRAPES) ×9 IMPLANT
DRAPE TABLE BACK 80X90 (DRAPES) ×3 IMPLANT
DRESSING SURGICEL FIBRLLR 1X2 (HEMOSTASIS) ×2 IMPLANT
DRSG OPSITE POSTOP 4X8 (GAUZE/BANDAGES/DRESSINGS) ×6 IMPLANT
DRSG SURGICEL FIBRILLAR 1X2 (HEMOSTASIS) ×6
ELECT BLADE 6.5 EXT (BLADE) ×3 IMPLANT
ELECT REM PT RETURN 9FT ADLT (ELECTROSURGICAL) ×3
ELECTRODE REM PT RTRN 9FT ADLT (ELECTROSURGICAL) ×1 IMPLANT
EVACUATOR 1/8 PVC DRAIN (DRAIN) IMPLANT
GLOVE BIOGEL PI IND STRL 9 (GLOVE) ×1 IMPLANT
GLOVE BIOGEL PI INDICATOR 9 (GLOVE) ×2
GLOVE SURG SYN 9.0  PF PI (GLOVE) ×4
GLOVE SURG SYN 9.0 PF PI (GLOVE) ×2 IMPLANT
GOWN SRG 2XL LVL 4 RGLN SLV (GOWNS) ×1 IMPLANT
GOWN STRL NON-REIN 2XL LVL4 (GOWNS) ×2
GOWN STRL REUS W/ TWL LRG LVL3 (GOWN DISPOSABLE) ×1 IMPLANT
GOWN STRL REUS W/TWL LRG LVL3 (GOWN DISPOSABLE) ×2
HOOD PEEL AWAY FLYTE STAYCOOL (MISCELLANEOUS) ×3 IMPLANT
KIT PREVENA INCISION MGT 13 (CANNISTER) ×3 IMPLANT
MAT BLUE FLOOR 46X72 FLO (MISCELLANEOUS) ×3 IMPLANT
NDL SAFETY 18GX1.5 (NEEDLE) ×3 IMPLANT
NEEDLE SPNL 18GX3.5 QUINCKE PK (NEEDLE) ×3 IMPLANT
NS IRRIG 1000ML POUR BTL (IV SOLUTION) ×3 IMPLANT
PACK HIP COMPR (MISCELLANEOUS) ×3 IMPLANT
SOL PREP PVP 2OZ (MISCELLANEOUS) ×3
SOLUTION PREP PVP 2OZ (MISCELLANEOUS) ×1 IMPLANT
SPONGE DRAIN TRACH 4X4 STRL 2S (GAUZE/BANDAGES/DRESSINGS) ×3 IMPLANT
STAPLER SKIN PROX 35W (STAPLE) ×3 IMPLANT
STRAP SAFETY BODY (MISCELLANEOUS) ×3 IMPLANT
SUT DVC 2 QUILL PDO  T11 36X36 (SUTURE) ×2
SUT DVC 2 QUILL PDO T11 36X36 (SUTURE) ×1 IMPLANT
SUT SILK 0 (SUTURE) ×2
SUT SILK 0 30XBRD TIE 6 (SUTURE) ×1 IMPLANT
SUT V-LOC 90 ABS DVC 3-0 CL (SUTURE) ×3 IMPLANT
SUT VIC AB 1 CT1 36 (SUTURE) ×3 IMPLANT
SYR 20CC LL (SYRINGE) ×3 IMPLANT
SYR 30ML LL (SYRINGE) ×3 IMPLANT
TAPE MICROFOAM 4IN (TAPE) ×3 IMPLANT
TOWEL OR 17X26 4PK STRL BLUE (TOWEL DISPOSABLE) ×3 IMPLANT
WND VAC CANISTER 500ML (MISCELLANEOUS) ×3 IMPLANT

## 2017-04-06 NOTE — Anesthesia Procedure Notes (Addendum)
Spinal  Patient location during procedure: OR Staffing Anesthesiologist: Alvin Critchley, MD Performed: anesthesiologist  Preanesthetic Checklist Completed: patient identified, site marked, surgical consent, pre-op evaluation, timeout performed, IV checked, risks and benefits discussed and monitors and equipment checked Spinal Block Patient position: sitting Prep: Betadine Patient monitoring: heart rate, continuous pulse ox, blood pressure and cardiac monitor Approach: midline Location: L3-4 Injection technique: single-shot Needle Needle type: Whitacre and Introducer  Needle gauge: 25 G Needle length: 9 cm Assessment Sensory level: T10 Additional Notes Negative paresthesia. Negative blood return. Positive free-flowing CSF. Expiration date of kit checked and confirmed. Patient tolerated procedure well, without complications.  1st spinal attempt failed per CRNA.  2nd attempt by MDA Kayleen Memos successful.  No comp-lications

## 2017-04-06 NOTE — Anesthesia Post-op Follow-up Note (Signed)
Anesthesia QCDR form completed.        

## 2017-04-06 NOTE — NC FL2 (Signed)
  [place] LEVEL OF CARE SCREENING TOOL     IDENTIFICATION  Patient Name: Jimmy Green Birthdate: 23-Mar-1951 Sex: male Admission Date (Current Location): 04/06/2017  [facility] and [place] Number:  Selena Lesser  ([id number] [facility]) Facility and Address:  [facility], [address] [place], [place] [address]      Provider Number: [id number]  Attending Physician Name and Address:  Hessie Knows, MD  Relative Name and Phone Number:       Current Level of Care: Hospital Recommended Level of Care: [place] Prior Approval Number:    Date Approved/Denied:   PASRR Number:   [id number]   Discharge Plan: SNF    Current Diagnoses: Patient Active Problem List   Diagnosis Date Noted  . Primary localized osteoarthritis of left hip 04/06/2017  . Dyslipidemia associated with type 2 diabetes mellitus ([place]) 10/04/2016  . Trigger thumb of both thumbs 06/02/2016  . Chronic tension-type headache, intractable 09/25/2015  . Benign paroxysmal positional vertigo due to bilateral vestibular disorder 09/25/2015  . Hearing loss sensory, bilateral 08/26/2015  . Cerebral microvascular disease 08/26/2015  . Metabolic syndrome 04/25/3006  . Vertigo 06/21/2015  . Buzzing in ear 06/21/2015  . Hypertension goal BP (blood pressure) < 140/90 03/28/2015  . GERD (gastroesophageal reflux disease) 03/28/2015  . Hyperlipidemia 03/28/2015  . Depression, major 03/28/2015  . CAD in native artery 03/28/2015  . Actinic keratosis 02/14/2015  . Obstructive sleep apnea 02/03/2015  . Diverticulosis of colon without hemorrhage 01/31/2015  . Hx of colonic polyps   . Benign neoplasm of descending colon   . Benign neoplasm of sigmoid colon   . Idiopathic colitis   . Sebaceous cyst 09/14/2014    Orientation RESPIRATION BLADDER Height & Weight     Self, Time, Situation, Place  Normal Continent Weight:   Height:     BEHAVIORAL SYMPTOMS/MOOD NEUROLOGICAL BOWEL  NUTRITION STATUS      Continent Diet (regular)  AMBULATORY STATUS COMMUNICATION OF NEEDS Skin   Extensive Assist Verbally Surgical wounds (Incision left hip. Provena wound vac)                       Personal Care Assistance Level of Assistance  Bathing, Feeding, Dressing Bathing Assistance: Limited assistance Feeding assistance: Independent Dressing Assistance: Limited assistance     Functional Limitations Info  Sight, Hearing, Speech Sight Info: Adequate Hearing Info: Adequate Speech Info: Adequate    SPECIAL CARE FACTORS FREQUENCY  PT (By licensed PT), OT (By licensed OT)     PT Frequency:  (5) OT Frequency:  (5)            Contractures      Additional Factors Info  Allergies, Isolation Precautions, Code Status Code Status Info:  (full code) Allergies Info:  (Elavil)     Isolation Precautions Info:  (MRSA Nasal Swab)     Current Medications (04/06/2017):  This is the current hospital active medication list No current facility-administered medications for this encounter.      Discharge Medications: Please see discharge summary for a list of discharge medications.  Relevant Imaging Results:  Relevant Lab Results:   Additional Information  SSN: [id number]  Miata Culbreth, Veronia Beets, LCSW

## 2017-04-06 NOTE — Care Management Note (Signed)
Case Management Note  Patient Details  Name: Jimmy Green MRN: [id number] Date of Birth: 1950/11/24  Subjective/Objective:  RNCM consult for discharge planning. POD # 0 for THA (left). PT pending. Will follow and assist as needed.                   Action/Plan:   Expected Discharge Date:                  Expected Discharge Plan:     In-House Referral:     Discharge planning Services  CM Consult  Post Acute Care Choice:    Choice offered to:     DME Arranged:    DME Agency:     HH Arranged:    HH Agency:     Status of Service:  In process, will continue to follow  If discussed at Long Length of Stay Meetings, dates discussed:    Additional Comments:  Jolly Mango, RN 04/06/2017, 3:46 PM

## 2017-04-06 NOTE — H&P (Signed)
Reviewed paper H+P, will be scanned into chart. No changes noted.  

## 2017-04-06 NOTE — Anesthesia Preprocedure Evaluation (Addendum)
Anesthesia Evaluation  Patient identified by MRN, date of birth, ID band Patient awake    Reviewed: Allergy & Precautions, NPO status , Patient's Chart, lab work & pertinent test results, reviewed documented beta blocker date and time   History of Anesthesia Complications Negative for: history of anesthetic complications  Airway Mallampati: III  TM Distance: >3 FB Neck ROM: Full    Dental  (+) Chipped,    Pulmonary sleep apnea , former smoker,    Pulmonary exam normal breath sounds clear to auscultation       Cardiovascular hypertension, Pt. on medications and Pt. on home beta blockers + angina with exertion + CAD, + Past MI and + Orthopnea  Normal cardiovascular exam Rhythm:Regular Rate:Normal  S/p cardiac stent 2013   Neuro/Psych  Headaches, PSYCHIATRIC DISORDERS Depression negative neurological ROS     GI/Hepatic negative GI ROS, Neg liver ROS, GERD  Medicated and Controlled,  Endo/Other  negative endocrine ROSdiabetes, Well Controlled, Type 2, Oral Hypoglycemic Agents  Renal/GU negative Renal ROS  negative genitourinary   Musculoskeletal  (+) Arthritis , Osteoarthritis,    Abdominal   Peds negative pediatric ROS (+)  Hematology negative hematology ROS (+)   Anesthesia Other Findings   Reproductive/Obstetrics negative OB ROS                            Anesthesia Physical  Anesthesia Plan  ASA: III  Anesthesia Plan: Spinal   Post-op Pain Management:    Induction: Intravenous  PONV Risk Score and Plan: 2 and Ondansetron and Dexamethasone  Airway Management Planned: Nasal Cannula  Additional Equipment:   Intra-op Plan:   Post-operative Plan:   Informed Consent: I have reviewed the patients History and Physical, chart, labs and discussed the procedure including the risks, benefits and alternatives for the proposed anesthesia with the patient or authorized representative who  has indicated his/her understanding and acceptance.   Dental advisory given  Plan Discussed with: CRNA and Surgeon  Anesthesia Plan Comments:        Anesthesia Quick Evaluation

## 2017-04-06 NOTE — Op Note (Signed)
04/06/2017  12:20 PM  PATIENT:  Jimmy Green  66 y.o. male  PRE-OPERATIVE DIAGNOSIS:  PRIMARY OSTEOARTHRITIS OF LEFT HIP  POST-OPERATIVE DIAGNOSIS:  PRIMARY OSTEOARTHRITIS OF LEFT HIP  PROCEDURE:  Procedure(s): TOTAL HIP ARTHROPLASTY ANTERIOR APPROACH (Left)  SURGEON: Laurene Footman, MD  ASSISTANTS: None  ANESTHESIA:   spinal  EBL:  Total I/O In: 400 [I.V.:400] Out: 850 [Urine:250; Blood:600]  BLOOD ADMINISTERED:none  DRAINS: none   LOCAL MEDICATIONS USED:  MARCAINE     SPECIMEN:  Source of Specimen:  Left femoral head  DISPOSITION OF SPECIMEN:  PATHOLOGY  COUNTS:  YES  TOURNIQUET:  * No tourniquets in log *  IMPLANTS: Medacta Mpact 56 DM with liner DePuy Actis 5 high offset stem with +5 mm 28 mm metal head  DICTATION: .Dragon Dictation    The patient was brought to the operating room and after spinal anesthesia was obtained patient was placed on the operative table with the ipsilateral foot into the Medacta attachment, contralateral leg on a well-padded table. C-arm was brought in and preop template x-ray taken. After prepping and draping in usual sterile fashion appropriate patient identification and timeout procedures were completed. Anterior approach to the hip was obtained and centered over the greater trochanter and TFL muscle. The subcutaneous tissue was incised hemostasis being achieved by electrocautery. TFL fascia was incised and the muscle retracted laterally deep retractor placed. The lateral femoral circumflex vessels were identified and ligated. The anterior capsule was exposed and a capsulotomy performed. The neck was identified and a femoral neck cut carried out with a saw. The head was removed without difficulty and showed sclerotic femoral head and acetabulum. Reaming was carried out to 54 mm and a 56 mm cup trial gave appropriate tightness to the acetabular component a 56 DM cup was impacted into position. The leg was then externally rotated and  ischiofemoral and pubofemoral releases carried out. The femur was sequentially broached to a size 5, size 5 high offset and +1.5 mm head trials were placed and the final components chosen. The 5 high offset stem was inserted along with a +5 28 mm head and 56 mm liner. The hip was reduced and was stable the wound was thoroughly irrigated with fibrillar placed on the posterior capsule and medial neck. The deep fascia was closed using a heavy Quill after infiltration of 30 cc of quarter percent Sensorcaine with epinephrine. 2-0 Vicryl to close the skin with skin staples followed by an incisional wound VAC   PLAN OF CARE: Admit to inpatient

## 2017-04-06 NOTE — Transfer of Care (Signed)
Immediate Anesthesia Transfer of Care Note  Patient: Jimmy Green  Procedure(s) Performed: Procedure(s): TOTAL HIP ARTHROPLASTY ANTERIOR APPROACH (Left)  Patient Location: PACU  Anesthesia Type:Spinal  Level of Consciousness: sedated  Airway & Oxygen Therapy: Patient Spontanous Breathing and Patient connected to face mask oxygen  Post-op Assessment: Report given to RN and Post -op Vital signs reviewed and stable  Post vital signs: Reviewed and stable  Last Vitals:  Vitals:   04/06/17 0752  BP: (!) 148/90  Pulse: 62  Resp: 16  Temp: 36.7 C  SpO2: 100%    Last Pain:  Vitals:   04/06/17 0752  TempSrc: Oral         Complications: No apparent anesthesia complications

## 2017-04-06 NOTE — H&P (Signed)
Chief Complaint: Chief Complaint  Patient presents with  . Hip Pain  Left hip pain wants to discuss sx   Jimmy Green is a 66 y.o. male who presents today for evaluation of left hip pain. On 12/10/2016, patient underwent a left hip intra-articular joint injection, patient received 100% relief for 3-4 days. Patient's pain is currently 6 out of 10 located in the left buttocks, lateral hip and anterior left thigh. Patient states his left hip will give way. Pain continues to be present with standing, walking as well as with hip flexion. He has stiffness in the left hip. He takes ibuprofen 800 mg daily with minimal to no improvement. Patient's pain is moderate to severe. He denies any back pain numbness tingling or radicular symptoms. No trauma or injury. Patient has discussed total hip arthroplasty would like to proceed with anterior hip replacement as soon as possible.  Past Medical History: Past Medical History:  Diagnosis Date  . Depression, unspecified  . Diabetes mellitus type 2, uncomplicated (CMS-HCC)  . GERD (gastroesophageal reflux disease)  . Heart disease  . Hyperlipidemia, unspecified  . Hypertension  . Sleep apnea   Past Surgical History: Past Surgical History:  Procedure Laterality Date  . back surgery  . knee surgery  . REPAIR INGUINAL HERNIA   Past Family History: Family History  Problem Relation Age of Onset  . Alzheimer's disease Mother  . Cancer Father   Medications: Current Outpatient Prescriptions Ordered in Epic  Medication Sig Dispense Refill  . amLODIPine (NORVASC) 2.5 MG tablet Take by mouth.  [name] aspirin 81 MG EC tablet Take by mouth.  [name] atorvastatin (LIPITOR) 40 MG tablet Take by mouth.  [name] buPROPion (WELLBUTRIN XL) 150 MG XL tablet Take by mouth.  . butalbital-aspirin-caffeine-codeine (FIORINAL WITH CODEINE) capsule Take 1 capsule by mouth every 4 (four) hours as needed for Pain.  [name] escitalopram oxalate (LEXAPRO) 20 MG tablet Take by mouth.  .  losartan-hydrochlorothiazide (HYZAAR) 100-12.5 mg tablet Take by mouth.  . meloxicam (MOBIC) 7.5 MG tablet Take 1 tablet (7.5 mg total) by mouth once daily. 30 tablet 0  . metFORMIN (GLUCOPHAGE-XR) 500 MG XR tablet Take 1 tablet by mouth once daily. 2  . metoprolol succinate (TOPROL-XL) 50 MG XL tablet Take by mouth.  . nitroGLYcerin (NITROSTAT) 0.4 MG SL tablet Place under the tongue.  [name] omeprazole 20 mg Take by mouth.   No current Epic-ordered facility-administered medications on file.   Allergies: No Known Allergies   Review of Systems:  A comprehensive 14 point ROS was performed, reviewed by me today, and the pertinent orthopaedic findings are documented in the HPI.  Exam: BP 150/82 (BP Location: Left upper arm, Patient Position: Sitting, BP Cuff Size: Adult)  Ht 175.3 cm (5\' 9" )  Wt (!) 102.2 kg (225 lb 3.2 oz)  BMI 33.26 kg/m  General:  Well developed, well nourished, no apparent distress, normal affect, normal gait, mild antalgic component. He ambulates with no assistive devices.  HEENT: Head normocephalic, atraumatic, PERRL.   Abdomen: Soft, non tender, non distended, Bowel sounds present.  Heart: Examination of the heart reveals regular, rate, and rhythm. There is no murmur noted on ascultation. There is a normal apical pulse.  Lungs: Lungs are clear to auscultation. There is no wheeze, rhonchi, or crackles. There is normal expansion of bilateral chest walls.   Lumbar Spine: Examination of the lumbar spine reveals no bony abnormality, no edema, and no ecchymosis. There is no step off. The patient has full range  of motion of the lumbar spine with flexion and extension. The patient has normal lateral bend and rotation. The patient has no pain with range of motion activities. The patient is non tender along the spinous process. The patient is non tender along the paravertebral muscles, with no muscle spasms. The patient is non tender along the iliac crest. The patient is  non tender in the sciatic notch. The patient is non tender along the Sacroiliac joint. There is no Coccyx joint tenderness.   Left lower Extremities: Examination of the left lower extremities reveals no bony abnormality, no edema, and no ecchymosis. The patient has slightly limited left hip internal rotation, normal external rotation.. The patient has no anterior hip joint tenderness. The patient has a negative axial load test at the hip joint. The patient has pain with hip flexion. There is moderate discomfort left hip internal rotation. The patient is minimally tender along the greater trochanter region. The patient has a negative Bevelyn Buckles' test bilaterally. There is normal skin warmth. There is normal capillary refill bilaterally.   Neurologic: The patient has a negative straight leg raise. The patient has normal muscle strength testing for the quadriceps, calves, ankle dorsiflexion, ankle plantarflexion, and extensor hallicus longus. The patient has sensation that is intact to light touch. The deep tendon reflexes are normal at the patella and achilles. No clonus is noted.   Imaging: AP view of the pelvis and left lateral hip x-rays were reviewed by me in the office today. Impression: Patient has moderate subchondral changes in the superior and inferior acetabulum with superior greater than inferior acetabular spurring. There is adequate superior acetabular joint space. Minimal central joint space narrowing. No evidence of acute bony abnormality  Impression: Primary osteoarthritis of left hip [M16.12] Primary osteoarthritis of left hip (primary encounter diagnosis)  Plan:  1. Risks, benefits, complications of a left total hip replacement were discussed with the patient. Patient has agreed and consented to the procedure with Dr. Rudene Christians.  This note was generated in part with voice recognition software and I apologize for any typographical errors that were not detected and corrected.  Jimmy Green MPA-C    Back to top of Progress Notes Lyndle Herrlich, CMA - 03/26/2017 8:45 AM EDT

## 2017-04-06 NOTE — Progress Notes (Signed)
Pt. C/o severe pain in left hip. Dr. Marry Guan notified and ordered demerol 12.5mg  and phenergan 12.5mg  once. Tramadol 50mg  q4h prn and tylenol iv q6h x3 doses

## 2017-04-07 ENCOUNTER — Encounter: Payer: Self-pay | Admitting: Orthopedic Surgery

## 2017-04-07 LAB — BASIC METABOLIC PANEL
Anion gap: 7 (ref 5–15)
BUN: 14 mg/dL (ref 6–20)
CHLORIDE: 104 mmol/L (ref 101–111)
CO2: 24 mmol/L (ref 22–32)
Calcium: 8.3 mg/dL — ABNORMAL LOW (ref 8.9–10.3)
Creatinine, Ser: 0.67 mg/dL (ref 0.61–1.24)
Glucose, Bld: 131 mg/dL — ABNORMAL HIGH (ref 65–99)
POTASSIUM: 3.5 mmol/L (ref 3.5–5.1)
SODIUM: 135 mmol/L (ref 135–145)

## 2017-04-07 LAB — CBC
HEMATOCRIT: 33.7 % — AB (ref 40.0–52.0)
HEMOGLOBIN: 11.9 g/dL — AB (ref 13.0–18.0)
MCH: 31.9 pg (ref 26.0–34.0)
MCHC: 35.3 g/dL (ref 32.0–36.0)
MCV: 90.3 fL (ref 80.0–100.0)
Platelets: 205 10*3/uL (ref 150–440)
RBC: 3.73 MIL/uL — AB (ref 4.40–5.90)
RDW: 13.7 % (ref 11.5–14.5)
WBC: 10.9 10*3/uL — ABNORMAL HIGH (ref 3.8–10.6)

## 2017-04-07 LAB — GLUCOSE, CAPILLARY
GLUCOSE-CAPILLARY: 105 mg/dL — AB (ref 65–99)
GLUCOSE-CAPILLARY: 109 mg/dL — AB (ref 65–99)
Glucose-Capillary: 98 mg/dL (ref 65–99)
Glucose-Capillary: 106 mg/dL — ABNORMAL HIGH (ref 65–99)

## 2017-04-07 NOTE — Progress Notes (Signed)
Clinical Social Worker (CSW) received SNF consult. PT is recommending home health. RN case manager aware of above. Please reconsult if future social work needs arise. CSW signing off.   Lataysha Vohra, LCSW [phone] 

## 2017-04-07 NOTE — Anesthesia Postprocedure Evaluation (Signed)
Anesthesia Post Note  Patient: TRIGO WINTERBOTTOM  Procedure(s) Performed: Procedure(s) (LRB): TOTAL HIP ARTHROPLASTY ANTERIOR APPROACH (Left)  Patient location during evaluation: Nursing Unit Anesthesia Type: Spinal Level of consciousness: awake, awake and alert and oriented Pain management: pain level controlled Vital Signs Assessment: post-procedure vital signs reviewed and stable Respiratory status: spontaneous breathing, nonlabored ventilation and respiratory function stable Cardiovascular status: blood pressure returned to baseline and stable Postop Assessment: no headache and no backache Anesthetic complications: no     Last Vitals:  Vitals:   04/07/17 0459 04/07/17 0726  BP: (!) 113/56 103/63  Pulse: 74 65  Resp: 18   Temp: 37.2 C 37 C  SpO2: 92% 93%    Last Pain:  Vitals:   04/07/17 0726  TempSrc: Oral  PainSc:                  Johnna Acosta

## 2017-04-07 NOTE — Progress Notes (Signed)
Physical Therapy Treatment Patient Details Name: Jimmy Green MRN: [id number] DOB: [birth year] Today's Date: 04/07/2017    History of Present Illness Pt is a 66 y/o M s/p L THA, direct anterior approach.  Pt's PMH includes depression, back surgery, knee surgery, skin cancer, MI.    PT Comments    Mr. Radde is making good progress with mobility.  He was able to perform bed mobility without physical assist utilizing leg hook technique.  He ambulated 60 ft using RW, progressing to a step through gait pattern following demonstration and cues.  He requires encouragement but is progressing well and follow up recommendations remain appropriate.  He will need to complete stair training before d/c.     Follow Up Recommendations  Home health PT;Supervision for mobility/OOB     Equipment Recommendations  None recommended by PT    Recommendations for Other Services       Precautions / Restrictions Precautions Precautions: Fall Restrictions Weight Bearing Restrictions: Yes LLE Weight Bearing: Weight bearing as tolerated    Mobility  Bed Mobility Overal bed mobility: Needs Assistance Bed Mobility: Supine to Sit     Supine to sit: HOB elevated;Min guard     General bed mobility comments: Cues for leg hook which pt is able to perform without assist to advance LEs off side of bed.  Pt uses bed rail to pull up into sitting with increased time and effort.   Transfers Overall transfer level: Needs assistance Equipment used: Rolling walker (2 wheeled) Transfers: Sit to/from Stand Sit to Stand: Min guard         General transfer comment: Pt demonstrates proper hand placement and technique with sit<>stand.  Pt is slow to stand.  He controls descent to chair well.   Ambulation/Gait Ambulation/Gait assistance: Min guard Ambulation Distance (Feet): 60 Feet Assistive device: Rolling walker (2 wheeled) Gait Pattern/deviations: Step-to pattern;Decreased stance time - left;Decreased step  length - right;Decreased weight shift to left;Antalgic;Trunk flexed;Step-through pattern Gait velocity: decreased Gait velocity interpretation: Below normal speed for age/gender General Gait Details: Pt demonstrates proper sequencing with RW.  Demonstration and cues for step through gait pattern which pt is then able to demonstrate back to this therapist.  2 standing rest breaks due to fatigue and pain.    Stairs            Wheelchair Mobility    Modified Rankin (Stroke Patients Only)       Balance Overall balance assessment: Needs assistance Sitting-balance support: No upper extremity supported;Feet supported Sitting balance-Leahy Scale: Good     Standing balance support: Bilateral upper extremity supported;During functional activity Standing balance-Leahy Scale: Poor Standing balance comment: Pt relies on BUE support for static and dynamic activities                            Cognition Arousal/Alertness: Awake/alert Behavior During Therapy: WFL for tasks assessed/performed Overall Cognitive Status: Within Functional Limits for tasks assessed                                        Exercises Total Joint Exercises Ankle Circles/Pumps: AROM;Both;10 reps;Supine Quad Sets: Strengthening;Both;10 reps;Supine Gluteal Sets: Strengthening;Both;10 reps;Supine Hip ABduction/ADduction: AAROM;Strengthening;Left;10 reps;Supine Straight Leg Raises: AAROM;Strengthening;Left;10 reps;Supine Long Arc Quad: Strengthening;Left;AROM;10 reps;Seated General Exercises - Lower Extremity Hip Flexion/Marching: AAROM;Left;AROM;Right;10 reps;Seated    General Comments  Pertinent Vitals/Pain Pain Assessment: Faces Faces Pain Scale: Hurts even more Pain Location: L hip Pain Descriptors / Indicators: Aching Pain Intervention(s): Limited activity within patient's tolerance;Monitored during session;Ice applied;Premedicated before session;Repositioned     Home Living                      Prior Function            PT Goals (current goals can now be found in the care plan section) Acute Rehab PT Goals Patient Stated Goal: To go home.  Decreased pain. PT Goal Formulation: With patient Time For Goal Achievement: 04/21/17 Potential to Achieve Goals: Good Progress towards PT goals: Progressing toward goals    Frequency    BID      PT Plan Current plan remains appropriate    Co-evaluation              AM-PAC PT "6 Clicks" Daily Activity  Outcome Measure  Difficulty turning over in bed (including adjusting bedclothes, sheets and blankets)?: Unable Difficulty moving from lying on back to sitting on the side of the bed? : Unable Difficulty sitting down on and standing up from a chair with arms (e.g., wheelchair, bedside commode, etc,.)?: A Little Help needed moving to and from a bed to chair (including a wheelchair)?: A Little Help needed walking in hospital room?: A Little Help needed climbing 3-5 steps with a railing? : A Lot 6 Click Score: 13    End of Session Equipment Utilized During Treatment: Gait belt Activity Tolerance: Patient limited by pain;Patient limited by fatigue Patient left: in chair;with call bell/phone within reach;with chair alarm set Nurse Communication: Mobility status PT Visit Diagnosis: Pain;Unsteadiness on feet (R26.81);Muscle weakness (generalized) (M62.81);Other abnormalities of gait and mobility (R26.89) Pain - Right/Left: Left Pain - part of body: Hip     Time: 1610-9604 PT Time Calculation (min) (ACUTE ONLY): 33 min  Charges:  $Gait Training: 8-22 mins $Therapeutic Exercise: 8-22 mins                    G Codes:       Collie Siad PT, DPT 04/07/2017, 3:42 PM

## 2017-04-07 NOTE — Care Management Note (Signed)
Case Management Note  Patient Details  Name: Jimmy Green MRN: [id number] Date of Birth: April 28, 1951  Subjective/Objective:  Pod # 1 THA. Met with patient and his spouse at bedside. Has a walker. Offered choice of home health agencies. Referral to [facility] for HHPT. Pharmacy: [facility] [phone]. Called lovenox 40 mg #14 no refills.                  Action/Plan: [facility] for HHPT, Lovenox called in. No DME.   Expected Discharge Date:                  Expected Discharge Plan:  [place]  In-House Referral:     Discharge planning Services  CM Consult  Post Acute Care Choice:  Home Health Choice offered to:  Patient, Spouse  DME Arranged:    DME Agency:     HH Arranged:  PT [place]:  [facility] (formerly [employer])  Status of Service:  In process, will continue to follow  If discussed at Long Length of Stay Meetings, dates discussed:    Additional Comments:  Jolly Mango, RN 04/07/2017, 11:24 AM

## 2017-04-07 NOTE — Evaluation (Signed)
Physical Therapy Evaluation Patient Details Name: Jimmy Green MRN: [id number] DOB: 16-Feb-1951 Today's Date: 04/07/2017   History of Present Illness  Pt is a 66 y/o M s/p L THA, direct anterior approach.  Pt's PMH includes depression, back surgery, knee surgery, skin cancer, MI.    Clinical Impression  Pt is s/p L THA resulting in the deficits listed below (see PT Problem List). Jimmy Green was limited due to pain but was agreeable to therapy. He currently requires min assist for bed mobility and close min guard assist for transfers and ambulation.  He ambulated 59ft x2 with one seated rest break.  He will have 24/7 assist/supervision available from his wife at d/c.  Pt will benefit from skilled PT to increase their independence and safety with mobility to allow discharge to the venue listed below.     Follow Up Recommendations Home health PT;Supervision for mobility/OOB    Equipment Recommendations  None recommended by PT    Recommendations for Other Services       Precautions / Restrictions Precautions Precautions: Fall Restrictions Weight Bearing Restrictions: Yes LLE Weight Bearing: Weight bearing as tolerated      Mobility  Bed Mobility Overal bed mobility: Needs Assistance Bed Mobility: Supine to Sit     Supine to sit: Min assist;HOB elevated     General bed mobility comments: Cues for sequencing and assist advancing LLE to EOB and elevating trunk.  Increased time and effort.  Transfers Overall transfer level: Needs assistance Equipment used: Rolling walker (2 wheeled) Transfers: Sit to/from Stand Sit to Stand: Min guard         General transfer comment: Cues for proper hand placement and safe technique.  Pt is slow to rise and close min guard as pt demonstrates mild instability.  Cues to reach back to armrests and to sit slowly.  Pt demonstrated good eccentric control to sit.  Ambulation/Gait Ambulation/Gait assistance: Min guard;+2  safety/equipment Ambulation Distance (Feet): 40 Feet (20 ft x2) Assistive device: Rolling walker (2 wheeled) Gait Pattern/deviations: Step-to pattern;Decreased stance time - left;Decreased step length - right;Decreased weight shift to left;Antalgic;Trunk flexed Gait velocity: decreased Gait velocity interpretation: <1.8 ft/sec, indicative of risk for recurrent falls General Gait Details: Cues for upright posture and forward gaze.  Cues for proper sequencing using RW.  Pt reports dizziness after ambulating ~20 ft and takes a seated rest break.  Dizziness clears within 30 seconds.  Pt then ambulates another 20 ft before sitting again due to fatigue and pain.    Stairs            Wheelchair Mobility    Modified Rankin (Stroke Patients Only)       Balance Overall balance assessment: Needs assistance Sitting-balance support: No upper extremity supported;Feet supported Sitting balance-Leahy Scale: Good     Standing balance support: Bilateral upper extremity supported;During functional activity Standing balance-Leahy Scale: Poor Standing balance comment: Pt relies on BUE support for static and dynamic activities                             Pertinent Vitals/Pain Pain Assessment: Faces Faces Pain Scale: Hurts whole lot Pain Location: L hip Pain Descriptors / Indicators: Aching Pain Intervention(s): Limited activity within patient's tolerance;Monitored during session;Repositioned;Utilized relaxation techniques    Home Living Family/patient expects to be discharged to:: Private residence Living Arrangements: Spouse/significant other Available Help at Discharge: Family;Available 24 hours/day Type of Home: House Home Access: Stairs to enter Entrance  Stairs-Rails: Right Entrance Stairs-Number of Steps: 3 Home Layout: One level Home Equipment: Clinical cytogeneticist - 2 wheels;Cane - single point;Cane - quad      Prior Function Level of Independence: Independent          Comments: Pt independent with driving, bathing, cooking, cleaning.  Pt retired and enjoys working in the yard.  No falls over the past 6 months.  He has been ambulating without AD.     Hand Dominance        Extremity/Trunk Assessment   Upper Extremity Assessment Upper Extremity Assessment: Overall WFL for tasks assessed    Lower Extremity Assessment Lower Extremity Assessment: LLE deficits/detail LLE Deficits / Details: L hip strength grossly 2/5, otherwise RLE strength grossly at least 4/5 LLE: Unable to fully assess due to pain       Communication   Communication: No difficulties  Cognition Arousal/Alertness: Awake/alert Behavior During Therapy: WFL for tasks assessed/performed Overall Cognitive Status: Within Functional Limits for tasks assessed                                        General Comments      Exercises Total Joint Exercises Ankle Circles/Pumps: AROM;Both;10 reps;Supine Quad Sets: Strengthening;Both;10 reps;Supine Hip ABduction/ADduction: AAROM;Strengthening;Left;10 reps;Supine Straight Leg Raises: AAROM;Strengthening;Left;10 reps;Supine   Assessment/Plan    PT Assessment Patient needs continued PT services  PT Problem List Decreased strength;Decreased range of motion;Decreased activity tolerance;Decreased balance;Decreased mobility;Decreased knowledge of use of DME;Decreased safety awareness;Pain       PT Treatment Interventions DME instruction;Gait training;Stair training;Functional mobility training;Therapeutic activities;Therapeutic exercise;Balance training;Neuromuscular re-education;Patient/family education;Modalities    PT Goals (Current goals can be found in the Care Plan section)  Acute Rehab PT Goals Patient Stated Goal: To go home.  Decreased pain. PT Goal Formulation: With patient Time For Goal Achievement: 04/21/17 Potential to Achieve Goals: Good    Frequency BID   Barriers to discharge        Co-evaluation                AM-PAC PT "6 Clicks" Daily Activity  Outcome Measure Difficulty turning over in bed (including adjusting bedclothes, sheets and blankets)?: Unable Difficulty moving from lying on back to sitting on the side of the bed? : Unable Difficulty sitting down on and standing up from a chair with arms (e.g., wheelchair, bedside commode, etc,.)?: A Little Help needed moving to and from a bed to chair (including a wheelchair)?: A Little Help needed walking in hospital room?: A Little Help needed climbing 3-5 steps with a railing? : A Lot 6 Click Score: 13    End of Session Equipment Utilized During Treatment: Gait belt Activity Tolerance: Patient limited by pain;Patient limited by fatigue Patient left: in chair;with call bell/phone within reach;with chair alarm set Nurse Communication: Mobility status;Weight bearing status PT Visit Diagnosis: Pain;Unsteadiness on feet (R26.81);Muscle weakness (generalized) (M62.81);Other abnormalities of gait and mobility (R26.89) Pain - Right/Left: Left Pain - part of body: Hip    Time: 7062-3762 PT Time Calculation (min) (ACUTE ONLY): 38 min   Charges:   PT Evaluation $PT Eval Low Complexity: 1 Low PT Treatments $Gait Training: 8-22 mins $Therapeutic Exercise: 8-22 mins   PT G Codes:   PT G-Codes **NOT FOR INPATIENT CLASS** Functional Assessment Tool Used: AM-PAC 6 Clicks Basic Mobility;Clinical judgement Functional Limitation: Mobility: Walking and moving around Mobility: Walking and Moving Around Current Status (G3151): At  least 40 percent but less than 60 percent impaired, limited or restricted Mobility: Walking and Moving Around Goal Status [phone]): At least 20 percent but less than 40 percent impaired, limited or restricted    Collie Siad PT, DPT 04/07/2017, 10:56 AM

## 2017-04-07 NOTE — Progress Notes (Signed)
   Subjective: 1 Day Post-Op Procedure(s) (LRB): TOTAL HIP ARTHROPLASTY ANTERIOR APPROACH (Left) Patient reports pain as moderate.   Patient is well, and has had no acute complaints or problems. Pain severe last night, improved this am. Denies any CP, SOB, ABD pain. We will continue therapy today.  Plan is to go Home after hospital stay.  Objective: Vital signs in last 24 hours: Temp:  [97.5 F (36.4 C)-99 F (37.2 C)] 98.6 F (37 C) (09/05 0726) Pulse Rate:  [57-75] 65 (09/05 0726) Resp:  [11-19] 18 (09/05 0459) BP: (94-130)/(56-86) 103/63 (09/05 0726) SpO2:  [91 %-100 %] 93 % (09/05 0726)  Intake/Output from previous day: 09/04 0701 - 09/05 0700 In: 2940 [I.V.:2440; IV Piggyback:500] Out: 1725 [Urine:1125; Blood:600] Intake/Output this shift: No intake/output data recorded.   Recent Labs  04/06/17 1640 04/07/17 0332  HGB 13.2 11.9*    Recent Labs  04/06/17 1640 04/07/17 0332  WBC 16.1* 10.9*  RBC 4.16* 3.73*  HCT 38.3* 33.7*  PLT 223 205    Recent Labs  04/06/17 1640 04/07/17 0332  NA  --  135  K  --  3.5  CL  --  104  CO2  --  24  BUN  --  14  CREATININE 0.64 0.67  GLUCOSE  --  131*  CALCIUM  --  8.3*   No results for input(s): LABPT, INR in the last 72 hours.  EXAM General - Patient is Alert, Appropriate and Oriented Extremity - Neurovascular intact Sensation intact distally Intact pulses distally Dorsiflexion/Plantar flexion intact No cellulitis present Compartment soft Dressing - dressing C/D/I, no drainage and wound vac intact, no drainage Motor Function - intact, moving foot and toes well on exam.   Past Medical History:  Diagnosis Date  . Anginal pain ([place])   . Coronary artery disease   . Depression   . Diabetes mellitus without complication ([place])   . GERD (gastroesophageal reflux disease)   . Hyperlipidemia   . Hypertension   . Hypogonadism in male   . Metabolic syndrome   . Myocardial infarction ([place])   . Orthopnea   .  Skin cancer 2014   nose and right knee  . Sleep apnea     Assessment/Plan:   1 Day Post-Op Procedure(s) (LRB): TOTAL HIP ARTHROPLASTY ANTERIOR APPROACH (Left) Active Problems:   Primary localized osteoarthritis of left hip   Acute post op blood loss anemia    Estimated body mass index is 33.23 kg/m as calculated from the following:   Height as of 03/31/17: 5\' 9"  (1.753 m).   Weight as of 03/31/17: 102.1 kg (225 lb). Advance diet Up with therapy  Needs BM Acute post op blood loss anemia - Hgb 11.9, recheck labs in the am CM to assist with discharge   DVT Prophylaxis - Lovenox, Foot Pumps and TED hose Weight-Bearing as tolerated to left leg   T. Rachelle Hora, PA-C [place] 04/07/2017, 8:05 AM

## 2017-04-08 LAB — SURGICAL PATHOLOGY

## 2017-04-08 LAB — CBC
HCT: 33.7 % — ABNORMAL LOW (ref 40.0–52.0)
Hemoglobin: 11.5 g/dL — ABNORMAL LOW (ref 13.0–18.0)
MCH: 31 pg (ref 26.0–34.0)
MCHC: 34.1 g/dL (ref 32.0–36.0)
MCV: 90.9 fL (ref 80.0–100.0)
Platelets: 204 10*3/uL (ref 150–440)
RBC: 3.71 MIL/uL — ABNORMAL LOW (ref 4.40–5.90)
RDW: 13.7 % (ref 11.5–14.5)
WBC: 13.8 10*3/uL — ABNORMAL HIGH (ref 3.8–10.6)

## 2017-04-08 LAB — GLUCOSE, CAPILLARY
GLUCOSE-CAPILLARY: 114 mg/dL — AB (ref 65–99)
Glucose-Capillary: 114 mg/dL — ABNORMAL HIGH (ref 65–99)
Glucose-Capillary: 92 mg/dL (ref 65–99)
Glucose-Capillary: 88 mg/dL (ref 65–99)

## 2017-04-08 LAB — BASIC METABOLIC PANEL
ANION GAP: 8 (ref 5–15)
BUN: 20 mg/dL (ref 6–20)
CO2: 27 mmol/L (ref 22–32)
Calcium: 8.7 mg/dL — ABNORMAL LOW (ref 8.9–10.3)
Chloride: 99 mmol/L — ABNORMAL LOW (ref 101–111)
Creatinine, Ser: 0.79 mg/dL (ref 0.61–1.24)
GFR calc Af Amer: >60 mL/min (ref >60)
GFR calc non Af Amer: >60 mL/min (ref >60)
GLUCOSE: 132 mg/dL — AB (ref 65–99)
POTASSIUM: 3.2 mmol/L — AB (ref 3.5–5.1)
Sodium: 134 mmol/L — ABNORMAL LOW (ref 135–145)

## 2017-04-08 MED ORDER — POTASSIUM CHLORIDE 20 MEQ PO PACK
20.0000 meq | PACK | Freq: Three times a day (TID) | ORAL | Status: AC
  Administered 2017-04-08 (×3): 20 meq via ORAL
  Filled 2017-04-08 (×3): qty 1

## 2017-04-08 NOTE — Care Management (Signed)
[facility] notified of change to snf.

## 2017-04-08 NOTE — Clinical Social Work Note (Addendum)
Clinical Social Work Assessment  Patient Details  Name: ALANSON HAUSMANN MRN: [id number] Date of Birth: Oct 10, 1950  Date of referral:  04/08/17               Reason for consult:  Facility Placement                Permission sought to share information with:  Chartered certified accountant granted to share information::  Yes, Verbal Permission Granted  Name::      [place]::   [place]   Relationship::     Contact Information:     Housing/Transportation Living arrangements for the past 2 months:  [place] of Information:  Patient, Spouse Patient Interpreter Needed:  None Criminal Activity/Legal Involvement Pertinent to Current Situation/Hospitalization:  No - Comment as needed Significant Relationships:  Spouse Lives with:  Spouse Do you feel safe going back to the place where you live?  Yes Need for family participation in patient care:  Yes (Comment)  Care giving concerns:  Patient lives in [place], [place] with his wife Thayer Headings.    Social Worker assessment / plan:  Holiday representative (CSW) received SNF consult. PT recommended home health however they changed the recommendation to SNF today. CSW met with patient alone at bedside to discuss D/C plan. Patient was alert and oriented X4 and was sitting up in the chair at bedside. CSW introduced self and explained role of CSW department. Patient reported that he lives in [place] with his wife Thayer Headings. CSW explained SNF process and that medicare requires a 3 night qualifying inpatient stay at a hospital in order to pay for SNF (patient was admitted to inpatient on 04/06/17). Patient verbalized his understanding and is agreeable to SNF search in [place] and [place]. Patient requested [facility].   Social Work Theatre manager presented bed offers to patient and he chose [facility]. CSW attempted to contact patient's wife Thayer Headings [phone] however she did not  answer and a voicemail was left. Tracie admissions coordinator at [facility] is aware of accepted bed offer. CSW will continue to follow and assist as needed.    Patient's wife Thayer Headings called CSW back and is in agreement with patient going to [facility].    Employment status:  Retired Forensic scientist:  Information systems manager, Medicaid In [place] PT Recommendations:  [place] / Referral to community resources:  [place]  Patient/Family's Response to care:  Patient is agreeable to go to [facility] for short term rehab.   Patient/Family's Understanding of and Emotional Response to Diagnosis, Current Treatment, and Prognosis:  Patient was very pleasant and thanked CSW for assistance.   Emotional Assessment Appearance:  Appears stated age Attitude/Demeanor/Rapport:    Affect (typically observed):  Accepting, Adaptable, Pleasant Orientation:  Oriented to Self, Oriented to Place, Oriented to  Time, Oriented to Situation Alcohol / Substance use:  Not Applicable Psych involvement (Current and /or in the community):  No (Comment)  Discharge Needs  Concerns to be addressed:  Discharge Planning Concerns Readmission within the last 30 days:  No Current discharge risk:  Dependent with Mobility Barriers to Discharge:  Continued Medical Work up   [employer], Veronia Beets, LCSW 04/08/2017, 2:18 PM

## 2017-04-08 NOTE — Progress Notes (Signed)
Physical Therapy Treatment Patient Details Name: Jimmy Green MRN: [id number] DOB: Apr 19, 1951 Today's Date: 04/08/2017    History of Present Illness Pt is a 66 y/o M s/p L THA, direct anterior approach.  Pt's PMH includes depression, back surgery, knee surgery, skin cancer, MI.    PT Comments    Pt c/o 7/10 L hip pain at rest and with activity ("burning pain") but decreased to 5/10 L hip pain end of session.  Pt requiring significant increased time and effort to perform bed mobility supine to sit and requiring min assist to stand with RW.  Able to ambulate 90 feet with RW and CGA but requiring extra time to perform (pt also demonstrating increased effort to advance L LE each repetition).  Will monitor pt's progress with functional mobility (may benefit from STR) and update discharge recommendations as appropriate next session.   Follow Up Recommendations  Home health PT;Supervision/Assistance - 24 hour     Equipment Recommendations  Rolling walker with 5" wheels;3in1 (PT)    Recommendations for Other Services       Precautions / Restrictions Precautions Precautions: Fall;Anterior Hip Precaution Comments: Wound vac Restrictions Weight Bearing Restrictions: Yes LLE Weight Bearing: Weight bearing as tolerated    Mobility  Bed Mobility Overal bed mobility: Needs Assistance Bed Mobility: Supine to Sit     Supine to sit: HOB elevated;Min guard     General bed mobility comments: significant increased effort and time to perform with HOB elevated and significant use of bed rail  Transfers Overall transfer level: Needs assistance Equipment used: Rolling walker (2 wheeled) Transfers: Sit to/from Stand Sit to Stand: Min guard;Min assist         General transfer comment: assist to initiate stand; significant increased effort to stand; assist to control descent  Ambulation/Gait Ambulation/Gait assistance: Min guard Ambulation Distance (Feet): 90 Feet Assistive device:  Rolling walker (2 wheeled)   Gait velocity: decreased   General Gait Details: initial significant difficulty advancing L LE but with repetition improved ability noted (although pt tending to lean to R and rotate L hip forward to advance L LE); UE's fatigued with distance   Stairs            Wheelchair Mobility    Modified Rankin (Stroke Patients Only)       Balance Overall balance assessment: Needs assistance Sitting-balance support: No upper extremity supported;Feet supported Sitting balance-Leahy Scale: Good Sitting balance - Comments: sitting reaching within BOS   Standing balance support: Bilateral upper extremity supported;During functional activity Standing balance-Leahy Scale: Poor Standing balance comment: requires UE support for ambulation balance                            Cognition Arousal/Alertness: Awake/alert Behavior During Therapy: WFL for tasks assessed/performed Overall Cognitive Status: Within Functional Limits for tasks assessed                                        Exercises Total Joint Exercises Ankle Circles/Pumps: AROM;Strengthening;Both;10 reps;Supine Quad Sets: AROM;Strengthening;Both;10 reps;Supine Short Arc Quad: AROM;Strengthening;Left;10 reps;Supine Heel Slides: AAROM;Strengthening;Left;10 reps;Supine Hip ABduction/ADduction: AAROM;Strengthening;Left;10 reps;Supine  Vc's required for technique of above ex's.    General Comments General comments (skin integrity, edema, etc.): Wound vac in place.  Attempted to see pt around 8:30 AM but pt reporting not feeling well and asking PT to come back later.  Attempted to see pt around 1100 AM but pt reporting recently returning from bathroom and was too fatigued to participate in therapy.  Pt agreeable to PT session when re-attempting PT session at 1145 AM.      Pertinent Vitals/Pain Pain Assessment: 0-10 Pain Score: 5  (7/10 beginning of session; 5/10 end of  session) Pain Location: L hip Pain Descriptors / Indicators: Burning Pain Intervention(s): Limited activity within patient's tolerance;Monitored during session;Premedicated before session;Repositioned    Home Living                      Prior Function            PT Goals (current goals can now be found in the care plan section) Acute Rehab PT Goals Patient Stated Goal: to have less pain PT Goal Formulation: With patient Time For Goal Achievement: 04/21/17 Potential to Achieve Goals: Good Progress towards PT goals: Progressing toward goals    Frequency    BID      PT Plan Current plan remains appropriate    Co-evaluation              AM-PAC PT "6 Clicks" Daily Activity  Outcome Measure  Difficulty turning over in bed (including adjusting bedclothes, sheets and blankets)?: A Lot Difficulty moving from lying on back to sitting on the side of the bed? : A Lot Difficulty sitting down on and standing up from a chair with arms (e.g., wheelchair, bedside commode, etc,.)?: Unable Help needed moving to and from a bed to chair (including a wheelchair)?: A Little Help needed walking in hospital room?: A Little Help needed climbing 3-5 steps with a railing? : A Lot 6 Click Score: 13    End of Session Equipment Utilized During Treatment: Gait belt;Other (comment) (wound vac) Activity Tolerance: Patient limited by pain;Patient limited by fatigue Patient left: in chair;with call bell/phone within reach;with chair alarm set;with family/visitor present;with SCD's reapplied (B heels elevated via pillow) Nurse Communication: Mobility status PT Visit Diagnosis: Pain;Unsteadiness on feet (R26.81);Muscle weakness (generalized) (M62.81);Other abnormalities of gait and mobility (R26.89) Pain - Right/Left: Left Pain - part of body: Hip     Time: 1610-9604 PT Time Calculation (min) (ACUTE ONLY): 40 min  Charges:  $Gait Training: 8-22 mins $Therapeutic Exercise: 8-22  mins $Therapeutic Activity: 8-22 mins                    G CodesLeitha Bleak, PT 04/08/17, 1:40 PM [phone]

## 2017-04-08 NOTE — Clinical Social Work Placement (Signed)
   CLINICAL SOCIAL WORK PLACEMENT  NOTE  Date:  04/08/2017  Patient Details  Name: Jimmy Green MRN: [id number] Date of Birth: 03/20/1951  Clinical Social Work is seeking post-discharge placement for this patient at the [place] level of care (*CSW will initial, date and re-position this form in  chart as items are completed):  Yes   Patient/family provided with [place] Work Department's list of facilities offering this level of care within the geographic area requested by the patient (or if unable, by the patient's family).  Yes   Patient/family informed of their freedom to choose among providers that offer the needed level of care, that participate in Medicare, Medicaid or managed care program needed by the patient, have an available bed and are willing to accept the patient.  Yes   Patient/family informed of [facility]'s ownership interest in [facility] and [facility], as well as of the fact that they are under no obligation to receive care at these facilities.  PASRR submitted to EDS on 04/06/17     PASRR number received on 04/06/17     Existing PASRR number confirmed on       FL2 transmitted to all facilities in geographic area requested by pt/family on 04/08/17     FL2 transmitted to all facilities within larger geographic area on       Patient informed that his/her managed care company has contracts with or will negotiate with certain facilities, including the following:            Patient/family informed of bed offers received.  Patient chooses bed at       Physician recommends and patient chooses bed at      Patient to be transferred to   on  .  Patient to be transferred to facility by       Patient family notified on   of transfer.  Name of family member notified:        PHYSICIAN       Additional Comment:    _______________________________________________ [place], Veronia Beets, LCSW 04/08/2017, 2:17 PM

## 2017-04-08 NOTE — Progress Notes (Signed)
Physical Therapy Treatment Patient Details Name: Jimmy Green MRN: [id number] DOB: [birth year] Today's Date: 04/08/2017    History of Present Illness Pt is a 66 y/o M s/p L THA, direct anterior approach.  Pt's PMH includes depression, back surgery, knee surgery, skin cancer, MI.    PT Comments    Pt reporting 4/10 L hip pain at rest but increased to 8/10 with ambulation.  Pt only able to ambulate 60 feet with RW this afternoon d/t L hip "burning" pain and UE fatigue (decreased distance ambulated compared to this morning).  Currently pt would benefit from discharge to STR to improve pt's strength, balance, gait (mechanics and distance), and increasing independence with functional mobility (SW notified).   Follow Up Recommendations  SNF     Equipment Recommendations  Rolling walker with 5" wheels;3in1 (PT)    Recommendations for Other Services       Precautions / Restrictions Precautions Precautions: Fall;Anterior Hip Precaution Comments: Wound vac Restrictions Weight Bearing Restrictions: Yes LLE Weight Bearing: Weight bearing as tolerated    Mobility  Bed Mobility Overal bed mobility: Needs Assistance Bed Mobility: Sit to Supine     Sit to supine: Min assist   General bed mobility comments: increased effort to perform; assist for L LE; vc's for positioning to scoot to L in bed; use of trapeze to boost self up in bed  Transfers Overall transfer level: Needs assistance Equipment used: Rolling walker (2 wheeled) Transfers: Sit to/from Stand Sit to Stand: Min assist         General transfer comment: assist to initiate stand; significant increased effort to stand; assist to control descent  Ambulation/Gait Ambulation/Gait assistance: Min guard Ambulation Distance (Feet): 60 Feet Assistive device: Rolling walker (2 wheeled)   Gait velocity: decreased   General Gait Details: initial significant difficulty advancing L LE (sliding on floor) but with repetition  improved ability noted (although pt tends to lean to R and rotate L hip forward to advance L LE requiring vc's for technique); UE's fatigued quickly with distance   Stairs            Wheelchair Mobility    Modified Rankin (Stroke Patients Only)       Balance Overall balance assessment: Needs assistance Sitting-balance support: No upper extremity supported;Feet supported Sitting balance-Leahy Scale: Good Sitting balance - Comments: sitting reaching within BOS   Standing balance support: Bilateral upper extremity supported;During functional activity Standing balance-Leahy Scale: Poor Standing balance comment: requires UE support for ambulation balance                            Cognition Arousal/Alertness: Awake/alert Behavior During Therapy: WFL for tasks assessed/performed Overall Cognitive Status: Within Functional Limits for tasks assessed                                        Exercises     General Comments General comments (skin integrity, edema, etc.): Wound vac in place.  Pt agreeable to PT session.      Pertinent Vitals/Pain Pain Assessment: 0-10 Pain Score: 8  (with ambulation 8/10; 4/10 at rest) Pain Location: L hip Pain Descriptors / Indicators: Burning Pain Intervention(s): Limited activity within patient's tolerance;Monitored during session;Repositioned;Patient requesting pain meds-RN notified;RN gave pain meds during session;Ice applied    Home Living  Prior Function            PT Goals (current goals can now be found in the care plan section) Acute Rehab PT Goals Patient Stated Goal: to have less pain PT Goal Formulation: With patient Time For Goal Achievement: 04/21/17 Potential to Achieve Goals: Good Progress towards PT goals: Progressing toward goals    Frequency    BID      PT Plan Current plan remains appropriate    Co-evaluation              AM-PAC PT "6  Clicks" Daily Activity  Outcome Measure  Difficulty turning over in bed (including adjusting bedclothes, sheets and blankets)?: A Lot Difficulty moving from lying on back to sitting on the side of the bed? : A Lot Difficulty sitting down on and standing up from a chair with arms (e.g., wheelchair, bedside commode, etc,.)?: Unable Help needed moving to and from a bed to chair (including a wheelchair)?: A Little Help needed walking in hospital room?: A Little Help needed climbing 3-5 steps with a railing? : A Lot 6 Click Score: 13    End of Session Equipment Utilized During Treatment: Gait belt;Other (comment) (wound vac) Activity Tolerance: Patient limited by pain;Patient limited by fatigue Patient left: in bed;with call bell/phone within reach;with bed alarm set;with SCD's reapplied (B heels elevated via pillow; ice pack on L hip) Nurse Communication: Mobility status PT Visit Diagnosis: Pain;Unsteadiness on feet (R26.81);Muscle weakness (generalized) (M62.81);Other abnormalities of gait and mobility (R26.89) Pain - Right/Left: Left Pain - part of body: Hip     Time: 1400-1430 PT Time Calculation (min) (ACUTE ONLY): 30 min  Charges:  $Gait Training: 8-22 mins $Therapeutic Activity: 8-22 mins                    G CodesLeitha Bleak, PT 04/08/17, 3:16 PM [phone]

## 2017-04-08 NOTE — Progress Notes (Signed)
   Subjective: 2 Days Post-Op Procedure(s) (LRB): TOTAL HIP ARTHROPLASTY ANTERIOR APPROACH (Left) Patient reports pain as moderate.  Improved from yesterday Patient is well, and has had no acute complaints or problems Denies any CP, SOB, ABD pain. We will continue therapy today.  Plan is to go Home after hospital stay.  Objective: Vital signs in last 24 hours: Temp:  [97.8 F (36.6 C)-98.7 F (37.1 C)] 97.8 F (36.6 C) (09/06 0725) Pulse Rate:  [75-77] 75 (09/06 0725) Resp:  [19] 19 (09/05 1939) BP: (112-126)/(62-64) 112/62 (09/06 0725) SpO2:  [89 %-90 %] 90 % (09/06 0725)  Intake/Output from previous day: 09/05 0701 - 09/06 0700 In: 120 [P.O.:120] Out: 850 [Urine:450; Drains:400] Intake/Output this shift: No intake/output data recorded.   Recent Labs  04/06/17 1640 04/07/17 0332 04/08/17 0351  HGB 13.2 11.9* 11.5*    Recent Labs  04/07/17 0332 04/08/17 0351  WBC 10.9* 13.8*  RBC 3.73* 3.71*  HCT 33.7* 33.7*  PLT 205 204    Recent Labs  04/07/17 0332 04/08/17 0351  NA 135 134*  K 3.5 3.2*  CL 104 99*  CO2 24 27  BUN 14 20  CREATININE 0.67 0.79  GLUCOSE 131* 132*  CALCIUM 8.3* 8.7*   No results for input(s): LABPT, INR in the last 72 hours.  EXAM General - Patient is Alert, Appropriate and Oriented Extremity - Neurovascular intact Sensation intact distally Intact pulses distally Dorsiflexion/Plantar flexion intact No cellulitis present Compartment soft Dressing - dressing C/D/I, no drainage and wound vac intact with out drainage Motor Function - intact, moving foot and toes well on exam.   Past Medical History:  Diagnosis Date  . Anginal pain ([place])   . Coronary artery disease   . Depression   . Diabetes mellitus without complication ([place])   . GERD (gastroesophageal reflux disease)   . Hyperlipidemia   . Hypertension   . Hypogonadism in male   . Metabolic syndrome   . Myocardial infarction ([place])   . Orthopnea   . Skin cancer 2014   nose and right knee  . Sleep apnea     Assessment/Plan:   2 Days Post-Op Procedure(s) (LRB): TOTAL HIP ARTHROPLASTY ANTERIOR APPROACH (Left) Active Problems:   Primary localized osteoarthritis of left hip   .hypokalemia   Estimated body mass index is 33.23 kg/m as calculated from the following:   Height as of 03/31/17: 5\' 9"  (1.753 m).   Weight as of 03/31/17: 102.1 kg (225 lb). Advance diet Up with therapy Hypokalemia - Klor Kon 20 meq TID, recheck labs in the am Needs BM CM to assist with discharge home, most likely will discharge home tomorrow  DVT Prophylaxis - Lovenox, Foot Pumps and TED hose Weight-Bearing as tolerated to left leg   T. Rachelle Hora, PA-C [place] 04/08/2017, 8:05 AM

## 2017-04-09 DIAGNOSIS — Z741 Need for assistance with personal care: Secondary | ICD-10-CM | POA: Diagnosis not present

## 2017-04-09 DIAGNOSIS — Z7401 Bed confinement status: Secondary | ICD-10-CM | POA: Diagnosis not present

## 2017-04-09 DIAGNOSIS — M1990 Unspecified osteoarthritis, unspecified site: Secondary | ICD-10-CM | POA: Diagnosis not present

## 2017-04-09 DIAGNOSIS — Z23 Encounter for immunization: Secondary | ICD-10-CM | POA: Diagnosis not present

## 2017-04-09 DIAGNOSIS — I10 Essential (primary) hypertension: Secondary | ICD-10-CM | POA: Diagnosis not present

## 2017-04-09 DIAGNOSIS — R2689 Other abnormalities of gait and mobility: Secondary | ICD-10-CM | POA: Diagnosis not present

## 2017-04-09 DIAGNOSIS — Z96642 Presence of left artificial hip joint: Secondary | ICD-10-CM | POA: Diagnosis not present

## 2017-04-09 DIAGNOSIS — M1612 Unilateral primary osteoarthritis, left hip: Secondary | ICD-10-CM | POA: Diagnosis not present

## 2017-04-09 DIAGNOSIS — M25559 Pain in unspecified hip: Secondary | ICD-10-CM | POA: Diagnosis not present

## 2017-04-09 DIAGNOSIS — M6281 Muscle weakness (generalized): Secondary | ICD-10-CM | POA: Diagnosis not present

## 2017-04-09 DIAGNOSIS — I2510 Atherosclerotic heart disease of native coronary artery without angina pectoris: Secondary | ICD-10-CM | POA: Diagnosis not present

## 2017-04-09 DIAGNOSIS — Z471 Aftercare following joint replacement surgery: Secondary | ICD-10-CM | POA: Diagnosis not present

## 2017-04-09 LAB — CBC
HEMATOCRIT: 30.7 % — AB (ref 40.0–52.0)
Hemoglobin: 10.6 g/dL — ABNORMAL LOW (ref 13.0–18.0)
MCH: 31.2 pg (ref 26.0–34.0)
MCHC: 34.6 g/dL (ref 32.0–36.0)
MCV: 90.4 fL (ref 80.0–100.0)
Platelets: 207 10*3/uL (ref 150–440)
RBC: 3.39 MIL/uL — ABNORMAL LOW (ref 4.40–5.90)
RDW: 13.7 % (ref 11.5–14.5)
WBC: 10.5 10*3/uL (ref 3.8–10.6)

## 2017-04-09 LAB — BASIC METABOLIC PANEL
Anion gap: 6 (ref 5–15)
BUN: 22 mg/dL — AB (ref 6–20)
CHLORIDE: 100 mmol/L — AB (ref 101–111)
CO2: 29 mmol/L (ref 22–32)
CREATININE: 0.79 mg/dL (ref 0.61–1.24)
Calcium: 8.7 mg/dL — ABNORMAL LOW (ref 8.9–10.3)
GFR calc Af Amer: >60 mL/min (ref >60)
GFR calc non Af Amer: >60 mL/min (ref >60)
GLUCOSE: 112 mg/dL — AB (ref 65–99)
POTASSIUM: 3.3 mmol/L — AB (ref 3.5–5.1)
Sodium: 135 mmol/L (ref 135–145)

## 2017-04-09 LAB — GLUCOSE, CAPILLARY
GLUCOSE-CAPILLARY: 111 mg/dL — AB (ref 65–99)
Glucose-Capillary: 105 mg/dL — ABNORMAL HIGH (ref 65–99)

## 2017-04-09 MED ORDER — OXYCODONE HCL 5 MG PO TABS: 5.0000 mg | ORAL_TABLET | ORAL | 0 refills | Status: DC | PRN

## 2017-04-09 MED ORDER — METHOCARBAMOL 500 MG PO TABS: 500.0000 mg | ORAL_TABLET | Freq: Four times a day (QID) | ORAL | 0 refills | Status: DC | PRN

## 2017-04-09 MED ORDER — ENOXAPARIN SODIUM 40 MG/0.4ML ~~LOC~~ SOLN: 40.0000 mg | SUBCUTANEOUS | 0 refills | Status: DC

## 2017-04-09 MED ORDER — BISACODYL 10 MG RE SUPP: 10.0000 mg | Freq: Every day | RECTAL | 0 refills | Status: DC | PRN

## 2017-04-09 MED ORDER — POTASSIUM CHLORIDE 20 MEQ PO PACK
20.0000 meq | PACK | Freq: Two times a day (BID) | ORAL | Status: DC
  Administered 2017-04-09: 20 meq via ORAL
  Filled 2017-04-09: qty 1

## 2017-04-09 MED ORDER — DOCUSATE SODIUM 100 MG PO CAPS: 100.0000 mg | ORAL_CAPSULE | Freq: Two times a day (BID) | ORAL | 0 refills | Status: DC

## 2017-04-09 MED ORDER — TRAMADOL HCL 50 MG PO TABS: 50.0000 mg | ORAL_TABLET | ORAL | 0 refills | Status: DC | PRN

## 2017-04-09 NOTE — Progress Notes (Signed)
Pt ready for discharge to [facility]. Report Given to [employer], Therapist, sports. Pt will be transported VIA EMS - called for pickup.

## 2017-04-09 NOTE — Clinical Social Work Placement (Signed)
   CLINICAL SOCIAL WORK PLACEMENT  NOTE  Date:  04/09/2017  Patient Details  Name: Jimmy Green MRN: [id number] Date of Birth: 12-21-1950  Clinical Social Work is seeking post-discharge placement for this patient at the [place] level of care (*CSW will initial, date and re-position this form in  chart as items are completed):  Yes   Patient/family provided with [place] Work Department's list of facilities offering this level of care within the geographic area requested by the patient (or if unable, by the patient's family).  Yes   Patient/family informed of their freedom to choose among providers that offer the needed level of care, that participate in Medicare, Medicaid or managed care program needed by the patient, have an available bed and are willing to accept the patient.  Yes   Patient/family informed of [facility]'s ownership interest in [facility] and [facility], as well as of the fact that they are under no obligation to receive care at these facilities.  PASRR submitted to EDS on 04/06/17     PASRR number received on 04/06/17     Existing PASRR number confirmed on       FL2 transmitted to all facilities in geographic area requested by pt/family on 04/08/17     FL2 transmitted to all facilities within larger geographic area on       Patient informed that his/her managed care company has contracts with or will negotiate with certain facilities, including the following:        Yes   Patient/family informed of bed offers received.  Patient chooses bed at  [facility]. )     Physician recommends and patient chooses bed at      Patient to be transferred to  [facility]. ) on 04/09/17.  Patient to be transferred to facility by  [facility] )     Patient family notified on 04/09/17 of transfer.  Name of family member notified:   ([place] left patient's wife Thayer Headings a voicemail making her aware of D/C today. )      PHYSICIAN       Additional Comment:    _______________________________________________ Woodroe Vogan, Veronia Beets, LCSW 04/09/2017, 12:33 PM

## 2017-04-09 NOTE — Progress Notes (Signed)
Patient is medically stable for D/C to [facility] today. Per Tracie admissions coordinator at [facility] patient can come today to room 501. RN will call report and arrange EMS for transport. Clinical Education officer, museum (CSW) sent D/C orders to [employer] via [place]. Patient is aware of above. CSW attempted to contact patient's wife Thayer Headings however she did not answer and a voicemail was left. Please reconsult if future social work needs arise. CSW signing off.   [employer], LCSW [phone]

## 2017-04-09 NOTE — Progress Notes (Signed)
   Subjective: 3 Days Post-Op Procedure(s) (LRB): TOTAL HIP ARTHROPLASTY ANTERIOR APPROACH (Left) Patient reports pain as mild.   Patient is well, and has had no acute complaints or problems Denies any CP, SOB, ABD pain. We will continue therapy today.  Plan is to go Home after hospital stay.  Objective: Vital signs in last 24 hours: Temp:  [99.8 F (37.7 C)] 99.8 F (37.7 C) (09/06 2101) Pulse Rate:  [81] 81 (09/06 2101) Resp:  [19] 19 (09/06 2101) BP: (115)/(68) 115/68 (09/06 2101) SpO2:  [89 %] 89 % (09/06 2101) Weight:  [103.2 kg (227 lb 8 oz)] 103.2 kg (227 lb 8 oz) (09/06 1900)  Intake/Output from previous day: 09/06 0701 - 09/07 0700 In: -  Out: 200 [Urine:200] Intake/Output this shift: Total I/O In: -  Out: 200 [Urine:200]   Recent Labs  04/06/17 1640 04/07/17 0332 04/08/17 0351 04/09/17 0403  HGB 13.2 11.9* 11.5* 10.6*    Recent Labs  04/08/17 0351 04/09/17 0403  WBC 13.8* 10.5  RBC 3.71* 3.39*  HCT 33.7* 30.7*  PLT 204 207    Recent Labs  04/08/17 0351 04/09/17 0403  NA 134* 135  K 3.2* 3.3*  CL 99* 100*  CO2 27 29  BUN 20 22*  CREATININE 0.79 0.79  GLUCOSE 132* 112*  CALCIUM 8.7* 8.7*   No results for input(s): LABPT, INR in the last 72 hours.  EXAM General - Patient is Alert, Appropriate and Oriented Extremity - Neurovascular intact Sensation intact distally Intact pulses distally Dorsiflexion/Plantar flexion intact No cellulitis present Compartment soft Dressing - dressing C/D/I, no drainage and wound vac intact with out drainage Motor Function - intact, moving foot and toes well on exam.   Past Medical History:  Diagnosis Date  . Anginal pain ([place])   . Coronary artery disease   . Depression   . Diabetes mellitus without complication ([place])   . GERD (gastroesophageal reflux disease)   . Hyperlipidemia   . Hypertension   . Hypogonadism in male   . Metabolic syndrome   . Myocardial infarction ([place])   . Orthopnea   .  Skin cancer 2014   nose and right knee  . Sleep apnea     Assessment/Plan:   3 Days Post-Op Procedure(s) (LRB): TOTAL HIP ARTHROPLASTY ANTERIOR APPROACH (Left) Active Problems:   Primary localized osteoarthritis of left hip   .hypokalemia   Estimated body mass index is 33.6 kg/m as calculated from the following:   Height as of this encounter: 5\' 9"  (1.753 m).   Weight as of this encounter: 103.2 kg (227 lb 8 oz). Advance diet Up with therapy Hypokalemia - Improved, continue with Klor kon Discharge to SNF today Follow up with [facility] in 2 weeks  DVT Prophylaxis - Lovenox, Foot Pumps and TED hose Weight-Bearing as tolerated to left leg   T. Rachelle Hora, PA-C [place] 04/09/2017, 8:14 AM

## 2017-04-09 NOTE — Progress Notes (Signed)
Physical Therapy Treatment Patient Details Name: Jimmy Green MRN: [id number] DOB: [birth year] Today's Date: 04/09/2017    History of Present Illness Pt is a 66 y/o M s/p L THA, direct anterior approach.  Pt's PMH includes depression, back surgery, knee surgery, skin cancer, MI.    PT Comments    Pt reporting improved L hip pain today at rest (4/10 beginning of session and 3/10 end of session sitting in chair) but reporting 8/10 L hip "burning" pain with ambulation.  Ambulation distance limited to 40 feet today (decreased from yesterday) d/t L hip pain and UE fatigue.  Will continue to focus on strengthening, balance, and progressive functional mobility per pt tolerance.    Follow Up Recommendations  SNF     Equipment Recommendations  Rolling walker with 5" wheels;3in1 (PT)    Recommendations for Other Services       Precautions / Restrictions Precautions Precautions: Fall;Anterior Hip Precaution Comments: Wound vac Restrictions Weight Bearing Restrictions: Yes LLE Weight Bearing: Weight bearing as tolerated    Mobility  Bed Mobility               General bed mobility comments: Deferred (pt sitting up in chair beginning and end of session)  Transfers Overall transfer level: Needs assistance Equipment used: Rolling walker (2 wheeled) Transfers: Sit to/from Stand Sit to Stand: Min assist         General transfer comment: assist to initiate stand; increased effort to stand; assist to control descent  Ambulation/Gait Ambulation/Gait assistance: Min guard Ambulation Distance (Feet): 40 Feet Assistive device: Rolling walker (2 wheeled)   Gait velocity: decreased   General Gait Details: pt initially rotating L LE inward and rotating trunk/hip forward to advance L LE but improved with vc's for technique (mild trunk/hip rotation use noted though)   Stairs            Wheelchair Mobility    Modified Rankin (Stroke Patients Only)       Balance  Overall balance assessment: Needs assistance Sitting-balance support: No upper extremity supported;Feet supported Sitting balance-Leahy Scale: Good Sitting balance - Comments: sitting reaching within BOS   Standing balance support: Bilateral upper extremity supported;During functional activity Standing balance-Leahy Scale: Poor Standing balance comment: requires UE support for ambulation balance                            Cognition Arousal/Alertness: Awake/alert Behavior During Therapy: WFL for tasks assessed/performed Overall Cognitive Status: Within Functional Limits for tasks assessed                                        Exercises Total Joint Exercises Ankle Circles/Pumps: AROM;Strengthening;Both;10 reps;Supine Quad Sets: AROM;Strengthening;Both;10 reps;Supine Long Arc Quad: Strengthening;AROM;Right;AAROM;Left;10 reps;Seated General Exercises - Lower Extremity Hip Flexion/Marching: Strengthening;AROM;Right;AAROM;Left;10 reps;Seated  Pt requires vc's for technique of above ex's.    General Comments General comments (skin integrity, edema, etc.): Wound vac in place  Nursing cleared pt for participation in physical therapy.  Pt agreeable to PT session.      Pertinent Vitals/Pain Pain Assessment: 0-10 Pain Score: 3  (4/10 at rest beginning of session; 8/10 with ambulation; 3/10 end of session) Pain Location: L hip Pain Descriptors / Indicators: Burning Pain Intervention(s): Limited activity within patient's tolerance;Monitored during session;Premedicated before session;Repositioned    Home Living  Prior Function            PT Goals (current goals can now be found in the care plan section) Acute Rehab PT Goals Patient Stated Goal: to have less pain PT Goal Formulation: With patient Time For Goal Achievement: 04/21/17 Potential to Achieve Goals: Good Progress towards PT goals: Progressing toward goals     Frequency    BID      PT Plan Current plan remains appropriate    Co-evaluation              AM-PAC PT "6 Clicks" Daily Activity  Outcome Measure  Difficulty turning over in bed (including adjusting bedclothes, sheets and blankets)?: A Lot Difficulty moving from lying on back to sitting on the side of the bed? : A Lot Difficulty sitting down on and standing up from a chair with arms (e.g., wheelchair, bedside commode, etc,.)?: Unable Help needed moving to and from a bed to chair (including a wheelchair)?: A Little Help needed walking in hospital room?: A Little Help needed climbing 3-5 steps with a railing? : A Lot 6 Click Score: 13    End of Session Equipment Utilized During Treatment: Gait belt;Other (comment) (wound vac) Activity Tolerance: Patient limited by pain;Patient limited by fatigue Patient left: in chair;with call bell/phone within reach;with chair alarm set;with SCD's reapplied (B heels elevated via pillow) Nurse Communication: Mobility status;Precautions PT Visit Diagnosis: Pain;Unsteadiness on feet (R26.81);Muscle weakness (generalized) (M62.81);Other abnormalities of gait and mobility (R26.89) Pain - Right/Left: Left Pain - part of body: Hip     Time: 4166-0630 PT Time Calculation (min) (ACUTE ONLY): 29 min  Charges:  $Gait Training: 8-22 mins $Therapeutic Exercise: 8-22 mins                    G CodesLeitha Bleak, PT 04/09/17, 9:50 AM [phone]

## 2017-04-09 NOTE — Discharge Summary (Signed)
Physician Discharge Summary  Patient ID: Jimmy Green MRN: [id number] DOB/AGE: 10/31/1950 66 y.o.  Admit date: 04/06/2017 Discharge date: 04/09/2017  Admission Diagnoses:  PRIMARY OSTEOARTHRITIS OF LEFT HIP   Discharge Diagnoses: Patient Active Problem List   Diagnosis Date Noted  . Primary localized osteoarthritis of left hip 04/06/2017  . Dyslipidemia associated with type 2 diabetes mellitus ([place]) 10/04/2016  . Trigger thumb of both thumbs 06/02/2016  . Chronic tension-type headache, intractable 09/25/2015  . Benign paroxysmal positional vertigo due to bilateral vestibular disorder 09/25/2015  . Hearing loss sensory, bilateral 08/26/2015  . Cerebral microvascular disease 08/26/2015  . Metabolic syndrome 82/42/3536  . Vertigo 06/21/2015  . Buzzing in ear 06/21/2015  . Hypertension goal BP (blood pressure) < 140/90 03/28/2015  . GERD (gastroesophageal reflux disease) 03/28/2015  . Hyperlipidemia 03/28/2015  . Depression, major 03/28/2015  . CAD in native artery 03/28/2015  . Actinic keratosis 02/14/2015  . Obstructive sleep apnea 02/03/2015  . Diverticulosis of colon without hemorrhage 01/31/2015  . Hx of colonic polyps   . Benign neoplasm of descending colon   . Benign neoplasm of sigmoid colon   . Idiopathic colitis   . Sebaceous cyst 09/14/2014    Past Medical History:  Diagnosis Date  . Anginal pain ([place])   . Coronary artery disease   . Depression   . Diabetes mellitus without complication ([place])   . GERD (gastroesophageal reflux disease)   . Hyperlipidemia   . Hypertension   . Hypogonadism in male   . Metabolic syndrome   . Myocardial infarction ([place])   . Orthopnea   . Skin cancer 2014   nose and right knee  . Sleep apnea      Transfusion: none   Consultants (if any):   Discharged Condition: Improved  Hospital Course: Jimmy Green is an 66 y.o. male who was admitted 04/06/2017 with a diagnosis of left hip osteoarthritis  and went to the operating  room on 04/06/2017 and underwent the above named procedures.    Surgeries: Procedure(s): TOTAL HIP ARTHROPLASTY ANTERIOR APPROACH on 04/06/2017 Patient tolerated the surgery well. Taken to PACU where she was stabilized and then transferred to the orthopedic floor.  Started on Lovenox 40 q 24 hrs. Foot pumps applied bilaterally at 80 mm. Heels elevated on bed with rolled towels. No evidence of DVT. Negative [place]. Physical therapy started on day #1 for gait training and transfer. OT started day #1 for ADL and assisted devices.  Patient's foley was d/c on day #1. Patient's IV was d/c on day #2.  On post op day #3 patient was stable and ready for discharge to SNF.  Remove wound vac on 04/16/17 and apply honey comb dressing.   Implants: Medacta Mpact 56 DM with liner DePuy Actis 5 high offset stem with +5 mm 28 mm metal head  He was given perioperative antibiotics:  Anti-infectives    Start     Dose/Rate Route Frequency Ordered Stop   04/06/17 1630  ceFAZolin (ANCEF) IVPB 2g/100 mL premix     2 g 200 mL/hr over 30 Minutes Intravenous Every 6 hours 04/06/17 1532 04/07/17 0414   04/06/17 0900  vancomycin (VANCOCIN) IVPB 1000 mg/200 mL premix     1,000 mg 200 mL/hr over 60 Minutes Intravenous  Once 04/06/17 0854 04/06/17 0956   04/06/17 0733  ceFAZolin (ANCEF) 2-4 GM/100ML-% IVPB    Comments:  [place]   : cabinet override      04/06/17 0733 04/06/17 1026   04/05/17  2230  ceFAZolin (ANCEF) IVPB 2g/100 mL premix     2 g 200 mL/hr over 30 Minutes Intravenous  Once 04/05/17 2223 04/06/17 1026    .  He was given sequential compression devices, early ambulation, and Lovenox for DVT prophylaxis.  He benefited maximally from the hospital stay and there were no complications.    Recent vital signs:  Vitals:   04/08/17 0725 04/08/17 2101  BP: 112/62 115/68  Pulse: 75 81  Resp:  19  Temp: 97.8 F (36.6 C) 99.8 F (37.7 C)  SpO2: 90% (!) 89%    Recent laboratory studies:  Lab  Results  Component Value Date   HGB 10.6 (L) 04/09/2017   HGB 11.5 (L) 04/08/2017   HGB 11.9 (L) 04/07/2017   Lab Results  Component Value Date   WBC 10.5 04/09/2017   PLT 207 04/09/2017   Lab Results  Component Value Date   INR 1.15 03/31/2017   Lab Results  Component Value Date   NA 135 04/09/2017   K 3.3 (L) 04/09/2017   CL 100 (L) 04/09/2017   CO2 29 04/09/2017   BUN 22 (H) 04/09/2017   CREATININE 0.79 04/09/2017   GLUCOSE 112 (H) 04/09/2017    Discharge Medications:   Allergies as of 04/09/2017      Reactions   Elavil [amitriptyline Hcl] Rash      Medication List    TAKE these medications   ACCU-CHEK AVIVA PLUS w/Device Kit 1 application by Does not apply route daily.   accu-chek soft touch lancets Use as instructed   amLODipine 2.5 MG tablet Commonly known as:  NORVASC Take 1 tablet (2.5 mg total) by mouth daily.   aspirin EC 81 MG tablet Take 81 mg by mouth every morning.   atorvastatin 40 MG tablet Commonly known as:  LIPITOR Take 1 tablet (40 mg total) by mouth daily.   bisacodyl 10 MG suppository Commonly known as:  DULCOLAX Place 1 suppository (10 mg total) rectally daily as needed for moderate constipation.   buPROPion 150 MG 24 hr tablet Commonly known as:  WELLBUTRIN XL Take 3 tablets (450 mg total) by mouth daily.   butalbital-aspirin-caffeine 50-325-40 MG capsule Commonly known as:  FIORINAL Take 1 capsule by mouth every 6 (six) hours as needed for headache.   docusate sodium 100 MG capsule Commonly known as:  COLACE Take 1 capsule (100 mg total) by mouth 2 (two) times daily.   enoxaparin 40 MG/0.4ML injection Commonly known as:  LOVENOX Inject 0.4 mLs (40 mg total) into the skin daily.   escitalopram 20 MG tablet Commonly known as:  LEXAPRO Take 1 tablet (20 mg total) by mouth daily.   glucose blood test strip Commonly known as:  ACCU-CHEK ACTIVE STRIPS Use as instructed   losartan-hydrochlorothiazide 100-25 MG  tablet Commonly known as:  HYZAAR Take 1 tablet by mouth daily.   metFORMIN 500 MG 24 hr tablet Commonly known as:  GLUCOPHAGE-XR Take 1 tablet (500 mg total) by mouth daily with breakfast.   methocarbamol 500 MG tablet Commonly known as:  ROBAXIN Take 1 tablet (500 mg total) by mouth every 6 (six) hours as needed for muscle spasms.   metoprolol succinate 50 MG 24 hr tablet Commonly known as:  TOPROL-XL TAKE 1 TABLET BY MOUTH EVERY EVENING. What changed:  See the new instructions.   nabumetone 750 MG tablet Commonly known as:  RELAFEN Take 750 mg by mouth 2 (two) times daily as needed for mild pain.   nitroGLYCERIN  0.4 MG SL tablet Commonly known as:  NITROSTAT Place 1 tablet (0.4 mg total) under the tongue every 5 (five) minutes as needed. For chest pain.   Omeprazole 20 MG Tbec TAKE 1 TABLET (20 MG TOTAL) BY MOUTH DAILY.   oxyCODONE 5 MG immediate release tablet Commonly known as:  Oxy IR/ROXICODONE Take 1-2 tablets (5-10 mg total) by mouth every 4 (four) hours as needed for breakthrough pain.   traMADol 50 MG tablet Commonly known as:  ULTRAM Take 1 tablet (50 mg total) by mouth every 4 (four) hours as needed for moderate pain.            Discharge Care Instructions        Start     Ordered   04/10/17 0000  enoxaparin (LOVENOX) 40 MG/0.4ML injection  Every 24 hours    Question:  Supervising Provider  Answer:  Hessie Knows   04/09/17 0920   04/09/17 0000  bisacodyl (DULCOLAX) 10 MG suppository  Daily PRN    Question:  Supervising Provider  Answer:  Hessie Knows   04/09/17 0920   04/09/17 0000  docusate sodium (COLACE) 100 MG capsule  2 times daily    Question:  Supervising Provider  Answer:  Hessie Knows   04/09/17 0920   04/09/17 0000  methocarbamol (ROBAXIN) 500 MG tablet  Every 6 hours PRN    Question:  Supervising Provider  Answer:  Hessie Knows   04/09/17 0920   04/09/17 0000  oxyCODONE (OXY IR/ROXICODONE) 5 MG immediate release tablet  Every 4  hours PRN    Question:  Supervising Provider  Answer:  Hessie Knows   04/09/17 0920   04/09/17 0000  traMADol (ULTRAM) 50 MG tablet  Every 4 hours PRN    Question:  Supervising Provider  Answer:  Hessie Knows   04/09/17 0920      Diagnostic Studies: Dg Hip Operative Unilat W Or W/o Pelvis Left  Result Date: 04/06/2017 CLINICAL DATA:  Left total hip splint EXAM: OPERATIVE LEFT HIP (WITH PELVIS IF PERFORMED) 1 VIEWS TECHNIQUE: Fluoroscopic spot image(s) were submitted for interpretation post-operatively. COMPARISON:  07/02/2016 FINDINGS: Changes of left hip replacement. Normal AP alignment. No visible complicating feature. IMPRESSION: Left hip replacement.  No visible complicating feature. Electronically Signed   By: Rolm Baptise M.D.   On: 04/06/2017 12:16   Dg Hip Unilat W Or W/o Pelvis 2-3 Views Left  Result Date: 04/06/2017 CLINICAL DATA:  Total hip replacement EXAM: DG HIP (WITH OR WITHOUT PELVIS) 2-3V LEFT COMPARISON:  07/02/2016 FINDINGS: Left total hip arthroplasty without periprosthetic fracture or dislocation. IMPRESSION: No acute finding after total hip arthroplasty. Electronically Signed   By: Monte Fantasia M.D.   On: 04/06/2017 13:14    Disposition: 01-Home or Self Care     Contact information for follow-up providers    Hessie Knows, MD. Call in 2 week(s).   Specialty:  Orthopedic Surgery Contact information: [place] [address] [phone]            Contact information for after-discharge care    Destination    [facility] Follow up.   Specialty:  [place] information: [address] [place] [phone]                   Signed: Feliberto Gottron 04/09/2017, 9:21 AM

## 2017-04-09 NOTE — Discharge Instructions (Signed)

## 2017-04-09 NOTE — Care Management Important Message (Signed)
Important Message  Patient Details  Name: Jimmy Green MRN: [id number] Date of Birth: 12-25-1950   Medicare Important Message Given:  Yes    Jolly Mango, RN 04/09/2017, 2:28 PM

## 2017-04-12 DIAGNOSIS — R2689 Other abnormalities of gait and mobility: Secondary | ICD-10-CM | POA: Diagnosis not present

## 2017-04-14 DIAGNOSIS — R2689 Other abnormalities of gait and mobility: Secondary | ICD-10-CM | POA: Diagnosis not present

## 2017-04-15 DIAGNOSIS — I2510 Atherosclerotic heart disease of native coronary artery without angina pectoris: Secondary | ICD-10-CM | POA: Diagnosis not present

## 2017-04-15 DIAGNOSIS — I10 Essential (primary) hypertension: Secondary | ICD-10-CM | POA: Diagnosis not present

## 2017-04-15 DIAGNOSIS — Z96642 Presence of left artificial hip joint: Secondary | ICD-10-CM | POA: Diagnosis not present

## 2017-04-15 DIAGNOSIS — M1990 Unspecified osteoarthritis, unspecified site: Secondary | ICD-10-CM | POA: Diagnosis not present

## 2017-04-16 DIAGNOSIS — R2689 Other abnormalities of gait and mobility: Secondary | ICD-10-CM | POA: Diagnosis not present

## 2017-04-21 ENCOUNTER — Other Ambulatory Visit: Payer: Self-pay | Admitting: Family Medicine

## 2017-04-21 ENCOUNTER — Ambulatory Visit (INDEPENDENT_AMBULATORY_CARE_PROVIDER_SITE_OTHER): Payer: Medicare Other

## 2017-04-21 DIAGNOSIS — E539 Vitamin B deficiency, unspecified: Secondary | ICD-10-CM

## 2017-04-21 DIAGNOSIS — K219 Gastro-esophageal reflux disease without esophagitis: Secondary | ICD-10-CM

## 2017-04-21 MED ORDER — CYANOCOBALAMIN 1000 MCG/ML IJ SOLN
1000.0000 ug | Freq: Once | INTRAMUSCULAR | Status: AC
  Administered 2017-04-21: 1000 ug via INTRAMUSCULAR

## 2017-05-11 ENCOUNTER — Ambulatory Visit (INDEPENDENT_AMBULATORY_CARE_PROVIDER_SITE_OTHER): Payer: Medicare Other | Admitting: Family Medicine

## 2017-05-11 ENCOUNTER — Encounter: Payer: Self-pay | Admitting: Family Medicine

## 2017-05-11 VITALS — BP 118/76 | HR 76 | Temp 98.2°F | Resp 16 | Ht 66.93 in | Wt 211.4 lb

## 2017-05-11 DIAGNOSIS — Z0000 Encounter for general adult medical examination without abnormal findings: Secondary | ICD-10-CM | POA: Diagnosis not present

## 2017-05-11 DIAGNOSIS — Z23 Encounter for immunization: Secondary | ICD-10-CM

## 2017-05-11 DIAGNOSIS — F330 Major depressive disorder, recurrent, mild: Secondary | ICD-10-CM

## 2017-05-11 DIAGNOSIS — E1169 Type 2 diabetes mellitus with other specified complication: Secondary | ICD-10-CM

## 2017-05-11 DIAGNOSIS — E785 Hyperlipidemia, unspecified: Secondary | ICD-10-CM

## 2017-05-11 DIAGNOSIS — I200 Unstable angina: Secondary | ICD-10-CM | POA: Diagnosis not present

## 2017-05-11 DIAGNOSIS — I10 Essential (primary) hypertension: Secondary | ICD-10-CM | POA: Diagnosis not present

## 2017-05-11 DIAGNOSIS — E782 Mixed hyperlipidemia: Secondary | ICD-10-CM | POA: Diagnosis not present

## 2017-05-11 DIAGNOSIS — G4733 Obstructive sleep apnea (adult) (pediatric): Secondary | ICD-10-CM | POA: Diagnosis not present

## 2017-05-11 MED ORDER — LOSARTAN POTASSIUM-HCTZ 100-25 MG PO TABS: 1.0000 | ORAL_TABLET | Freq: Every day | ORAL | 3 refills | Status: DC

## 2017-05-11 MED ORDER — AMLODIPINE BESYLATE 2.5 MG PO TABS: 2.5000 mg | ORAL_TABLET | Freq: Every day | ORAL | 3 refills | Status: DC

## 2017-05-11 MED ORDER — ATORVASTATIN CALCIUM 40 MG PO TABS: 40.0000 mg | ORAL_TABLET | Freq: Every day | ORAL | 3 refills | Status: DC

## 2017-05-11 MED ORDER — METFORMIN HCL ER 500 MG PO TB24: 500.0000 mg | ORAL_TABLET | Freq: Every day | ORAL | 3 refills | Status: DC

## 2017-05-11 NOTE — Progress Notes (Deleted)
Name: Jimmy Green   MRN: [id number]    DOB: 04/10/1951   Date:05/11/2017       Progress Note  Subjective  Chief Complaint  Chief Complaint  Patient presents with  . Welcome to Medicare    HPI  *** Patient Active Problem List   Diagnosis Date Noted  . Primary localized osteoarthritis of left hip 04/06/2017  . Dyslipidemia associated with type 2 diabetes mellitus (H[facility]) 10/04/2016  . Trigger thumb of both thumbs 06/02/2016  . Chronic tension-type headache, intractable 09/25/2015  . Benign paroxysmal positional vertigo due to bilateral vestibular disorder 09/25/2015  . Hearing loss sensory, bilateral 08/26/2015  . Cerebral microvascular disease 08/26/2015  . Metabolic syndrome 07/23/2015  . Vertigo 06/21/2015  . Buzzing in ear 06/21/2015  . Hypertension goal BP (blood pressure) < 140/90 03/28/2015  . GERD (gastroesophageal reflux disease) 03/28/2015  . Hyperlipidemia 03/28/2015  . Depression, major 03/28/2015  . CAD in native artery 03/28/2015  . Actinic keratosis 02/14/2015  . Obstructive sleep apnea 02/03/2015  . Diverticulosis of colon without hemorrhage 01/31/2015  . Hx of colonic polyps   . Benign neoplasm of descending colon   . Benign neoplasm of sigmoid colon   . Idiopathic colitis   . Sebaceous cyst 09/14/2014    Past Surgical History:  Procedure Laterality Date  . BACK SURGERY    . BASAL CELL CARCINOMA EXCISION Left 12/02/2015   Chest-Done by Dermatologist   . CARDIAC CATHETERIZATION Left 04/01/2016   Procedure: Left Heart Cath and Coronary Angiography;  Surgeon: Kenneth A Fath, MD;  Location: [facility] INVASIVE CV LAB;  Service: Cardiovascular;  Laterality: Left;  . CARDIAC CATHETERIZATION N/A 04/01/2016   Procedure: Intravascular Pressure Wire/FFR Study;  Surgeon: Alexander Paraschos, MD;  Location: [facility] INVASIVE CV LAB;  Service: Cardiovascular;  Laterality: N/A;  . COLONOSCOPY N/A 01/15/2015   Wohl-ileitis, 2 benign polyps, cryptitis, sigmoid diverticulosis,  focal ulceration ICV  . CORONARY STENT PLACEMENT    . FRACTIONAL FLOW RESERVE WIRE  10/08/2011   Procedure: FRACTIONAL FLOW RESERVE WIRE;  Surgeon: Mohan N Harwani, MD;  Location: [facility] CATH LAB;  Service: Cardiovascular;;  . HERNIA REPAIR    . KNEE SURGERY    . LEFT HEART CATHETERIZATION WITH CORONARY ANGIOGRAM N/A 10/08/2011   Procedure: LEFT HEART CATHETERIZATION WITH CORONARY ANGIOGRAM;  Surgeon: Mohan N Harwani, MD;  Location: [facility] CATH LAB;  Service: Cardiovascular;  Laterality: N/A;  . TOTAL HIP ARTHROPLASTY Left 04/06/2017   Procedure: TOTAL HIP ARTHROPLASTY ANTERIOR APPROACH;  Surgeon: Menz, Michael, MD;  Location: [facility] ORS;  Service: Orthopedics;  Laterality: Left;    Family History  Problem Relation Age of Onset  . Lung cancer Father   . Heart disease Father   . Skin cancer Father     Social History   Social History  . Marital status: Married    Spouse name: N/A  . Number of children: N/A  . Years of education: N/A   Occupational History  . Not on file.   Social History Main Topics  . Smoking status: Former Smoker    Years: 40.00    Types: Cigarettes    Quit date: 08/03/2012  . Smokeless tobacco: Never Used  . Alcohol use No  . Drug use: No  . Sexual activity: Yes    Partners: Female   Other Topics Concern  . Not on file   Social History Narrative  . No narrative on file     Current Outpatient Prescriptions:  .  aspirin EC 81 MG   tablet, Take 81 mg by mouth every morning. , Disp: , Rfl:  .  bisacodyl (DULCOLAX) 10 MG suppository, Place 1 suppository (10 mg total) rectally daily as needed for moderate constipation., Disp: 12 suppository, Rfl: 0 .  Blood Glucose Monitoring Suppl (ACCU-CHEK AVIVA PLUS) w/Device KIT, 1 application by Does not apply route daily., Disp: 1 kit, Rfl: 0 .  buPROPion (WELLBUTRIN XL) 150 MG 24 hr tablet, Take 3 tablets (450 mg total) by mouth daily., Disp: 90 tablet, Rfl: 3 .  butalbital-aspirin-caffeine (FIORINAL) 50-325-40 MG capsule, Take 1  capsule by mouth every 6 (six) hours as needed for headache., Disp: 30 capsule, Rfl: 0 .  docusate sodium (COLACE) 100 MG capsule, Take 1 capsule (100 mg total) by mouth 2 (two) times daily., Disp: 10 capsule, Rfl: 0 .  escitalopram (LEXAPRO) 20 MG tablet, Take 1 tablet (20 mg total) by mouth daily., Disp: 30 tablet, Rfl: 3 .  glucose blood (ACCU-CHEK ACTIVE STRIPS) test strip, Use as instructed, Disp: 100 each, Rfl: 12 .  Lancets (ACCU-CHEK SOFT TOUCH) lancets, Use as instructed, Disp: 100 each, Rfl: 12 .  methocarbamol (ROBAXIN) 500 MG tablet, Take 1 tablet (500 mg total) by mouth every 6 (six) hours as needed for muscle spasms., Disp: 30 tablet, Rfl: 0 .  metoprolol succinate (TOPROL-XL) 50 MG 24 hr tablet, TAKE 1 TABLET BY MOUTH EVERY EVENING. (Patient taking differently: qam.), Disp: 30 tablet, Rfl: 3 .  nitroGLYCERIN (NITROSTAT) 0.4 MG SL tablet, Place 1 tablet (0.4 mg total) under the tongue every 5 (five) minutes as needed. For chest pain., Disp: 30 tablet, Rfl: 0 .  Omeprazole 20 MG TBEC, , Disp: , Rfl: 3 .  oxyCODONE (OXY IR/ROXICODONE) 5 MG immediate release tablet, Take 1-2 tablets (5-10 mg total) by mouth every 4 (four) hours as needed for breakthrough pain., Disp: 30 tablet, Rfl: 0 .  amLODipine (NORVASC) 2.5 MG tablet, Take 1 tablet (2.5 mg total) by mouth daily., Disp: 30 tablet, Rfl: 3 .  atorvastatin (LIPITOR) 40 MG tablet, Take 1 tablet (40 mg total) by mouth daily., Disp: 30 tablet, Rfl: 3 .  clindamycin (CLEOCIN) 300 MG capsule, , Disp: , Rfl: 0 .  losartan-hydrochlorothiazide (HYZAAR) 100-25 MG tablet, Take 1 tablet by mouth daily., Disp: 30 tablet, Rfl: 3 .  metFORMIN (GLUCOPHAGE-XR) 500 MG 24 hr tablet, Take 1 tablet (500 mg total) by mouth daily with breakfast., Disp: 30 tablet, Rfl: 3 .  omeprazole (PRILOSEC) 20 MG capsule, TAKE 1 CAPSULE BY MOUTH DAILY., Disp: 30 capsule, Rfl: 3  Allergies  Allergen Reactions  . Elavil [Amitriptyline Hcl] Rash  . Tape Rash    Paper  Tape     ROS  ***  Objective  Vitals:   05/11/17 1112  BP: 118/76  Pulse: 76  Resp: 16  Temp: 98.2 F (36.8 C)  TempSrc: Oral  SpO2: 98%  Weight: 211 lb 6.4 oz (95.9 kg)  Height: 5' 6.93" (1.7 m)    Body mass index is 33.18 kg/m.  Physical Exam ***    Diabetic Foot Exam: Diabetic Foot Exam - Simple   No data filed     ***  PHQ2/9: Depression screen [facility] 2/9 05/11/2017 10/15/2016 10/01/2016 07/02/2016 06/02/2016  Decreased Interest 0 2 1 0 0  Down, Depressed, Hopeless 0 3 0 0 0  PHQ - 2 Score 0 5 1 0 0  Altered sleeping - 0 - - -  Tired, decreased energy - 2 - - -  Change in appetite - 0 - - -  Feeling bad or failure about yourself  - 0 - - -  Trouble concentrating - 0 - - -  Moving slowly or fidgety/restless - 0 - - -  Suicidal thoughts - 0 - - -  PHQ-9 Score - 7 - - -  Difficult doing work/chores - Somewhat difficult - - -   ***  Fall Risk: Fall Risk  05/11/2017 10/15/2016 10/01/2016 07/02/2016 06/02/2016  Falls in the past year? _0   Number falls in past yr: - - - - -  Injury with Fall? - - - - -  Risk for fall due to : - - - - -  Follow up - - - - -   ***   Functional Status Survey: Is the patient deaf or have difficulty hearing?: No Does the patient have difficulty seeing, even when wearing glasses/contacts?: No Does the patient have difficulty concentrating, remembering, or making decisions?: No Does the patient have difficulty walking or climbing stairs?: No Does the patient have difficulty dressing or bathing?: No Does the patient have difficulty doing errands alone such as visiting a doctor's office or shopping?: No ***   Assessment & Plan  1. Welcome to Medicare preventive visit *** - EKG 12-Lead - Visual acuity screening  2. Dyslipidemia associated with type 2 diabetes mellitus ([facility]CC) *** - metFORMIN (GLUCOPHAGE-XR) 500 MG 24 hr tablet; Take 1 tablet (500 mg total) by mouth daily with breakfast.  Dispense: 30 tablet;  Refill: 3  3. Hypertension, benign *** - losartan-hydrochlorothiazide (HYZAAR) 100-25 MG tablet; Take 1 tablet by mouth daily.  Dispense: 30 tablet; Refill: 3 - amLODipine (NORVASC) 2.5 MG tablet; Take 1 tablet (2.5 mg total) by mouth daily.  Dispense: 30 tablet; Refill: 3  4. Depression, major, recurrent, mild (HCC) ***  5. Mixed hyperlipidemia *** - atorvastatin (LIPITOR) 40 MG tablet; Take 1 tablet (40 mg total) by mouth daily.  Dispense: 30 tablet; Refill: 3  6. Needs flu shot *** - Flu vaccine HIGH DOSE PF  7. Obstructive sleep apnea *** - Nocturnal polysomnography (NPSG); Future

## 2017-05-11 NOTE — Progress Notes (Signed)
Patient: Jimmy Green, Male    DOB: January 18, 1951, 66 y.o.   MRN: [id number]  Visit Date: 05/11/2017  Today's Provider: Loistine Chance, MD   Chief Complaint  Patient presents with  . Welcome to Medicare    Subjective:   Jimmy Green is a 66 y.o. male who presents today for his Subsequent Annual Wellness Visit.  Patient/Caregiver input:     HPI   Depression Major: phq 9 is normal today, taking medication daily, no suicidal thoughts or ideation, having more energy since the pain has improved since surgery  Dyslipidemia with type II DM: he denies polyphagia, polydipsia or polyuria. His hgbA1C was 6.7% diagnosed March 2018, fasting at home has been well controlled 90's-130's Explained importance of life style modification and diabetic class ( he refuses ), exercise, aspirin daily, ARB, improving HDL, and to continue statin therapy. Eye exam is up to date. Taking metformin since march but stopped taking it yesterday because of mouth sores, explained likely unrelated. He has been losing weight on medication, and has been more active since hip surgery   HTN: bp is at goal , she has been taking medication daily. He has some SOB but no recent chest pain with activity, no  palpitation. No side effects of medication. BP at local pharmacy and is around 130's/80's, today bp is slightly low but denies dizziness  Hyperlipidemia: taking Atorvastatin and denies side effects. No myalgia.    OSA: he got a CPAP machine that he got from a friend and waswearing 4 times weekly, but not over the past 12 months because the mask was not fitting properly. He still has a headache in am's, but no longer snoring since he raised the head of his bed. Dr. Ubaldo Glassing recommended repeat sleep study, we will shedule it for him   History of basal cell carcinoma: Seeing Dermatologist at [facility].   CAD: taking aspirin, statin and beta blocker and ARB,and he has NTG in his pocket. Nochest pain or  decrease in exercise tolerance. He was seen by Dr. Ubaldo Glassing in 04/2016, and had another heart cath that showed no significant disease, 55-70% stenosis on the LAD , on medical management, metoprolol, losartan, aspirin, he has not used a NTG over the past few months.  Review of Systems    Constitutional: Negative for fever, positive for  weight change.  Respiratory: Negative for cough and shortness of breath.   Cardiovascular: Negative for chest pain or palpitations.  Gastrointestinal: Negative for abdominal pain, no bowel changes.  Musculoskeletal: Positive for gait problem ( using cane since hip surgery done 04/2017)  No joint swelling.  Skin: Negative for rash.  Neurological: Negative for dizziness or headache.  No other specific complaints in a complete review of systems (except as listed in HPI above).  Past Medical History:  Diagnosis Date  . Anginal pain ([place])   . Coronary artery disease   . Depression   . Diabetes mellitus without complication ([place])   . GERD (gastroesophageal reflux disease)   . Hyperlipidemia   . Hypertension   . Hypogonadism in male   . Metabolic syndrome   . Myocardial infarction ([place])   . Orthopnea   . Skin cancer 2014   nose and right knee  . Sleep apnea     Past Surgical History:  Procedure Laterality Date  . BACK SURGERY    . BASAL CELL CARCINOMA EXCISION Left 12/02/2015   Chest-Done by Dermatologist   . CARDIAC CATHETERIZATION Left 04/01/2016   Procedure:  Left Heart Cath and Coronary Angiography;  Surgeon: Teodoro Spray, MD;  Location: [facility];  Service: Cardiovascular;  Laterality: Left;  . CARDIAC CATHETERIZATION N/A 04/01/2016   Procedure: Intravascular Pressure Wire/FFR Study;  Surgeon: Isaias Cowman, MD;  Location: [facility];  Service: Cardiovascular;  Laterality: N/A;  . COLONOSCOPY N/A 01/15/2015   Wohl-ileitis, 2 benign polyps, cryptitis, sigmoid diverticulosis, focal ulceration ICV  . CORONARY STENT PLACEMENT     . FRACTIONAL FLOW RESERVE WIRE  10/08/2011   Procedure: FRACTIONAL FLOW RESERVE WIRE;  Surgeon: Clent Demark, MD;  Location: [facility] CATH LAB;  Service: Cardiovascular;;  . HERNIA REPAIR    . KNEE SURGERY    . LEFT HEART CATHETERIZATION WITH CORONARY ANGIOGRAM N/A 10/08/2011   Procedure: LEFT HEART CATHETERIZATION WITH CORONARY ANGIOGRAM;  Surgeon: Clent Demark, MD;  Location: [facility];  Service: Cardiovascular;  Laterality: N/A;  . TOTAL HIP ARTHROPLASTY Left 04/06/2017   Procedure: TOTAL HIP ARTHROPLASTY ANTERIOR APPROACH;  Surgeon: Hessie Knows, MD;  Location: [facility] ORS;  Service: Orthopedics;  Laterality: Left;    Family History  Problem Relation Age of Onset  . Lung cancer Father   . Heart disease Father   . Skin cancer Father     Social History   Social History  . Marital status: Married    Spouse name: N/A  . Number of children: N/A  . Years of education: N/A   Occupational History  . Not on file.   Social History Main Topics  . Smoking status: Former Smoker    Years: 40.00    Types: Cigarettes    Quit date: 08/03/2012  . Smokeless tobacco: Never Used  . Alcohol use No  . Drug use: No  . Sexual activity: Yes    Partners: Female   Other Topics Concern  . Not on file   Social History Narrative  . No narrative on file    Outpatient Encounter Prescriptions as of 05/11/2017  Medication Sig  . aspirin EC 81 MG tablet Take 81 mg by mouth every morning.   . bisacodyl (DULCOLAX) 10 MG suppository Place 1 suppository (10 mg total) rectally daily as needed for moderate constipation.  . Blood Glucose Monitoring Suppl (ACCU-CHEK AVIVA PLUS) w/Device KIT 1 application by Does not apply route daily.  [name] buPROPion (WELLBUTRIN XL) 150 MG 24 hr tablet Take 3 tablets (450 mg total) by mouth daily.  . butalbital-aspirin-caffeine (FIORINAL) 50-325-40 MG capsule Take 1 capsule by mouth every 6 (six) hours as needed for headache.  . docusate sodium (COLACE) 100 MG capsule Take 1  capsule (100 mg total) by mouth 2 (two) times daily.  [name] escitalopram (LEXAPRO) 20 MG tablet Take 1 tablet (20 mg total) by mouth daily.  [name] glucose blood (ACCU-CHEK ACTIVE STRIPS) test strip Use as instructed  . Lancets (ACCU-CHEK SOFT TOUCH) lancets Use as instructed  . methocarbamol (ROBAXIN) 500 MG tablet Take 1 tablet (500 mg total) by mouth every 6 (six) hours as needed for muscle spasms.  . metoprolol succinate (TOPROL-XL) 50 MG 24 hr tablet TAKE 1 TABLET BY MOUTH EVERY EVENING. (Patient taking differently: qam.)  . nitroGLYCERIN (NITROSTAT) 0.4 MG SL tablet Place 1 tablet (0.4 mg total) under the tongue every 5 (five) minutes as needed. For chest pain.  [name] Omeprazole 20 MG TBEC   . oxyCODONE (OXY IR/ROXICODONE) 5 MG immediate release tablet Take 1-2 tablets (5-10 mg total) by mouth every 4 (four) hours as needed for breakthrough pain.  . [  DISCONTINUED] nabumetone (RELAFEN) 750 MG tablet Take 750 mg by mouth 2 (two) times daily as needed for mild pain.  . [DISCONTINUED] traMADol (ULTRAM) 50 MG tablet Take 1 tablet (50 mg total) by mouth every 4 (four) hours as needed for moderate pain.  [name] amLODipine (NORVASC) 2.5 MG tablet Take 1 tablet (2.5 mg total) by mouth daily.  [name] atorvastatin (LIPITOR) 40 MG tablet Take 1 tablet (40 mg total) by mouth daily.  . clindamycin (CLEOCIN) 300 MG capsule   . losartan-hydrochlorothiazide (HYZAAR) 100-25 MG tablet Take 1 tablet by mouth daily.  . metFORMIN (GLUCOPHAGE-XR) 500 MG 24 hr tablet Take 1 tablet (500 mg total) by mouth daily with breakfast.  . omeprazole (PRILOSEC) 20 MG capsule TAKE 1 CAPSULE BY MOUTH DAILY.  . [DISCONTINUED] amLODipine (NORVASC) 2.5 MG tablet Take 1 tablet (2.5 mg total) by mouth daily.  . [DISCONTINUED] atorvastatin (LIPITOR) 40 MG tablet Take 1 tablet (40 mg total) by mouth daily.  . [DISCONTINUED] enoxaparin (LOVENOX) 40 MG/0.4ML injection Inject 0.4 mLs (40 mg total) into the skin daily. (Patient not taking: Reported on  05/11/2017)  . [DISCONTINUED] losartan-hydrochlorothiazide (HYZAAR) 100-25 MG tablet Take 1 tablet by mouth daily.  . [DISCONTINUED] metFORMIN (GLUCOPHAGE-XR) 500 MG 24 hr tablet Take 1 tablet (500 mg total) by mouth daily with breakfast.   No facility-administered encounter medications on file as of 05/11/2017.     Allergies  Allergen Reactions  . Elavil [Amitriptyline Hcl] Rash  . Tape Rash    Paper Tape    Care Team Updated in EHR: Yes also goes to [facility]  Last Vision Exam:  Wears corrective lenses: Yes - reading, last eye exam was  03/22/2017 Last Dental Exam: not recently  Last Hearing Exam:  Wears Hearing Aids: No  Functional Ability / Safety Screening 1.  Was the timed Get Up and Go test longer than 30 seconds?  yes 2.  Does the patient need help with the phone, transportation, shopping,      preparing meals, housework, laundry, medications, or managing money?  yes 3.  Does the patient's home have:  loose throw rugs in the hallway?   no      Grab bars in the bathroom? yes      Handrails on the stairs?   yes      Poor lighting?   yes 4.  Has the patient noticed any hearing difficulties?   no  Diet Recall and Exercise Regimen:   Current Exercise Habits: The patient does not participate in regular exercise at present    Advanced Directives: A voluntary discussion about advance care planning including the explanation and discussion of advance directives was discussed with the patient. Explanation about the health care proxy and living will was reviewed.   and plans/does not plan to fill out the paperwork required and will bring this to our office to keep on file. Does patient have a HCPOA?    no If yes, name and contact information:  Does patient have a living will or MOST form?  no, we will give forms today for his review  Cancer Screenings:  Lung: Low Dose CT Chest recommended if Age 75-80 years, 30 pack-year currently smoking OR have quit w/in 15years.  Patient does not qualify.  Lifestyle risk factor issued reviewed: Diet, exercise, weight management, advised patient smoking is not healthy, nutrition/diet.   Prostate: discussed UsPTF, not doing rectal exam or checking PSA today  Colon: referral colonoscopy   Additional Screenings: Hepatitis B/HIV/Syphillis: not  interested Hepatitis C Screening: up to date Intimate Partner Violence:  No  AAA Screen: Men age 57 to 54 years if ever smoked recommended to get a one time AAA ultrasound screening exam. Patient does qualify.  Objective:   Vitals: BP 118/76 (BP Location: Left Arm, Patient Position: Sitting, Cuff Size: Large)   Pulse 76   Temp 98.2 F (36.8 C) (Oral)   Resp 16   Ht 5' 6.93" (1.7 m)   Wt 211 lb 6.4 oz (95.9 kg)   SpO2 98%   BMI 33.18 kg/m  Body mass index is 33.18 kg/m.  Lab Results  Component Value Date   CHOL 132 10/01/2016   CHOL 149 06/07/2015   CHOL 155 10/18/2013   Lab Results  Component Value Date   HDL 27 (L) 10/01/2016   HDL 28 (L) 06/07/2015   HDL 30 (L) 10/18/2013   Lab Results  Component Value Date   LDLCALC 76 10/01/2016   LDLCALC 94 06/07/2015   LDLCALC 95 10/18/2013   Lab Results  Component Value Date   TRIG 146 10/01/2016   TRIG 136 06/07/2015   TRIG 150 (H) 10/18/2013   Lab Results  Component Value Date   CHOLHDL 4.9 10/01/2016   CHOLHDL 5.3 (H) 06/07/2015   CHOLHDL 5.2 10/18/2013   No results found for: LDLDIRECT   Visual Acuity Screening   Right eye Left eye Both eyes  Without correction: _0  With correction:       Physical Exam Constitutional: Patient appears well-developed and well-nourished. Obese  No distress.  HEENT: head atraumatic, normocephalic, pupils equal and reactive to light, ears normal TM,  neck supple, throat within normal limits Cardiovascular: Normal rate, regular rhythm and normal heart sounds.  No murmur heard. No BLE edema. Pulmonary/Chest: Effort normal and breath sounds normal. No  respiratory distress. Abdominal: Soft.  There is no tenderness. Psychiatric: Patient has a normal mood and affect. behavior is normal. Judgment and thought content normal.  IPSS Questionnaire (AUA-7): Over the past month.   1)  How often have you had a sensation of not emptying your bladder completely after you finish urinating?  0 - Not at all  2)  How often have you had to urinate again less than two hours after you finished urinating? 1 - Less than 1 time in 5  3)  How often have you found you stopped and started again several times when you urinated?  0 - Not at all  4) How difficult have you found it to postpone urination?  1 - less than 1-5   5) How often have you had a weak urinary stream?  0 - Not at all  6) How often have you had to push or strain to begin urination?  0 - Not at all  7) How many times did you most typically get up to urinate from the time you went to bed until the time you got up in the morning?  0 - None  Total score:  0-7 mildly symptomatic   8-19 moderately symptomatic   20-35 severely symptomatic     Cognitive Testing - 6-CIT  Correct? Score   What year is it? yes 0 Yes = 0    No = 4  What month is it? yes 0 Yes = 0    No = 3  Remember:     Pia Mau, [place], [place]     What time is it? yes 0 Yes = 0  No = 3  Count backwards from 20 to 1 yes 0 Correct = 0    1 error = 2   More than 1 error = 4  Say the months of the year in reverse. yes 0 Correct = 0    1 error = 2   More than 1 error = 4  What address did I ask you to remember? no 2 Correct = 0  1 error = 2    2 error = 4    3 error = 6    4 error = 8    All wrong = 10       TOTAL SCORE  4/28   Interpretation:  Normal  Normal (0-7) Abnormal (8-28)   Fall Risk: Fall Risk  05/11/2017 10/15/2016 10/01/2016 07/02/2016 06/02/2016  Falls in the past year? _0   Number falls in past yr: - - - - -  Injury with Fall? - - - - -  Risk for fall due to : - - - - -  Follow up - - - - -     Depression Screen Depression screen [facility] 2/9 05/11/2017 10/15/2016 10/01/2016 07/02/2016 06/02/2016  Decreased Interest 0 2 1 0 0  Down, Depressed, Hopeless 0 3 0 0 0  PHQ - 2 Score 0 5 1 0 0  Altered sleeping - 0 - - -  Tired, decreased energy - 2 - - -  Change in appetite - 0 - - -  Feeling bad or failure about yourself  - 0 - - -  Trouble concentrating - 0 - - -  Moving slowly or fidgety/restless - 0 - - -  Suicidal thoughts - 0 - - -  PHQ-9 Score - 7 - - -  Difficult doing work/chores - Somewhat difficult - - -    Recent Results (from the past 2160 hour(s))  HM DIABETES EYE EXAM     Status: None   Collection Time: 03/22/17 12:00 AM  Result Value Ref Range   HM Diabetic Eye Exam No Retinopathy No Retinopathy  CBC     Status: None   Collection Time: 03/31/17  8:57 AM  Result Value Ref Range   WBC 8.2 3.8 - 10.6 K/uL   RBC 4.99 4.40 - 5.90 MIL/uL   Hemoglobin 15.6 13.0 - 18.0 g/dL   HCT 45.6 40.0 - 52.0 %   MCV 91.4 80.0 - 100.0 fL   MCH 31.3 26.0 - 34.0 pg   MCHC 34.2 32.0 - 36.0 g/dL   RDW 13.7 11.5 - 14.5 %   Platelets 295 150 - 440 K/uL  Sedimentation rate     Status: None   Collection Time: 03/31/17  8:57 AM  Result Value Ref Range   Sed Rate 6 0 - 20 mm/hr  Basic metabolic panel     Status: Abnormal   Collection Time: 03/31/17  8:57 AM  Result Value Ref Range   Sodium 138 135 - 145 mmol/L   Potassium 3.4 (L) 3.5 - 5.1 mmol/L   Chloride 103 101 - 111 mmol/L   CO2 28 22 - 32 mmol/L   Glucose, Bld 117 (H) 65 - 99 mg/dL   BUN 10 6 - 20 mg/dL   Creatinine, Ser 0.68 0.61 - 1.24 mg/dL   Calcium 9.7 8.9 - 10.3 mg/dL   GFR calc non Af Amer >60 >60 mL/min   GFR calc Af Amer >60 >60 mL/min    Comment: (NOTE) The eGFR has been  calculated using the CKD EPI equation. This calculation has not been validated in all clinical situations. eGFR's persistently <60 mL/min signify possible Chronic Kidney Disease.    Anion gap 7 5 - 15  Protime-INR     Status: None    Collection Time: 03/31/17  8:57 AM  Result Value Ref Range   Prothrombin Time 14.6 11.4 - 15.2 seconds   INR 1.15   APTT     Status: None   Collection Time: 03/31/17  8:57 AM  Result Value Ref Range   aPTT 35 24 - 36 seconds  Urinalysis, Complete w Microscopic     Status: Abnormal   Collection Time: 03/31/17  8:57 AM  Result Value Ref Range   Color, Urine STRAW (A) YELLOW   APPearance CLEAR (A) CLEAR   Specific Gravity, Urine 1.008 1.005 - 1.030   pH 8.0 5.0 - 8.0   Glucose, UA NEGATIVE NEGATIVE mg/dL   Hgb urine dipstick NEGATIVE NEGATIVE   Bilirubin Urine NEGATIVE NEGATIVE   Ketones, ur NEGATIVE NEGATIVE mg/dL   Protein, ur NEGATIVE NEGATIVE mg/dL   Nitrite NEGATIVE NEGATIVE   Leukocytes, UA NEGATIVE NEGATIVE   RBC / HPF NONE SEEN 0 - 5 RBC/hpf   WBC, UA 0-5 0 - 5 WBC/hpf   Bacteria, UA NONE SEEN NONE SEEN   Squamous Epithelial / LPF NONE SEEN NONE SEEN  Urine culture     Status: None   Collection Time: 03/31/17  8:57 AM  Result Value Ref Range   Specimen Description URINE, CLEAN CATCH    Special Requests NONE    Culture      NO GROWTH Performed at [facility], [address], [place], [place] [address]    Report Status 04/01/2017 FINAL   Surgical pcr screen     Status: Abnormal   Collection Time: 03/31/17  8:57 AM  Result Value Ref Range   MRSA, PCR POSITIVE (A) NEGATIVE    Comment: RESULT CALLED TO, READ BACK BY AND VERIFIED WITH: KAREN DIXON 03/31/17 1316 KLW    Staphylococcus aureus POSITIVE (A) NEGATIVE    Comment: (NOTE) The Xpert SA Assay (FDA approved for NASAL specimens in patients 16 years of age and older), is one component of a comprehensive surveillance program. It is not intended to diagnose infection nor to guide or monitor treatment.   Type and screen [place]     Status: None   Collection Time: 03/31/17  8:57 AM  Result Value Ref Range   ABO/RH(D) O POS    Antibody Screen NEG    Sample Expiration 04/14/2017     Extend sample reason NO TRANSFUSIONS OR PREGNANCY IN THE PAST 3 MONTHS   Glucose, capillary     Status: Abnormal   Collection Time: 04/06/17  7:48 AM  Result Value Ref Range   Glucose-Capillary 130 (H) 65 - 99 mg/dL  ABO/Rh     Status: None   Collection Time: 04/06/17  8:07 AM  Result Value Ref Range   ABO/RH(D) O POS   Surgical pathology     Status: None   Collection Time: 04/06/17  9:39 AM  Result Value Ref Range   SURGICAL PATHOLOGY      Surgical Pathology CASE: [id number] PATIENT: [name] Surgical Pathology Report     SPECIMEN SUBMITTED: A. Femoral head, left  CLINICAL HISTORY: None provided  PRE-OPERATIVE DIAGNOSIS: Primary osteoarthritis of left hip  POST-OPERATIVE DIAGNOSIS: Same as pre op     DIAGNOSIS: A. LEFT FEMORAL  HEAD; ARTHROPLASTY: - OSTEOARTHRITIS.   GROSS DESCRIPTION:  A. Labeled: left femoral head  Size of specimen:      Head -4.5 x 4.6 cm      Neck -4.0 x 2.4 cm  Articular surface: Tan with focal granularity  Cut surface: red-brown  Other findings: none noted  Block summary: 1 - representative section(s), post decalcification    Final Diagnosis performed by Delorse Lek, MD.  Electronically signed 04/08/2017 4:59:01PM    The electronic signature indicates that the named Attending Pathologist has evaluated the specimen  Technical component performed at [facility], [address] [place], [place] [address] Lab: [phone] Dir: Darrick Penna. Veleta Miners D  Professional component performed at [facility], [facility], [place], [place], [place] [address] Lab: [phone] Dir: Dellia Nims. Rubinas, MD    Glucose, capillary     Status: Abnormal   Collection Time: 04/06/17 12:16 PM  Result Value Ref Range   Glucose-Capillary 104 (H) 65 - 99 mg/dL  CBC     Status: Abnormal   Collection Time: 04/06/17  4:40 PM  Result Value Ref Range   WBC 16.1 (H) 3.8 - 10.6 K/uL   RBC 4.16 (L) 4.40 - 5.90 MIL/uL    Hemoglobin 13.2 13.0 - 18.0 g/dL   HCT 38.3 (L) 40.0 - 52.0 %   MCV 92.1 80.0 - 100.0 fL   MCH 31.7 26.0 - 34.0 pg   MCHC 34.4 32.0 - 36.0 g/dL   RDW 13.7 11.5 - 14.5 %   Platelets 223 150 - 440 K/uL  Creatinine, serum     Status: None   Collection Time: 04/06/17  4:40 PM  Result Value Ref Range   Creatinine, Ser 0.64 0.61 - 1.24 mg/dL   GFR calc non Af Amer >60 >60 mL/min   GFR calc Af Amer >60 >60 mL/min    Comment: (NOTE) The eGFR has been calculated using the CKD EPI equation. This calculation has not been validated in all clinical situations. eGFR's persistently <60 mL/min signify possible Chronic Kidney Disease.   Glucose, capillary     Status: Abnormal   Collection Time: 04/06/17  5:03 PM  Result Value Ref Range   Glucose-Capillary 129 (H) 65 - 99 mg/dL  Glucose, capillary     Status: Abnormal   Collection Time: 04/06/17  8:55 PM  Result Value Ref Range   Glucose-Capillary 153 (H) 65 - 99 mg/dL   Comment 1 Notify RN   CBC     Status: Abnormal   Collection Time: 04/07/17  3:32 AM  Result Value Ref Range   WBC 10.9 (H) 3.8 - 10.6 K/uL   RBC 3.73 (L) 4.40 - 5.90 MIL/uL   Hemoglobin 11.9 (L) 13.0 - 18.0 g/dL   HCT 33.7 (L) 40.0 - 52.0 %   MCV 90.3 80.0 - 100.0 fL   MCH 31.9 26.0 - 34.0 pg   MCHC 35.3 32.0 - 36.0 g/dL   RDW 13.7 11.5 - 14.5 %   Platelets 205 150 - 440 K/uL  Basic metabolic panel     Status: Abnormal   Collection Time: 04/07/17  3:32 AM  Result Value Ref Range   Sodium 135 135 - 145 mmol/L   Potassium 3.5 3.5 - 5.1 mmol/L   Chloride 104 101 - 111 mmol/L   CO2 24 22 - 32 mmol/L   Glucose, Bld 131 (H) 65 - 99 mg/dL   BUN 14 6 - 20 mg/dL   Creatinine, Ser 0.67 0.61 - 1.24 mg/dL  Calcium 8.3 (L) 8.9 - 10.3 mg/dL   GFR calc non Af Amer >60 >60 mL/min   GFR calc Af Amer >60 >60 mL/min    Comment: (NOTE) The eGFR has been calculated using the CKD EPI equation. This calculation has not been validated in all clinical situations. eGFR's persistently  <60 mL/min signify possible Chronic Kidney Disease.    Anion gap 7 5 - 15  Glucose, capillary     Status: Abnormal   Collection Time: 04/07/17  7:42 AM  Result Value Ref Range   Glucose-Capillary 105 (H) 65 - 99 mg/dL  Glucose, capillary     Status: None   Collection Time: 04/07/17 12:04 PM  Result Value Ref Range   Glucose-Capillary 98 65 - 99 mg/dL  Glucose, capillary     Status: Abnormal   Collection Time: 04/07/17  4:32 PM  Result Value Ref Range   Glucose-Capillary 106 (H) 65 - 99 mg/dL  Glucose, capillary     Status: Abnormal   Collection Time: 04/07/17  9:10 PM  Result Value Ref Range   Glucose-Capillary 109 (H) 65 - 99 mg/dL   Comment 1 Notify RN   CBC     Status: Abnormal   Collection Time: 04/08/17  3:51 AM  Result Value Ref Range   WBC 13.8 (H) 3.8 - 10.6 K/uL   RBC 3.71 (L) 4.40 - 5.90 MIL/uL   Hemoglobin 11.5 (L) 13.0 - 18.0 g/dL   HCT 33.7 (L) 40.0 - 52.0 %   MCV 90.9 80.0 - 100.0 fL   MCH 31.0 26.0 - 34.0 pg   MCHC 34.1 32.0 - 36.0 g/dL   RDW 13.7 11.5 - 14.5 %   Platelets 204 150 - 440 K/uL  Basic metabolic panel     Status: Abnormal   Collection Time: 04/08/17  3:51 AM  Result Value Ref Range   Sodium 134 (L) 135 - 145 mmol/L   Potassium 3.2 (L) 3.5 - 5.1 mmol/L   Chloride 99 (L) 101 - 111 mmol/L   CO2 27 22 - 32 mmol/L   Glucose, Bld 132 (H) 65 - 99 mg/dL   BUN 20 6 - 20 mg/dL   Creatinine, Ser 0.79 0.61 - 1.24 mg/dL   Calcium 8.7 (L) 8.9 - 10.3 mg/dL   GFR calc non Af Amer >60 >60 mL/min   GFR calc Af Amer >60 >60 mL/min    Comment: (NOTE) The eGFR has been calculated using the CKD EPI equation. This calculation has not been validated in all clinical situations. eGFR's persistently <60 mL/min signify possible Chronic Kidney Disease.    Anion gap 8 5 - 15  Glucose, capillary     Status: Abnormal   Collection Time: 04/08/17  7:36 AM  Result Value Ref Range   Glucose-Capillary 114 (H) 65 - 99 mg/dL  Glucose, capillary     Status: None    Collection Time: 04/08/17 11:34 AM  Result Value Ref Range   Glucose-Capillary 92 65 - 99 mg/dL  Glucose, capillary     Status: None   Collection Time: 04/08/17  4:29 PM  Result Value Ref Range   Glucose-Capillary 88 65 - 99 mg/dL  Glucose, capillary     Status: Abnormal   Collection Time: 04/08/17  9:17 PM  Result Value Ref Range   Glucose-Capillary 114 (H) 65 - 99 mg/dL   Comment 1 Notify RN   Basic metabolic panel     Status: Abnormal   Collection Time: 04/09/17  4:03 AM  Result Value Ref Range   Sodium 135 135 - 145 mmol/L   Potassium 3.3 (L) 3.5 - 5.1 mmol/L   Chloride 100 (L) 101 - 111 mmol/L   CO2 29 22 - 32 mmol/L   Glucose, Bld 112 (H) 65 - 99 mg/dL   BUN 22 (H) 6 - 20 mg/dL   Creatinine, Ser 0.79 0.61 - 1.24 mg/dL   Calcium 8.7 (L) 8.9 - 10.3 mg/dL   GFR calc non Af Amer >60 >60 mL/min   GFR calc Af Amer >60 >60 mL/min    Comment: (NOTE) The eGFR has been calculated using the CKD EPI equation. This calculation has not been validated in all clinical situations. eGFR's persistently <60 mL/min signify possible Chronic Kidney Disease.    Anion gap 6 5 - 15  CBC     Status: Abnormal   Collection Time: 04/09/17  4:03 AM  Result Value Ref Range   WBC 10.5 3.8 - 10.6 K/uL   RBC 3.39 (L) 4.40 - 5.90 MIL/uL   Hemoglobin 10.6 (L) 13.0 - 18.0 g/dL   HCT 30.7 (L) 40.0 - 52.0 %   MCV 90.4 80.0 - 100.0 fL   MCH 31.2 26.0 - 34.0 pg   MCHC 34.6 32.0 - 36.0 g/dL   RDW 13.7 11.5 - 14.5 %   Platelets 207 150 - 440 K/uL  Glucose, capillary     Status: Abnormal   Collection Time: 04/09/17  7:42 AM  Result Value Ref Range   Glucose-Capillary 105 (H) 65 - 99 mg/dL  Glucose, capillary     Status: Abnormal   Collection Time: 04/09/17 11:24 AM  Result Value Ref Range   Glucose-Capillary 111 (H) 65 - 99 mg/dL    Visual Acuity Screening   Right eye Left eye Both eyes  Without correction: _0  With correction:        Assessment & Plan:     1. Welcome to  Medicare preventive visit  - EKG 12-Lead - Visual acuity screening - US AAA -colonoscopy screen   2. Dyslipidemia associated with type 2 diabetes mellitus (HCC)  - metFORMIN (GLUCOPHAGE-XR) 500 MG 24 hr tablet; Take 1 tablet (500 mg total) by mouth daily with breakfast.  Dispense: 30 tablet; Refill: 3  He stopped yesterday, because of mouth sores, but advised to give one week off and re-try it  3. Hypertension, benign  - losartan-hydrochlorothiazide (HYZAAR) 100-25 MG tablet; Take 1 tablet by mouth daily.  Dispense: 30 tablet; Refill: 3 - amLODipine (NORVASC) 2.5 MG tablet; Take 1 tablet (2.5 mg total) by mouth daily.  Dispense: 30 tablet; Refill: 3  4. Depression, major, recurrent, mild (HCC)  Stable on medication   5. Mixed hyperlipidemia  - atorvastatin (LIPITOR) 40 MG tablet; Take 1 tablet (40 mg total) by mouth daily.  Dispense: 30 tablet; Refill: 3  6. Needs flu shot  - Flu vaccine HIGH DOSE PF  7. Obstructive sleep apnea  - Nocturnal polysomnography (NPSG); Future   Exercise Activities and Dietary recommendations  Have titration sleep study Continue life style modification and weight loss  Discussed health benefits of physical activity, and encouraged him to engage in regular exercise appropriate for his age and condition.   Immunization History  Administered Date(s) Administered  . Influenza,inj,Quad PF,6+ Mos 03/28/2015, 05/11/2016  . Influenza-Unspecified 04/03/2014  . Pneumococcal Conjugate-13 04/03/2014  . Pneumococcal Polysaccharide-23 04/07/2017  . Tdap 04/03/2014  . Zoster 06/18/2014    Health Maintenance  Topic Date Due  . INFLUENZA  VACCINE  03/03/2017  . HEMOGLOBIN A1C  08/04/2017  . FOOT EXAM  10/15/2017  . OPHTHALMOLOGY EXAM  03/22/2018  . COLONOSCOPY  01/15/2020  . TETANUS/TDAP  04/03/2024  . Hepatitis C Screening  Completed  . HIV Screening  Completed  . PNA vac Low Risk Adult  Completed     Meds ordered this encounter   Medications  . metFORMIN (GLUCOPHAGE-XR) 500 MG 24 hr tablet    Sig: Take 1 tablet (500 mg total) by mouth daily with breakfast.    Dispense:  30 tablet    Refill:  3  . losartan-hydrochlorothiazide (HYZAAR) 100-25 MG tablet    Sig: Take 1 tablet by mouth daily.    Dispense:  30 tablet    Refill:  3  . atorvastatin (LIPITOR) 40 MG tablet    Sig: Take 1 tablet (40 mg total) by mouth daily.    Dispense:  30 tablet    Refill:  3  . amLODipine (NORVASC) 2.5 MG tablet    Sig: Take 1 tablet (2.5 mg total) by mouth daily.    Dispense:  30 tablet    Refill:  3    New dose  . clindamycin (CLEOCIN) 300 MG capsule    Refill:  0  . Omeprazole 20 MG TBEC    Refill:  3    Current Outpatient Prescriptions:  .  aspirin EC 81 MG tablet, Take 81 mg by mouth every morning. , Disp: , Rfl:  .  bisacodyl (DULCOLAX) 10 MG suppository, Place 1 suppository (10 mg total) rectally daily as needed for moderate constipation., Disp: 12 suppository, Rfl: 0 .  Blood Glucose Monitoring Suppl (ACCU-CHEK AVIVA PLUS) w/Device KIT, 1 application by Does not apply route daily., Disp: 1 kit, Rfl: 0 .  buPROPion (WELLBUTRIN XL) 150 MG 24 hr tablet, Take 3 tablets (450 mg total) by mouth daily., Disp: 90 tablet, Rfl: 3 .  butalbital-aspirin-caffeine (FIORINAL) 50-325-40 MG capsule, Take 1 capsule by mouth every 6 (six) hours as needed for headache., Disp: 30 capsule, Rfl: 0 .  docusate sodium (COLACE) 100 MG capsule, Take 1 capsule (100 mg total) by mouth 2 (two) times daily., Disp: 10 capsule, Rfl: 0 .  escitalopram (LEXAPRO) 20 MG tablet, Take 1 tablet (20 mg total) by mouth daily., Disp: 30 tablet, Rfl: 3 .  glucose blood (ACCU-CHEK ACTIVE STRIPS) test strip, Use as instructed, Disp: 100 each, Rfl: 12 .  Lancets (ACCU-CHEK SOFT TOUCH) lancets, Use as instructed, Disp: 100 each, Rfl: 12 .  methocarbamol (ROBAXIN) 500 MG tablet, Take 1 tablet (500 mg total) by mouth every 6 (six) hours as needed for muscle spasms.,  Disp: 30 tablet, Rfl: 0 .  metoprolol succinate (TOPROL-XL) 50 MG 24 hr tablet, TAKE 1 TABLET BY MOUTH EVERY EVENING. (Patient taking differently: qam.), Disp: 30 tablet, Rfl: 3 .  nitroGLYCERIN (NITROSTAT) 0.4 MG SL tablet, Place 1 tablet (0.4 mg total) under the tongue every 5 (five) minutes as needed. For chest pain., Disp: 30 tablet, Rfl: 0 .  Omeprazole 20 MG TBEC, , Disp: , Rfl: 3 .  oxyCODONE (OXY IR/ROXICODONE) 5 MG immediate release tablet, Take 1-2 tablets (5-10 mg total) by mouth every 4 (four) hours as needed for breakthrough pain., Disp: 30 tablet, Rfl: 0 .  amLODipine (NORVASC) 2.5 MG tablet, Take 1 tablet (2.5 mg total) by mouth daily., Disp: 30 tablet, Rfl: 3 .  atorvastatin (LIPITOR) 40 MG tablet, Take 1 tablet (40 mg total) by mouth daily., Disp: 30 tablet, Rfl:  3 .  clindamycin (CLEOCIN) 300 MG capsule, , Disp: , Rfl: 0 .  losartan-hydrochlorothiazide (HYZAAR) 100-25 MG tablet, Take 1 tablet by mouth daily., Disp: 30 tablet, Rfl: 3 .  metFORMIN (GLUCOPHAGE-XR) 500 MG 24 hr tablet, Take 1 tablet (500 mg total) by mouth daily with breakfast., Disp: 30 tablet, Rfl: 3 .  omeprazole (PRILOSEC) 20 MG capsule, TAKE 1 CAPSULE BY MOUTH DAILY., Disp: 30 capsule, Rfl: 3 Medications Discontinued During This Encounter  Medication Reason  . nabumetone (RELAFEN) 750 MG tablet Completed Course  . traMADol (ULTRAM) 50 MG tablet Change in therapy  . enoxaparin (LOVENOX) 40 MG/0.4ML injection Completed Course  . metFORMIN (GLUCOPHAGE-XR) 500 MG 24 hr tablet Reorder  . losartan-hydrochlorothiazide (HYZAAR) 100-25 MG tablet Reorder  . atorvastatin (LIPITOR) 40 MG tablet Reorder  . amLODipine (NORVASC) 2.5 MG tablet Reorder    I have personally reviewed and addressed the Medicare Annual Wellness health risk assessment questionnaire and have noted the following in the patient's chart:  A.         Medical and social history & family history B.         Use of alcohol, tobacco or illicit drugs  C.          Current medications and supplements D.         Functional and Cognitive ability and status E.         Nutritional status F.         Physical activity G.        Advance directives H.         List of other physicians I.          Hospitalizations, surgeries, and ER visits in previous 12 months J.         [place] such as hearing and vision if needed, cognitive and depression L.         Referrals and appointments - sleep study   In addition, I have reviewed and discussed with patient certain preventive protocols, quality metrics, and best practice recommendations. A written personalized care plan for preventive services as well as general preventive health recommendations were provided to patient.  See attached scanned questionnaire for additional information.   No Follow-up on file.   refresh to show

## 2017-05-11 NOTE — Progress Notes (Deleted)
Name: Jimmy Green   MRN: [id number]    DOB: 06/27/1951   Date:05/11/2017       Progress Note  Subjective  Chief Complaint  Chief Complaint  Patient presents with  . Welcome to Medicare    HPI  *** Patient Active Problem List   Diagnosis Date Noted  . Primary localized osteoarthritis of left hip 04/06/2017  . Dyslipidemia associated with type 2 diabetes mellitus ([place]) 10/04/2016  . Trigger thumb of both thumbs 06/02/2016  . Chronic tension-type headache, intractable 09/25/2015  . Benign paroxysmal positional vertigo due to bilateral vestibular disorder 09/25/2015  . Hearing loss sensory, bilateral 08/26/2015  . Cerebral microvascular disease 08/26/2015  . Metabolic syndrome 30/04/2329  . Vertigo 06/21/2015  . Buzzing in ear 06/21/2015  . Hypertension goal BP (blood pressure) < 140/90 03/28/2015  . GERD (gastroesophageal reflux disease) 03/28/2015  . Hyperlipidemia 03/28/2015  . Depression, major 03/28/2015  . CAD in native artery 03/28/2015  . Actinic keratosis 02/14/2015  . Obstructive sleep apnea 02/03/2015  . Diverticulosis of colon without hemorrhage 01/31/2015  . Hx of colonic polyps   . Benign neoplasm of descending colon   . Benign neoplasm of sigmoid colon   . Idiopathic colitis   . Sebaceous cyst 09/14/2014    Past Surgical History:  Procedure Laterality Date  . BACK SURGERY    . BASAL CELL CARCINOMA EXCISION Left 12/02/2015   Chest-Done by Dermatologist   . CARDIAC CATHETERIZATION Left 04/01/2016   Procedure: Left Heart Cath and Coronary Angiography;  Surgeon: Teodoro Spray, MD;  Location: [facility];  Service: Cardiovascular;  Laterality: Left;  . CARDIAC CATHETERIZATION N/A 04/01/2016   Procedure: Intravascular Pressure Wire/FFR Study;  Surgeon: Isaias Cowman, MD;  Location: [facility];  Service: Cardiovascular;  Laterality: N/A;  . COLONOSCOPY N/A 01/15/2015   Wohl-ileitis, 2 benign polyps, cryptitis, sigmoid diverticulosis,  focal ulceration ICV  . CORONARY STENT PLACEMENT    . FRACTIONAL FLOW RESERVE WIRE  10/08/2011   Procedure: FRACTIONAL FLOW RESERVE WIRE;  Surgeon: Clent Demark, MD;  Location: [facility];  Service: Cardiovascular;;  . HERNIA REPAIR    . KNEE SURGERY    . LEFT HEART CATHETERIZATION WITH CORONARY ANGIOGRAM N/A 10/08/2011   Procedure: LEFT HEART CATHETERIZATION WITH CORONARY ANGIOGRAM;  Surgeon: Clent Demark, MD;  Location: [facility];  Service: Cardiovascular;  Laterality: N/A;  . TOTAL HIP ARTHROPLASTY Left 04/06/2017   Procedure: TOTAL HIP ARTHROPLASTY ANTERIOR APPROACH;  Surgeon: Hessie Knows, MD;  Location: [facility];  Service: Orthopedics;  Laterality: Left;    Family History  Problem Relation Age of Onset  . Lung cancer Father   . Heart disease Father   . Skin cancer Father     Social History   Social History  . Marital status: Married    Spouse name: N/A  . Number of children: N/A  . Years of education: N/A   Occupational History  . Not on file.   Social History Main Topics  . Smoking status: Former Smoker    Years: 40.00    Types: Cigarettes    Quit date: 08/03/2012  . Smokeless tobacco: Never Used  . Alcohol use No  . Drug use: No  . Sexual activity: Yes    Partners: Female   Other Topics Concern  . Not on file   Social History Narrative  . No narrative on file     Current Outpatient Prescriptions:  .  aspirin EC 81 MG  tablet, Take 81 mg by mouth every morning. , Disp: , Rfl:  .  bisacodyl (DULCOLAX) 10 MG suppository, Place 1 suppository (10 mg total) rectally daily as needed for moderate constipation., Disp: 12 suppository, Rfl: 0 .  Blood Glucose Monitoring Suppl (ACCU-CHEK AVIVA PLUS) w/Device KIT, 1 application by Does not apply route daily., Disp: 1 kit, Rfl: 0 .  buPROPion (WELLBUTRIN XL) 150 MG 24 hr tablet, Take 3 tablets (450 mg total) by mouth daily., Disp: 90 tablet, Rfl: 3 .  butalbital-aspirin-caffeine (FIORINAL) 50-325-40 MG capsule, Take 1  capsule by mouth every 6 (six) hours as needed for headache., Disp: 30 capsule, Rfl: 0 .  docusate sodium (COLACE) 100 MG capsule, Take 1 capsule (100 mg total) by mouth 2 (two) times daily., Disp: 10 capsule, Rfl: 0 .  escitalopram (LEXAPRO) 20 MG tablet, Take 1 tablet (20 mg total) by mouth daily., Disp: 30 tablet, Rfl: 3 .  glucose blood (ACCU-CHEK ACTIVE STRIPS) test strip, Use as instructed, Disp: 100 each, Rfl: 12 .  Lancets (ACCU-CHEK SOFT TOUCH) lancets, Use as instructed, Disp: 100 each, Rfl: 12 .  methocarbamol (ROBAXIN) 500 MG tablet, Take 1 tablet (500 mg total) by mouth every 6 (six) hours as needed for muscle spasms., Disp: 30 tablet, Rfl: 0 .  metoprolol succinate (TOPROL-XL) 50 MG 24 hr tablet, TAKE 1 TABLET BY MOUTH EVERY EVENING. (Patient taking differently: qam.), Disp: 30 tablet, Rfl: 3 .  nitroGLYCERIN (NITROSTAT) 0.4 MG SL tablet, Place 1 tablet (0.4 mg total) under the tongue every 5 (five) minutes as needed. For chest pain., Disp: 30 tablet, Rfl: 0 .  Omeprazole 20 MG TBEC, , Disp: , Rfl: 3 .  oxyCODONE (OXY IR/ROXICODONE) 5 MG immediate release tablet, Take 1-2 tablets (5-10 mg total) by mouth every 4 (four) hours as needed for breakthrough pain., Disp: 30 tablet, Rfl: 0 .  amLODipine (NORVASC) 2.5 MG tablet, Take 1 tablet (2.5 mg total) by mouth daily., Disp: 30 tablet, Rfl: 3 .  atorvastatin (LIPITOR) 40 MG tablet, Take 1 tablet (40 mg total) by mouth daily., Disp: 30 tablet, Rfl: 3 .  clindamycin (CLEOCIN) 300 MG capsule, , Disp: , Rfl: 0 .  losartan-hydrochlorothiazide (HYZAAR) 100-25 MG tablet, Take 1 tablet by mouth daily., Disp: 30 tablet, Rfl: 3 .  metFORMIN (GLUCOPHAGE-XR) 500 MG 24 hr tablet, Take 1 tablet (500 mg total) by mouth daily with breakfast., Disp: 30 tablet, Rfl: 3 .  omeprazole (PRILOSEC) 20 MG capsule, TAKE 1 CAPSULE BY MOUTH DAILY., Disp: 30 capsule, Rfl: 3  Allergies  Allergen Reactions  . Elavil [Amitriptyline Hcl] Rash  . Tape Rash    Paper  Tape     ROS  ***  Objective  Vitals:   05/11/17 1112  BP: 118/76  Pulse: 76  Resp: 16  Temp: 98.2 F (36.8 C)  TempSrc: Oral  SpO2: 98%  Weight: 211 lb 6.4 oz (95.9 kg)  Height: 5' 6.93" (1.7 m)    Body mass index is 33.18 kg/m.  Physical Exam ***    Diabetic Foot Exam: Diabetic Foot Exam - Simple   No data filed     ***  PHQ2/9: Depression screen [facility] 2/9 05/11/2017 10/15/2016 10/01/2016 07/02/2016 06/02/2016  Decreased Interest 0 2 1 0 0  Down, Depressed, Hopeless 0 3 0 0 0  PHQ - 2 Score 0 5 1 0 0  Altered sleeping - 0 - - -  Tired, decreased energy - 2 - - -  Change in appetite - 0 - - -  Feeling bad or failure about yourself  - 0 - - -  Trouble concentrating - 0 - - -  Moving slowly or fidgety/restless - 0 - - -  Suicidal thoughts - 0 - - -  PHQ-9 Score - 7 - - -  Difficult doing work/chores - Somewhat difficult - - -   ***  Fall Risk: Fall Risk  05/11/2017 10/15/2016 10/01/2016 07/02/2016 06/02/2016  Falls in the past year? _0   Number falls in past yr: - - - - -  Injury with Fall? - - - - -  Risk for fall due to : - - - - -  Follow up - - - - -   ***   Functional Status Survey: Is the patient deaf or have difficulty hearing?: No Does the patient have difficulty seeing, even when wearing glasses/contacts?: No Does the patient have difficulty concentrating, remembering, or making decisions?: No Does the patient have difficulty walking or climbing stairs?: No Does the patient have difficulty dressing or bathing?: No Does the patient have difficulty doing errands alone such as visiting a doctor's office or shopping?: No ***   Assessment & Plan  1. Welcome to Medicare preventive visit *** - EKG 12-Lead - Visual acuity screening  2. Dyslipidemia associated with type 2 diabetes mellitus (HCC) *** - metFORMIN (GLUCOPHAGE-XR) 500 MG 24 hr tablet; Take 1 tablet (500 mg total) by mouth daily with breakfast.  Dispense: 30 tablet;  Refill: 3  3. Hypertension, benign *** - losartan-hydrochlorothiazide (HYZAAR) 100-25 MG tablet; Take 1 tablet by mouth daily.  Dispense: 30 tablet; Refill: 3 - amLODipine (NORVASC) 2.5 MG tablet; Take 1 tablet (2.5 mg total) by mouth daily.  Dispense: 30 tablet; Refill: 3  4. Depression, major, recurrent, mild (HCC) ***  5. Mixed hyperlipidemia *** - atorvastatin (LIPITOR) 40 MG tablet; Take 1 tablet (40 mg total) by mouth daily.  Dispense: 30 tablet; Refill: 3  6. Needs flu shot *** - Flu vaccine HIGH DOSE PF  7. Obstructive sleep apnea *** - Nocturnal polysomnography (NPSG); Future

## 2017-05-11 NOTE — Patient Instructions (Signed)
Preventive Care 65 Years and Older, Male Preventive care refers to lifestyle choices and visits with your health care provider that can promote health and wellness. What does preventive care include?  A yearly physical exam. This is also called an annual well check.  Dental exams once or twice a year.  Routine eye exams. Ask your health care provider how often you should have your eyes checked.  Personal lifestyle choices, including: ? Daily care of your teeth and gums. ? Regular physical activity. ? Eating a healthy diet. ? Avoiding tobacco and drug use. ? Limiting alcohol use. ? Practicing safe sex. ? Taking low doses of aspirin every day. ? Taking vitamin and mineral supplements as recommended by your health care provider. What happens during an annual well check? The services and screenings done by your health care provider during your annual well check will depend on your age, overall health, lifestyle risk factors, and family history of disease. Counseling Your health care provider may ask you questions about your:  Alcohol use.  Tobacco use.  Drug use.  Emotional well-being.  Home and relationship well-being.  Sexual activity.  Eating habits.  History of falls.  Memory and ability to understand (cognition).  Work and work environment.  Screening You may have the following tests or measurements:  Height, weight, and BMI.  Blood pressure.  Lipid and cholesterol levels. These may be checked every 5 years, or more frequently if you are over 50 years old.  Skin check.  Lung cancer screening. You may have this screening every year starting at age 55 if you have a 30-pack-year history of smoking and currently smoke or have quit within the past 15 years.  Fecal occult blood test (FOBT) of the stool. You may have this test every year starting at age 50.  Flexible sigmoidoscopy or colonoscopy. You may have a sigmoidoscopy every 5 years or a colonoscopy every 10  years starting at age 50.  Prostate cancer screening. Recommendations will vary depending on your family history and other risks.  Hepatitis C blood test.  Hepatitis B blood test.  Sexually transmitted disease (STD) testing.  Diabetes screening. This is done by checking your blood sugar (glucose) after you have not eaten for a while (fasting). You may have this done every 1-3 years.  Abdominal aortic aneurysm (AAA) screening. You may need this if you are a current or former smoker.  Osteoporosis. You may be screened starting at age 70 if you are at high risk.  Talk with your health care provider about your test results, treatment options, and if necessary, the need for more tests. Vaccines Your health care provider may recommend certain vaccines, such as:  Influenza vaccine. This is recommended every year.  Tetanus, diphtheria, and acellular pertussis (Tdap, Td) vaccine. You may need a Td booster every 10 years.  Varicella vaccine. You may need this if you have not been vaccinated.  Zoster vaccine. You may need this after age 60.  Measles, mumps, and rubella (MMR) vaccine. You may need at least one dose of MMR if you were born in 1957 or later. You may also need a second dose.  Pneumococcal 13-valent conjugate (PCV13) vaccine. One dose is recommended after age 65.  Pneumococcal polysaccharide (PPSV23) vaccine. One dose is recommended after age 65.  Meningococcal vaccine. You may need this if you have certain conditions.  Hepatitis A vaccine. You may need this if you have certain conditions or if you travel or work in places where you   may be exposed to hepatitis A.  Hepatitis B vaccine. You may need this if you have certain conditions or if you travel or work in places where you may be exposed to hepatitis B.  Haemophilus influenzae type b (Hib) vaccine. You may need this if you have certain risk factors.  Talk to your health care provider about which screenings and vaccines  you need and how often you need them. This information is not intended to replace advice given to you by your health care provider. Make sure you discuss any questions you have with your health care provider. Document Released: 08/16/2015 Document Revised: 04/08/2016 Document Reviewed: 05/21/2015 Elsevier Interactive Patient Education  2017 [employer].

## 2017-05-18 ENCOUNTER — Ambulatory Visit (INDEPENDENT_AMBULATORY_CARE_PROVIDER_SITE_OTHER): Payer: Medicare Other

## 2017-05-18 ENCOUNTER — Other Ambulatory Visit: Payer: Self-pay | Admitting: Family Medicine

## 2017-05-18 DIAGNOSIS — Z0000 Encounter for general adult medical examination without abnormal findings: Secondary | ICD-10-CM

## 2017-05-18 DIAGNOSIS — Z87891 Personal history of nicotine dependence: Secondary | ICD-10-CM

## 2017-05-18 DIAGNOSIS — Z136 Encounter for screening for cardiovascular disorders: Secondary | ICD-10-CM | POA: Diagnosis not present

## 2017-05-20 ENCOUNTER — Telehealth: Payer: Self-pay | Admitting: Family Medicine

## 2017-05-20 NOTE — Telephone Encounter (Signed)
Pt SAID THAT ON HIS VISIT A WEEK AGO HE SPOKE WITH YOU AT HIS APPT ABOUT HAVING A LOT OF MOUTH SORES. THE WIFE SAID THAT THEY ARE VERY BIG IN SIZE AND ARE SO PAINFUL THAT HE CAN NOT EVEN EAT AND JUST GOES TO BED FROM THE PAIN. COULD YOU CALL HIM SOMETHING IN. HAS TRIED ZINC AND THAT HELPED SOME BUT  HAS MADE HIM SICK EVEN IF HE HAD ATE SOMETHING. PLEASE ADVISE.

## 2017-05-21 ENCOUNTER — Ambulatory Visit (INDEPENDENT_AMBULATORY_CARE_PROVIDER_SITE_OTHER): Payer: Medicare Other | Admitting: Family Medicine

## 2017-05-21 ENCOUNTER — Encounter: Payer: Self-pay | Admitting: Family Medicine

## 2017-05-21 VITALS — BP 130/72 | HR 76 | Temp 98.1°F | Resp 16 | Ht 67.0 in | Wt 212.8 lb

## 2017-05-21 DIAGNOSIS — E538 Deficiency of other specified B group vitamins: Secondary | ICD-10-CM | POA: Diagnosis not present

## 2017-05-21 DIAGNOSIS — K120 Recurrent oral aphthae: Secondary | ICD-10-CM

## 2017-05-21 DIAGNOSIS — Z96642 Presence of left artificial hip joint: Secondary | ICD-10-CM | POA: Diagnosis not present

## 2017-05-21 MED ORDER — MAGIC MOUTHWASH W/LIDOCAINE: 2.0000 mL | Freq: Three times a day (TID) | ORAL | Status: DC | PRN

## 2017-05-21 MED ORDER — CYANOCOBALAMIN 1000 MCG/ML IJ SOLN
1000.0000 ug | Freq: Once | INTRAMUSCULAR | Status: AC
  Administered 2017-05-21: 1000 ug via INTRAMUSCULAR

## 2017-05-21 NOTE — Progress Notes (Signed)
Name: Jimmy Green   MRN: [id number]    DOB: [birth year]   Date:05/21/2017       Progress Note  Subjective  Chief Complaint  Chief Complaint  Patient presents with  . Mouth Lesions    ulcers on tongue for 1 month, painful    HPI  Patient presents with complaint of small white tongue lesions - painful spots show up for 2-3 days at a time; those heal, and then new ones form.  He describes them as very painful and making it difficult to eat.  Denies fevers/chills, nausea, vomiting, diarrhea; GERD has been well controlled with Omeprazole; no headaches, no tooth/jaw pain, no gum swelling.  He tried warm saltwater and this did not help; used peroxide last night and it just hurt.  Former smoker - quit 4 years ago; no chewing tobacco use.  Patient Active Problem List   Diagnosis Date Noted  . Primary localized osteoarthritis of left hip 04/06/2017  . Dyslipidemia associated with type 2 diabetes mellitus ([place]) 10/04/2016  . Trigger thumb of both thumbs 06/02/2016  . Chronic tension-type headache, intractable 09/25/2015  . Benign paroxysmal positional vertigo due to bilateral vestibular disorder 09/25/2015  . Hearing loss sensory, bilateral 08/26/2015  . Cerebral microvascular disease 08/26/2015  . Metabolic syndrome 38/05/1750  . Vertigo 06/21/2015  . Buzzing in ear 06/21/2015  . Hypertension goal BP (blood pressure) < 140/90 03/28/2015  . GERD (gastroesophageal reflux disease) 03/28/2015  . Hyperlipidemia 03/28/2015  . Depression, major 03/28/2015  . CAD in native artery 03/28/2015  . Actinic keratosis 02/14/2015  . Obstructive sleep apnea 02/03/2015  . Diverticulosis of colon without hemorrhage 01/31/2015  . Hx of colonic polyps   . Benign neoplasm of descending colon   . Benign neoplasm of sigmoid colon   . Idiopathic colitis   . Sebaceous cyst 09/14/2014    Social History  Substance Use Topics  . Smoking status: Former Smoker    Years: 40.00    Types: Cigarettes    Quit  date: 08/03/2012  . Smokeless tobacco: Never Used  . Alcohol use No     Current Outpatient Prescriptions:  .  amLODipine (NORVASC) 2.5 MG tablet, Take 1 tablet (2.5 mg total) by mouth daily., Disp: 30 tablet, Rfl: 3 .  aspirin EC 81 MG tablet, Take 81 mg by mouth every morning. , Disp: , Rfl:  .  atorvastatin (LIPITOR) 40 MG tablet, Take 1 tablet (40 mg total) by mouth daily., Disp: 30 tablet, Rfl: 3 .  bisacodyl (DULCOLAX) 10 MG suppository, Place 1 suppository (10 mg total) rectally daily as needed for moderate constipation., Disp: 12 suppository, Rfl: 0 .  Blood Glucose Monitoring Suppl (ACCU-CHEK AVIVA PLUS) w/Device KIT, 1 application by Does not apply route daily., Disp: 1 kit, Rfl: 0 .  buPROPion (WELLBUTRIN XL) 150 MG 24 hr tablet, Take 3 tablets (450 mg total) by mouth daily., Disp: 90 tablet, Rfl: 3 .  butalbital-aspirin-caffeine (FIORINAL) 50-325-40 MG capsule, Take 1 capsule by mouth every 6 (six) hours as needed for headache., Disp: 30 capsule, Rfl: 0 .  clindamycin (CLEOCIN) 300 MG capsule, , Disp: , Rfl: 0 .  docusate sodium (COLACE) 100 MG capsule, Take 1 capsule (100 mg total) by mouth 2 (two) times daily., Disp: 10 capsule, Rfl: 0 .  escitalopram (LEXAPRO) 20 MG tablet, Take 1 tablet (20 mg total) by mouth daily., Disp: 30 tablet, Rfl: 3 .  glucose blood (ACCU-CHEK ACTIVE STRIPS) test strip, Use as instructed, Disp: 100 each, Rfl:  12 .  Lancets (ACCU-CHEK SOFT TOUCH) lancets, Use as instructed, Disp: 100 each, Rfl: 12 .  losartan-hydrochlorothiazide (HYZAAR) 100-25 MG tablet, Take 1 tablet by mouth daily., Disp: 30 tablet, Rfl: 3 .  metFORMIN (GLUCOPHAGE-XR) 500 MG 24 hr tablet, Take 1 tablet (500 mg total) by mouth daily with breakfast., Disp: 30 tablet, Rfl: 3 .  methocarbamol (ROBAXIN) 500 MG tablet, Take 1 tablet (500 mg total) by mouth every 6 (six) hours as needed for muscle spasms., Disp: 30 tablet, Rfl: 0 .  metoprolol succinate (TOPROL-XL) 50 MG 24 hr tablet, TAKE 1  TABLET BY MOUTH EVERY EVENING. (Patient taking differently: qam.), Disp: 30 tablet, Rfl: 3 .  nitroGLYCERIN (NITROSTAT) 0.4 MG SL tablet, Place 1 tablet (0.4 mg total) under the tongue every 5 (five) minutes as needed. For chest pain., Disp: 30 tablet, Rfl: 0 .  omeprazole (PRILOSEC) 20 MG capsule, TAKE 1 CAPSULE BY MOUTH DAILY., Disp: 30 capsule, Rfl: 3 .  Omeprazole 20 MG TBEC, , Disp: , Rfl: 3 .  oxyCODONE (OXY IR/ROXICODONE) 5 MG immediate release tablet, Take 1-2 tablets (5-10 mg total) by mouth every 4 (four) hours as needed for breakthrough pain., Disp: 30 tablet, Rfl: 0  Allergies  Allergen Reactions  . Elavil [Amitriptyline Hcl] Rash  . Tape Rash    Paper Tape    ROS  Constitutional: Negative for fever or weight change.  Respiratory: Negative for cough and shortness of breath.  HEENT: See HPI  Cardiovascular: Negative for chest pain or palpitations.  Gastrointestinal: Negative for abdominal pain, no bowel changes.  Musculoskeletal: Negative for gait problem or joint swelling.  Skin: Negative for rash.  Neurological: Negative for dizziness or headache.  No other specific complaints in a complete review of systems (except as listed in HPI above).  Objective  Vitals:   05/21/17 1034  BP: 130/72  Pulse: 76  Resp: 16  Temp: 98.1 F (36.7 C)  TempSrc: Oral  SpO2: 96%  Weight: 212 lb 12.8 oz (96.5 kg)  Height: _0  (1.702 m)   Body mass index is 33.33 kg/m.  Nursing Note and Vital Signs reviewed.  Physical Exam  Constitutional: Patient appears well-developed and well-nourished. Obese. No distress.  HEENT: head atraumatic, normocephalic.  Small apthous ulcers present on tip of tongue, right and left lateral sides of tongue; no buccal mucosa, gum, soft/hard palate involvement. Cardiovascular: Normal rate, regular rhythm, S1/S2 present.  No murmur or rub heard. No BLE edema. Pulmonary/Chest: Effort normal and breath sounds clear. No respiratory distress or  retractions. Psychiatric: Patient has a normal mood and affect. behavior is normal. Judgment and thought content normal.  Recent Results (from the past 2160 hour(s))  HM DIABETES EYE EXAM     Status: None   Collection Time: 03/22/17 12:00 AM  Result Value Ref Range   HM Diabetic Eye Exam No Retinopathy No Retinopathy  CBC     Status: None   Collection Time: 03/31/17  8:57 AM  Result Value Ref Range   WBC 8.2 3.8 - 10.6 K/uL   RBC 4.99 4.40 - 5.90 MIL/uL   Hemoglobin 15.6 13.0 - 18.0 g/dL   HCT 45.6 40.0 - 52.0 %   MCV 91.4 80.0 - 100.0 fL   MCH 31.3 26.0 - 34.0 pg   MCHC 34.2 32.0 - 36.0 g/dL   RDW 13.7 11.5 - 14.5 %   Platelets 295 150 - 440 K/uL  Sedimentation rate     Status: None   Collection Time: 03/31/17  8:57 AM  Result Value Ref Range   Sed Rate 6 0 - 20 mm/hr  Basic metabolic panel     Status: Abnormal   Collection Time: 03/31/17  8:57 AM  Result Value Ref Range   Sodium 138 135 - 145 mmol/L   Potassium 3.4 (L) 3.5 - 5.1 mmol/L   Chloride 103 101 - 111 mmol/L   CO2 28 22 - 32 mmol/L   Glucose, Bld 117 (H) 65 - 99 mg/dL   BUN 10 6 - 20 mg/dL   Creatinine, Ser 0.68 0.61 - 1.24 mg/dL   Calcium 9.7 8.9 - 10.3 mg/dL   GFR calc non Af Amer >60 >60 mL/min   GFR calc Af Amer >60 >60 mL/min    Comment: (NOTE) The eGFR has been calculated using the CKD EPI equation. This calculation has not been validated in all clinical situations. eGFR's persistently <60 mL/min signify possible Chronic Kidney Disease.    Anion gap 7 5 - 15  Protime-INR     Status: None   Collection Time: 03/31/17  8:57 AM  Result Value Ref Range   Prothrombin Time 14.6 11.4 - 15.2 seconds   INR 1.15   APTT     Status: None   Collection Time: 03/31/17  8:57 AM  Result Value Ref Range   aPTT 35 24 - 36 seconds  Urinalysis, Complete w Microscopic     Status: Abnormal   Collection Time: 03/31/17  8:57 AM  Result Value Ref Range   Color, Urine STRAW (A) YELLOW   APPearance CLEAR (A) CLEAR    Specific Gravity, Urine 1.008 1.005 - 1.030   pH 8.0 5.0 - 8.0   Glucose, UA NEGATIVE NEGATIVE mg/dL   Hgb urine dipstick NEGATIVE NEGATIVE   Bilirubin Urine NEGATIVE NEGATIVE   Ketones, ur NEGATIVE NEGATIVE mg/dL   Protein, ur NEGATIVE NEGATIVE mg/dL   Nitrite NEGATIVE NEGATIVE   Leukocytes, UA NEGATIVE NEGATIVE   RBC / HPF NONE SEEN 0 - 5 RBC/hpf   WBC, UA 0-5 0 - 5 WBC/hpf   Bacteria, UA NONE SEEN NONE SEEN   Squamous Epithelial / LPF NONE SEEN NONE SEEN  Urine culture     Status: None   Collection Time: 03/31/17  8:57 AM  Result Value Ref Range   Specimen Description URINE, CLEAN CATCH    Special Requests NONE    Culture      NO GROWTH Performed at [facility], [address], [place], [place] [address]    Report Status 04/01/2017 FINAL   Surgical pcr screen     Status: Abnormal   Collection Time: 03/31/17  8:57 AM  Result Value Ref Range   MRSA, PCR POSITIVE (A) NEGATIVE    Comment: RESULT CALLED TO, READ BACK BY AND VERIFIED WITH: KAREN DIXON 03/31/17 1316 KLW    Staphylococcus aureus POSITIVE (A) NEGATIVE    Comment: (NOTE) The Xpert SA Assay (FDA approved for NASAL specimens in patients 71 years of age and older), is one component of a comprehensive surveillance program. It is not intended to diagnose infection nor to guide or monitor treatment.   Type and screen [place]     Status: None   Collection Time: 03/31/17  8:57 AM  Result Value Ref Range   ABO/RH(D) O POS    Antibody Screen NEG    Sample Expiration 04/14/2017    Extend sample reason NO TRANSFUSIONS OR PREGNANCY IN THE PAST 3 MONTHS   Glucose, capillary  Status: Abnormal   Collection Time: 04/06/17  7:48 AM  Result Value Ref Range   Glucose-Capillary 130 (H) 65 - 99 mg/dL  ABO/Rh     Status: None   Collection Time: 04/06/17  8:07 AM  Result Value Ref Range   ABO/RH(D) O POS   Surgical pathology     Status: None   Collection Time: 04/06/17  9:39 AM  Result Value  Ref Range   SURGICAL PATHOLOGY      Surgical Pathology CASE: [id number] PATIENT: [name] Surgical Pathology Report     SPECIMEN SUBMITTED: A. Femoral head, left  CLINICAL HISTORY: None provided  PRE-OPERATIVE DIAGNOSIS: Primary osteoarthritis of left hip  POST-OPERATIVE DIAGNOSIS: Same as pre op     DIAGNOSIS: A. LEFT FEMORAL HEAD; ARTHROPLASTY: - OSTEOARTHRITIS.   GROSS DESCRIPTION:  A. Labeled: left femoral head  Size of specimen:      Head -4.5 x 4.6 cm      Neck -4.0 x 2.4 cm  Articular surface: Tan with focal granularity  Cut surface: red-brown  Other findings: none noted  Block summary: 1 - representative section(s), post decalcification    Final Diagnosis performed by Delorse Lek, MD.  Electronically signed 04/08/2017 4:59:01PM    The electronic signature indicates that the named Attending Pathologist has evaluated the specimen  Technical component performed at [facility], [address] [place], [place] [address] Lab: [phone] Dir: Darrick Penna. Veleta Miners D  Professional component performed at [facility], [facility], [place], [place],  [address] Lab: [phone] Dir: Dellia Nims. Rubinas, MD    Glucose, capillary     Status: Abnormal   Collection Time: 04/06/17 12:16 PM  Result Value Ref Range   Glucose-Capillary 104 (H) 65 - 99 mg/dL  CBC     Status: Abnormal   Collection Time: 04/06/17  4:40 PM  Result Value Ref Range   WBC 16.1 (H) 3.8 - 10.6 K/uL   RBC 4.16 (L) 4.40 - 5.90 MIL/uL   Hemoglobin 13.2 13.0 - 18.0 g/dL   HCT 38.3 (L) 40.0 - 52.0 %   MCV 92.1 80.0 - 100.0 fL   MCH 31.7 26.0 - 34.0 pg   MCHC 34.4 32.0 - 36.0 g/dL   RDW 13.7 11.5 - 14.5 %   Platelets 223 150 - 440 K/uL  Creatinine, serum     Status: None   Collection Time: 04/06/17  4:40 PM  Result Value Ref Range   Creatinine, Ser 0.64 0.61 - 1.24 mg/dL   GFR calc non Af Amer >60 >60 mL/min   GFR calc Af Amer >60 >60 mL/min     Comment: (NOTE) The eGFR has been calculated using the CKD EPI equation. This calculation has not been validated in all clinical situations. eGFR's persistently <60 mL/min signify possible Chronic Kidney Disease.   Glucose, capillary     Status: Abnormal   Collection Time: 04/06/17  5:03 PM  Result Value Ref Range   Glucose-Capillary 129 (H) 65 - 99 mg/dL  Glucose, capillary     Status: Abnormal   Collection Time: 04/06/17  8:55 PM  Result Value Ref Range   Glucose-Capillary 153 (H) 65 - 99 mg/dL   Comment 1 Notify RN   CBC     Status: Abnormal   Collection Time: 04/07/17  3:32 AM  Result Value Ref Range   WBC 10.9 (H) 3.8 - 10.6 K/uL   RBC 3.73 (L) 4.40 - 5.90 MIL/uL   Hemoglobin 11.9 (L) 13.0 - 18.0 g/dL  HCT 33.7 (L) 40.0 - 52.0 %   MCV 90.3 80.0 - 100.0 fL   MCH 31.9 26.0 - 34.0 pg   MCHC 35.3 32.0 - 36.0 g/dL   RDW 13.7 11.5 - 14.5 %   Platelets 205 150 - 440 K/uL  Basic metabolic panel     Status: Abnormal   Collection Time: 04/07/17  3:32 AM  Result Value Ref Range   Sodium 135 135 - 145 mmol/L   Potassium 3.5 3.5 - 5.1 mmol/L   Chloride 104 101 - 111 mmol/L   CO2 24 22 - 32 mmol/L   Glucose, Bld 131 (H) 65 - 99 mg/dL   BUN 14 6 - 20 mg/dL   Creatinine, Ser 0.67 0.61 - 1.24 mg/dL   Calcium 8.3 (L) 8.9 - 10.3 mg/dL   GFR calc non Af Amer >60 >60 mL/min   GFR calc Af Amer >60 >60 mL/min    Comment: (NOTE) The eGFR has been calculated using the CKD EPI equation. This calculation has not been validated in all clinical situations. eGFR's persistently <60 mL/min signify possible Chronic Kidney Disease.    Anion gap 7 5 - 15  Glucose, capillary     Status: Abnormal   Collection Time: 04/07/17  7:42 AM  Result Value Ref Range   Glucose-Capillary 105 (H) 65 - 99 mg/dL  Glucose, capillary     Status: None   Collection Time: 04/07/17 12:04 PM  Result Value Ref Range   Glucose-Capillary 98 65 - 99 mg/dL  Glucose, capillary     Status: Abnormal   Collection Time:  04/07/17  4:32 PM  Result Value Ref Range   Glucose-Capillary 106 (H) 65 - 99 mg/dL  Glucose, capillary     Status: Abnormal   Collection Time: 04/07/17  9:10 PM  Result Value Ref Range   Glucose-Capillary 109 (H) 65 - 99 mg/dL   Comment 1 Notify RN   CBC     Status: Abnormal   Collection Time: 04/08/17  3:51 AM  Result Value Ref Range   WBC 13.8 (H) 3.8 - 10.6 K/uL   RBC 3.71 (L) 4.40 - 5.90 MIL/uL   Hemoglobin 11.5 (L) 13.0 - 18.0 g/dL   HCT 33.7 (L) 40.0 - 52.0 %   MCV 90.9 80.0 - 100.0 fL   MCH 31.0 26.0 - 34.0 pg   MCHC 34.1 32.0 - 36.0 g/dL   RDW 13.7 11.5 - 14.5 %   Platelets 204 150 - 440 K/uL  Basic metabolic panel     Status: Abnormal   Collection Time: 04/08/17  3:51 AM  Result Value Ref Range   Sodium 134 (L) 135 - 145 mmol/L   Potassium 3.2 (L) 3.5 - 5.1 mmol/L   Chloride 99 (L) 101 - 111 mmol/L   CO2 27 22 - 32 mmol/L   Glucose, Bld 132 (H) 65 - 99 mg/dL   BUN 20 6 - 20 mg/dL   Creatinine, Ser 0.79 0.61 - 1.24 mg/dL   Calcium 8.7 (L) 8.9 - 10.3 mg/dL   GFR calc non Af Amer >60 >60 mL/min   GFR calc Af Amer >60 >60 mL/min    Comment: (NOTE) The eGFR has been calculated using the CKD EPI equation. This calculation has not been validated in all clinical situations. eGFR's persistently <60 mL/min signify possible Chronic Kidney Disease.    Anion gap 8 5 - 15  Glucose, capillary     Status: Abnormal   Collection Time: 04/08/17  7:36 AM  Result Value Ref Range   Glucose-Capillary 114 (H) 65 - 99 mg/dL  Glucose, capillary     Status: None   Collection Time: 04/08/17 11:34 AM  Result Value Ref Range   Glucose-Capillary 92 65 - 99 mg/dL  Glucose, capillary     Status: None   Collection Time: 04/08/17  4:29 PM  Result Value Ref Range   Glucose-Capillary 88 65 - 99 mg/dL  Glucose, capillary     Status: Abnormal   Collection Time: 04/08/17  9:17 PM  Result Value Ref Range   Glucose-Capillary 114 (H) 65 - 99 mg/dL   Comment 1 Notify RN   Basic metabolic panel      Status: Abnormal   Collection Time: 04/09/17  4:03 AM  Result Value Ref Range   Sodium 135 135 - 145 mmol/L   Potassium 3.3 (L) 3.5 - 5.1 mmol/L   Chloride 100 (L) 101 - 111 mmol/L   CO2 29 22 - 32 mmol/L   Glucose, Bld 112 (H) 65 - 99 mg/dL   BUN 22 (H) 6 - 20 mg/dL   Creatinine, Ser 0.79 0.61 - 1.24 mg/dL   Calcium 8.7 (L) 8.9 - 10.3 mg/dL   GFR calc non Af Amer >60 >60 mL/min   GFR calc Af Amer >60 >60 mL/min    Comment: (NOTE) The eGFR has been calculated using the CKD EPI equation. This calculation has not been validated in all clinical situations. eGFR's persistently <60 mL/min signify possible Chronic Kidney Disease.    Anion gap 6 5 - 15  CBC     Status: Abnormal   Collection Time: 04/09/17  4:03 AM  Result Value Ref Range   WBC 10.5 3.8 - 10.6 K/uL   RBC 3.39 (L) 4.40 - 5.90 MIL/uL   Hemoglobin 10.6 (L) 13.0 - 18.0 g/dL   HCT 30.7 (L) 40.0 - 52.0 %   MCV 90.4 80.0 - 100.0 fL   MCH 31.2 26.0 - 34.0 pg   MCHC 34.6 32.0 - 36.0 g/dL   RDW 13.7 11.5 - 14.5 %   Platelets 207 150 - 440 K/uL  Glucose, capillary     Status: Abnormal   Collection Time: 04/09/17  7:42 AM  Result Value Ref Range   Glucose-Capillary 105 (H) 65 - 99 mg/dL  Glucose, capillary     Status: Abnormal   Collection Time: 04/09/17 11:24 AM  Result Value Ref Range   Glucose-Capillary 111 (H) 65 - 99 mg/dL     Assessment & Plan  1. Ulcer aphthous oral - magic mouthwash w/lidocaine; Take 2-5 mLs by mouth 3 (three) times daily as needed for mouth pain. - See tips sheet on AVS for discussion of other solutions.  -Red flags and when to present for emergency care or RTC including fever >101.49F, chest pain, shortness of breath, new/worsening/un-resolving symptoms, reviewed with patient at time of visit. Follow up and care instructions discussed and provided in AVS. Return if symptoms worsen or fail to improve, for 2 Weeks.

## 2017-05-21 NOTE — Telephone Encounter (Signed)
COMING IN AT 11:20 TO SEE EMILY

## 2017-05-21 NOTE — Patient Instructions (Addendum)
Tips for Apthous Ulcers - "Canker Sores"  - Drink plenty of water - Switch your toothpaste to something without Sodium [place] Sulfate (Such as [employer] Total Care) - "Kanka Stick" - Benzacaine Stick is available over the counter and can be used to numb your ulcer temporarily - Biotene mouthwash can help to keep your mouth moisturized - Warm Saltwater Rinses can soothe your pain - Avoid acidic foods like tomato-based products and citrus fruits. - L-Lysine - 1000mg  Daily   Canker Sores Canker sores are small, painful sores that develop inside your mouth. They may also be called aphthous ulcers. You can get canker sores on the inside of your lips or cheeks, on your tongue, or anywhere inside your mouth. You can have just one canker sore or several of them. Canker sores cannot be passed from one person to another (noncontagious). These sores are different than the sores that you may get on the outside of your lips (cold sores or fever blisters). Canker sores usually start as painful red bumps. Then they turn into small white, yellow, or gray ulcers that have red borders. The ulcers may be quite painful. The pain may be worse when you eat or drink. What are the causes? The cause of this condition is not known. What increases the risk? This condition is more likely to develop in:  Women.  People in their teens or 67s.  Women who are having their menstrual period.  People who are under a lot of emotional stress.  People who do not get enough iron or B vitamins.  People who have poor oral hygiene.  People who have an injury inside the mouth. This can happen after having dental work or from chewing something hard.  What are the signs or symptoms? Along with the canker sore, symptoms may also include:  Fever.  Fatigue.  Swollen lymph nodes in your neck.  How is this diagnosed? This condition can be diagnosed based on your symptoms. Your health care provider will also examine your mouth.  Your health care provider may also do tests if you get canker sores often or if they are very bad. Tests may include:  Blood tests to rule out other causes of canker sores.  Taking swabs from the sore to check for infection.  Taking a small piece of skin from the sore (biopsy) to test it for cancer.  How is this treated? Most canker sores clear up without treatment in about 10 days. Home care is usually the only treatment that you will need. Over-the-counter medicines can relieve discomfort.If you have severe canker sores, your health care provider may prescribe:  Numbing ointment to relieve pain.  Vitamins.  Steroid medicines. These may be given as: ? Oral pills. ? Mouth rinses. ? Gels.  Antibiotic mouth rinse.  Follow these instructions at home:  Apply, take, or use medicines only as directed by your health care provider. These include vitamins.  If you were prescribed an antibiotic mouth rinse, finish all of it even if you start to feel better.  Until the sores are healed: ? Do not drink coffee or citrus juices. ? Do not eat spicy or salty foods.  Use a mild, over-the-counter mouth rinse as directed by your health care provider.  Practice good oral hygiene. ? Floss your teeth every day. ? Brush your teeth with a soft brush twice each day. Contact a health care provider if:  Your symptoms do not get better after two weeks.  You also have a  fever or swollen glands.  You get canker sores often.  You have a canker sore that is getting larger.  You cannot eat or drink due to your canker sores. This information is not intended to replace advice given to you by your health care provider. Make sure you discuss any questions you have with your health care provider. Document Released: 11/14/2010 Document Revised: 12/26/2015 Document Reviewed: 06/20/2014 Elsevier Interactive Patient Education  [employer].

## 2017-05-21 NOTE — Telephone Encounter (Signed)
I think he should come in to be seen, I don't see anything on my exam.

## 2017-06-18 ENCOUNTER — Ambulatory Visit (INDEPENDENT_AMBULATORY_CARE_PROVIDER_SITE_OTHER): Payer: Medicare Other | Admitting: Family Medicine

## 2017-06-18 ENCOUNTER — Encounter: Payer: Self-pay | Admitting: Family Medicine

## 2017-06-18 VITALS — BP 118/80 | HR 71 | Temp 98.0°F | Resp 18 | Ht 67.0 in | Wt 211.4 lb

## 2017-06-18 DIAGNOSIS — K120 Recurrent oral aphthae: Secondary | ICD-10-CM

## 2017-06-18 DIAGNOSIS — E538 Deficiency of other specified B group vitamins: Secondary | ICD-10-CM | POA: Diagnosis not present

## 2017-06-18 DIAGNOSIS — G4733 Obstructive sleep apnea (adult) (pediatric): Secondary | ICD-10-CM

## 2017-06-18 MED ORDER — CYANOCOBALAMIN 1000 MCG/ML IJ SOLN
1000.0000 ug | Freq: Once | INTRAMUSCULAR | Status: AC
  Administered 2017-06-18: 1000 ug via INTRAMUSCULAR

## 2017-06-18 NOTE — Progress Notes (Signed)
Name: Jimmy Green   MRN: [id number]    DOB: 1950-08-06   Date:06/18/2017       Progress Note  Subjective  Chief Complaint  Chief Complaint  Patient presents with  . Follow-up    still have mouth lesions    HPI  Patient presents to follow up on recurrent aphthous ulcers.  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  He reports he'll get one, it will heal, and another will come up. Currently has one on the right side under his tongue - states it is starting to heal and is less painful.  OSA: Patient has OSA and does not wear his mask. He was referred back in March 2018, but was unable to go.  We will check on this today.  Patient Active Problem List   Diagnosis Date Noted  . Primary localized osteoarthritis of left hip 04/06/2017  . Dyslipidemia associated with type 2 diabetes mellitus ([place]) 10/04/2016  . Trigger thumb of both thumbs 06/02/2016  . Chronic tension-type headache, intractable 09/25/2015  . Benign paroxysmal positional vertigo due to bilateral vestibular disorder 09/25/2015  . Hearing loss sensory, bilateral 08/26/2015  . Cerebral microvascular disease 08/26/2015  . Metabolic syndrome 56/43/3295  . Vertigo 06/21/2015  . Buzzing in ear 06/21/2015  . Hypertension goal BP (blood pressure) < 140/90 03/28/2015  . GERD (gastroesophageal reflux disease) 03/28/2015  . Hyperlipidemia 03/28/2015  . Depression, major 03/28/2015  . CAD in native artery 03/28/2015  . Actinic keratosis 02/14/2015  . Obstructive sleep apnea 02/03/2015  . Diverticulosis of colon without hemorrhage 01/31/2015  . Hx of colonic polyps   . Benign neoplasm of descending colon   . Benign neoplasm of sigmoid colon   . Idiopathic colitis   . Sebaceous cyst 09/14/2014    Social History   Tobacco Use  . Smoking status: Former Smoker    Years: 40.00    Types: Cigarettes    Last attempt to quit: 08/03/2012    Years since quitting: 4.8  . Smokeless  tobacco: Never Used  Substance Use Topics  . Alcohol use: No    Alcohol/week: 0.0 oz     Current Outpatient Medications:  .  amLODipine (NORVASC) 2.5 MG tablet, Take 1 tablet (2.5 mg total) by mouth daily., Disp: 30 tablet, Rfl: 3 .  aspirin EC 81 MG tablet, Take 81 mg by mouth every morning. , Disp: , Rfl:  .  atorvastatin (LIPITOR) 40 MG tablet, Take 1 tablet (40 mg total) by mouth daily., Disp: 30 tablet, Rfl: 3 .  bisacodyl (DULCOLAX) 10 MG suppository, Place 1 suppository (10 mg total) rectally daily as needed for moderate constipation., Disp: 12 suppository, Rfl: 0 .  Blood Glucose Monitoring Suppl (ACCU-CHEK AVIVA PLUS) w/Device KIT, 1 application by Does not apply route daily., Disp: 1 kit, Rfl: 0 .  buPROPion (WELLBUTRIN XL) 150 MG 24 hr tablet, Take 3 tablets (450 mg total) by mouth daily., Disp: 90 tablet, Rfl: 3 .  butalbital-aspirin-caffeine (FIORINAL) 50-325-40 MG capsule, Take 1 capsule by mouth every 6 (six) hours as needed for headache., Disp: 30 capsule, Rfl: 0 .  clindamycin (CLEOCIN) 300 MG capsule, , Disp: , Rfl: 0 .  docusate sodium (COLACE) 100 MG capsule, Take 1 capsule (100 mg total) by mouth 2 (two) times daily., Disp: 10 capsule, Rfl: 0 .  escitalopram (LEXAPRO) 20 MG tablet, Take 1 tablet (20 mg total) by mouth daily., Disp: 30 tablet, Rfl: 3 .  glucose  blood (ACCU-CHEK ACTIVE STRIPS) test strip, Use as instructed, Disp: 100 each, Rfl: 12 .  Lancets (ACCU-CHEK SOFT TOUCH) lancets, Use as instructed, Disp: 100 each, Rfl: 12 .  losartan-hydrochlorothiazide (HYZAAR) 100-25 MG tablet, Take 1 tablet by mouth daily., Disp: 30 tablet, Rfl: 3 .  metFORMIN (GLUCOPHAGE-XR) 500 MG 24 hr tablet, Take 1 tablet (500 mg total) by mouth daily with breakfast., Disp: 30 tablet, Rfl: 3 .  methocarbamol (ROBAXIN) 500 MG tablet, Take 1 tablet (500 mg total) by mouth every 6 (six) hours as needed for muscle spasms., Disp: 30 tablet, Rfl: 0 .  metoprolol succinate (TOPROL-XL) 50 MG 24 hr  tablet, TAKE 1 TABLET BY MOUTH EVERY EVENING. (Patient taking differently: qam.), Disp: 30 tablet, Rfl: 3 .  nitroGLYCERIN (NITROSTAT) 0.4 MG SL tablet, Place 1 tablet (0.4 mg total) under the tongue every 5 (five) minutes as needed. For chest pain., Disp: 30 tablet, Rfl: 0 .  omeprazole (PRILOSEC) 20 MG capsule, TAKE 1 CAPSULE BY MOUTH DAILY., Disp: 30 capsule, Rfl: 3 .  Omeprazole 20 MG TBEC, , Disp: , Rfl: 3 .  oxyCODONE (OXY IR/ROXICODONE) 5 MG immediate release tablet, Take 1-2 tablets (5-10 mg total) by mouth every 4 (four) hours as needed for breakthrough pain., Disp: 30 tablet, Rfl: 0  Current Facility-Administered Medications:  .  magic mouthwash w/lidocaine, 2-5 mL, Oral, TID PRN, Hubbard Hartshorn, FNP  Allergies  Allergen Reactions  . Elavil [Amitriptyline Hcl] Rash  . Tape Rash    Paper Tape    ROS  Constitutional: Negative for fever or weight change.  Respiratory: Negative for cough and shortness of breath.   Cardiovascular: Negative for chest pain or palpitations.  Gastrointestinal: Negative for abdominal pain, no bowel changes.  Musculoskeletal: Negative for gait problem or joint swelling.  Skin: Negative for rash.  Neurological: Negative for dizziness or headache.  No other specific complaints in a complete review of systems (except as listed in HPI above).  Objective  Vitals:   06/18/17 1015  BP: 118/80  Pulse: 71  Resp: 18  Temp: 98 F (36.7 C)  TempSrc: Oral  SpO2: 97%  Weight: 211 lb 6.4 oz (95.9 kg)  Height: '5\' 7"'  (1.702 m)   Body mass index is 33.11 kg/m.  Nursing Note and Vital Signs reviewed.  Physical Exam Constitutional: Patient appears well-developed and well-nourished. Obese No distress.  HEENT: head atraumatic, neck supple without lymphadenopathy, oropharynx pink and moist without exudate.  Right side, under the tongue has oblong white ulceration with surrounding erythema consistent with aphthous ulcer. Cardiovascular: Normal rate, regular  rhythm, S1/S2 present.  No murmur or rub heard. No BLE edema. Pulmonary/Chest: Effort normal and breath sounds clear. No respiratory distress or retractions. Psychiatric: Patient has a normal mood and affect. behavior is normal. Judgment and thought content normal.  Recent Results (from the past 2160 hour(s))  HM DIABETES EYE EXAM     Status: None   Collection Time: 03/22/17 12:00 AM  Result Value Ref Range   HM Diabetic Eye Exam No Retinopathy No Retinopathy  CBC     Status: None   Collection Time: 03/31/17  8:57 AM  Result Value Ref Range   WBC 8.2 3.8 - 10.6 K/uL   RBC 4.99 4.40 - 5.90 MIL/uL   Hemoglobin 15.6 13.0 - 18.0 g/dL   HCT 45.6 40.0 - 52.0 %   MCV 91.4 80.0 - 100.0 fL   MCH 31.3 26.0 - 34.0 pg   MCHC 34.2 32.0 - 36.0 g/dL  RDW 13.7 11.5 - 14.5 %   Platelets 295 150 - 440 K/uL  Sedimentation rate     Status: None   Collection Time: 03/31/17  8:57 AM  Result Value Ref Range   Sed Rate 6 0 - 20 mm/hr  Basic metabolic panel     Status: Abnormal   Collection Time: 03/31/17  8:57 AM  Result Value Ref Range   Sodium 138 135 - 145 mmol/L   Potassium 3.4 (L) 3.5 - 5.1 mmol/L   Chloride 103 101 - 111 mmol/L   CO2 28 22 - 32 mmol/L   Glucose, Bld 117 (H) 65 - 99 mg/dL   BUN 10 6 - 20 mg/dL   Creatinine, Ser 0.68 0.61 - 1.24 mg/dL   Calcium 9.7 8.9 - 10.3 mg/dL   GFR calc non Af Amer >60 >60 mL/min   GFR calc Af Amer >60 >60 mL/min    Comment: (NOTE) The eGFR has been calculated using the CKD EPI equation. This calculation has not been validated in all clinical situations. eGFR's persistently <60 mL/min signify possible Chronic Kidney Disease.    Anion gap 7 5 - 15  Protime-INR     Status: None   Collection Time: 03/31/17  8:57 AM  Result Value Ref Range   Prothrombin Time 14.6 11.4 - 15.2 seconds   INR 1.15   APTT     Status: None   Collection Time: 03/31/17  8:57 AM  Result Value Ref Range   aPTT 35 24 - 36 seconds  Urinalysis, Complete w Microscopic      Status: Abnormal   Collection Time: 03/31/17  8:57 AM  Result Value Ref Range   Color, Urine STRAW (A) YELLOW   APPearance CLEAR (A) CLEAR   Specific Gravity, Urine 1.008 1.005 - 1.030   pH 8.0 5.0 - 8.0   Glucose, UA NEGATIVE NEGATIVE mg/dL   Hgb urine dipstick NEGATIVE NEGATIVE   Bilirubin Urine NEGATIVE NEGATIVE   Ketones, ur NEGATIVE NEGATIVE mg/dL   Protein, ur NEGATIVE NEGATIVE mg/dL   Nitrite NEGATIVE NEGATIVE   Leukocytes, UA NEGATIVE NEGATIVE   RBC / HPF NONE SEEN 0 - 5 RBC/hpf   WBC, UA 0-5 0 - 5 WBC/hpf   Bacteria, UA NONE SEEN NONE SEEN   Squamous Epithelial / LPF NONE SEEN NONE SEEN  Urine culture     Status: None   Collection Time: 03/31/17  8:57 AM  Result Value Ref Range   Specimen Description URINE, CLEAN CATCH    Special Requests NONE    Culture      NO GROWTH Performed at [facility], [address], [place], [place] [address]    Report Status 04/01/2017 FINAL   Surgical pcr screen     Status: Abnormal   Collection Time: 03/31/17  8:57 AM  Result Value Ref Range   MRSA, PCR POSITIVE (A) NEGATIVE    Comment: RESULT CALLED TO, READ BACK BY AND VERIFIED WITH: KAREN DIXON 03/31/17 1316 KLW    Staphylococcus aureus POSITIVE (A) NEGATIVE    Comment: (NOTE) The Xpert SA Assay (FDA approved for NASAL specimens in patients 32 years of age and older), is one component of a comprehensive surveillance program. It is not intended to diagnose infection nor to guide or monitor treatment.   Type and screen [place]     Status: None   Collection Time: 03/31/17  8:57 AM  Result Value Ref Range   ABO/RH(D) O POS  Antibody Screen NEG    Sample Expiration 04/14/2017    Extend sample reason NO TRANSFUSIONS OR PREGNANCY IN THE PAST 3 MONTHS   Glucose, capillary     Status: Abnormal   Collection Time: 04/06/17  7:48 AM  Result Value Ref Range   Glucose-Capillary 130 (H) 65 - 99 mg/dL  ABO/Rh     Status: None   Collection Time: 04/06/17   8:07 AM  Result Value Ref Range   ABO/RH(D) O POS   Surgical pathology     Status: None   Collection Time: 04/06/17  9:39 AM  Result Value Ref Range   SURGICAL PATHOLOGY      Surgical Pathology CASE: [id number] PATIENT: [name] Surgical Pathology Report     SPECIMEN SUBMITTED: A. Femoral head, left  CLINICAL HISTORY: None provided  PRE-OPERATIVE DIAGNOSIS: Primary osteoarthritis of left hip  POST-OPERATIVE DIAGNOSIS: Same as pre op     DIAGNOSIS: A. LEFT FEMORAL HEAD; ARTHROPLASTY: - OSTEOARTHRITIS.   GROSS DESCRIPTION:  A. Labeled: left femoral head  Size of specimen:      Head -4.5 x 4.6 cm      Neck -4.0 x 2.4 cm  Articular surface: Tan with focal granularity  Cut surface: red-brown  Other findings: none noted  Block summary: 1 - representative section(s), post decalcification    Final Diagnosis performed by Delorse Lek, MD.  Electronically signed 04/08/2017 4:59:01PM    The electronic signature indicates that the named Attending Pathologist has evaluated the specimen  Technical component performed at [facility], [address], [place], [place] [address] Lab: [phone] Dir: Darrick Penna. Veleta Miners D  Professional component performed at [facility], [facility], [place], [place], [place] [address] Lab: [phone] Dir: Dellia Nims. Rubinas, MD    Glucose, capillary     Status: Abnormal   Collection Time: 04/06/17 12:16 PM  Result Value Ref Range   Glucose-Capillary 104 (H) 65 - 99 mg/dL  CBC     Status: Abnormal   Collection Time: 04/06/17  4:40 PM  Result Value Ref Range   WBC 16.1 (H) 3.8 - 10.6 K/uL   RBC 4.16 (L) 4.40 - 5.90 MIL/uL   Hemoglobin 13.2 13.0 - 18.0 g/dL   HCT 38.3 (L) 40.0 - 52.0 %   MCV 92.1 80.0 - 100.0 fL   MCH 31.7 26.0 - 34.0 pg   MCHC 34.4 32.0 - 36.0 g/dL   RDW 13.7 11.5 - 14.5 %   Platelets 223 150 - 440 K/uL  Creatinine, serum     Status: None   Collection Time: 04/06/17  4:40 PM   Result Value Ref Range   Creatinine, Ser 0.64 0.61 - 1.24 mg/dL   GFR calc non Af Amer >60 >60 mL/min   GFR calc Af Amer >60 >60 mL/min    Comment: (NOTE) The eGFR has been calculated using the CKD EPI equation. This calculation has not been validated in all clinical situations. eGFR's persistently <60 mL/min signify possible Chronic Kidney Disease.   Glucose, capillary     Status: Abnormal   Collection Time: 04/06/17  5:03 PM  Result Value Ref Range   Glucose-Capillary 129 (H) 65 - 99 mg/dL  Glucose, capillary     Status: Abnormal   Collection Time: 04/06/17  8:55 PM  Result Value Ref Range   Glucose-Capillary 153 (H) 65 - 99 mg/dL   Comment 1 Notify RN   CBC     Status: Abnormal   Collection Time: 04/07/17  3:32 AM  Result Value Ref Range   WBC 10.9 (H) 3.8 - 10.6 K/uL   RBC 3.73 (L) 4.40 - 5.90 MIL/uL   Hemoglobin 11.9 (L) 13.0 - 18.0 g/dL   HCT 33.7 (L) 40.0 - 52.0 %   MCV 90.3 80.0 - 100.0 fL   MCH 31.9 26.0 - 34.0 pg   MCHC 35.3 32.0 - 36.0 g/dL   RDW 13.7 11.5 - 14.5 %   Platelets 205 150 - 440 K/uL  Basic metabolic panel     Status: Abnormal   Collection Time: 04/07/17  3:32 AM  Result Value Ref Range   Sodium 135 135 - 145 mmol/L   Potassium 3.5 3.5 - 5.1 mmol/L   Chloride 104 101 - 111 mmol/L   CO2 24 22 - 32 mmol/L   Glucose, Bld 131 (H) 65 - 99 mg/dL   BUN 14 6 - 20 mg/dL   Creatinine, Ser 0.67 0.61 - 1.24 mg/dL   Calcium 8.3 (L) 8.9 - 10.3 mg/dL   GFR calc non Af Amer >60 >60 mL/min   GFR calc Af Amer >60 >60 mL/min    Comment: (NOTE) The eGFR has been calculated using the CKD EPI equation. This calculation has not been validated in all clinical situations. eGFR's persistently <60 mL/min signify possible Chronic Kidney Disease.    Anion gap 7 5 - 15  Glucose, capillary     Status: Abnormal   Collection Time: 04/07/17  7:42 AM  Result Value Ref Range   Glucose-Capillary 105 (H) 65 - 99 mg/dL  Glucose, capillary     Status: None   Collection Time:  04/07/17 12:04 PM  Result Value Ref Range   Glucose-Capillary 98 65 - 99 mg/dL  Glucose, capillary     Status: Abnormal   Collection Time: 04/07/17  4:32 PM  Result Value Ref Range   Glucose-Capillary 106 (H) 65 - 99 mg/dL  Glucose, capillary     Status: Abnormal   Collection Time: 04/07/17  9:10 PM  Result Value Ref Range   Glucose-Capillary 109 (H) 65 - 99 mg/dL   Comment 1 Notify RN   CBC     Status: Abnormal   Collection Time: 04/08/17  3:51 AM  Result Value Ref Range   WBC 13.8 (H) 3.8 - 10.6 K/uL   RBC 3.71 (L) 4.40 - 5.90 MIL/uL   Hemoglobin 11.5 (L) 13.0 - 18.0 g/dL   HCT 33.7 (L) 40.0 - 52.0 %   MCV 90.9 80.0 - 100.0 fL   MCH 31.0 26.0 - 34.0 pg   MCHC 34.1 32.0 - 36.0 g/dL   RDW 13.7 11.5 - 14.5 %   Platelets 204 150 - 440 K/uL  Basic metabolic panel     Status: Abnormal   Collection Time: 04/08/17  3:51 AM  Result Value Ref Range   Sodium 134 (L) 135 - 145 mmol/L   Potassium 3.2 (L) 3.5 - 5.1 mmol/L   Chloride 99 (L) 101 - 111 mmol/L   CO2 27 22 - 32 mmol/L   Glucose, Bld 132 (H) 65 - 99 mg/dL   BUN 20 6 - 20 mg/dL   Creatinine, Ser 0.79 0.61 - 1.24 mg/dL   Calcium 8.7 (L) 8.9 - 10.3 mg/dL   GFR calc non Af Amer >60 >60 mL/min   GFR calc Af Amer >60 >60 mL/min    Comment: (NOTE) The eGFR has been calculated using the CKD EPI equation. This calculation has not been validated in all clinical situations. eGFR's persistently <  60 mL/min signify possible Chronic Kidney Disease.    Anion gap 8 5 - 15  Glucose, capillary     Status: Abnormal   Collection Time: 04/08/17  7:36 AM  Result Value Ref Range   Glucose-Capillary 114 (H) 65 - 99 mg/dL  Glucose, capillary     Status: None   Collection Time: 04/08/17 11:34 AM  Result Value Ref Range   Glucose-Capillary 92 65 - 99 mg/dL  Glucose, capillary     Status: None   Collection Time: 04/08/17  4:29 PM  Result Value Ref Range   Glucose-Capillary 88 65 - 99 mg/dL  Glucose, capillary     Status: Abnormal    Collection Time: 04/08/17  9:17 PM  Result Value Ref Range   Glucose-Capillary 114 (H) 65 - 99 mg/dL   Comment 1 Notify RN   Basic metabolic panel     Status: Abnormal   Collection Time: 04/09/17  4:03 AM  Result Value Ref Range   Sodium 135 135 - 145 mmol/L   Potassium 3.3 (L) 3.5 - 5.1 mmol/L   Chloride 100 (L) 101 - 111 mmol/L   CO2 29 22 - 32 mmol/L   Glucose, Bld 112 (H) 65 - 99 mg/dL   BUN 22 (H) 6 - 20 mg/dL   Creatinine, Ser 0.79 0.61 - 1.24 mg/dL   Calcium 8.7 (L) 8.9 - 10.3 mg/dL   GFR calc non Af Amer >60 >60 mL/min   GFR calc Af Amer >60 >60 mL/min    Comment: (NOTE) The eGFR has been calculated using the CKD EPI equation. This calculation has not been validated in all clinical situations. eGFR's persistently <60 mL/min signify possible Chronic Kidney Disease.    Anion gap 6 5 - 15  CBC     Status: Abnormal   Collection Time: 04/09/17  4:03 AM  Result Value Ref Range   WBC 10.5 3.8 - 10.6 K/uL   RBC 3.39 (L) 4.40 - 5.90 MIL/uL   Hemoglobin 10.6 (L) 13.0 - 18.0 g/dL   HCT 30.7 (L) 40.0 - 52.0 %   MCV 90.4 80.0 - 100.0 fL   MCH 31.2 26.0 - 34.0 pg   MCHC 34.6 32.0 - 36.0 g/dL   RDW 13.7 11.5 - 14.5 %   Platelets 207 150 - 440 K/uL  Glucose, capillary     Status: Abnormal   Collection Time: 04/09/17  7:42 AM  Result Value Ref Range   Glucose-Capillary 105 (H) 65 - 99 mg/dL  Glucose, capillary     Status: Abnormal   Collection Time: 04/09/17 11:24 AM  Result Value Ref Range   Glucose-Capillary 111 (H) 65 - 99 mg/dL     Assessment & Plan  1. Ulcer aphthous oral L-Lysine - start taking this supplement for a few weeks and see if this helps with your ulcers.  Cool mist humidifier at night to help moisturize the air.  Drink plenty of fluids, avoid caffeine (coffee, soda, etc), get plenty of rest, work on lowering your stress levels.  Continue to use mouthwash as needed and toothpaste without sodium-laurel-sulfate.  Watch for ulcers that do not heal and/or  are not painful - return if this occurs.  2. Obstructive sleep apnea - Patient given telephone number for [facility] to call. Referral Expires June 28, 2017. Pt is aware and will call.   -Red flags and when to present for emergency care or RTC including fever >101.99F, if ulcer does not heal or is non-painful, new/worsening/un-resolving symptoms, reviewed  with patient at time of visit. Follow up and care instructions discussed and provided in AVS.

## 2017-06-18 NOTE — Patient Instructions (Addendum)
L-Lysine - start taking this supplement for a few weeks and see if this helps with your ulcers.  Cool mist humidifier at night to help moisturize the air.  Drink plenty of fluids, avoid caffeine (coffee, soda, etc), get plenty of rest, work on lowering your stress levels.  Continue to use mouthwash as needed and toothpaste without sodium-laurel-sulfate.  Watch for ulcers that do not heal and/or are not painful - return if this occurs.   Please Call [facility] at [phone] to schedule your sleep study.

## 2017-06-23 ENCOUNTER — Other Ambulatory Visit: Payer: Self-pay | Admitting: Family Medicine

## 2017-06-23 DIAGNOSIS — F330 Major depressive disorder, recurrent, mild: Secondary | ICD-10-CM

## 2017-07-03 ENCOUNTER — Other Ambulatory Visit: Payer: Self-pay | Admitting: Family Medicine

## 2017-07-03 DIAGNOSIS — F330 Major depressive disorder, recurrent, mild: Secondary | ICD-10-CM

## 2017-07-07 NOTE — Addendum Note (Signed)
Addendum  created 07/07/17 0947 by Alvin Critchley, MD   Intraprocedure Blocks edited, Sign clinical note

## 2017-07-23 ENCOUNTER — Ambulatory Visit (INDEPENDENT_AMBULATORY_CARE_PROVIDER_SITE_OTHER): Payer: Medicare Other

## 2017-07-23 DIAGNOSIS — D519 Vitamin B12 deficiency anemia, unspecified: Secondary | ICD-10-CM | POA: Diagnosis not present

## 2017-07-23 MED ORDER — CYANOCOBALAMIN 1000 MCG/ML IJ SOLN
1000.0000 ug | Freq: Once | INTRAMUSCULAR | Status: AC
  Administered 2017-07-23: 1000 ug via INTRAMUSCULAR

## 2017-07-26 ENCOUNTER — Other Ambulatory Visit: Payer: Self-pay | Admitting: Family Medicine

## 2017-07-26 DIAGNOSIS — I2510 Atherosclerotic heart disease of native coronary artery without angina pectoris: Secondary | ICD-10-CM

## 2017-07-26 DIAGNOSIS — I10 Essential (primary) hypertension: Secondary | ICD-10-CM

## 2017-07-29 DIAGNOSIS — M545 Low back pain: Secondary | ICD-10-CM | POA: Diagnosis not present

## 2017-07-29 DIAGNOSIS — Z96642 Presence of left artificial hip joint: Secondary | ICD-10-CM | POA: Diagnosis not present

## 2017-07-29 DIAGNOSIS — Z5181 Encounter for therapeutic drug level monitoring: Secondary | ICD-10-CM | POA: Diagnosis not present

## 2017-07-29 DIAGNOSIS — M9904 Segmental and somatic dysfunction of sacral region: Secondary | ICD-10-CM | POA: Diagnosis not present

## 2017-08-02 ENCOUNTER — Other Ambulatory Visit: Payer: Self-pay | Admitting: Family Medicine

## 2017-08-02 DIAGNOSIS — F330 Major depressive disorder, recurrent, mild: Secondary | ICD-10-CM

## 2017-08-02 NOTE — Telephone Encounter (Signed)
Refill request for general medication: Wellbutrin 150 mg  Last office visit: 06/18/2017  Last physical exam: 05/11/2017  Follow up visit: 08/18/2017

## 2017-08-05 DIAGNOSIS — G4733 Obstructive sleep apnea (adult) (pediatric): Secondary | ICD-10-CM | POA: Diagnosis not present

## 2017-08-05 DIAGNOSIS — I2510 Atherosclerotic heart disease of native coronary artery without angina pectoris: Secondary | ICD-10-CM | POA: Diagnosis not present

## 2017-08-05 DIAGNOSIS — E782 Mixed hyperlipidemia: Secondary | ICD-10-CM | POA: Diagnosis not present

## 2017-08-09 DIAGNOSIS — Z96642 Presence of left artificial hip joint: Secondary | ICD-10-CM | POA: Diagnosis not present

## 2017-08-09 DIAGNOSIS — E1140 Type 2 diabetes mellitus with diabetic neuropathy, unspecified: Secondary | ICD-10-CM | POA: Diagnosis not present

## 2017-08-09 DIAGNOSIS — Z5181 Encounter for therapeutic drug level monitoring: Secondary | ICD-10-CM | POA: Diagnosis not present

## 2017-08-09 DIAGNOSIS — M545 Low back pain: Secondary | ICD-10-CM | POA: Diagnosis not present

## 2017-08-12 DIAGNOSIS — G4733 Obstructive sleep apnea (adult) (pediatric): Secondary | ICD-10-CM | POA: Diagnosis not present

## 2017-08-12 DIAGNOSIS — R0602 Shortness of breath: Secondary | ICD-10-CM | POA: Diagnosis not present

## 2017-08-13 DIAGNOSIS — R0602 Shortness of breath: Secondary | ICD-10-CM | POA: Diagnosis not present

## 2017-08-13 DIAGNOSIS — G4733 Obstructive sleep apnea (adult) (pediatric): Secondary | ICD-10-CM | POA: Diagnosis not present

## 2017-08-18 ENCOUNTER — Encounter: Payer: Self-pay | Admitting: Family Medicine

## 2017-08-18 ENCOUNTER — Ambulatory Visit (INDEPENDENT_AMBULATORY_CARE_PROVIDER_SITE_OTHER): Payer: Medicare HMO | Admitting: Family Medicine

## 2017-08-18 VITALS — BP 120/80 | HR 64 | Resp 16 | Ht 67.0 in | Wt 216.2 lb

## 2017-08-18 DIAGNOSIS — E1169 Type 2 diabetes mellitus with other specified complication: Secondary | ICD-10-CM | POA: Diagnosis not present

## 2017-08-18 DIAGNOSIS — G4733 Obstructive sleep apnea (adult) (pediatric): Secondary | ICD-10-CM | POA: Diagnosis not present

## 2017-08-18 DIAGNOSIS — E782 Mixed hyperlipidemia: Secondary | ICD-10-CM

## 2017-08-18 DIAGNOSIS — I10 Essential (primary) hypertension: Secondary | ICD-10-CM | POA: Diagnosis not present

## 2017-08-18 DIAGNOSIS — E538 Deficiency of other specified B group vitamins: Secondary | ICD-10-CM | POA: Diagnosis not present

## 2017-08-18 DIAGNOSIS — F330 Major depressive disorder, recurrent, mild: Secondary | ICD-10-CM

## 2017-08-18 DIAGNOSIS — K219 Gastro-esophageal reflux disease without esophagitis: Secondary | ICD-10-CM | POA: Diagnosis not present

## 2017-08-18 DIAGNOSIS — I2510 Atherosclerotic heart disease of native coronary artery without angina pectoris: Secondary | ICD-10-CM | POA: Diagnosis not present

## 2017-08-18 DIAGNOSIS — E785 Hyperlipidemia, unspecified: Secondary | ICD-10-CM

## 2017-08-18 LAB — POCT GLYCOSYLATED HEMOGLOBIN (HGB A1C): Hemoglobin A1C: 6.5

## 2017-08-18 MED ORDER — CYANOCOBALAMIN 1000 MCG/ML IJ SOLN
1000.0000 ug | Freq: Once | INTRAMUSCULAR | Status: AC
Start: 2017-08-18 — End: 2017-08-18
  Administered 2017-08-18: 1000 ug via INTRAMUSCULAR

## 2017-08-18 MED ORDER — GLUCOSE BLOOD VI STRP: ORAL_STRIP | 12 refills | Status: DC

## 2017-08-18 MED ORDER — ATORVASTATIN CALCIUM 40 MG PO TABS: 40.0000 mg | ORAL_TABLET | Freq: Every day | ORAL | 1 refills | Status: DC

## 2017-08-18 MED ORDER — METOPROLOL SUCCINATE ER 50 MG PO TB24: 50.0000 mg | ORAL_TABLET | Freq: Every evening | ORAL | 1 refills | Status: DC

## 2017-08-18 MED ORDER — AMLODIPINE BESYLATE 2.5 MG PO TABS: 2.5000 mg | ORAL_TABLET | Freq: Every day | ORAL | 1 refills | Status: DC

## 2017-08-18 MED ORDER — ACCU-CHEK SOFT TOUCH LANCETS MISC: 12 refills | Status: DC

## 2017-08-18 MED ORDER — LOSARTAN POTASSIUM-HCTZ 100-25 MG PO TABS: 1.0000 | ORAL_TABLET | Freq: Every day | ORAL | 1 refills | Status: DC

## 2017-08-18 MED ORDER — BUPROPION HCL ER (XL) 150 MG PO TB24: 450.0000 mg | ORAL_TABLET | Freq: Every day | ORAL | 1 refills | Status: DC

## 2017-08-18 MED ORDER — ESCITALOPRAM OXALATE 20 MG PO TABS: 20.0000 mg | ORAL_TABLET | Freq: Every day | ORAL | 1 refills | Status: DC

## 2017-08-18 MED ORDER — NITROGLYCERIN 0.4 MG SL SUBL: 0.4000 mg | SUBLINGUAL_TABLET | SUBLINGUAL | 0 refills | Status: DC | PRN

## 2017-08-18 MED ORDER — OMEPRAZOLE 20 MG PO CPDR: 20.0000 mg | DELAYED_RELEASE_CAPSULE | Freq: Every day | ORAL | 1 refills | Status: DC

## 2017-08-18 MED ORDER — METFORMIN HCL ER 500 MG PO TB24: 500.0000 mg | ORAL_TABLET | Freq: Every day | ORAL | 1 refills | Status: DC

## 2017-08-18 NOTE — Progress Notes (Signed)
Name: Jimmy Green   MRN: [id number]    DOB: [birth year]   Date:08/18/2017       Progress Note  Subjective  Chief Complaint  Chief Complaint  Patient presents with  . Diabetes  . Hypertension    HPI  Depression Major:  taking medication daily, no suicidal thoughts or ideation, he is still having pain and is now going to pain clinic for left hip ( s/p replacement) .   Dyslipidemia with type II DM: he denies polyphagia, polydipsia or polyuria. His hgbA1C was 6.7% diagnosed March 2018, hgbA1C down to 6.5 today. Explained importance of life style modification and diabetic class ( he refuses ), exercise - but cannot due much now because of hip probllems, aspirin daily, ARB and statin therapy.  Eye exam is up to date. Taking metformin as recommended.   HTN: bp is at goal , she has been taking medication daily. BP has been controlled still around 130/70's. He denies chest pain, palpitation or dizziness  Hyperlipidemia: taking Atorvastatin and denies side effects. No myalgia.    OSA: he got a CPAP machine that he got from a friend and waswearing 4 times weekly, but not over the past 12 months because the mask was not fitting properly. He saw Dr. Ubaldo Glassing and had sleep study this month, will go back Monday to discuss results.   History of basal cell carcinoma: Seeing Dermatologist at [facility].   CAD: taking aspirin, statin and beta blocker and ARB,and he has NTG in his pocket. Nochest pain or decrease in exercise tolerance. He was seen by Dr. Ubaldo Glassing in 08/2017, and had another heart cath that showed no significant disease, 55-70% stenosis on the LAD , on medical management, metoprolol, losartan, aspirin.  Left hip replacement history : still having pain, seeing Dr Primus Bravo now to control pain   Patient Active Problem List   Diagnosis Date Noted  . Primary localized osteoarthritis of left hip 04/06/2017  . Dyslipidemia associated with type 2 diabetes mellitus ([place]) 10/04/2016  .  Trigger thumb of both thumbs 06/02/2016  . Chronic tension-type headache, intractable 09/25/2015  . Benign paroxysmal positional vertigo due to bilateral vestibular disorder 09/25/2015  . Hearing loss sensory, bilateral 08/26/2015  . Cerebral microvascular disease 08/26/2015  . Metabolic syndrome 49/67/5916  . Vertigo 06/21/2015  . Buzzing in ear 06/21/2015  . Hypertension goal BP (blood pressure) < 140/90 03/28/2015  . GERD (gastroesophageal reflux disease) 03/28/2015  . Hyperlipidemia 03/28/2015  . Depression, major 03/28/2015  . CAD in native artery 03/28/2015  . Actinic keratosis 02/14/2015  . Obstructive sleep apnea 02/03/2015  . Diverticulosis of colon without hemorrhage 01/31/2015  . Hx of colonic polyps   . Benign neoplasm of descending colon   . Benign neoplasm of sigmoid colon   . Idiopathic colitis   . Sebaceous cyst 09/14/2014    Past Surgical History:  Procedure Laterality Date  . BACK SURGERY    . BASAL CELL CARCINOMA EXCISION Left 12/02/2015   Chest-Done by Dermatologist   . CARDIAC CATHETERIZATION Left 04/01/2016   Procedure: Left Heart Cath and Coronary Angiography;  Surgeon: Teodoro Spray, MD;  Location: [place] [facility];  Service: Cardiovascular;  Laterality: Left;  . CARDIAC CATHETERIZATION N/A 04/01/2016   Procedure: Intravascular Pressure Wire/FFR Study;  Surgeon: Isaias Cowman, MD;  Location: [place] [facility];  Service: Cardiovascular;  Laterality: N/A;  . COLONOSCOPY N/A 01/15/2015   Wohl-ileitis, 2 benign polyps, cryptitis, sigmoid diverticulosis, focal ulceration ICV  . CORONARY STENT PLACEMENT    .  FRACTIONAL FLOW RESERVE WIRE  10/08/2011   Procedure: FRACTIONAL FLOW RESERVE WIRE;  Surgeon: Clent Demark, MD;  Location: [facility] C[facility];  Service: Cardiovascular;;  . HERNIA REPAIR    . KNEE SURGERY    . LEFT HEART CATHETERIZATION WITH CORONARY ANGIOGRAM N/A 10/08/2011   Procedure: LEFT HEART CATHETERIZATION WITH CORONARY ANGIOGRAM;  Surgeon:  Clent Demark, MD;  Location: [place] [facility]ATH [facility];  Service: Cardiovascular;  Laterality: N/A;  . TOTAL HIP ARTHROPLASTY Left 04/06/2017   Procedure: TOTAL HIP ARTHROPLASTY ANTERIOR APPROACH;  Surgeon: Hessie Knows, MD;  Location: [facility] OR[facility];  Service: Orthopedics;  Laterality: Left;    Family History  Problem Relation Age of Onset  . Lung cancer Father   . Heart disease Father   . Skin cancer Father     Social History   Socioeconomic History  . Marital status: Married    Spouse name: Not on file  . Number of children: Not on file  . Years of education: Not on file  . Highest education level: Not on file  Social Needs  . Financial resource strain: Not on file  . Food insecurity - worry: Not on file  . Food insecurity - inability: Not on file  . Transportation needs - medical: Not on file  . Transportation needs - non-medical: Not on file  Occupational History  . Not on file  Tobacco Use  . Smoking status: Former Smoker    Years: 40.00    Types: Cigarettes    Last attempt to quit: 08/03/2012    Years since quitting: 5.0  . Smokeless tobacco: Never Used  Substance and Sexual Activity  . Alcohol use: No    Alcohol/week: 0.0 oz  . Drug use: No  . Sexual activity: Yes    Partners: Female  Other Topics Concern  . Not on file  Social History Narrative  . Not on file     Current Outpatient Medications:  .  amLODipine (NORVASC) 2.5 MG tablet, Take 1 tablet (2.5 mg total) by mouth daily., Disp: 90 tablet, Rfl: 1 .  aspirin EC 81 MG tablet, Take 81 mg by mouth every morning. , Disp: , Rfl:  .  atorvastatin (LIPITOR) 40 MG tablet, Take 1 tablet (40 mg total) by mouth daily., Disp: 90 tablet, Rfl: 1 .  bisacodyl (DULCOLAX) 10 MG suppository, Place 1 suppository (10 mg total) rectally daily as needed for moderate constipation., Disp: 12 suppository, Rfl: 0 .  buPROPion (WELLBUTRIN XL) 150 MG 24 hr tablet, Take 3 tablets (450 mg total) by mouth daily., Disp: 270 tablet, Rfl: 1 .  docusate  sodium (COLACE) 100 MG capsule, Take 1 capsule (100 mg total) by mouth 2 (two) times daily., Disp: 10 capsule, Rfl: 0 .  escitalopram (LEXAPRO) 20 MG tablet, Take 1 tablet (20 mg total) by mouth daily., Disp: 90 tablet, Rfl: 1 .  glucose blood (ACCU-CHEK ACTIVE STRIPS) test strip, Use as instructed, Disp: 100 each, Rfl: 12 .  Lancets (ACCU-CHEK SOFT TOUCH) lancets, Use as instructed, Disp: 100 each, Rfl: 12 .  losartan-hydrochlorothiazide (HYZAAR) 100-25 MG tablet, Take 1 tablet by mouth daily., Disp: 90 tablet, Rfl: 1 .  metFORMIN (GLUCOPHAGE-XR) 500 MG 24 hr tablet, Take 1 tablet (500 mg total) by mouth daily with breakfast., Disp: 90 tablet, Rfl: 1 .  metoprolol succinate (TOPROL-XL) 50 MG 24 hr tablet, Take 1 tablet (50 mg total) by mouth every evening. Take with or immediately following a meal., Disp: 90 tablet, Rfl: 1 .  nitroGLYCERIN (  NITROSTAT) 0.4 MG SL tablet, Place 1 tablet (0.4 mg total) under the tongue every 5 (five) minutes as needed. For chest pain., Disp: 30 tablet, Rfl: 0 .  omeprazole (PRILOSEC) 20 MG capsule, Take 1 capsule (20 mg total) by mouth daily., Disp: 90 capsule, Rfl: 1 .  oxyCODONE (OXY IR/ROXICODONE) 5 MG immediate release tablet, Take 1-2 tablets (5-10 mg total) by mouth every 4 (four) hours as needed for breakthrough pain., Disp: 30 tablet, Rfl: 0  Allergies  Allergen Reactions  . Elavil [Amitriptyline Hcl] Rash  . Tape Rash    Paper Tape     ROS  Constitutional: Negative for fever or weight change.  Respiratory: Negative for cough and shortness of breath.   Cardiovascular: Negative for chest pain or palpitations.  Gastrointestinal: Negative for abdominal pain, no bowel changes.  Musculoskeletal: Positive  for gait problem from recent hip replacement but no  joint swelling.  Skin: Negative for rash.  Neurological: Negative for dizziness or headache.  No other specific complaints in a complete review of systems (except as listed in HPI  above).  Objective  Vitals:   08/18/17 1034  BP: 120/80  Pulse: 64  Resp: 16  SpO2: 97%  Weight: 216 lb 3.2 oz (98.1 kg)  Height: 5\' 7"  (1.702 m)    Body mass index is 33.86 kg/m.  Physical Exam  Constitutional: Patient appears well-developed and well-nourished. Obese No distress.  HEENT: head atraumatic, normocephalic, pupils equal and reactive to light,neck supple, throat within normal limits Cardiovascular: Normal rate, regular rhythm and normal heart sounds.  No murmur heard. No BLE edema. Pulmonary/Chest: Effort normal and breath sounds normal. No respiratory distress. Abdominal: Soft.  There is no tenderness. Psychiatric: Patient has a normal mood and affect. behavior is normal. Judgment and thought content normal. Muscular Skeletal: s/p left hip replacement with decrease rom of motion and still has pain, seeing Dr. Primus Bravo now  Recent Results (from the past 2160 hour(s))  POCT HgB A1C     Status: Abnormal   Collection Time: 08/18/17 10:36 AM  Result Value Ref Range   Hemoglobin A1C 6.5       PHQ2/9: Depression screen [facility] 2/9 05/11/2017 10/15/2016 10/01/2016 07/02/2016 06/02/2016  Decreased Interest 0 2 1 0 0  Down, Depressed, Hopeless 0 3 0 0 0  PHQ - 2 Score 0 5 1 0 0  Altered sleeping - 0 - - -  Tired, decreased energy - 2 - - -  Change in appetite - 0 - - -  Feeling bad or failure about yourself  - 0 - - -  Trouble concentrating - 0 - - -  Moving slowly or fidgety/restless - 0 - - -  Suicidal thoughts - 0 - - -  PHQ-9 Score - 7 - - -  Difficult doing work/chores - Somewhat difficult - - -     Fall Risk: Fall Risk  08/18/2017 05/11/2017 10/15/2016 10/01/2016 07/02/2016  Falls in the past year? No No No No No  Number falls in past yr: - - - - -  Injury with Fall? - - - - -  Risk for fall due to : - - - - -  Follow up - - - - -    Functional Status Survey: Is the patient deaf or have difficulty hearing?: No Does the patient have difficulty seeing, even when  wearing glasses/contacts?: No Does the patient have difficulty concentrating, remembering, or making decisions?: No Does the patient have difficulty walking or climbing stairs?:  Yes Does the patient have difficulty dressing or bathing?: No Does the patient have difficulty doing errands alone such as visiting a doctor's office or shopping?: Yes    Assessment & Plan  1. Dyslipidemia associated with type 2 diabetes mellitus (HCC)  - POCT HgB A1C - metFORMIN (GLUCOPHAGE-XR) 500 MG 24 hr tablet; Take 1 tablet (500 mg total) by mouth daily with breakfast.  Dispense: 90 tablet; Refill: 1 - glucose blood (ACCU-CHEK ACTIVE STRIPS) test strip; Use as instructed  Dispense: 100 each; Refill: 12 - Lancets (ACCU-CHEK SOFT TOUCH) lancets; Use as instructed  Dispense: 100 each; Refill: 12  2. B12 deficiency  - cyanocobalamin ((VITAMIN B-12)) injection 1,000 mcg  3. Depression, major, recurrent, mild (HCC)  - escitalopram (LEXAPRO) 20 MG tablet; Take 1 tablet (20 mg total) by mouth daily.  Dispense: 90 tablet; Refill: 1 - buPROPion (WELLBUTRIN XL) 150 MG 24 hr tablet; Take 3 tablets (450 mg total) by mouth daily.  Dispense: 270 tablet; Refill: 1  4. Hypertension, benign  - losartan-hydrochlorothiazide (HYZAAR) 100-25 MG tablet; Take 1 tablet by mouth daily.  Dispense: 90 tablet; Refill: 1 - amLODipine (NORVASC) 2.5 MG tablet; Take 1 tablet (2.5 mg total) by mouth daily.  Dispense: 90 tablet; Refill: 1 - metoprolol succinate (TOPROL-XL) 50 MG 24 hr tablet; Take 1 tablet (50 mg total) by mouth every evening. Take with or immediately following a meal.  Dispense: 90 tablet; Refill: 1  5. Mixed hyperlipidemia  - atorvastatin (LIPITOR) 40 MG tablet; Take 1 tablet (40 mg total) by mouth daily.  Dispense: 90 tablet; Refill: 1  6. CAD in native artery  - metoprolol succinate (TOPROL-XL) 50 MG 24 hr tablet; Take 1 tablet (50 mg total) by mouth every evening. Take with or immediately following a meal.   Dispense: 90 tablet; Refill: 1 - nitroGLYCERIN (NITROSTAT) 0.4 MG SL tablet; Place 1 tablet (0.4 mg total) under the tongue every 5 (five) minutes as needed. For chest pain.  Dispense: 30 tablet; Refill: 0  7. Gastroesophageal reflux disease without esophagitis  - omeprazole (PRILOSEC) 20 MG capsule; Take 1 capsule (20 mg total) by mouth daily.  Dispense: 90 capsule; Refill: 1  8. Obstructive sleep apnea  Dr. Ubaldo Glassing ordered a new study and he will go back next week to discuss results.

## 2017-08-19 DIAGNOSIS — G4733 Obstructive sleep apnea (adult) (pediatric): Secondary | ICD-10-CM | POA: Diagnosis not present

## 2017-08-19 DIAGNOSIS — R0602 Shortness of breath: Secondary | ICD-10-CM | POA: Diagnosis not present

## 2017-08-20 ENCOUNTER — Other Ambulatory Visit: Payer: Self-pay

## 2017-08-20 DIAGNOSIS — Z1211 Encounter for screening for malignant neoplasm of colon: Secondary | ICD-10-CM

## 2017-08-20 MED ORDER — PEG 3350-KCL-NA BICARB-NACL 420 G PO SOLR: 4000.0000 mL | Freq: Once | ORAL | 0 refills | Status: AC

## 2017-08-20 NOTE — Progress Notes (Signed)
Gastroenterology Pre-Procedure Review  Request Date: 1/24 Requesting Physician: Dr. Vicente Males  PATIENT REVIEW QUESTIONS: The patient responded to the following health history questions as indicated:    1. Are you having any GI issues? no 2. Do you have a personal history of Polyps? yes (2016) 3. Do you have a family history of Colon Cancer or Polyps? no 4. Diabetes Mellitus? yes (Type II; Metformin) 5. Joint replacements in the past 12 months?yes (Hip Replacement; Sept 4th, 2018) 6. Major health problems in the past 3 months?yes (Cardiac Catherization) 7. Any artificial heart valves, MVP, or defibrillator?no    MEDICATIONS & ALLERGIES:    Patient reports the following regarding taking any anticoagulation/antiplatelet therapy:   Plavix, Coumadin, Eliquis, Xarelto, Lovenox, Pradaxa, Brilinta, or Effient? no Aspirin? yes (81mg )  Patient confirms/reports the following medications:  Current Outpatient Medications  Medication Sig Dispense Refill  . amLODipine (NORVASC) 2.5 MG tablet Take 1 tablet (2.5 mg total) by mouth daily. 90 tablet 1  . aspirin EC 81 MG tablet Take 81 mg by mouth every morning.     [name] atorvastatin (LIPITOR) 40 MG tablet Take 1 tablet (40 mg total) by mouth daily. 90 tablet 1  . bisacodyl (DULCOLAX) 10 MG suppository Place 1 suppository (10 mg total) rectally daily as needed for moderate constipation. 12 suppository 0  . buPROPion (WELLBUTRIN XL) 150 MG 24 hr tablet Take 3 tablets (450 mg total) by mouth daily. 270 tablet 1  . docusate sodium (COLACE) 100 MG capsule Take 1 capsule (100 mg total) by mouth 2 (two) times daily. 10 capsule 0  . escitalopram (LEXAPRO) 20 MG tablet Take 1 tablet (20 mg total) by mouth daily. 90 tablet 1  . glucose blood (ACCU-CHEK ACTIVE STRIPS) test strip Use as instructed 100 each 12  . Lancets (ACCU-CHEK SOFT TOUCH) lancets Use as instructed 100 each 12  . losartan-hydrochlorothiazide (HYZAAR) 100-25 MG tablet Take 1 tablet by mouth daily. 90  tablet 1  . metFORMIN (GLUCOPHAGE-XR) 500 MG 24 hr tablet Take 1 tablet (500 mg total) by mouth daily with breakfast. 90 tablet 1  . metoprolol succinate (TOPROL-XL) 50 MG 24 hr tablet Take 1 tablet (50 mg total) by mouth every evening. Take with or immediately following a meal. 90 tablet 1  . nitroGLYCERIN (NITROSTAT) 0.4 MG SL tablet Place 1 tablet (0.4 mg total) under the tongue every 5 (five) minutes as needed. For chest pain. 30 tablet 0  . omeprazole (PRILOSEC) 20 MG capsule Take 1 capsule (20 mg total) by mouth daily. 90 capsule 1  . oxyCODONE (OXY IR/ROXICODONE) 5 MG immediate release tablet Take 1-2 tablets (5-10 mg total) by mouth every 4 (four) hours as needed for breakthrough pain. 30 tablet 0   No current facility-administered medications for this visit.     Patient confirms/reports the following allergies:  Allergies  Allergen Reactions  . Elavil [Amitriptyline Hcl] Rash  . Tape Rash    Paper Tape    No orders of the defined types were placed in this encounter.   AUTHORIZATION INFORMATION Primary Insurance: 1D#: Group #:  Secondary Insurance: 1D#: Group #:  SCHEDULE INFORMATION: Date: 1/24 Time: Location: [facility]

## 2017-08-23 DIAGNOSIS — E782 Mixed hyperlipidemia: Secondary | ICD-10-CM | POA: Diagnosis not present

## 2017-08-23 DIAGNOSIS — H8113 Benign paroxysmal vertigo, bilateral: Secondary | ICD-10-CM | POA: Diagnosis not present

## 2017-08-23 DIAGNOSIS — G4733 Obstructive sleep apnea (adult) (pediatric): Secondary | ICD-10-CM | POA: Diagnosis not present

## 2017-08-23 DIAGNOSIS — I2510 Atherosclerotic heart disease of native coronary artery without angina pectoris: Secondary | ICD-10-CM | POA: Diagnosis not present

## 2017-08-26 ENCOUNTER — Encounter: Admission: RE | Payer: Self-pay | Source: Ambulatory Visit

## 2017-08-26 ENCOUNTER — Ambulatory Visit: Admission: RE | Admit: 2017-08-26 | Payer: Medicare HMO | Source: Ambulatory Visit | Admitting: Gastroenterology

## 2017-08-26 SURGERY — COLONOSCOPY WITH PROPOFOL
Anesthesia: General

## 2017-08-30 ENCOUNTER — Telehealth: Payer: Self-pay

## 2017-08-30 NOTE — Telephone Encounter (Signed)
Left a message with family member for patient to callback.   Follow-up on colonoscopy cancellation.

## 2017-09-06 DIAGNOSIS — M545 Low back pain: Secondary | ICD-10-CM | POA: Diagnosis not present

## 2017-09-06 DIAGNOSIS — Z5181 Encounter for therapeutic drug level monitoring: Secondary | ICD-10-CM | POA: Diagnosis not present

## 2017-09-06 DIAGNOSIS — Z96642 Presence of left artificial hip joint: Secondary | ICD-10-CM | POA: Diagnosis not present

## 2017-09-06 DIAGNOSIS — E1140 Type 2 diabetes mellitus with diabetic neuropathy, unspecified: Secondary | ICD-10-CM | POA: Diagnosis not present

## 2017-09-19 DIAGNOSIS — R0602 Shortness of breath: Secondary | ICD-10-CM | POA: Diagnosis not present

## 2017-09-19 DIAGNOSIS — G4733 Obstructive sleep apnea (adult) (pediatric): Secondary | ICD-10-CM | POA: Diagnosis not present

## 2017-09-20 ENCOUNTER — Ambulatory Visit (INDEPENDENT_AMBULATORY_CARE_PROVIDER_SITE_OTHER): Payer: Medicare HMO | Admitting: Emergency Medicine

## 2017-09-20 DIAGNOSIS — E538 Deficiency of other specified B group vitamins: Secondary | ICD-10-CM

## 2017-09-20 MED ORDER — CYANOCOBALAMIN 1000 MCG/ML IJ SOLN
1000.0000 ug | Freq: Once | INTRAMUSCULAR | Status: AC
  Administered 2017-09-20: 1000 ug via INTRAMUSCULAR

## 2017-10-04 DIAGNOSIS — M545 Low back pain: Secondary | ICD-10-CM | POA: Diagnosis not present

## 2017-10-04 DIAGNOSIS — Z96642 Presence of left artificial hip joint: Secondary | ICD-10-CM | POA: Diagnosis not present

## 2017-10-04 DIAGNOSIS — Z5181 Encounter for therapeutic drug level monitoring: Secondary | ICD-10-CM | POA: Diagnosis not present

## 2017-10-04 DIAGNOSIS — M9904 Segmental and somatic dysfunction of sacral region: Secondary | ICD-10-CM | POA: Diagnosis not present

## 2017-10-11 ENCOUNTER — Other Ambulatory Visit: Payer: Self-pay | Admitting: Family Medicine

## 2017-10-11 DIAGNOSIS — E785 Hyperlipidemia, unspecified: Principal | ICD-10-CM

## 2017-10-11 DIAGNOSIS — E1169 Type 2 diabetes mellitus with other specified complication: Secondary | ICD-10-CM

## 2017-10-11 NOTE — Telephone Encounter (Signed)
Refill request for diabetic medication:   Metformin 500 mg   Last office visit pertaining to diabetes: 08/18/2017  Lab Results  Component Value Date   HGBA1C 6.5 08/18/2017    Follow-up on file. 12/16/17

## 2017-10-17 DIAGNOSIS — R0602 Shortness of breath: Secondary | ICD-10-CM | POA: Diagnosis not present

## 2017-10-17 DIAGNOSIS — G4733 Obstructive sleep apnea (adult) (pediatric): Secondary | ICD-10-CM | POA: Diagnosis not present

## 2017-10-21 ENCOUNTER — Ambulatory Visit
Admission: RE | Admit: 2017-10-21 | Discharge: 2017-10-21 | Disposition: A | Discharge status: Home / Self Care | Payer: Medicare HMO | Source: Ambulatory Visit | Attending: Family Medicine | Admitting: Family Medicine

## 2017-10-21 ENCOUNTER — Ambulatory Visit (INDEPENDENT_AMBULATORY_CARE_PROVIDER_SITE_OTHER): Payer: Medicare HMO | Admitting: Family Medicine

## 2017-10-21 ENCOUNTER — Encounter: Payer: Self-pay | Admitting: Family Medicine

## 2017-10-21 VITALS — BP 124/68 | HR 86 | Temp 98.5°F | Resp 18 | Ht 67.0 in | Wt 212.6 lb

## 2017-10-21 DIAGNOSIS — R0602 Shortness of breath: Secondary | ICD-10-CM

## 2017-10-21 DIAGNOSIS — R05 Cough: Secondary | ICD-10-CM

## 2017-10-21 DIAGNOSIS — J069 Acute upper respiratory infection, unspecified: Secondary | ICD-10-CM

## 2017-10-21 DIAGNOSIS — R059 Cough, unspecified: Secondary | ICD-10-CM

## 2017-10-21 MED ORDER — ALBUTEROL SULFATE HFA 108 (90 BASE) MCG/ACT IN AERS: 2.0000 | INHALATION_SPRAY | Freq: Four times a day (QID) | RESPIRATORY_TRACT | 0 refills | Status: DC | PRN

## 2017-10-21 MED ORDER — GUAIFENESIN ER 600 MG PO TB12: 600.0000 mg | ORAL_TABLET | Freq: Two times a day (BID) | ORAL | 0 refills | Status: DC | PRN

## 2017-10-21 MED ORDER — BENZONATATE 100 MG PO CAPS: 100.0000 mg | ORAL_CAPSULE | Freq: Two times a day (BID) | ORAL | 0 refills | Status: DC | PRN

## 2017-10-21 MED ORDER — FLUTICASONE PROPIONATE 50 MCG/ACT NA SUSP: 2.0000 | Freq: Every day | NASAL | 0 refills | Status: DC

## 2017-10-21 NOTE — Progress Notes (Signed)
Name: Jimmy Green   MRN: [id number]    DOB: 1950-08-26   Date:10/21/2017       Progress Note  Subjective  Chief Complaint  Chief Complaint  Patient presents with  . URI    head and ears feels stopped up, cough (yellow thick phelgm) for 7 days)    HPI  Pt presents with 7 days of URI symptoms - started with scratchy throat and nasal congestion, has progressed to productive (yellow/thick), nasal congestion, shortness of breath, some chest pain with shortness of breath, and some lightheadedness.  Endorses significant fatigue, subjective fevers and chills along with decreased appetite.  Denies NVD or abdominal pain.  He has a history of pneumonia and is fearful of developing this again.  Pt is diabetic - taking metformin, Last A!C was 6.5% in January 2018.  Discussed how DM may increase risk of complex infections. Only checks BG's sometimes - highest has been 150, lowest 112.  Patient Active Problem List   Diagnosis Date Noted  . Primary localized osteoarthritis of left hip 04/06/2017  . Dyslipidemia associated with type 2 diabetes mellitus ([place]) 10/04/2016  . Trigger thumb of both thumbs 06/02/2016  . Chronic tension-type headache, intractable 09/25/2015  . Benign paroxysmal positional vertigo due to bilateral vestibular disorder 09/25/2015  . Hearing loss sensory, bilateral 08/26/2015  . Cerebral microvascular disease 08/26/2015  . Metabolic syndrome 29/93/7169  . Vertigo 06/21/2015  . Buzzing in ear 06/21/2015  . Hypertension goal BP (blood pressure) < 140/90 03/28/2015  . GERD (gastroesophageal reflux disease) 03/28/2015  . Hyperlipidemia 03/28/2015  . Depression, major 03/28/2015  . CAD in native artery 03/28/2015  . Actinic keratosis 02/14/2015  . Obstructive sleep apnea 02/03/2015  . Diverticulosis of colon without hemorrhage 01/31/2015  . Hx of colonic polyps   . Benign neoplasm of descending colon   . Benign neoplasm of sigmoid colon   . Idiopathic colitis   .  Sebaceous cyst 09/14/2014    Social History   Tobacco Use  . Smoking status: Former Smoker    Years: 40.00    Types: Cigarettes    Last attempt to quit: 08/03/2012    Years since quitting: 5.2  . Smokeless tobacco: Never Used  Substance Use Topics  . Alcohol use: No    Alcohol/week: 0.0 oz     Current Outpatient Medications:  .  amLODipine (NORVASC) 2.5 MG tablet, Take 1 tablet (2.5 mg total) by mouth daily., Disp: 90 tablet, Rfl: 1 .  aspirin EC 81 MG tablet, Take 81 mg by mouth every morning. , Disp: , Rfl:  .  atorvastatin (LIPITOR) 40 MG tablet, Take 1 tablet (40 mg total) by mouth daily., Disp: 90 tablet, Rfl: 1 .  bisacodyl (DULCOLAX) 10 MG suppository, Place 1 suppository (10 mg total) rectally daily as needed for moderate constipation., Disp: 12 suppository, Rfl: 0 .  buPROPion (WELLBUTRIN XL) 150 MG 24 hr tablet, Take 3 tablets (450 mg total) by mouth daily., Disp: 270 tablet, Rfl: 1 .  docusate sodium (COLACE) 100 MG capsule, Take 1 capsule (100 mg total) by mouth 2 (two) times daily., Disp: 10 capsule, Rfl: 0 .  escitalopram (LEXAPRO) 20 MG tablet, Take 1 tablet (20 mg total) by mouth daily., Disp: 90 tablet, Rfl: 1 .  glucose blood (ACCU-CHEK ACTIVE STRIPS) test strip, Use as instructed, Disp: 100 each, Rfl: 12 .  Lancets (ACCU-CHEK SOFT TOUCH) lancets, Use as instructed, Disp: 100 each, Rfl: 12 .  losartan-hydrochlorothiazide (HYZAAR) 100-25 MG tablet, Take 1  tablet by mouth daily., Disp: 90 tablet, Rfl: 1 .  metFORMIN (GLUCOPHAGE-XR) 500 MG 24 hr tablet, Take 1 tablet (500 mg total) by mouth daily with breakfast., Disp: 90 tablet, Rfl: 1 .  metoprolol succinate (TOPROL-XL) 50 MG 24 hr tablet, Take 1 tablet (50 mg total) by mouth every evening. Take with or immediately following a meal., Disp: 90 tablet, Rfl: 1 .  nitroGLYCERIN (NITROSTAT) 0.4 MG SL tablet, Place 1 tablet (0.4 mg total) under the tongue every 5 (five) minutes as needed. For chest pain., Disp: 30 tablet, Rfl:  0 .  omeprazole (PRILOSEC) 20 MG capsule, Take 1 capsule (20 mg total) by mouth daily., Disp: 90 capsule, Rfl: 1 .  oxyCODONE (OXY IR/ROXICODONE) 5 MG immediate release tablet, Take 1-2 tablets (5-10 mg total) by mouth every 4 (four) hours as needed for breakthrough pain., Disp: 30 tablet, Rfl: 0  Allergies  Allergen Reactions  . Elavil [Amitriptyline Hcl] Rash  . Tape Rash    Paper Tape    ROS  Ten systems reviewed and is negative except as mentioned in HPI  Objective  Vitals:   10/21/17 1430  BP: 124/68  Pulse: 86  Resp: 18  Temp: 98.5 F (36.9 C)  TempSrc: Oral  SpO2: 97%  Weight: 212 lb 9.6 oz (96.4 kg)  Height: 5\' 7"  (1.702 m)   Body mass index is 33.3 kg/m.  Nursing Note and Vital Signs reviewed.  Physical Exam  Constitutional: Patient appears well-developed and well-nourished. Obese. No distress.  HEENT: head atraumatic, normocephalic, pupils equal and reactive to light, EOM's intact, TM's without erythema or bulging, no maxillary or frontal sinus tenderness, neck supple without lymphadenopathy, oropharynx erythematous and moist without exudate Cardiovascular: Normal rate, regular rhythm, S1/S2 present.  No murmur or rub heard. No BLE edema. Pulmonary/Chest: Effort normal and breath sounds clear. No respiratory distress or retractions. Congested cough present throughout examination, worse with deep inspiration. Psychiatric: Patient has a normal mood and affect. behavior is normal. Judgment and thought content normal.  No results found for this or any previous visit (from the past 72 hour(s)).  Assessment & Plan  1. Upper respiratory tract infection, unspecified type - albuterol (PROVENTIL HFA;VENTOLIN HFA) 108 (90 Base) MCG/ACT inhaler; Inhale 2 puffs into the lungs every 6 (six) hours as needed for wheezing or shortness of breath.  Dispense: 1 Inhaler; Refill: 0 - fluticasone (FLONASE) 50 MCG/ACT nasal spray; Place 2 sprays into both nostrils daily.  Dispense:  16 g; Refill: 0 - guaiFENesin (MUCINEX) 600 MG 12 hr tablet; Take 1 tablet (600 mg total) by mouth 2 (two) times daily as needed for cough or to loosen phlegm.  Dispense: 30 tablet; Refill: 0  2. Shortness of breath - albuterol (PROVENTIL HFA;VENTOLIN HFA) 108 (90 Base) MCG/ACT inhaler; Inhale 2 puffs into the lungs every 6 (six) hours as needed for wheezing or shortness of breath.  Dispense: 1 Inhaler; Refill: 0 - DG Chest 2 View; Future  3. Cough - albuterol (PROVENTIL HFA;VENTOLIN HFA) 108 (90 Base) MCG/ACT inhaler; Inhale 2 puffs into the lungs every 6 (six) hours as needed for wheezing or shortness of breath.  Dispense: 1 Inhaler; Refill: 0 - guaiFENesin (MUCINEX) 600 MG 12 hr tablet; Take 1 tablet (600 mg total) by mouth 2 (two) times daily as needed for cough or to loosen phlegm.  Dispense: 30 tablet; Refill: 0 - benzonatate (TESSALON) 100 MG capsule; Take 1 capsule (100 mg total) by mouth 2 (two) times daily as needed for cough.  Dispense: 20 capsule; Refill: 0 - DG Chest 2 View; Future  -Red flags and when to present for emergency care or RTC including fever >101.23F, chest pain, shortness of breath, new/worsening/un-resolving symptoms, reviewed with patient at time of visit. Follow up and care instructions discussed and provided in AVS.

## 2017-11-01 DIAGNOSIS — Z96642 Presence of left artificial hip joint: Secondary | ICD-10-CM | POA: Diagnosis not present

## 2017-11-01 DIAGNOSIS — M545 Low back pain: Secondary | ICD-10-CM | POA: Diagnosis not present

## 2017-11-01 DIAGNOSIS — M9904 Segmental and somatic dysfunction of sacral region: Secondary | ICD-10-CM | POA: Diagnosis not present

## 2017-11-01 DIAGNOSIS — Z5181 Encounter for therapeutic drug level monitoring: Secondary | ICD-10-CM | POA: Diagnosis not present

## 2017-11-09 ENCOUNTER — Ambulatory Visit (INDEPENDENT_AMBULATORY_CARE_PROVIDER_SITE_OTHER): Payer: Medicare Other

## 2017-11-09 VITALS — BP 124/68 | Ht 67.0 in | Wt 212.5 lb

## 2017-11-09 DIAGNOSIS — E538 Deficiency of other specified B group vitamins: Secondary | ICD-10-CM

## 2017-11-09 MED ORDER — CYANOCOBALAMIN 1000 MCG/ML IJ SOLN
1000.0000 ug | INTRAMUSCULAR | Status: DC
  Administered 2017-11-09 – 2018-04-25 (×6): 1000 ug via INTRAMUSCULAR

## 2017-11-09 NOTE — Progress Notes (Signed)
Patient came in for her B-12 injection. He tolerated it well. NKDA.   

## 2017-11-12 ENCOUNTER — Other Ambulatory Visit: Payer: Self-pay | Admitting: Family Medicine

## 2017-11-12 DIAGNOSIS — I2510 Atherosclerotic heart disease of native coronary artery without angina pectoris: Secondary | ICD-10-CM

## 2017-11-12 NOTE — Telephone Encounter (Signed)
Refill request for Hypertension medication:  Nitrostat 0.4 mg  Last office visit pertaining to hypertension: 10/21/2017  BP Readings from Last 3 Encounters:  11/09/17 124/68  10/21/17 124/68  08/18/17 120/80    Lab Results  Component Value Date   CREATININE 0.79 04/09/2017   BUN 22 (H) 04/09/2017   NA 135 04/09/2017   K 3.3 (L) 04/09/2017   CL 100 (L) 04/09/2017   CO2 29 04/09/2017    Follow-ups on file. 11/25/2017

## 2017-11-17 DIAGNOSIS — G4733 Obstructive sleep apnea (adult) (pediatric): Secondary | ICD-10-CM | POA: Diagnosis not present

## 2017-11-25 ENCOUNTER — Encounter: Payer: Self-pay | Admitting: Family Medicine

## 2017-11-25 ENCOUNTER — Ambulatory Visit (INDEPENDENT_AMBULATORY_CARE_PROVIDER_SITE_OTHER): Payer: Medicare Other | Admitting: Family Medicine

## 2017-11-25 VITALS — BP 130/78 | HR 72 | Temp 98.1°F | Resp 16 | Ht 67.0 in | Wt 217.9 lb

## 2017-11-25 DIAGNOSIS — M5416 Radiculopathy, lumbar region: Secondary | ICD-10-CM

## 2017-11-25 DIAGNOSIS — M47816 Spondylosis without myelopathy or radiculopathy, lumbar region: Secondary | ICD-10-CM | POA: Diagnosis not present

## 2017-11-25 NOTE — Progress Notes (Signed)
Name: Jimmy Green   MRN: [id number]    DOB: 04-Mar-1951   Date:11/25/2017       Progress Note  Subjective  Chief Complaint  Chief Complaint  Patient presents with  . Back Pain    Going to Dr. Primus Bravo and is requiring a MRI for his back pain. Would like to discuss this with Dr. Ancil Boozer first.    HPI  Back pain: he is seeing Dr. Primus Bravo and is taking pain medication, he states Dr. Primus Bravo has been concerned about origin of his symptoms. He had left hip replacement Fall 2018 and is still having numbness on left lateral thigh, also cannot raise her left leg to put her socks on, he has also noticed left lower back pain, previous history of facet arthritis of lumbar spine. No bowel or bladder incontinence. We will check  MRI as requested, hold off on oral steroids because of history of DM and can have procedure done by Dr. Primus Bravo ( less systemic effect)   Patient Active Problem List   Diagnosis Date Noted  . Primary localized osteoarthritis of left hip 04/06/2017  . Dyslipidemia associated with type 2 diabetes mellitus ([place]) 10/04/2016  . Trigger thumb of both thumbs 06/02/2016  . Chronic tension-type headache, intractable 09/25/2015  . Benign paroxysmal positional vertigo due to bilateral vestibular disorder 09/25/2015  . Hearing loss sensory, bilateral 08/26/2015  . Cerebral microvascular disease 08/26/2015  . Metabolic syndrome 79/48/0165  . Vertigo 06/21/2015  . Buzzing in ear 06/21/2015  . Hypertension goal BP (blood pressure) < 140/90 03/28/2015  . GERD (gastroesophageal reflux disease) 03/28/2015  . Hyperlipidemia 03/28/2015  . Depression, major 03/28/2015  . CAD in native artery 03/28/2015  . Actinic keratosis 02/14/2015  . Obstructive sleep apnea 02/03/2015  . Diverticulosis of colon without hemorrhage 01/31/2015  . Hx of colonic polyps   . Benign neoplasm of descending colon   . Benign neoplasm of sigmoid colon   . Idiopathic colitis   . Sebaceous cyst 09/14/2014    Past  Surgical History:  Procedure Laterality Date  . BACK SURGERY    . BASAL CELL CARCINOMA EXCISION Left 12/02/2015   Chest-Done by Dermatologist   . CARDIAC CATHETERIZATION Left 04/01/2016   Procedure: Left Heart Cath and Coronary Angiography;  Surgeon: Teodoro Spray, MD;  Location: [place] [facility];  Service: Cardiovascular;  Laterality: Left;  . CARDIAC CATHETERIZATION N/A 04/01/2016   Procedure: Intravascular Pressure Wire/FFR Study;  Surgeon: Isaias Cowman, MD;  Location: [place] [facility];  Service: Cardiovascular;  Laterality: N/A;  . COLONOSCOPY N/A 01/15/2015   Wohl-ileitis, 2 benign polyps, cryptitis, sigmoid diverticulosis, focal ulceration ICV  . CORONARY STENT PLACEMENT    . FRACTIONAL FLOW RESERVE WIRE  10/08/2011   Procedure: FRACTIONAL FLOW RESERVE WIRE;  Surgeon: Clent Demark, MD;  Location: [facility] CATH LAB;  Service: Cardiovascular;;  . HERNIA REPAIR    . KNEE SURGERY    . LEFT HEART CATHETERIZATION WITH CORONARY ANGIOGRAM N/A 10/08/2011   Procedure: LEFT HEART CATHETERIZATION WITH CORONARY ANGIOGRAM;  Surgeon: Clent Demark, MD;  Location: [place] CATH LAB;  Service: Cardiovascular;  Laterality: N/A;  . TOTAL HIP ARTHROPLASTY Left 04/06/2017   Procedure: TOTAL HIP ARTHROPLASTY ANTERIOR APPROACH;  Surgeon: Hessie Knows, MD;  Location: [facility] ORS;  Service: Orthopedics;  Laterality: Left;    Family History  Problem Relation Age of Onset  . Lung cancer Father   . Heart disease Father   . Skin cancer Father     Social  History   Socioeconomic History  . Marital status: Married    Spouse name: Not on file  . Number of children: Not on file  . Years of education: Not on file  . Highest education level: Not on file  Occupational History  . Not on file  Social Needs  . Financial resource strain: Not on file  . Food insecurity:    Worry: Not on file    Inability: Not on file  . Transportation needs:    Medical: Not on file    Non-medical: Not on file  Tobacco Use   . Smoking status: Former Smoker    Years: 40.00    Types: Cigarettes    Last attempt to quit: 08/03/2012    Years since quitting: 5.3  . Smokeless tobacco: Never Used  Substance and Sexual Activity  . Alcohol use: No    Alcohol/week: 0.0 oz  . Drug use: No  . Sexual activity: Yes    Partners: Female  Lifestyle  . Physical activity:    Days per week: Not on file    Minutes per session: Not on file  . Stress: Not on file  Relationships  . Social connections:    Talks on phone: Not on file    Gets together: Not on file    Attends religious service: Not on file    Active member of club or organization: Not on file    Attends meetings of clubs or organizations: Not on file    Relationship status: Not on file  . Intimate partner violence:    Fear of current or ex partner: Not on file    Emotionally abused: Not on file    Physically abused: Not on file    Forced sexual activity: Not on file  Other Topics Concern  . Not on file  Social History Narrative  . Not on file     Current Outpatient Medications:  .  albuterol (PROVENTIL HFA;VENTOLIN HFA) 108 (90 Base) MCG/ACT inhaler, Inhale 2 puffs into the lungs every 6 (six) hours as needed for wheezing or shortness of breath., Disp: 1 Inhaler, Rfl: 0 .  amLODipine (NORVASC) 2.5 MG tablet, Take 1 tablet (2.5 mg total) by mouth daily., Disp: 90 tablet, Rfl: 1 .  aspirin EC 81 MG tablet, Take 81 mg by mouth every morning. , Disp: , Rfl:  .  atorvastatin (LIPITOR) 40 MG tablet, Take 1 tablet (40 mg total) by mouth daily., Disp: 90 tablet, Rfl: 1 .  bisacodyl (DULCOLAX) 10 MG suppository, Place 1 suppository (10 mg total) rectally daily as needed for moderate constipation., Disp: 12 suppository, Rfl: 0 .  buPROPion (WELLBUTRIN XL) 150 MG 24 hr tablet, Take 3 tablets (450 mg total) by mouth daily., Disp: 270 tablet, Rfl: 1 .  docusate sodium (COLACE) 100 MG capsule, Take 1 capsule (100 mg total) by mouth 2 (two) times daily., Disp: 10  capsule, Rfl: 0 .  escitalopram (LEXAPRO) 20 MG tablet, Take 1 tablet (20 mg total) by mouth daily., Disp: 90 tablet, Rfl: 1 .  fluticasone (FLONASE) 50 MCG/ACT nasal spray, Place 2 sprays into both nostrils daily., Disp: 16 g, Rfl: 0 .  glucose blood (ACCU-CHEK ACTIVE STRIPS) test strip, Use as instructed, Disp: 100 each, Rfl: 12 .  Lancets (ACCU-CHEK SOFT TOUCH) lancets, Use as instructed, Disp: 100 each, Rfl: 12 .  losartan-hydrochlorothiazide (HYZAAR) 100-25 MG tablet, Take 1 tablet by mouth daily., Disp: 90 tablet, Rfl: 1 .  metFORMIN (GLUCOPHAGE-XR) 500 MG 24 hr  tablet, Take 1 tablet (500 mg total) by mouth daily with breakfast., Disp: 90 tablet, Rfl: 1 .  metoprolol succinate (TOPROL-XL) 50 MG 24 hr tablet, Take 1 tablet (50 mg total) by mouth every evening. Take with or immediately following a meal., Disp: 90 tablet, Rfl: 1 .  NARCAN 4 MG/0.1ML LIQD nasal spray kit, , Disp: , Rfl: 0 .  nitroGLYCERIN (NITROSTAT) 0.4 MG SL tablet, Place 1 tablet (0.4 mg total) under the tongue every 5 (five) minutes as needed. For chest pain., Disp: 30 tablet, Rfl: 0 .  omeprazole (PRILOSEC) 20 MG capsule, Take 1 capsule (20 mg total) by mouth daily., Disp: 90 capsule, Rfl: 1 .  oxyCODONE (OXY IR/ROXICODONE) 5 MG immediate release tablet, Take 1-2 tablets (5-10 mg total) by mouth every 4 (four) hours as needed for breakthrough pain., Disp: 30 tablet, Rfl: 0  Current Facility-Administered Medications:  .  cyanocobalamin ((VITAMIN B-12)) injection 1,000 mcg, 1,000 mcg, Intramuscular, Q30 days, Steele Sizer, MD, 1,000 mcg at 11/09/17 0848  Allergies  Allergen Reactions  . Elavil [Amitriptyline Hcl] Rash  . Tape Rash    Paper Tape     ROS  Ten systems reviewed and is negative except as mentioned in HPI  Objective  Vitals:   11/25/17 1117  BP: 130/78  Pulse: 72  Resp: 16  Temp: 98.1 F (36.7 C)  TempSrc: Oral  SpO2: 97%  Weight: 217 lb 14.4 oz (98.8 kg)  Height: '5\' 7"'  (1.702 m)    Body  mass index is 34.13 kg/m.  Physical Exam  Constitutional: Patient appears well-developed and well-nourished. Obese No distress.  HEENT: head atraumatic, normocephalic, pupils equal and reactive to light, neck supple, throat within normal limits Cardiovascular: Normal rate, regular rhythm and normal heart sounds.  No murmur heard. No BLE edema. Pulmonary/Chest: Effort normal and breath sounds normal. No respiratory distress. Abdominal: Soft.  There is no tenderness. Psychiatric: Patient has a normal mood and affect. behavior is normal. Judgment and thought content normal. Muscular skeletal: weaker on left lower leg, paresthesia left lateral thigh, pain during palpation of left lower back , negative straight leg raise, hip rom decreased on left side.  Pain with left lateral bending spine  PHQ2/9: Depression screen [facility] 2/9 11/25/2017 05/11/2017 10/15/2016 10/01/2016 07/02/2016  Decreased Interest 0 0 2 1 0  Down, Depressed, Hopeless 0 0 3 0 0  PHQ - 2 Score 0 0 5 1 0  Altered sleeping - - 0 - -  Tired, decreased energy - - 2 - -  Change in appetite - - 0 - -  Feeling bad or failure about yourself  - - 0 - -  Trouble concentrating - - 0 - -  Moving slowly or fidgety/restless - - 0 - -  Suicidal thoughts - - 0 - -  PHQ-9 Score - - 7 - -  Difficult doing work/chores - - Somewhat difficult - -    Fall Risk: Fall Risk  11/25/2017 08/18/2017 05/11/2017 10/15/2016 10/01/2016  Falls in the past year? No No No No No  Number falls in past yr: - - - - -  Injury with Fall? - - - - -  Risk for fall due to : - - - - -  Follow up - - - - -     Functional Status Survey: Is the patient deaf or have difficulty hearing?: No Does the patient have difficulty seeing, even when wearing glasses/contacts?: No Does the patient have difficulty concentrating, remembering, or making decisions?:  No Does the patient have difficulty walking or climbing stairs?: Yes Does the patient have difficulty dressing or  bathing?: No Does the patient have difficulty doing errands alone such as visiting a doctor's office or shopping?: No    Assessment & Plan  1. Lumbar back pain with radiculopathy affecting left lower extremity  - MR Lumbar Spine Wo Contrast; Future  2. Facet arthritis of lumbar region  Seeing Dr. Primus Bravo, and per patient he needs to differentiate between hip or back causing his symptoms of left leg weakness and paresthesia left outer thigh

## 2017-11-29 DIAGNOSIS — M545 Low back pain: Secondary | ICD-10-CM | POA: Diagnosis not present

## 2017-11-29 DIAGNOSIS — Z5181 Encounter for therapeutic drug level monitoring: Secondary | ICD-10-CM | POA: Diagnosis not present

## 2017-11-29 DIAGNOSIS — M9904 Segmental and somatic dysfunction of sacral region: Secondary | ICD-10-CM | POA: Diagnosis not present

## 2017-11-29 DIAGNOSIS — Z96642 Presence of left artificial hip joint: Secondary | ICD-10-CM | POA: Diagnosis not present

## 2017-12-11 ENCOUNTER — Ambulatory Visit
Admission: RE | Admit: 2017-12-11 | Discharge: 2017-12-11 | Disposition: A | Discharge status: Home / Self Care | Payer: Medicare Other | Source: Ambulatory Visit | Attending: Family Medicine | Admitting: Family Medicine

## 2017-12-11 DIAGNOSIS — M4726 Other spondylosis with radiculopathy, lumbar region: Secondary | ICD-10-CM | POA: Diagnosis not present

## 2017-12-11 DIAGNOSIS — M5416 Radiculopathy, lumbar region: Secondary | ICD-10-CM | POA: Diagnosis present

## 2017-12-11 DIAGNOSIS — M545 Low back pain: Secondary | ICD-10-CM | POA: Diagnosis not present

## 2017-12-16 ENCOUNTER — Encounter: Payer: Self-pay | Admitting: Family Medicine

## 2017-12-16 ENCOUNTER — Ambulatory Visit (INDEPENDENT_AMBULATORY_CARE_PROVIDER_SITE_OTHER): Payer: Medicare Other | Admitting: Family Medicine

## 2017-12-16 VITALS — BP 134/72 | HR 77 | Temp 97.7°F | Resp 16 | Ht 67.0 in | Wt 220.4 lb

## 2017-12-16 DIAGNOSIS — G4733 Obstructive sleep apnea (adult) (pediatric): Secondary | ICD-10-CM | POA: Diagnosis not present

## 2017-12-16 DIAGNOSIS — F330 Major depressive disorder, recurrent, mild: Secondary | ICD-10-CM | POA: Diagnosis not present

## 2017-12-16 DIAGNOSIS — I10 Essential (primary) hypertension: Secondary | ICD-10-CM

## 2017-12-16 DIAGNOSIS — Z23 Encounter for immunization: Secondary | ICD-10-CM

## 2017-12-16 DIAGNOSIS — M5416 Radiculopathy, lumbar region: Secondary | ICD-10-CM | POA: Diagnosis not present

## 2017-12-16 DIAGNOSIS — M47816 Spondylosis without myelopathy or radiculopathy, lumbar region: Secondary | ICD-10-CM

## 2017-12-16 DIAGNOSIS — I2510 Atherosclerotic heart disease of native coronary artery without angina pectoris: Secondary | ICD-10-CM | POA: Diagnosis not present

## 2017-12-16 DIAGNOSIS — E1169 Type 2 diabetes mellitus with other specified complication: Secondary | ICD-10-CM | POA: Diagnosis not present

## 2017-12-16 DIAGNOSIS — E538 Deficiency of other specified B group vitamins: Secondary | ICD-10-CM

## 2017-12-16 DIAGNOSIS — I200 Unstable angina: Secondary | ICD-10-CM

## 2017-12-16 DIAGNOSIS — E785 Hyperlipidemia, unspecified: Secondary | ICD-10-CM | POA: Diagnosis not present

## 2017-12-16 LAB — POCT GLYCOSYLATED HEMOGLOBIN (HGB A1C): Hemoglobin A1C: 6.2

## 2017-12-16 NOTE — Progress Notes (Signed)
Name: Jimmy Green   MRN: [id number]    DOB: 1950-08-23   Date:12/16/2017       Progress Note  Subjective  Chief Complaint  Chief Complaint  Patient presents with  . Medication Refill  . Diabetes    Checks sugar periodically  . Hypertension    Headache every other day  . Dyslipidemia  . Sleep Apnea  . Depression    HPI  Depression Major: taking medication daily, no suicidal thoughts or ideation, he states he feels better most days.   Dyslipidemia with type II DM: he denies polyphagia, polydipsia or polyuria. His hgbA1C was 6.7% diagnosed March 2018, hgbA1C down to 6.2  Explained importance of life style modification and diabetic class ( he refuses ). He has been able to walk some, but not consistently. , aspirin daily, ARB and statin therapy. Eye exam is up to date. Taking metformin as recommended, denies side effects  HTN: bp is at goal , she has been taking medication daily. BP has been controlled still around 130/70's. He denies chest pain, palpitation. Feeling well at this time  Hyperlipidemia: taking Atorvastatin and denies side effects. No myalgia.    OSA: he got a CPAP  mask was not fitting properly and causing right forehead pain , ordered by Dr. Ubaldo Glassing, discussed importance of trying something else but he is not interested.    History of basal cell carcinoma: Seeing Dermatologist at [facility].   CAD: taking aspirin, statin and beta blocker and ARB,and he has NTG in his pocket. Nochest pain or decrease in exercise tolerance. He was seen by Dr. Ubaldo Glassing in 08/2017, and had another heart cath that showed no significant disease, 55-70% stenosis on the LAD , on medical management, metoprolol, losartan, aspirin. No pain at this time  Left hip replacement history : still having pain, seeing Dr Primus Bravo now to control pain    Patient Active Problem List   Diagnosis Date Noted  . Primary localized osteoarthritis of left hip 04/06/2017  . Dyslipidemia associated  with type 2 diabetes mellitus ([place]) 10/04/2016  . Trigger thumb of both thumbs 06/02/2016  . Chronic tension-type headache, intractable 09/25/2015  . Benign paroxysmal positional vertigo due to bilateral vestibular disorder 09/25/2015  . Hearing loss sensory, bilateral 08/26/2015  . Cerebral microvascular disease 08/26/2015  . Metabolic syndrome 69/79/4801  . Vertigo 06/21/2015  . Buzzing in ear 06/21/2015  . Hypertension goal BP (blood pressure) < 140/90 03/28/2015  . GERD (gastroesophageal reflux disease) 03/28/2015  . Hyperlipidemia 03/28/2015  . Depression, major 03/28/2015  . CAD in native artery 03/28/2015  . Actinic keratosis 02/14/2015  . Obstructive sleep apnea 02/03/2015  . Diverticulosis of colon without hemorrhage 01/31/2015  . Hx of colonic polyps   . Benign neoplasm of descending colon   . Benign neoplasm of sigmoid colon   . Idiopathic colitis   . Sebaceous cyst 09/14/2014    Past Surgical History:  Procedure Laterality Date  . BACK SURGERY    . BASAL CELL CARCINOMA EXCISION Left 12/02/2015   Chest-Done by Dermatologist   . CARDIAC CATHETERIZATION Left 04/01/2016   Procedure: Left Heart Cath and Coronary Angiography;  Surgeon: Teodoro Spray, MD;  Location: [place] [facility];  Service: Cardiovascular;  Laterality: Left;  . CARDIAC CATHETERIZATION N/A 04/01/2016   Procedure: Intravascular Pressure Wire/FFR Study;  Surgeon: Isaias Cowman, MD;  Location: [place] [facility];  Service: Cardiovascular;  Laterality: N/A;  . COLONOSCOPY N/A 01/15/2015   Wohl-ileitis, 2 benign polyps, cryptitis, sigmoid  diverticulosis, focal ulceration ICV  . CORONARY STENT PLACEMENT    . FRACTIONAL FLOW RESERVE WIRE  10/08/2011   Procedure: FRACTIONAL FLOW RESERVE WIRE;  Surgeon: Clent Demark, MD;  Location: [facility] CATH LAB;  Service: Cardiovascular;;  . HERNIA REPAIR    . KNEE SURGERY    . LEFT HEART CATHETERIZATION WITH CORONARY ANGIOGRAM N/A 10/08/2011   Procedure: LEFT HEART  CATHETERIZATION WITH CORONARY ANGIOGRAM;  Surgeon: Clent Demark, MD;  Location: [place] CATH LAB;  Service: Cardiovascular;  Laterality: N/A;  . TOTAL HIP ARTHROPLASTY Left 04/06/2017   Procedure: TOTAL HIP ARTHROPLASTY ANTERIOR APPROACH;  Surgeon: Hessie Knows, MD;  Location: [facility] ORS;  Service: Orthopedics;  Laterality: Left;    Family History  Problem Relation Age of Onset  . Lung cancer Father   . Heart disease Father   . Skin cancer Father     Social History   Socioeconomic History  . Marital status: Married    Spouse name: Not on file  . Number of children: Not on file  . Years of education: Not on file  . Highest education level: Not on file  Occupational History  . Not on file  Social Needs  . Financial resource strain: Not on file  . Food insecurity:    Worry: Not on file    Inability: Not on file  . Transportation needs:    Medical: Not on file    Non-medical: Not on file  Tobacco Use  . Smoking status: Former Smoker    Years: 40.00    Types: Cigarettes    Last attempt to quit: 08/03/2012    Years since quitting: 5.3  . Smokeless tobacco: Never Used  Substance and Sexual Activity  . Alcohol use: No    Alcohol/week: 0.0 oz  . Drug use: No  . Sexual activity: Yes    Partners: Female  Lifestyle  . Physical activity:    Days per week: Not on file    Minutes per session: Not on file  . Stress: Not on file  Relationships  . Social connections:    Talks on phone: Not on file    Gets together: Not on file    Attends religious service: Not on file    Active member of club or organization: Not on file    Attends meetings of clubs or organizations: Not on file    Relationship status: Not on file  . Intimate partner violence:    Fear of current or ex partner: Not on file    Emotionally abused: Not on file    Physically abused: Not on file    Forced sexual activity: Not on file  Other Topics Concern  . Not on file  Social History Narrative  . Not on file      Current Outpatient Medications:  .  albuterol (PROVENTIL HFA;VENTOLIN HFA) 108 (90 Base) MCG/ACT inhaler, Inhale 2 puffs into the lungs every 6 (six) hours as needed for wheezing or shortness of breath., Disp: 1 Inhaler, Rfl: 0 .  amLODipine (NORVASC) 2.5 MG tablet, Take 1 tablet (2.5 mg total) by mouth daily., Disp: 90 tablet, Rfl: 1 .  aspirin EC 81 MG tablet, Take 81 mg by mouth every morning. , Disp: , Rfl:  .  atorvastatin (LIPITOR) 40 MG tablet, Take 1 tablet (40 mg total) by mouth daily., Disp: 90 tablet, Rfl: 1 .  bisacodyl (DULCOLAX) 10 MG suppository, Place 1 suppository (10 mg total) rectally daily as needed for moderate constipation., Disp: 12  suppository, Rfl: 0 .  buPROPion (WELLBUTRIN XL) 150 MG 24 hr tablet, Take 3 tablets (450 mg total) by mouth daily., Disp: 270 tablet, Rfl: 1 .  docusate sodium (COLACE) 100 MG capsule, Take 1 capsule (100 mg total) by mouth 2 (two) times daily., Disp: 10 capsule, Rfl: 0 .  escitalopram (LEXAPRO) 20 MG tablet, Take 1 tablet (20 mg total) by mouth daily., Disp: 90 tablet, Rfl: 1 .  fluticasone (FLONASE) 50 MCG/ACT nasal spray, Place 2 sprays into both nostrils daily., Disp: 16 g, Rfl: 0 .  glucose blood (ACCU-CHEK ACTIVE STRIPS) test strip, Use as instructed, Disp: 100 each, Rfl: 12 .  Lancets (ACCU-CHEK SOFT TOUCH) lancets, Use as instructed, Disp: 100 each, Rfl: 12 .  losartan-hydrochlorothiazide (HYZAAR) 100-25 MG tablet, Take 1 tablet by mouth daily., Disp: 90 tablet, Rfl: 1 .  metFORMIN (GLUCOPHAGE-XR) 500 MG 24 hr tablet, Take 1 tablet (500 mg total) by mouth daily with breakfast., Disp: 90 tablet, Rfl: 1 .  metoprolol succinate (TOPROL-XL) 50 MG 24 hr tablet, Take 1 tablet (50 mg total) by mouth every evening. Take with or immediately following a meal., Disp: 90 tablet, Rfl: 1 .  NARCAN 4 MG/0.1ML LIQD nasal spray kit, , Disp: , Rfl: 0 .  nitroGLYCERIN (NITROSTAT) 0.4 MG SL tablet, Place 1 tablet (0.4 mg total) under the tongue every 5  (five) minutes as needed. For chest pain., Disp: 30 tablet, Rfl: 0 .  omeprazole (PRILOSEC) 20 MG capsule, Take 1 capsule (20 mg total) by mouth daily., Disp: 90 capsule, Rfl: 1 .  oxyCODONE (OXY IR/ROXICODONE) 5 MG immediate release tablet, Take 1-2 tablets (5-10 mg total) by mouth every 4 (four) hours as needed for breakthrough pain. (Patient taking differently: Take 10 mg by mouth every 4 (four) hours as needed for breakthrough pain. ), Disp: 30 tablet, Rfl: 0  Current Facility-Administered Medications:  .  cyanocobalamin ((VITAMIN B-12)) injection 1,000 mcg, 1,000 mcg, Intramuscular, Q30 days, Steele Sizer, MD, 1,000 mcg at 12/16/17 0912  Allergies  Allergen Reactions  . Elavil [Amitriptyline Hcl] Rash  . Tape Rash    Paper Tape     ROS  Constitutional: Negative for fever or weight change.  Respiratory: Negative for cough and shortness of breath.   Cardiovascular: Positive for chest pain intermittent but no palpitations.  Gastrointestinal: Negative for abdominal pain, no bowel changes.  Musculoskeletal: Negative for gait problem or joint swelling.  Skin: Negative for rash.  Neurological: Negative for dizziness or headache.  No other specific complaints in a complete review of systems (except as listed in HPI above).  Objective  Vitals:   12/16/17 0912  BP: 134/72  Pulse: 77  Resp: 16  Temp: 97.7 F (36.5 C)  TempSrc: Oral  SpO2: 96%  Weight: 220 lb 6.4 oz (100 kg)  Height: 5' 7" (1.702 m)    Body mass index is 34.52 kg/m.  Physical Exam  Constitutional: Patient appears well-developed and well-nourished. Obese  No distress.  HEENT: head atraumatic, normocephalic, pupils equal and reactive to light,neck supple, throat within normal limits Cardiovascular: Normal rate, regular rhythm and normal heart sounds.  No murmur heard. No BLE edema. Pulmonary/Chest: Effort normal and breath sounds normal. No respiratory distress. Abdominal: Soft.  There is no  tenderness. Psychiatric: Patient has a normal mood and affect. behavior is normal. Judgment and thought content normal. Muscular Skeletal: decrease in hip flexion, pain during palpation of left lower back   Recent Results (from the past 2160 hour(s))  POCT HgB  A1C     Status: None   Collection Time: 12/16/17  9:12 AM  Result Value Ref Range   Hemoglobin A1C 6.2     Diabetic Foot Exam: Diabetic Foot Exam - Simple   Simple Foot Form Diabetic Foot exam was performed with the following findings:  Yes 12/16/2017 10:24 AM  Visual Inspection No deformities, no ulcerations, no other skin breakdown bilaterally:  Yes Sensation Testing Intact to touch and monofilament testing bilaterally:  Yes Pulse Check Posterior Tibialis and Dorsalis pulse intact bilaterally:  Yes Comments      PHQ2/9: Depression screen [facility] 2/9 12/16/2017 11/25/2017 05/11/2017 10/15/2016 10/01/2016  Decreased Interest 0 0 0 2 1  Down, Depressed, Hopeless 0 0 0 3 0  PHQ - 2 Score 0 0 0 5 1  Altered sleeping - - - 0 -  Tired, decreased energy - - - 2 -  Change in appetite - - - 0 -  Feeling bad or failure about yourself  - - - 0 -  Trouble concentrating - - - 0 -  Moving slowly or fidgety/restless - - - 0 -  Suicidal thoughts - - - 0 -  PHQ-9 Score - - - 7 -  Difficult doing work/chores - - - Somewhat difficult -     Fall Risk: Fall Risk  12/16/2017 11/25/2017 08/18/2017 05/11/2017 10/15/2016  Falls in the past year? _0   Number falls in past yr: - - - - -  Injury with Fall? - - - - -  Risk for fall due to : - - - - -  Follow up - - - - -    Functional Status Survey: Is the patient deaf or have difficulty hearing?: No Does the patient have difficulty seeing, even when wearing glasses/contacts?: No Does the patient have difficulty concentrating, remembering, or making decisions?: No Does the patient have difficulty walking or climbing stairs?: No Does the patient have difficulty dressing or bathing?:  No Does the patient have difficulty doing errands alone such as visiting a doctor's office or shopping?: No    Assessment & Plan  1. Dyslipidemia associated with type 2 diabetes mellitus ([place])  - POCT HgB A1C - Lipid panel - Urine Microalbumin w/creat. ratio  2. Lumbar back pain with radiculopathy affecting left lower extremity  Going to pain management , pain with medication 4/10 with oxycodone - Dr. Primus Bravo   3. Facet arthritis of lumbar region  She is afraid to have injections.   4. Depression, major, recurrent, mild (HCC)  Feeling well most of the time  5. Hypertension, benign  - COMPLETE METABOLIC PANEL WITH GFR - CBC with Differential/Platelet  6. B12 deficiency  - B12 today   7. CAD in native artery   8. Obstructive sleep apnea  Not wearing CPAP   9. Unstable angina (HCC)  On medication management denies recent chest pain

## 2017-12-17 ENCOUNTER — Ambulatory Visit: Payer: Self-pay | Admitting: *Deleted

## 2017-12-17 DIAGNOSIS — G4733 Obstructive sleep apnea (adult) (pediatric): Secondary | ICD-10-CM | POA: Diagnosis not present

## 2017-12-17 NOTE — Telephone Encounter (Signed)
Discussed reactions with Dr. Ancil Boozer and per [facility] web site all his reactions are normal side effects. Instructed to take Ibuprofen or Acetaminophen and symptoms should resolve. Also will report symptoms to VAERS (Vaccine Adverse Event Reporting System)    Most people got a sore arm with mild or moderate pain after getting Shingrix, and some also had redness and swelling where they got the shot. Some people felt tired, had muscle pain, a headache, shivering, fever, stomach pain, or nausea. About 1 out of 6 people who got Shingrix experienced side effects that prevented them from doing regular activities. Symptoms went away on their own in about 2 to 3 days. Side effects were more common in younger people.  You might have a reaction to the first or second dose of Shingrix, or both doses. If you experience side effects, you may choose to take over-the-counter pain medicine such as ibuprofen or acetaminophen.

## 2017-12-17 NOTE — Telephone Encounter (Signed)
  Patient's wife is calling with side effects her husband is having from the vaccine he received at the office yesterday. Call to office- patient conferenced to nurse. Reason for Disposition . [1] Fever > 101 F (38.3 C) AND [2] age > 79  Answer Assessment - Initial Assessment Questions 1. SYMPTOMS: "What is the main symptom?" (e.g., redness, swelling, pain)      Headache, hot and hard at site- not red 2. ONSET: "When was the vaccine (shot) given?" "How much later did the __________ begin?" (e.g., hours, days ago)      Yesterday am 9:30, symptoms began last night 3. SEVERITY: "How bad is it?"      Headache is so bad- has to keep eyes closed, joint pain 4. FEVER: "Is there a fever?" If so, ask: "What is it, how was it measured, and when did it start?"      101 oral thermometer 5. IMMUNIZATIONS GIVEN: "What shots have you recently received?"     Shingles vaccine 6. PAST REACTIONS: "Have you reacted to immunizations before?" If so, ask: "What happened?"     Never had reaction to vaccine 7. OTHER SYMPTOMS: "Do you have any other symptoms?"     Shaking/chills- fever, headache, warm to touch and hard area at shot site  Protocols used: IMMUNIZATION REACTIONS-A-AH

## 2017-12-19 NOTE — Telephone Encounter (Signed)
Please fill out a report and also contact patient Monday to see how he is doing

## 2017-12-20 ENCOUNTER — Telehealth: Payer: Self-pay | Admitting: Family Medicine

## 2017-12-20 ENCOUNTER — Telehealth: Payer: Self-pay

## 2017-12-20 NOTE — Telephone Encounter (Signed)
Copied from [place] [phone]. Topic: Quick Communication - See Telephone Encounter >> Dec 20, 2017 12:31 PM Synthia Innocent wrote: CRM for notification. See Telephone encounter for: 12/20/17. Pharmacy needs dosage clarifications on atorvastatin (LIPITOR) 40 MG tablet

## 2017-12-20 NOTE — Telephone Encounter (Signed)
-----   Message from Steele Sizer, MD sent at 12/20/2017  7:38 AM EDT ----- Glucose is at goal, normal kidney and liver function test Normal CBC Lipid panel is still showing low HDL ( good cholesterol) but has improved a little. Continue the hard work Normal urine micro  Please contact patient and see how he is doing. Shingles reaction

## 2017-12-20 NOTE — Telephone Encounter (Signed)
Left a message to have patient to call back regarding lab results and check on him from his shingrix vaccine reaction.

## 2017-12-20 NOTE — Telephone Encounter (Signed)
Pt given results per notes of Dr. Ancil Boozer on 12/20/17, patient verbalized understanding. He says the shingles vaccine reaction is finally gone away. Unable to document in result note due to result note not being routed to [facility].

## 2017-12-21 NOTE — Telephone Encounter (Signed)
Calling again, please verify how he is taking it

## 2017-12-22 NOTE — Telephone Encounter (Signed)
Patient states his symptoms have all resolved from the Shringex vaccine. Also states he is taking his Atorvastatin 40 mg once daily-does not know what the pharmacy was talking about when they called to clarify how he was taking it. [facility] wanted to know if he was taking it half a pill or every other day patient states he takes one daily as prescribed.

## 2017-12-23 LAB — COMPLETE METABOLIC PANEL WITH GFR
AG RATIO: 1.6 (calc) (ref 1.0–2.5)
ALKALINE PHOSPHATASE (APISO): 92 U/L (ref 40–115)
ALT: 34 U/L (ref 9–46)
AST: 24 U/L (ref 10–35)
Albumin: 3.9 g/dL (ref 3.6–5.1)
BUN / CREAT RATIO: 13 (calc) (ref 6–22)
BUN: 8 mg/dL (ref 7–25)
CALCIUM: 9.8 mg/dL (ref 8.6–10.3)
CHLORIDE: 102 mmol/L (ref 98–110)
CO2: 31 mmol/L (ref 20–32)
CREATININE: 0.62 mg/dL — AB (ref 0.70–1.25)
GFR, EST AFRICAN AMERICAN: 121 mL/min/{1.73_m2} (ref >=60)
GFR, Est Non African American: 104 mL/min/{1.73_m2} (ref >=60)
Globulin: 2.5 g/dL (calc) (ref 1.9–3.7)
Glucose, Bld: 92 mg/dL (ref 65–99)
Potassium: 4 mmol/L (ref 3.5–5.3)
SODIUM: 139 mmol/L (ref 135–146)
Total Bilirubin: 1.5 mg/dL — ABNORMAL HIGH (ref 0.2–1.2)
Total Protein: 6.4 g/dL (ref 6.1–8.1)

## 2017-12-23 LAB — LIPID PANEL
Cholesterol: 132 mg/dL (ref <200)
HDL: 31 mg/dL — ABNORMAL LOW (ref >40)
LDL Cholesterol (Calc): 77 mg/dL (calc)
NON-HDL CHOLESTEROL (CALC): 101 mg/dL (ref <130)
Total CHOL/HDL Ratio: 4.3 (calc) (ref <5.0)
Triglycerides: 139 mg/dL (ref <150)

## 2017-12-23 LAB — CBC WITH DIFFERENTIAL/PLATELET
BASOS ABS: 70 {cells}/uL (ref 0–200)
Basophils Relative: 1 %
Eosinophils Absolute: 231 cells/uL (ref 15–500)
Eosinophils Relative: 3.3 %
HEMATOCRIT: 45.9 % (ref 38.5–50.0)
Hemoglobin: 15.4 g/dL (ref 13.2–17.1)
LYMPHS ABS: 1918 {cells}/uL (ref 850–3900)
MCH: 30 pg (ref 27.0–33.0)
MCHC: 33.6 g/dL (ref 32.0–36.0)
MCV: 89.3 fL (ref 80.0–100.0)
MPV: 10.2 fL (ref 7.5–12.5)
Monocytes Relative: 7.2 %
Neutro Abs: 4277 cells/uL (ref 1500–7800)
Neutrophils Relative %: 61.1 %
Platelets: 308 10*3/uL (ref 140–400)
RBC: 5.14 10*6/uL (ref 4.20–5.80)
RDW: 12.8 % (ref 11.0–15.0)
TOTAL LYMPHOCYTE: 27.4 %
WBC: 7 10*3/uL (ref 3.8–10.8)
WBCMIX: 504 {cells}/uL (ref 200–950)

## 2017-12-23 LAB — MICROALBUMIN / CREATININE URINE RATIO
CREATININE, URINE: 64 mg/dL (ref 20–320)
MICROALB UR: 0.5 mg/dL
MICROALB/CREAT RATIO: 8 ug/mg{creat} (ref <30)

## 2017-12-28 DIAGNOSIS — Z96642 Presence of left artificial hip joint: Secondary | ICD-10-CM | POA: Diagnosis not present

## 2017-12-28 DIAGNOSIS — M9904 Segmental and somatic dysfunction of sacral region: Secondary | ICD-10-CM | POA: Diagnosis not present

## 2017-12-28 DIAGNOSIS — Z5181 Encounter for therapeutic drug level monitoring: Secondary | ICD-10-CM | POA: Diagnosis not present

## 2017-12-28 DIAGNOSIS — M545 Low back pain: Secondary | ICD-10-CM | POA: Diagnosis not present

## 2018-01-04 DIAGNOSIS — L720 Epidermal cyst: Secondary | ICD-10-CM | POA: Diagnosis not present

## 2018-01-04 DIAGNOSIS — L821 Other seborrheic keratosis: Secondary | ICD-10-CM | POA: Diagnosis not present

## 2018-01-04 DIAGNOSIS — L219 Seborrheic dermatitis, unspecified: Secondary | ICD-10-CM | POA: Diagnosis not present

## 2018-01-04 DIAGNOSIS — L918 Other hypertrophic disorders of the skin: Secondary | ICD-10-CM | POA: Diagnosis not present

## 2018-01-04 DIAGNOSIS — L820 Inflamed seborrheic keratosis: Secondary | ICD-10-CM | POA: Diagnosis not present

## 2018-01-17 DIAGNOSIS — G4733 Obstructive sleep apnea (adult) (pediatric): Secondary | ICD-10-CM | POA: Diagnosis not present

## 2018-01-18 ENCOUNTER — Ambulatory Visit (INDEPENDENT_AMBULATORY_CARE_PROVIDER_SITE_OTHER): Payer: Medicare Other

## 2018-01-18 DIAGNOSIS — E538 Deficiency of other specified B group vitamins: Secondary | ICD-10-CM

## 2018-01-24 DIAGNOSIS — M545 Low back pain: Secondary | ICD-10-CM | POA: Diagnosis not present

## 2018-01-24 DIAGNOSIS — M9904 Segmental and somatic dysfunction of sacral region: Secondary | ICD-10-CM | POA: Diagnosis not present

## 2018-01-24 DIAGNOSIS — Z96642 Presence of left artificial hip joint: Secondary | ICD-10-CM | POA: Diagnosis not present

## 2018-01-24 DIAGNOSIS — Z5181 Encounter for therapeutic drug level monitoring: Secondary | ICD-10-CM | POA: Diagnosis not present

## 2018-01-31 HISTORY — PX: SKIN CANCER EXCISION: SHX779

## 2018-02-01 DIAGNOSIS — I2510 Atherosclerotic heart disease of native coronary artery without angina pectoris: Secondary | ICD-10-CM | POA: Diagnosis not present

## 2018-02-01 DIAGNOSIS — E782 Mixed hyperlipidemia: Secondary | ICD-10-CM | POA: Diagnosis not present

## 2018-02-01 DIAGNOSIS — G4733 Obstructive sleep apnea (adult) (pediatric): Secondary | ICD-10-CM | POA: Diagnosis not present

## 2018-02-05 ENCOUNTER — Other Ambulatory Visit: Payer: Self-pay | Admitting: Family Medicine

## 2018-02-05 DIAGNOSIS — I2510 Atherosclerotic heart disease of native coronary artery without angina pectoris: Secondary | ICD-10-CM

## 2018-02-14 DIAGNOSIS — I2510 Atherosclerotic heart disease of native coronary artery without angina pectoris: Secondary | ICD-10-CM | POA: Diagnosis not present

## 2018-02-17 ENCOUNTER — Ambulatory Visit (INDEPENDENT_AMBULATORY_CARE_PROVIDER_SITE_OTHER): Payer: Medicare Other

## 2018-02-17 DIAGNOSIS — E538 Deficiency of other specified B group vitamins: Secondary | ICD-10-CM

## 2018-02-17 NOTE — Progress Notes (Signed)
Patient came in for her B-12 injection. He tolerated it well. NKDA.   

## 2018-02-18 ENCOUNTER — Ambulatory Visit (INDEPENDENT_AMBULATORY_CARE_PROVIDER_SITE_OTHER): Payer: Medicare Other | Admitting: Family Medicine

## 2018-02-18 ENCOUNTER — Encounter: Payer: Self-pay | Admitting: Family Medicine

## 2018-02-18 VITALS — BP 124/80 | HR 73 | Temp 98.5°F | Resp 18 | Ht 67.0 in | Wt 219.0 lb

## 2018-02-18 DIAGNOSIS — I6789 Other cerebrovascular disease: Secondary | ICD-10-CM

## 2018-02-18 DIAGNOSIS — I10 Essential (primary) hypertension: Secondary | ICD-10-CM

## 2018-02-18 DIAGNOSIS — H8113 Benign paroxysmal vertigo, bilateral: Secondary | ICD-10-CM | POA: Diagnosis not present

## 2018-02-18 DIAGNOSIS — I679 Cerebrovascular disease, unspecified: Secondary | ICD-10-CM | POA: Diagnosis not present

## 2018-02-18 DIAGNOSIS — R51 Headache: Secondary | ICD-10-CM | POA: Diagnosis not present

## 2018-02-18 DIAGNOSIS — R519 Headache, unspecified: Secondary | ICD-10-CM

## 2018-02-18 NOTE — Progress Notes (Addendum)
Name: Jimmy Green   MRN: [id number]    DOB: [birth year]   Date:02/18/2018       Progress Note  Subjective  Chief Complaint  Chief Complaint  Patient presents with  . Headache    HPI  Pt presents with concern for ongoing headaches.  Has history of headaches for a few years now.  He was prescribed Fiorinal back in 2018, but has since started with pain management and he cannot take any controlled substances outside of his pain management clinic. Pain is just behind the right eye, occasionally radiates into the left eye; headaches occur almost daily and last for several hours.  He does note some visual changes during headaches.  He has taken tylenol in the past and it did not help.  He has history of vertigo, but this has been under fair control lately.  - Had CT head back in 2017 and he had cerebral microvascular disease and old lacunar infarcts. - He denies facial droop, slurred speech, limb weakness, numbness/tingling.   His father had a history of cluster headaches.   Patient Active Problem List   Diagnosis Date Noted  . Primary localized osteoarthritis of left hip 04/06/2017  . Dyslipidemia associated with type 2 diabetes mellitus ([place]) 10/04/2016  . Trigger thumb of both thumbs 06/02/2016  . Chronic tension-type headache, intractable 09/25/2015  . Benign paroxysmal positional vertigo due to bilateral vestibular disorder 09/25/2015  . Hearing loss sensory, bilateral 08/26/2015  . Cerebral microvascular disease 08/26/2015  . Metabolic syndrome 03/55/9741  . Vertigo 06/21/2015  . Buzzing in ear 06/21/2015  . Hypertension goal BP (blood pressure) < 140/90 03/28/2015  . GERD (gastroesophageal reflux disease) 03/28/2015  . Hyperlipidemia 03/28/2015  . Depression, major 03/28/2015  . CAD in native artery 03/28/2015  . Actinic keratosis 02/14/2015  . Obstructive sleep apnea 02/03/2015  . Diverticulosis of colon without hemorrhage 01/31/2015  . Hx of colonic polyps   . Benign  neoplasm of descending colon   . Benign neoplasm of sigmoid colon   . Idiopathic colitis   . Sebaceous cyst 09/14/2014    Social History   Tobacco Use  . Smoking status: Former Smoker    Years: 40.00    Types: Cigarettes    Last attempt to quit: 08/03/2012    Years since quitting: 5.5  . Smokeless tobacco: Never Used  Substance Use Topics  . Alcohol use: No    Alcohol/week: 0.0 oz     Current Outpatient Medications:  .  albuterol (PROVENTIL HFA;VENTOLIN HFA) 108 (90 Base) MCG/ACT inhaler, Inhale 2 puffs into the lungs every 6 (six) hours as needed for wheezing or shortness of breath., Disp: 1 Inhaler, Rfl: 0 .  amLODipine (NORVASC) 2.5 MG tablet, Take 1 tablet (2.5 mg total) by mouth daily., Disp: 90 tablet, Rfl: 1 .  aspirin EC 81 MG tablet, Take 81 mg by mouth every morning. , Disp: , Rfl:  .  atorvastatin (LIPITOR) 40 MG tablet, Take 1 tablet (40 mg total) by mouth daily., Disp: 90 tablet, Rfl: 1 .  bisacodyl (DULCOLAX) 10 MG suppository, Place 1 suppository (10 mg total) rectally daily as needed for moderate constipation., Disp: 12 suppository, Rfl: 0 .  buPROPion (WELLBUTRIN XL) 150 MG 24 hr tablet, Take 3 tablets (450 mg total) by mouth daily., Disp: 270 tablet, Rfl: 1 .  docusate sodium (COLACE) 100 MG capsule, Take 1 capsule (100 mg total) by mouth 2 (two) times daily., Disp: 10 capsule, Rfl: 0 .  escitalopram (LEXAPRO) 20  MG tablet, Take 1 tablet (20 mg total) by mouth daily., Disp: 90 tablet, Rfl: 1 .  fluticasone (FLONASE) 50 MCG/ACT nasal spray, Place 2 sprays into both nostrils daily., Disp: 16 g, Rfl: 0 .  glucose blood (ACCU-CHEK ACTIVE STRIPS) test strip, Use as instructed, Disp: 100 each, Rfl: 12 .  Lancets (ACCU-CHEK SOFT TOUCH) lancets, Use as instructed, Disp: 100 each, Rfl: 12 .  losartan-hydrochlorothiazide (HYZAAR) 100-25 MG tablet, Take 1 tablet by mouth daily., Disp: 90 tablet, Rfl: 1 .  metFORMIN (GLUCOPHAGE-XR) 500 MG 24 hr tablet, Take 1 tablet (500 mg  total) by mouth daily with breakfast., Disp: 90 tablet, Rfl: 1 .  metoprolol succinate (TOPROL-XL) 50 MG 24 hr tablet, Take 1 tablet (50 mg total) by mouth every evening. Take with or immediately following a meal., Disp: 90 tablet, Rfl: 1 .  NARCAN 4 MG/0.1ML LIQD nasal spray kit, , Disp: , Rfl: 0 .  nitroGLYCERIN (NITROSTAT) 0.4 MG SL tablet, DISSOLVE 1 TABLET UNDER THE TONGUE EVERY 5 MINUTES FOR UP TO 3 DOSES AS NEEDED FOR CHEST PAIN. IF NO RELIEF AFTER 3 DOSES, CALL 911 OR GO TO, Disp: 25 tablet, Rfl: 1 .  nitroGLYCERIN (NITROSTAT) 0.4 MG SL tablet, DISSOLVE 1 TABLET UNDER THE TONGUE EVERY 5 MINUTES FOR UP TO 3 DOSES AS NEEDED FOR CHEST PAIN. IF NO RELIEF AFTER 3 DOSES, CALL 911 OR GO TO, Disp: 25 tablet, Rfl: 0 .  omeprazole (PRILOSEC) 20 MG capsule, Take 1 capsule (20 mg total) by mouth daily., Disp: 90 capsule, Rfl: 1 .  oxyCODONE (OXY IR/ROXICODONE) 5 MG immediate release tablet, Take 1-2 tablets (5-10 mg total) by mouth every 4 (four) hours as needed for breakthrough pain. (Patient taking differently: Take 10 mg by mouth every 4 (four) hours as needed for breakthrough pain. ), Disp: 30 tablet, Rfl: 0  Current Facility-Administered Medications:  .  cyanocobalamin ((VITAMIN B-12)) injection 1,000 mcg, 1,000 mcg, Intramuscular, Q30 days, Steele Sizer, MD, 1,000 mcg at 02/17/18 0856  Allergies  Allergen Reactions  . Elavil [Amitriptyline Hcl] Rash  . Tape Rash    Paper Tape    Review of Systems  Constitutional: Negative for chills, fever and malaise/fatigue.  Eyes: Negative for blurred vision, photophobia and redness.  Respiratory: Negative for shortness of breath.   Cardiovascular: Negative for chest pain and palpitations.  Gastrointestinal: Negative for nausea and vomiting.  Musculoskeletal: Positive for back pain. Negative for joint pain and neck pain.       Chronic back pain  Skin: Negative for rash.  Neurological: Negative for dizziness, tingling, sensory change, speech change,  focal weakness and headaches.  Endo/Heme/Allergies: Negative for environmental allergies.  Psychiatric/Behavioral: Positive for depression. Negative for hallucinations and memory loss.   Objective  Vitals:   02/18/18 0825  BP: 124/80  Pulse: 73  Resp: 18  Temp: 98.5 F (36.9 C)  TempSrc: Oral  SpO2: 99%  Weight: 219 lb (99.3 kg)  Height: '5\' 7"'  (1.702 m)   Body mass index is 34.3 kg/m.  Nursing Note and Vital Signs reviewed.  Physical Exam  Constitutional: He is oriented to person, place, and time. He appears well-developed and well-nourished.  HENT:  Head: Normocephalic and atraumatic.  Right Ear: Tympanic membrane, external ear and ear canal normal.  Left Ear: Tympanic membrane, external ear and ear canal normal.  Nose: Nose normal.  Mouth/Throat: Uvula is midline, oropharynx is clear and moist and mucous membranes are normal. No oropharyngeal exudate. No tonsillar exudate.  Eyes: Pupils are equal,  round, and reactive to light. Conjunctivae and EOM are normal. No scleral icterus.  Neck: Normal range of motion. Neck supple.  Cardiovascular: Normal rate, regular rhythm and normal heart sounds.  Pulmonary/Chest: Effort normal and breath sounds normal. No respiratory distress. He has no wheezes. He has no rales. He exhibits no tenderness.  Abdominal: Soft. Bowel sounds are normal. He exhibits no mass. There is no tenderness. There is no rebound.  Musculoskeletal: Normal range of motion. He exhibits no edema.  Lymphadenopathy:    He has no cervical adenopathy.  Neurological: He is alert and oriented to person, place, and time. He has normal strength. No cranial nerve deficit or sensory deficit. He displays a negative Romberg sign. Coordination and gait normal.  Skin: Skin is warm and dry. No rash noted. No erythema.  Psychiatric: He has a normal mood and affect. His behavior is normal. Judgment and thought content normal.  Nursing note and vitals reviewed.  No results found  for this or any previous visit (from the past 72 hour(s)).  Assessment & Plan  1. Chronic nonintractable headache, unspecified headache type - Ambulatory referral to Neurology  2. Cerebral microvascular disease - Ambulatory referral to Neurology  3. Benign paroxysmal positional vertigo due to bilateral vestibular disorder - Ambulatory referral to Neurology  4. Hypertension goal BP (blood pressure) < 140/90 - Ambulatory referral to Neurology  - Advised that due to his complex medical history including history of abnormal CT Brain, vertigo, HTN, cerebral microvascular dz, and CV disease I recommend that he see neurology for further evaluation. Signs and symptoms of acute stroke are reviewed in detail with teach back.  -Red flags and when to present for emergency care or RTC including fever >101.48F, chest pain, shortness of breath, new/worsening/un-resolving symptoms, signs of stroke (reviewed with patient) reviewed with patient at time of visit. Follow up and care instructions discussed and provided in AVS.

## 2018-02-21 DIAGNOSIS — M545 Low back pain: Secondary | ICD-10-CM | POA: Diagnosis not present

## 2018-02-21 DIAGNOSIS — C44519 Basal cell carcinoma of skin of other part of trunk: Secondary | ICD-10-CM | POA: Diagnosis not present

## 2018-02-21 DIAGNOSIS — Z5181 Encounter for therapeutic drug level monitoring: Secondary | ICD-10-CM | POA: Diagnosis not present

## 2018-02-21 DIAGNOSIS — Z96642 Presence of left artificial hip joint: Secondary | ICD-10-CM | POA: Diagnosis not present

## 2018-02-21 DIAGNOSIS — C4491 Basal cell carcinoma of skin, unspecified: Secondary | ICD-10-CM

## 2018-02-21 DIAGNOSIS — C44619 Basal cell carcinoma of skin of left upper limb, including shoulder: Secondary | ICD-10-CM | POA: Diagnosis not present

## 2018-02-21 DIAGNOSIS — M9904 Segmental and somatic dysfunction of sacral region: Secondary | ICD-10-CM | POA: Diagnosis not present

## 2018-02-21 HISTORY — PX: BASAL CELL CARCINOMA EXCISION: SHX1214

## 2018-02-21 HISTORY — DX: Basal cell carcinoma of skin, unspecified: C44.91

## 2018-02-23 HISTORY — PX: BASAL CELL CARCINOMA EXCISION: SHX1214

## 2018-03-07 DIAGNOSIS — I2510 Atherosclerotic heart disease of native coronary artery without angina pectoris: Secondary | ICD-10-CM | POA: Diagnosis not present

## 2018-03-07 DIAGNOSIS — G4733 Obstructive sleep apnea (adult) (pediatric): Secondary | ICD-10-CM | POA: Diagnosis not present

## 2018-03-07 DIAGNOSIS — E782 Mixed hyperlipidemia: Secondary | ICD-10-CM | POA: Diagnosis not present

## 2018-03-18 ENCOUNTER — Other Ambulatory Visit: Payer: Self-pay | Admitting: Family Medicine

## 2018-03-18 ENCOUNTER — Ambulatory Visit (INDEPENDENT_AMBULATORY_CARE_PROVIDER_SITE_OTHER): Payer: Medicare Other | Admitting: Family Medicine

## 2018-03-18 DIAGNOSIS — E538 Deficiency of other specified B group vitamins: Secondary | ICD-10-CM | POA: Diagnosis not present

## 2018-03-18 DIAGNOSIS — F330 Major depressive disorder, recurrent, mild: Secondary | ICD-10-CM

## 2018-03-18 DIAGNOSIS — K219 Gastro-esophageal reflux disease without esophagitis: Secondary | ICD-10-CM

## 2018-03-21 DIAGNOSIS — M545 Low back pain: Secondary | ICD-10-CM | POA: Diagnosis not present

## 2018-03-21 DIAGNOSIS — Z5181 Encounter for therapeutic drug level monitoring: Secondary | ICD-10-CM | POA: Diagnosis not present

## 2018-03-21 DIAGNOSIS — Z96642 Presence of left artificial hip joint: Secondary | ICD-10-CM | POA: Diagnosis not present

## 2018-03-21 DIAGNOSIS — M9904 Segmental and somatic dysfunction of sacral region: Secondary | ICD-10-CM | POA: Diagnosis not present

## 2018-03-23 ENCOUNTER — Encounter: Payer: Self-pay | Admitting: Family Medicine

## 2018-03-23 DIAGNOSIS — Z85828 Personal history of other malignant neoplasm of skin: Secondary | ICD-10-CM | POA: Insufficient documentation

## 2018-03-24 ENCOUNTER — Other Ambulatory Visit: Payer: Self-pay | Admitting: Family Medicine

## 2018-03-24 ENCOUNTER — Encounter: Payer: Self-pay | Admitting: Family Medicine

## 2018-03-24 DIAGNOSIS — F330 Major depressive disorder, recurrent, mild: Secondary | ICD-10-CM

## 2018-03-28 DIAGNOSIS — C4491 Basal cell carcinoma of skin, unspecified: Secondary | ICD-10-CM | POA: Insufficient documentation

## 2018-03-28 IMAGING — XA DG HIP (WITH PELVIS) OPERATIVE*L*
1 series · 4 of 4 positions shown · non-contrast
Comparison: 07/02/2016

CLINICAL DATA: Left total hip splint

EXAM:
OPERATIVE LEFT HIP (WITH PELVIS IF PERFORMED) 1 VIEWS
TECHNIQUE: Fluoroscopic spot image(s) were submitted for interpretation
post-operatively.

[Series 9: ortho standard · 4 of 9 frames shown]
[frame 2/9]
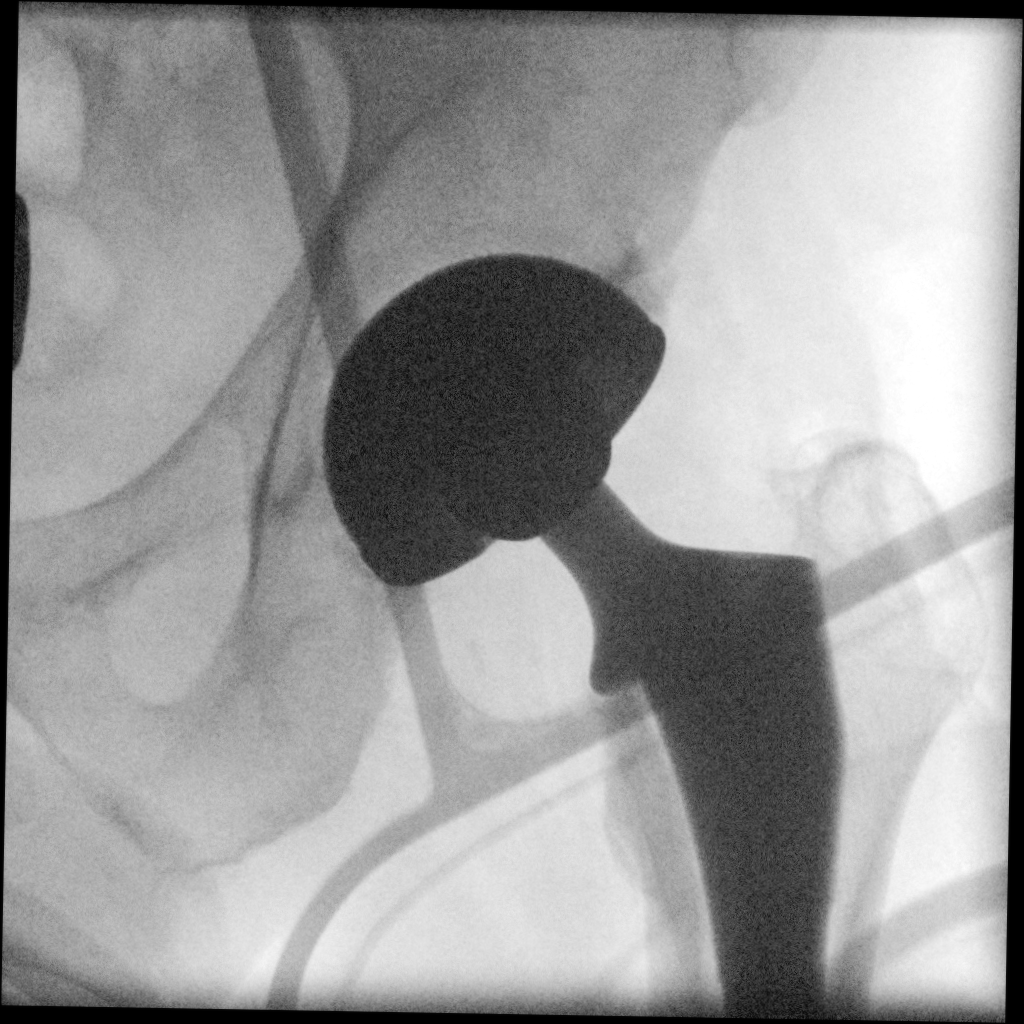
[frame 5/9]
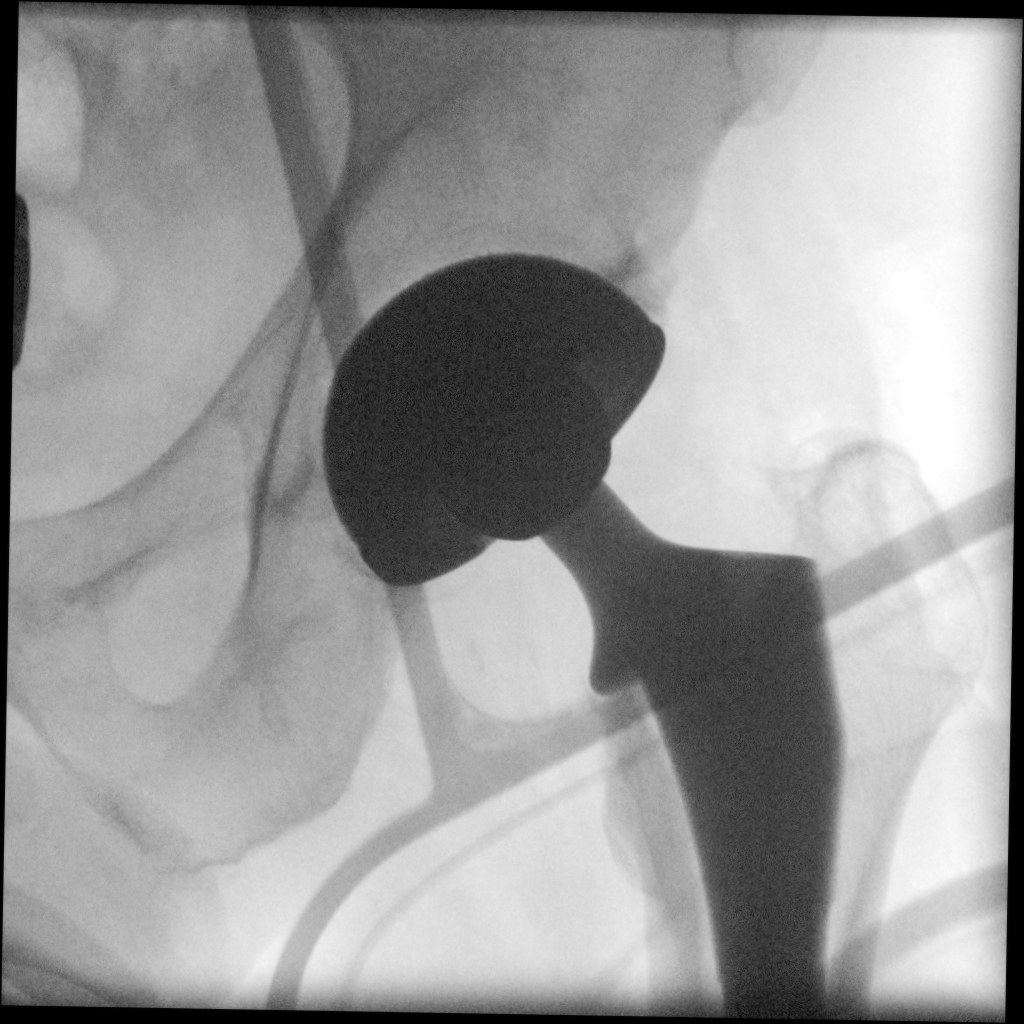
[frame 8/9]
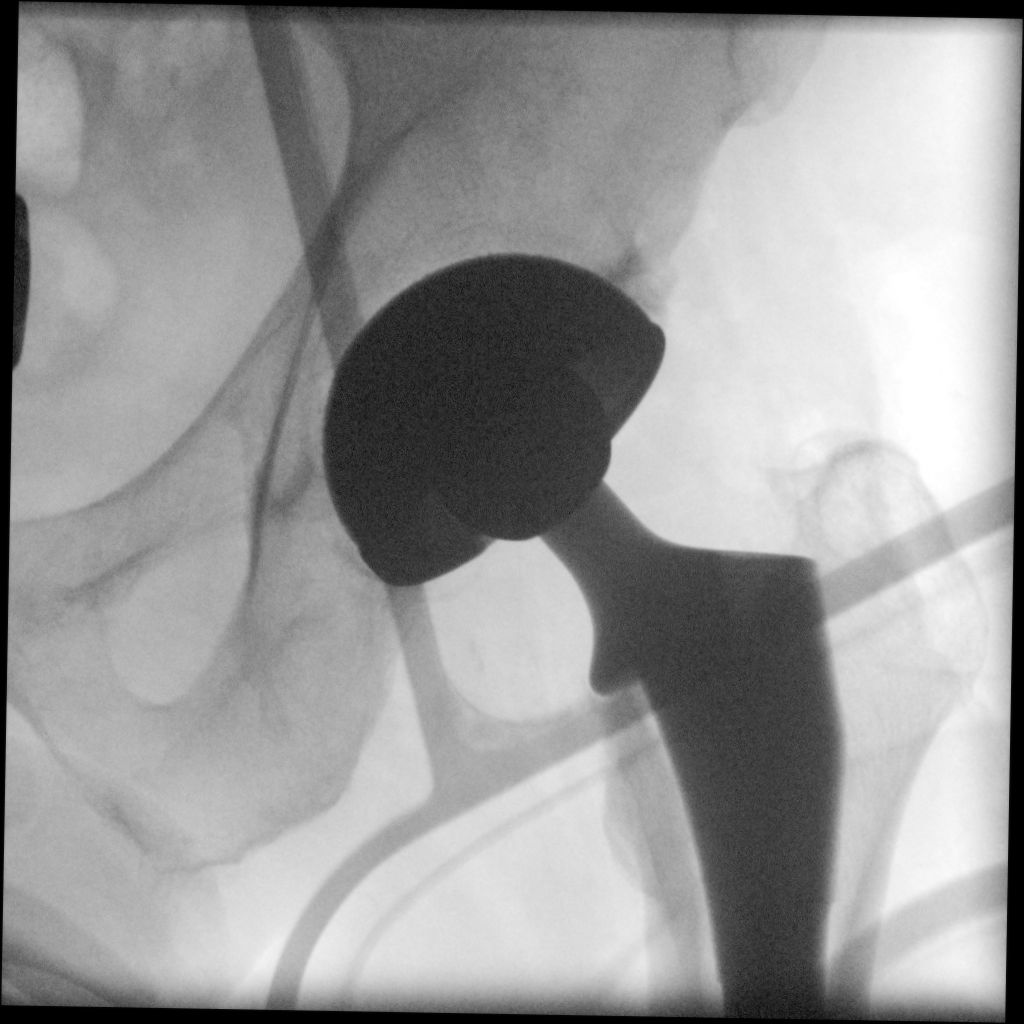
[frame 9/9]
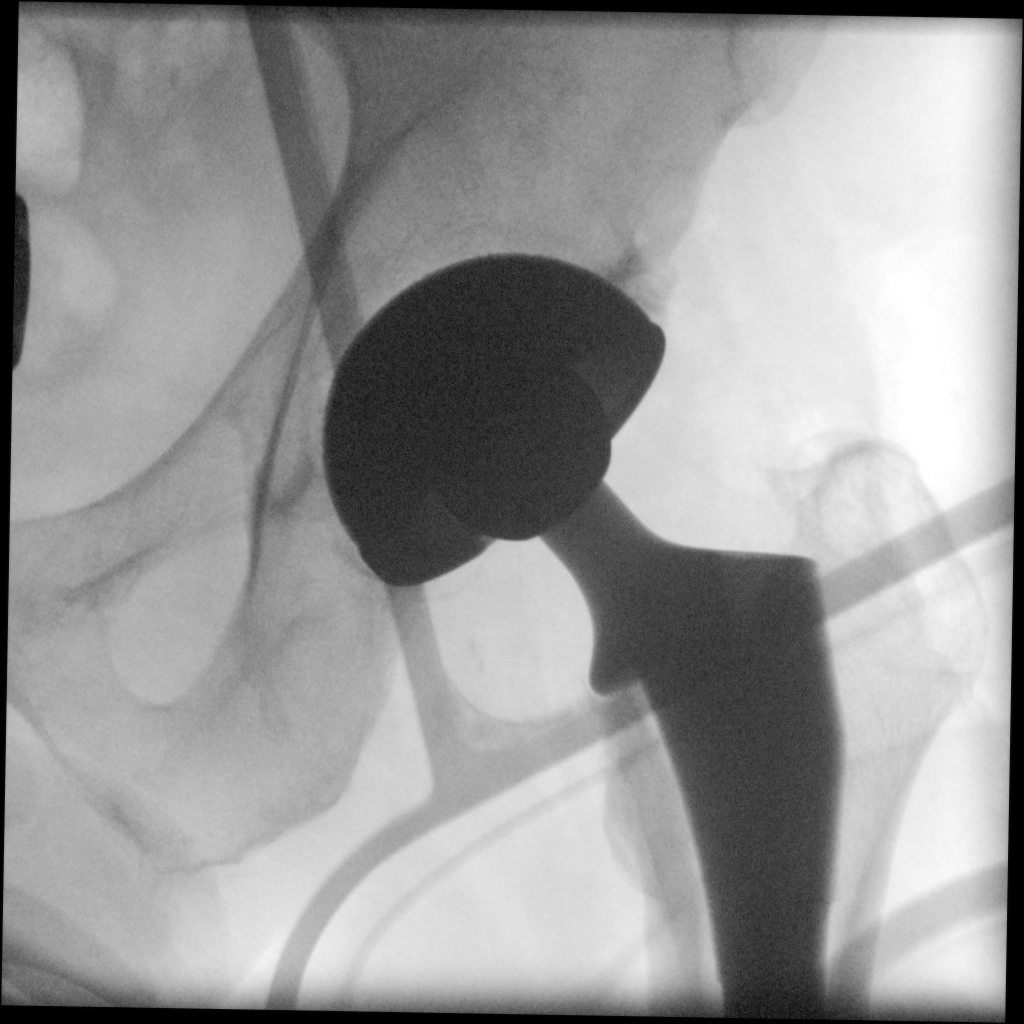

[4 of 4 positions shown; findings below may reference images not displayed]

FINDINGS: Changes of left hip replacement. Normal AP alignment. No visible
complicating feature.
IMPRESSION: Left hip replacement.  No visible complicating feature.

## 2018-03-31 ENCOUNTER — Encounter: Payer: Self-pay | Admitting: Family Medicine

## 2018-04-18 ENCOUNTER — Ambulatory Visit: Payer: Medicare Other | Admitting: Family Medicine

## 2018-04-18 ENCOUNTER — Other Ambulatory Visit: Payer: Self-pay | Admitting: Family Medicine

## 2018-04-18 DIAGNOSIS — Z5181 Encounter for therapeutic drug level monitoring: Secondary | ICD-10-CM | POA: Diagnosis not present

## 2018-04-18 DIAGNOSIS — M9904 Segmental and somatic dysfunction of sacral region: Secondary | ICD-10-CM | POA: Diagnosis not present

## 2018-04-18 DIAGNOSIS — Z96642 Presence of left artificial hip joint: Secondary | ICD-10-CM | POA: Diagnosis not present

## 2018-04-18 DIAGNOSIS — I10 Essential (primary) hypertension: Secondary | ICD-10-CM

## 2018-04-18 DIAGNOSIS — M545 Low back pain: Secondary | ICD-10-CM | POA: Diagnosis not present

## 2018-04-18 NOTE — Telephone Encounter (Signed)
Hypertension medication request: Losartan-HCTZ and Amlodipine   Last office visit pertaining to hypertension: 03/18/2018   BP Readings from Last 3 Encounters:  02/18/18 124/80  12/16/17 134/72  11/25/17 130/78    Lab Results  Component Value Date   CREATININE 0.62 (L) 12/16/2017   BUN 8 12/16/2017   NA 139 12/16/2017   K 4.0 12/16/2017   CL 102 12/16/2017   CO2 31 12/16/2017     Follow up on 05/20/2018

## 2018-04-25 ENCOUNTER — Ambulatory Visit (INDEPENDENT_AMBULATORY_CARE_PROVIDER_SITE_OTHER): Payer: Medicare Other

## 2018-04-25 DIAGNOSIS — E538 Deficiency of other specified B group vitamins: Secondary | ICD-10-CM

## 2018-04-25 NOTE — Progress Notes (Signed)
Patient came in for his B-12 injection. He tolerated it well. NKDA.   

## 2018-05-05 ENCOUNTER — Other Ambulatory Visit: Payer: Self-pay | Admitting: Family Medicine

## 2018-05-05 DIAGNOSIS — E1169 Type 2 diabetes mellitus with other specified complication: Secondary | ICD-10-CM

## 2018-05-05 DIAGNOSIS — E785 Hyperlipidemia, unspecified: Principal | ICD-10-CM

## 2018-05-16 DIAGNOSIS — Z96642 Presence of left artificial hip joint: Secondary | ICD-10-CM | POA: Diagnosis not present

## 2018-05-16 DIAGNOSIS — Z5181 Encounter for therapeutic drug level monitoring: Secondary | ICD-10-CM | POA: Diagnosis not present

## 2018-05-16 DIAGNOSIS — M9904 Segmental and somatic dysfunction of sacral region: Secondary | ICD-10-CM | POA: Diagnosis not present

## 2018-05-16 DIAGNOSIS — M545 Low back pain: Secondary | ICD-10-CM | POA: Diagnosis not present

## 2018-05-20 ENCOUNTER — Ambulatory Visit (INDEPENDENT_AMBULATORY_CARE_PROVIDER_SITE_OTHER): Payer: Medicare Other | Admitting: Family Medicine

## 2018-05-20 ENCOUNTER — Encounter: Payer: Self-pay | Admitting: Family Medicine

## 2018-05-20 ENCOUNTER — Ambulatory Visit: Payer: Medicare Other

## 2018-05-20 VITALS — BP 136/64 | HR 79 | Temp 97.9°F | Resp 16 | Ht 67.0 in | Wt 221.8 lb

## 2018-05-20 DIAGNOSIS — N138 Other obstructive and reflux uropathy: Secondary | ICD-10-CM | POA: Insufficient documentation

## 2018-05-20 DIAGNOSIS — E782 Mixed hyperlipidemia: Secondary | ICD-10-CM | POA: Diagnosis not present

## 2018-05-20 DIAGNOSIS — Z23 Encounter for immunization: Secondary | ICD-10-CM

## 2018-05-20 DIAGNOSIS — E785 Hyperlipidemia, unspecified: Secondary | ICD-10-CM | POA: Diagnosis not present

## 2018-05-20 DIAGNOSIS — I200 Unstable angina: Secondary | ICD-10-CM

## 2018-05-20 DIAGNOSIS — I10 Essential (primary) hypertension: Secondary | ICD-10-CM | POA: Diagnosis not present

## 2018-05-20 DIAGNOSIS — I2510 Atherosclerotic heart disease of native coronary artery without angina pectoris: Secondary | ICD-10-CM

## 2018-05-20 DIAGNOSIS — N401 Enlarged prostate with lower urinary tract symptoms: Secondary | ICD-10-CM

## 2018-05-20 DIAGNOSIS — Z0000 Encounter for general adult medical examination without abnormal findings: Secondary | ICD-10-CM | POA: Diagnosis not present

## 2018-05-20 DIAGNOSIS — F330 Major depressive disorder, recurrent, mild: Secondary | ICD-10-CM

## 2018-05-20 DIAGNOSIS — C4491 Basal cell carcinoma of skin, unspecified: Secondary | ICD-10-CM

## 2018-05-20 DIAGNOSIS — E538 Deficiency of other specified B group vitamins: Secondary | ICD-10-CM | POA: Diagnosis not present

## 2018-05-20 DIAGNOSIS — E1169 Type 2 diabetes mellitus with other specified complication: Secondary | ICD-10-CM

## 2018-05-20 MED ORDER — BUPROPION HCL ER (XL) 150 MG PO TB24: 450.0000 mg | ORAL_TABLET | Freq: Every day | ORAL | 1 refills | Status: DC

## 2018-05-20 MED ORDER — METOPROLOL SUCCINATE ER 50 MG PO TB24: 50.0000 mg | ORAL_TABLET | Freq: Every evening | ORAL | 1 refills | Status: DC

## 2018-05-20 MED ORDER — ATORVASTATIN CALCIUM 40 MG PO TABS: 40.0000 mg | ORAL_TABLET | Freq: Every day | ORAL | 1 refills | Status: DC

## 2018-05-20 MED ORDER — CYANOCOBALAMIN 1000 MCG/ML IJ SOLN
1000.0000 ug | Freq: Once | INTRAMUSCULAR | Status: AC
  Administered 2018-05-20: 1000 ug via INTRAMUSCULAR

## 2018-05-20 MED ORDER — METFORMIN HCL ER 500 MG PO TB24: 500.0000 mg | ORAL_TABLET | Freq: Every day | ORAL | 0 refills | Status: DC

## 2018-05-20 NOTE — Patient Instructions (Signed)
Check out the happiness podcast   Preventive Care 65 Years and Older, Male Preventive care refers to lifestyle choices and visits with your health care provider that can promote health and wellness. What does preventive care include?  A yearly physical exam. This is also called an annual well check.  Dental exams once or twice a year.  Routine eye exams. Ask your health care provider how often you should have your eyes checked.  Personal lifestyle choices, including: ? Daily care of your teeth and gums. ? Regular physical activity. ? Eating a healthy diet. ? Avoiding tobacco and drug use. ? Limiting alcohol use. ? Practicing safe sex. ? Taking low doses of aspirin every day. ? Taking vitamin and mineral supplements as recommended by your health care provider. What happens during an annual well check? The services and screenings done by your health care provider during your annual well check will depend on your age, overall health, lifestyle risk factors, and family history of disease. Counseling Your health care provider may ask you questions about your:  Alcohol use.  Tobacco use.  Drug use.  Emotional well-being.  Home and relationship well-being.  Sexual activity.  Eating habits.  History of falls.  Memory and ability to understand (cognition).  Work and work Statistician.  Screening You may have the following tests or measurements:  Height, weight, and BMI.  Blood pressure.  Lipid and cholesterol levels. These may be checked every 5 years, or more frequently if you are over 23 years old.  Skin check.  Lung cancer screening. You may have this screening every year starting at age 32 if you have a 30-pack-year history of smoking and currently smoke or have quit within the past 15 years.  Fecal occult blood test (FOBT) of the stool. You may have this test every year starting at age 48.  Flexible sigmoidoscopy or colonoscopy. You may have a sigmoidoscopy  every 5 years or a colonoscopy every 10 years starting at age 82.  Prostate cancer screening. Recommendations will vary depending on your family history and other risks.  Hepatitis C blood test.  Hepatitis B blood test.  Sexually transmitted disease (STD) testing.  Diabetes screening. This is done by checking your blood sugar (glucose) after you have not eaten for a while (fasting). You may have this done every 1-3 years.  Abdominal aortic aneurysm (AAA) screening. You may need this if you are a current or former smoker.  Osteoporosis. You may be screened starting at age 84 if you are at high risk.  Talk with your health care provider about your test results, treatment options, and if necessary, the need for more tests. Vaccines Your health care provider may recommend certain vaccines, such as:  Influenza vaccine. This is recommended every year.  Tetanus, diphtheria, and acellular pertussis (Tdap, Td) vaccine. You may need a Td booster every 10 years.  Varicella vaccine. You may need this if you have not been vaccinated.  Zoster vaccine. You may need this after age 17.  Measles, mumps, and rubella (MMR) vaccine. You may need at least one dose of MMR if you were born in 1957 or later. You may also need a second dose.  Pneumococcal 13-valent conjugate (PCV13) vaccine. One dose is recommended after age 80.  Pneumococcal polysaccharide (PPSV23) vaccine. One dose is recommended after age 54.  Meningococcal vaccine. You may need this if you have certain conditions.  Hepatitis A vaccine. You may need this if you have certain conditions or if you  travel or work in places where you may be exposed to hepatitis A.  Hepatitis B vaccine. You may need this if you have certain conditions or if you travel or work in places where you may be exposed to hepatitis B.  Haemophilus influenzae type b (Hib) vaccine. You may need this if you have certain risk factors.  Talk to your health care  provider about which screenings and vaccines you need and how often you need them. This information is not intended to replace advice given to you by your health care provider. Make sure you discuss any questions you have with your health care provider. Document Released: 08/16/2015 Document Revised: 04/08/2016 Document Reviewed: 05/21/2015 Elsevier Interactive Patient Education  [employer].

## 2018-05-20 NOTE — Progress Notes (Signed)
Name: Jimmy Green   MRN: [id number]    DOB: April 13, 1951   Date:05/20/2018       Progress Note  Subjective  Chief Complaint  Chief Complaint  Patient presents with  . Annual Exam  . Immunizations    high dose flu  . B12 Injection  . Labs Only    testosterone    HPI  Patient presents for annual CPE and follow up  Depression Major:taking medication daily, no suicidal thoughts or ideation, he states getting frustrated because always going to the doctor. PHq9 slightly up today, we will not change medication, advised to listen to the happiness podcast and discussed counseling   Dyslipidemia with type II DM: he denies polyphagia, polydipsia or polyuria. His hgbA1C was 6.7% diagnosed March 2018,hgbA1C down to 6.2  He has changed his diet. He is going to join [employer]. He has been taking aspirin daily, ARBand statin therapy.Eye exam is due.  Taking metforminas recommended, denies side effects  HTN: bp is at goal , she has been taking medication daily.BP has been controlled still around 130/70's. He denies chest He has not been checking his bp at home  Hyperlipidemia: taking Atorvastatin and denies side effects. No myalgia. Reviewed last labs, and ways to increase HDL    OSA: he got a CPAP  mask was not fitting properly and causing right forehead pain , ordered by Dr. Ubaldo Glassing, he stopped using it.   History of basal cell carcinoma: Seeing Dermatologist  , Dr. Nicole Kindred, he is now going to be seen at [facility]   CAD: taking aspirin, statin and beta blocker and ARB,and he has NTG in his pocket, but has not used it yet. . Nochest pain or decrease in exercise tolerance. He was seen by Dr. Ubaldo Glassing in01/2019, and had another heart cath that showed no significant disease, 55-70% stenosis on the LAD , on medical management. Unchanged  Left hip replacement history : under the care of Dr. Primus Bravo on pain medication and pain today is 4/10 but not on hip, but on his back.   Diet: he has been avoiding  fried food, eating more fruit and vegetables Exercise: resuming physical activity now - he was not very active after hip surgery   Depression:  Depression screen [facility] 2/9 05/20/2018 02/18/2018 12/16/2017 11/25/2017 05/11/2017  Decreased Interest 0 0 0 0 0  Down, Depressed, Hopeless 0 0 0 0 0  PHQ - 2 Score 0 0 0 0 0  Altered sleeping 0 0 - - -  Tired, decreased energy 3 1 - - -  Change in appetite 0 0 - - -  Feeling bad or failure about yourself  0 0 - - -  Trouble concentrating 2 0 - - -  Moving slowly or fidgety/restless 0 0 - - -  Suicidal thoughts 0 0 - - -  PHQ-9 Score 5 - - - -  Difficult doing work/chores Not difficult at all Not difficult at all - - -    Hypertension:  BP Readings from Last 3 Encounters:  05/20/18 136/64  02/18/18 124/80  12/16/17 134/72    Obesity: Wt Readings from Last 3 Encounters:  05/20/18 221 lb 12.8 oz (100.6 kg)  02/18/18 219 lb (99.3 kg)  12/16/17 220 lb 6.4 oz (100 kg)   BMI Readings from Last 3 Encounters:  05/20/18 34.74 kg/m  02/18/18 34.30 kg/m  12/16/17 34.52 kg/m     Lipids:  Lab Results  Component Value Date   CHOL 132 12/16/2017  CHOL 132 10/01/2016   CHOL 149 06/07/2015   Lab Results  Component Value Date   HDL 31 (L) 12/16/2017   HDL 27 (L) 10/01/2016   HDL 28 (L) 06/07/2015   Lab Results  Component Value Date   LDLCALC 77 12/16/2017   LDLCALC 76 10/01/2016   LDLCALC 94 06/07/2015   Lab Results  Component Value Date   TRIG 139 12/16/2017   TRIG 146 10/01/2016   TRIG 136 06/07/2015   Lab Results  Component Value Date   CHOLHDL 4.3 12/16/2017   CHOLHDL 4.9 10/01/2016   CHOLHDL 5.3 (H) 06/07/2015   No results found for: LDLDIRECT Glucose:  Glucose, Bld  Date Value Ref Range Status  12/16/2017 92 65 - 99 mg/dL Final    Comment:    .            Fasting reference interval .   04/09/2017 112 (H) 65 - 99 mg/dL Final  04/08/2017 132 (H) 65 - 99 mg/dL Final   Glucose-Capillary  Date Value Ref Range  Status  04/09/2017 111 (H) 65 - 99 mg/dL Final  04/09/2017 105 (H) 65 - 99 mg/dL Final  04/08/2017 114 (H) 65 - 99 mg/dL Final      Office Visit from 05/20/2018 in [facility]  AUDIT-C Score  0       Married STD testing and prevention (HIV/chl/gon/syphilis): N/A Hep C: up to date   Skin cancer: under the care of dermatologist  Colorectal cancer: up to date  Prostate cancer:  We will check PSA because of BPH symptoms   IPSS Questionnaire (AUA-7): Over the past month.   1)  How often have you had a sensation of not emptying your bladder completely after you finish urinating?  3 - About half the time  2)  How often have you had to urinate again less than two hours after you finished urinating? 1 - Less than 1 time in 5  3)  How often have you found you stopped and started again several times when you urinated?  5 - Almost always  4) How difficult have you found it to postpone urination?  0 - Not at all  5) How often have you had a weak urinary stream?  0 - Not at all  6) How often have you had to push or strain to begin urination?  1 - Less than 1 time in 5  7) How many times did you most typically get up to urinate from the time you went to bed until the time you got up in the morning?  0 - None  Total score:  0-7 mildly symptomatic   8-19 moderately symptomatic   20-35 severely symptomatic   Lung cancer:   Low Dose CT Chest recommended if Age 10-80 years, 30 pack-year currently smoking OR have quit w/in 15years. Patient does qualify.   He refuses to have it done AAA: CT done in 2012 , negative for aneurysm  ECG:  Sees cardiologist   Advanced Care Planning: A voluntary discussion about advance care planning including the explanation and discussion of advance directives.  Discussed health care proxy and Living will, and the patient was able to identify a health care proxy as   Patient does not have a living will at present time.   Patient Active Problem List    Diagnosis Date Noted  . Basal cell carcinoma 03/28/2018  . History of basal cell carcinoma 03/23/2018  . Primary localized osteoarthritis of left  hip 04/06/2017  . Dyslipidemia associated with type 2 diabetes mellitus ([place]) 10/04/2016  . Trigger thumb of both thumbs 06/02/2016  . Chronic tension-type headache, intractable 09/25/2015  . Benign paroxysmal positional vertigo due to bilateral vestibular disorder 09/25/2015  . Hearing loss sensory, bilateral 08/26/2015  . Cerebral microvascular disease 08/26/2015  . Metabolic syndrome 41/63/8453  . Vertigo 06/21/2015  . Buzzing in ear 06/21/2015  . Hypertension goal BP (blood pressure) < 140/90 03/28/2015  . GERD (gastroesophageal reflux disease) 03/28/2015  . Hyperlipidemia 03/28/2015  . Depression, major 03/28/2015  . CAD in native artery 03/28/2015  . Actinic keratosis 02/14/2015  . Obstructive sleep apnea 02/03/2015  . Diverticulosis of colon without hemorrhage 01/31/2015  . Hx of colonic polyps   . Benign neoplasm of descending colon   . Benign neoplasm of sigmoid colon   . Idiopathic colitis   . Sebaceous cyst 09/14/2014    Past Surgical History:  Procedure Laterality Date  . BACK SURGERY    . BASAL CELL CARCINOMA EXCISION Left 12/02/2015   Chest-Done by Dermatologist   . BASAL CELL CARCINOMA EXCISION Left 02/21/2018   Nodulocytic pattern, deep margin involved - [facility] - Dr. Brendolyn Patty  . BASAL CELL CARCINOMA EXCISION  02/23/2018  . CARDIAC CATHETERIZATION Left 04/01/2016   Procedure: Left Heart Cath and Coronary Angiography;  Surgeon: Teodoro Spray, MD;  Location: [place] CV LAB;  Service: Cardiovascular;  Laterality: Left;  . CARDIAC CATHETERIZATION N/A 04/01/2016   Procedure: Intravascular Pressure Wire/FFR Study;  Surgeon: Isaias Cowman, MD;  Location: [place] CV LAB;  Service: Cardiovascular;  Laterality: N/A;  . COLONOSCOPY N/A 01/15/2015   Wohl-ileitis, 2 benign polyps, cryptitis,  sigmoid diverticulosis, focal ulceration ICV  . CORONARY STENT PLACEMENT    . FRACTIONAL FLOW RESERVE WIRE  10/08/2011   Procedure: FRACTIONAL FLOW RESERVE WIRE;  Surgeon: Clent Demark, MD;  Location: [facility] CATH LAB;  Service: Cardiovascular;;  . HERNIA REPAIR    . KNEE SURGERY    . LEFT HEART CATHETERIZATION WITH CORONARY ANGIOGRAM N/A 10/08/2011   Procedure: LEFT HEART CATHETERIZATION WITH CORONARY ANGIOGRAM;  Surgeon: Clent Demark, MD;  Location: [place] CATH LAB;  Service: Cardiovascular;  Laterality: N/A;  . SKIN CANCER EXCISION  01/2018   [facility]  . TOTAL HIP ARTHROPLASTY Left 04/06/2017   Procedure: TOTAL HIP ARTHROPLASTY ANTERIOR APPROACH;  Surgeon: Hessie Knows, MD;  Location: [facility] ORS;  Service: Orthopedics;  Laterality: Left;    Family History  Problem Relation Age of Onset  . Lung cancer Father   . Heart disease Father   . Skin cancer Father     Social History   Socioeconomic History  . Marital status: Married    Spouse name: Thayer Headings  . Number of children: 1  . Years of education: Not on file  . Highest education level: High school graduate  Occupational History  . Not on file  Social Needs  . Financial resource strain: Somewhat hard  . Food insecurity:    Worry: Never true    Inability: Never true  . Transportation needs:    Medical: No    Non-medical: No  Tobacco Use  . Smoking status: Former Smoker    Years: 40.00    Types: Cigarettes    Last attempt to quit: 08/03/2012    Years since quitting: 5.7  . Smokeless tobacco: Never Used  Substance and Sexual Activity  . Alcohol use: No    Alcohol/week: 0.0 standard drinks  . Drug use:  No  . Sexual activity: Yes    Partners: Female    Birth control/protection: None  Lifestyle  . Physical activity:    Days per week: 1 day    Minutes per session: 60 min  . Stress: To some extent  Relationships  . Social connections:    Talks on phone: Once a week    Gets together: Three times a week    Attends  religious service: Never    Active member of club or organization: No    Attends meetings of clubs or organizations: Never    Relationship status: Not on file  . Intimate partner violence:    Fear of current or ex partner: No    Emotionally abused: No    Physically abused: No    Forced sexual activity: No  Other Topics Concern  . Not on file  Social History Narrative   Wife has 2 children by another marriage and he has 1 child by a previous marriage.     Current Outpatient Medications:  .  albuterol (PROVENTIL HFA;VENTOLIN HFA) 108 (90 Base) MCG/ACT inhaler, Inhale 2 puffs into the lungs every 6 (six) hours as needed for wheezing or shortness of breath., Disp: 1 Inhaler, Rfl: 0 .  amLODipine (NORVASC) 2.5 MG tablet, TAKE 1 TABLET BY MOUTH DAILY., Disp: 90 tablet, Rfl: 1 .  aspirin EC 81 MG tablet, Take 81 mg by mouth every morning. , Disp: , Rfl:  .  atorvastatin (LIPITOR) 40 MG tablet, Take 1 tablet (40 mg total) by mouth daily., Disp: 90 tablet, Rfl: 1 .  bisacodyl (DULCOLAX) 10 MG suppository, Place 1 suppository (10 mg total) rectally daily as needed for moderate constipation., Disp: 12 suppository, Rfl: 0 .  buPROPion (WELLBUTRIN XL) 150 MG 24 hr tablet, Take 3 tablets (450 mg total) by mouth daily., Disp: 270 tablet, Rfl: 1 .  docusate sodium (COLACE) 100 MG capsule, Take 1 capsule (100 mg total) by mouth 2 (two) times daily., Disp: 10 capsule, Rfl: 0 .  escitalopram (LEXAPRO) 20 MG tablet, TAKE 1 TABLET BY MOUTH DAILY., Disp: 90 tablet, Rfl: 1 .  fluticasone (FLONASE) 50 MCG/ACT nasal spray, Place 2 sprays into both nostrils daily., Disp: 16 g, Rfl: 0 .  glucose blood (ACCU-CHEK ACTIVE STRIPS) test strip, Use as instructed, Disp: 100 each, Rfl: 12 .  Lancets (ACCU-CHEK SOFT TOUCH) lancets, Use as instructed, Disp: 100 each, Rfl: 12 .  losartan-hydrochlorothiazide (HYZAAR) 100-25 MG tablet, TAKE 1 TABLET BY MOUTH DAILY., Disp: 90 tablet, Rfl: 1 .  metFORMIN (GLUCOPHAGE-XR) 500 MG  24 hr tablet, Take 1 tablet (500 mg total) by mouth daily with breakfast., Disp: 90 tablet, Rfl: 0 .  metoprolol succinate (TOPROL-XL) 50 MG 24 hr tablet, Take 1 tablet (50 mg total) by mouth every evening. Take with or immediately following a meal., Disp: 90 tablet, Rfl: 1 .  NARCAN 4 MG/0.1ML LIQD nasal spray kit, , Disp: , Rfl: 0 .  nitroGLYCERIN (NITROSTAT) 0.4 MG SL tablet, DISSOLVE 1 TABLET UNDER THE TONGUE EVERY 5 MINUTES FOR UP TO 3 DOSES AS NEEDED FOR CHEST PAIN. IF NO RELIEF AFTER 3 DOSES, CALL 911 OR GO TO, Disp: 25 tablet, Rfl: 1 .  nitroGLYCERIN (NITROSTAT) 0.4 MG SL tablet, DISSOLVE 1 TABLET UNDER THE TONGUE EVERY 5 MINUTES FOR UP TO 3 DOSES AS NEEDED FOR CHEST PAIN. IF NO RELIEF AFTER 3 DOSES, CALL 911 OR GO TO, Disp: 25 tablet, Rfl: 0 .  omeprazole (PRILOSEC) 20 MG capsule, TAKE 1 CAPSULE  BY MOUTH DAILY., Disp: 90 capsule, Rfl: 1 .  oxyCODONE (OXY IR/ROXICODONE) 5 MG immediate release tablet, Take 1-2 tablets (5-10 mg total) by mouth every 4 (four) hours as needed for breakthrough pain. (Patient taking differently: Take 10 mg by mouth every 4 (four) hours as needed for breakthrough pain. ), Disp: 30 tablet, Rfl: 0 .  PREVIDENT 5000 SENSITIVE 1.1-5 % PSTE, Apply 1 each topically 2 (two) times daily., Disp: , Rfl: 5  Current Facility-Administered Medications:  .  cyanocobalamin ((VITAMIN B-12)) injection 1,000 mcg, 1,000 mcg, Intramuscular, Q30 days, Steele Sizer, MD, 1,000 mcg at 04/25/18 0920  Allergies  Allergen Reactions  . Elavil [Amitriptyline Hcl] Rash  . Tape Rash    Paper Tape     ROS  Constitutional: Negative for fever or weight change.  Respiratory: Negative for cough and shortness of breath.   Cardiovascular: Negative for chest pain or palpitations.  Gastrointestinal: Negative for abdominal pain, no bowel changes.  Musculoskeletal: Negative for gait problem or joint swelling.  Skin: Negative for rash. still has pruritus on left upper shoulder - where skin  cancer is Neurological: Negative for dizziness or headache.  No other specific complaints in a complete review of systems (except as listed in HPI above).  Objective  Vitals:   05/20/18 0847  BP: 136/64  Pulse: 79  Resp: 16  Temp: 97.9 F (36.6 C)  TempSrc: Oral  SpO2: 95%  Weight: 221 lb 12.8 oz (100.6 kg)  Height: '5\' 7"'  (1.702 m)    Body mass index is 34.74 kg/m.  Physical Exam   Constitutional: Patient appears well-developed and obese . No distress.  HENT: Head: Normocephalic and atraumatic. Ears: B TMs ok, no erythema or effusion; Nose: Nose normal. Mouth/Throat: Oropharynx is clear and moist. No oropharyngeal exudate.  Eyes: Conjunctivae and EOM are normal. Pupils are equal, round, and reactive to light. No scleral icterus.  Neck: Normal range of motion. Neck supple. No JVD present. No thyromegaly present.  Cardiovascular: Normal rate, regular rhythm and normal heart sounds.  No murmur heard. No BLE edema. Pulmonary/Chest: Effort normal and breath sounds normal. No respiratory distress. Abdominal: Soft. Bowel sounds are normal, no distension. There is no tenderness. no masses MALE GENITALIA: he refused exam today  RECTAL: he refused exam today  Musculoskeletal: Normal range of motion, no joint effusions. No gross deformities Neurological: he is alert and oriented to person, place, and time. No cranial nerve deficit. Coordination, balance, strength, speech and gait are normal.  Skin: Skin is warm and dry. Psychiatric: Patient has a normal mood and affect. behavior is normal. Judgment and thought content normal.   PHQ2/9: Depression screen [facility] 2/9 05/20/2018 02/18/2018 12/16/2017 11/25/2017 05/11/2017  Decreased Interest 0 0 0 0 0  Down, Depressed, Hopeless 0 0 0 0 0  PHQ - 2 Score 0 0 0 0 0  Altered sleeping 0 0 - - -  Tired, decreased energy 3 1 - - -  Change in appetite 0 0 - - -  Feeling bad or failure about yourself  0 0 - - -  Trouble concentrating 2 0 - - -   Moving slowly or fidgety/restless 0 0 - - -  Suicidal thoughts 0 0 - - -  PHQ-9 Score 5 - - - -  Difficult doing work/chores Not difficult at all Not difficult at all - - -   Discussed happiness lab podcast with patient   Fall Risk: Fall Risk  05/20/2018 02/18/2018 12/16/2017 11/25/2017 08/18/2017  Falls in  the past year? No No No No No  Number falls in past yr: - - - - -  Injury with Fall? - - - - -  Risk for fall due to : - - - - -  Follow up - - - - -     Functional Status Survey: Is the patient deaf or have difficulty hearing?: No Does the patient have difficulty seeing, even when wearing glasses/contacts?: No Does the patient have difficulty concentrating, remembering, or making decisions?: No Does the patient have difficulty walking or climbing stairs?: Yes(due to lower back pain) Does the patient have difficulty dressing or bathing?: No Does the patient have difficulty doing errands alone such as visiting a doctor's office or shopping?: No   Assessment & Plan  1. Depression, major, recurrent, mild (HCC)  - buPROPion (WELLBUTRIN XL) 150 MG 24 hr tablet; Take 3 tablets (450 mg total) by mouth daily.  Dispense: 270 tablet; Refill: 1  2. Needs flu shot  - Flu vaccine HIGH DOSE PF  3. Mixed hyperlipidemia  - atorvastatin (LIPITOR) 40 MG tablet; Take 1 tablet (40 mg total) by mouth daily.  Dispense: 90 tablet; Refill: 1  4. Dyslipidemia associated with type 2 diabetes mellitus (HCC)  - metFORMIN (GLUCOPHAGE-XR) 500 MG 24 hr tablet; Take 1 tablet (500 mg total) by mouth daily with breakfast.  Dispense: 90 tablet; Refill: 0 - POCT HgB A1C  5. Hypertension, benign  - metoprolol succinate (TOPROL-XL) 50 MG 24 hr tablet; Take 1 tablet (50 mg total) by mouth every evening. Take with or immediately following a meal.  Dispense: 90 tablet; Refill: 1  6. Encounter for routine history and physical exam for male  -Prostate cancer screening and PSA options (with potential risks  and benefits of testing vs not testing) were discussed along with recent recs/guidelines. -USPSTF grade A and B recommendations reviewed with patient; age-appropriate recommendations, preventive care, screening tests, etc discussed and encouraged; healthy living encouraged; see AVS for patient education given to patient -Discussed importance of 150 minutes of physical activity weekly, eat two servings of fish weekly, eat one serving of tree nuts ( cashews, pistachios, pecans, almonds.[name]) every other day, eat 6 servings of fruit/vegetables daily and drink plenty of water and avoid sweet beverages.   7. CAD in native artery  - atorvastatin (LIPITOR) 40 MG tablet; Take 1 tablet (40 mg total) by mouth daily.  Dispense: 90 tablet; Refill: 1 - metoprolol succinate (TOPROL-XL) 50 MG 24 hr tablet; Take 1 tablet (50 mg total) by mouth every evening. Take with or immediately following a meal.  Dispense: 90 tablet; Refill: 1  8. Unstable angina (HCC)  Stable   9. B12 deficiency  - cyanocobalamin ((VITAMIN B-12)) injection 1,000 mcg   10. Basal cell carcinoma (BCC), unspecified site  Seen by Dr. Nicole Kindred and because of margin involvement going to be seen at [facility].   11. BPH with obstruction/lower urinary tract symptoms  - PSA

## 2018-05-21 LAB — HEMOGLOBIN A1C
HEMOGLOBIN A1C: 6.5 %{Hb} — AB (ref <5.7)
MEAN PLASMA GLUCOSE: 140 (calc)
eAG (mmol/L): 7.7 (calc)

## 2018-05-21 LAB — PSA: PSA: 1.5 ng/mL (ref <=4.0)

## 2018-05-23 DIAGNOSIS — C44619 Basal cell carcinoma of skin of left upper limb, including shoulder: Secondary | ICD-10-CM | POA: Diagnosis not present

## 2018-06-03 DIAGNOSIS — M9904 Segmental and somatic dysfunction of sacral region: Secondary | ICD-10-CM | POA: Diagnosis not present

## 2018-06-03 DIAGNOSIS — Z96642 Presence of left artificial hip joint: Secondary | ICD-10-CM | POA: Diagnosis not present

## 2018-06-03 DIAGNOSIS — M545 Low back pain: Secondary | ICD-10-CM | POA: Diagnosis not present

## 2018-06-03 DIAGNOSIS — Z5181 Encounter for therapeutic drug level monitoring: Secondary | ICD-10-CM | POA: Diagnosis not present

## 2018-06-14 ENCOUNTER — Ambulatory Visit: Payer: Medicare Other | Admitting: Family Medicine

## 2018-06-15 NOTE — Telephone Encounter (Signed)
error 

## 2018-06-16 ENCOUNTER — Encounter: Payer: Self-pay | Admitting: Family Medicine

## 2018-06-16 ENCOUNTER — Ambulatory Visit (INDEPENDENT_AMBULATORY_CARE_PROVIDER_SITE_OTHER): Payer: Medicare Other | Admitting: Family Medicine

## 2018-06-16 ENCOUNTER — Ambulatory Visit (INDEPENDENT_AMBULATORY_CARE_PROVIDER_SITE_OTHER): Payer: Medicare Other

## 2018-06-16 VITALS — BP 140/90 | HR 79 | Temp 97.9°F | Resp 16 | Ht 67.0 in | Wt 221.5 lb

## 2018-06-16 VITALS — BP 140/90 | HR 79 | Temp 97.9°F | Resp 16 | Ht 67.0 in | Wt 211.8 lb

## 2018-06-16 DIAGNOSIS — D171 Benign lipomatous neoplasm of skin and subcutaneous tissue of trunk: Secondary | ICD-10-CM | POA: Diagnosis not present

## 2018-06-16 DIAGNOSIS — F330 Major depressive disorder, recurrent, mild: Secondary | ICD-10-CM

## 2018-06-16 DIAGNOSIS — Z85828 Personal history of other malignant neoplasm of skin: Secondary | ICD-10-CM

## 2018-06-16 DIAGNOSIS — Z0000 Encounter for general adult medical examination without abnormal findings: Secondary | ICD-10-CM

## 2018-06-16 DIAGNOSIS — E538 Deficiency of other specified B group vitamins: Secondary | ICD-10-CM | POA: Diagnosis not present

## 2018-06-16 MED ORDER — CYANOCOBALAMIN 1000 MCG/ML IJ SOLN
1000.0000 ug | Freq: Once | INTRAMUSCULAR | Status: AC
  Administered 2018-06-16: 1000 ug via INTRAMUSCULAR

## 2018-06-16 NOTE — Patient Instructions (Signed)
Jimmy Green , Thank you for taking time to come for your Medicare Wellness Visit. I appreciate your ongoing commitment to your health goals. Please review the following plan we discussed and let me know if I can assist you in the future.   Screening recommendations/referrals: Colonoscopy: done 01/15/15 repeat in 2021 Recommended yearly ophthalmology/optometry visit for glaucoma screening and checkup Recommended yearly dental visit for hygiene and checkup  Vaccinations: Influenza vaccine: done 05/20/18 Pneumococcal vaccine: done 04/07/17 Tdap vaccine: done 04/03/14 Shingles vaccine: done 12/16/17    Advanced directives: Advance directive discussed with you today. I have provided a copy for you to complete at home and have notarized. Once this is complete please bring a copy in to our office so we can scan it into your chart.  Conditions/risks identified: recommend increasing physical activity to 30 mins per day 3 days per week  Next appointment: Please follow up in one year for your Medicare Annual Wellness visit.    Preventive Care 33 Years and Older, Male Preventive care refers to lifestyle choices and visits with your health care provider that can promote health and wellness. What does preventive care include?  A yearly physical exam. This is also called an annual well check.  Dental exams once or twice a year.  Routine eye exams. Ask your health care provider how often you should have your eyes checked.  Personal lifestyle choices, including:  Daily care of your teeth and gums.  Regular physical activity.  Eating a healthy diet.  Avoiding tobacco and drug use.  Limiting alcohol use.  Practicing safe sex.  Taking low doses of aspirin every day.  Taking vitamin and mineral supplements as recommended by your health care provider. What happens during an annual well check? The services and screenings done by your health care provider during your annual well check will depend  on your age, overall health, lifestyle risk factors, and family history of disease. Counseling  Your health care provider may ask you questions about your:  Alcohol use.  Tobacco use.  Drug use.  Emotional well-being.  Home and relationship well-being.  Sexual activity.  Eating habits.  History of falls.  Memory and ability to understand (cognition).  Work and work Statistician. Screening  You may have the following tests or measurements:  Height, weight, and BMI.  Blood pressure.  Lipid and cholesterol levels. These may be checked every 5 years, or more frequently if you are over 36 years old.  Skin check.  Lung cancer screening. You may have this screening every year starting at age 63 if you have a 30-pack-year history of smoking and currently smoke or have quit within the past 15 years.  Fecal occult blood test (FOBT) of the stool. You may have this test every year starting at age 46.  Flexible sigmoidoscopy or colonoscopy. You may have a sigmoidoscopy every 5 years or a colonoscopy every 10 years starting at age 25.  Prostate cancer screening. Recommendations will vary depending on your family history and other risks.  Hepatitis C blood test.  Hepatitis B blood test.  Sexually transmitted disease (STD) testing.  Diabetes screening. This is done by checking your blood sugar (glucose) after you have not eaten for a while (fasting). You may have this done every 1-3 years.  Abdominal aortic aneurysm (AAA) screening. You may need this if you are a current or former smoker.  Osteoporosis. You may be screened starting at age 61 if you are at high risk. Talk with your health  care provider about your test results, treatment options, and if necessary, the need for more tests. Vaccines  Your health care provider may recommend certain vaccines, such as:  Influenza vaccine. This is recommended every year.  Tetanus, diphtheria, and acellular pertussis (Tdap, Td)  vaccine. You may need a Td booster every 10 years.  Zoster vaccine. You may need this after age 25.  Pneumococcal 13-valent conjugate (PCV13) vaccine. One dose is recommended after age 103.  Pneumococcal polysaccharide (PPSV23) vaccine. One dose is recommended after age 67. Talk to your health care provider about which screenings and vaccines you need and how often you need them. This information is not intended to replace advice given to you by your health care provider. Make sure you discuss any questions you have with your health care provider. Document Released: 08/16/2015 Document Revised: 04/08/2016 Document Reviewed: 05/21/2015 Elsevier Interactive Patient Education  2017 [place] Prevention in the Home Falls can cause injuries. They can happen to people of all ages. There are many things you can do to make your home safe and to help prevent falls. What can I do on the outside of my home?  Regularly fix the edges of walkways and driveways and fix any cracks.  Remove anything that might make you trip as you walk through a door, such as a raised step or threshold.  Trim any bushes or trees on the path to your home.  Use bright outdoor lighting.  Clear any walking paths of anything that might make someone trip, such as rocks or tools.  Regularly check to see if handrails are loose or broken. Make sure that both sides of any steps have handrails.  Any raised decks and porches should have guardrails on the edges.  Have any leaves, snow, or ice cleared regularly.  Use sand or salt on walking paths during winter.  Clean up any spills in your garage right away. This includes oil or grease spills. What can I do in the bathroom?  Use night lights.  Install grab bars by the toilet and in the tub and shower. Do not use towel bars as grab bars.  Use non-skid mats or decals in the tub or shower.  If you need to sit down in the shower, use a plastic, non-slip  stool.  Keep the floor dry. Clean up any water that spills on the floor as soon as it happens.  Remove soap buildup in the tub or shower regularly.  Attach bath mats securely with double-sided non-slip rug tape.  Do not have throw rugs and other things on the floor that can make you trip. What can I do in the bedroom?  Use night lights.  Make sure that you have a light by your bed that is easy to reach.  Do not use any sheets or blankets that are too big for your bed. They should not hang down onto the floor.  Have a firm chair that has side arms. You can use this for support while you get dressed.  Do not have throw rugs and other things on the floor that can make you trip. What can I do in the kitchen?  Clean up any spills right away.  Avoid walking on wet floors.  Keep items that you use a lot in easy-to-reach places.  If you need to reach something above you, use a strong step stool that has a grab bar.  Keep electrical cords out of the way.  Do not use floor  polish or wax that makes floors slippery. If you must use wax, use non-skid floor wax.  Do not have throw rugs and other things on the floor that can make you trip. What can I do with my stairs?  Do not leave any items on the stairs.  Make sure that there are handrails on both sides of the stairs and use them. Fix handrails that are broken or loose. Make sure that handrails are as long as the stairways.  Check any carpeting to make sure that it is firmly attached to the stairs. Fix any carpet that is loose or worn.  Avoid having throw rugs at the top or bottom of the stairs. If you do have throw rugs, attach them to the floor with carpet tape.  Make sure that you have a light switch at the top of the stairs and the bottom of the stairs. If you do not have them, ask someone to add them for you. What else can I do to help prevent falls?  Wear shoes that:  Do not have high heels.  Have rubber bottoms.  Are  comfortable and fit you well.  Are closed at the toe. Do not wear sandals.  If you use a stepladder:  Make sure that it is fully opened. Do not climb a closed stepladder.  Make sure that both sides of the stepladder are locked into place.  Ask someone to hold it for you, if possible.  Clearly mark and make sure that you can see:  Any grab bars or handrails.  First and last steps.  Where the edge of each step is.  Use tools that help you move around (mobility aids) if they are needed. These include:  Canes.  Walkers.  Scooters.  Crutches.  Turn on the lights when you go into a dark area. Replace any light bulbs as soon as they burn out.  Set up your furniture so you have a clear path. Avoid moving your furniture around.  If any of your floors are uneven, fix them.  If there are any pets around you, be aware of where they are.  Review your medicines with your doctor. Some medicines can make you feel dizzy. This can increase your chance of falling. Ask your doctor what other things that you can do to help prevent falls. This information is not intended to replace advice given to you by your health care provider. Make sure you discuss any questions you have with your health care provider. Document Released: 05/16/2009 Document Revised: 12/26/2015 Document Reviewed: 08/24/2014 Elsevier Interactive Patient Education  2017 [employer].

## 2018-06-16 NOTE — Progress Notes (Signed)
Name: Jimmy Green   MRN: [id number]    DOB: 28-Sep-1950   Date:06/16/2018       Progress Note  Subjective  Chief Complaint  Chief Complaint  Patient presents with  . Depression    HPI  Depression Mild Recurrent: he is on lexapro and wellbutrin XL 450 mg. He denies side effects. His PhQ9 is higher than last month at 7, but he states motivation is better, he remodeled his bathroom this past week. His concentration has improved, but he is feeling more dow. Discussed referral again but he refuses. Denies suicidal thoughts or ideation  Basal Cell: he was very worried waiting for the results , but happy that no need for ration or chemotherapy. Excisional biopsy was done at [facility], he has follow up with dermatologist in town in January, he has a growth on right shoulder    Patient Active Problem List   Diagnosis Date Noted  . BPH with obstruction/lower urinary tract symptoms 05/20/2018  . Basal cell carcinoma 03/28/2018  . History of basal cell carcinoma 03/23/2018  . Primary localized osteoarthritis of left hip 04/06/2017  . Dyslipidemia associated with type 2 diabetes mellitus ([place]) 10/04/2016  . Trigger thumb of both thumbs 06/02/2016  . Chronic tension-type headache, intractable 09/25/2015  . Benign paroxysmal positional vertigo due to bilateral vestibular disorder 09/25/2015  . Hearing loss sensory, bilateral 08/26/2015  . Cerebral microvascular disease 08/26/2015  . Metabolic syndrome 50/93/2671  . Vertigo 06/21/2015  . Buzzing in ear 06/21/2015  . Hypertension goal BP (blood pressure) < 140/90 03/28/2015  . GERD (gastroesophageal reflux disease) 03/28/2015  . Hyperlipidemia 03/28/2015  . Depression, major 03/28/2015  . CAD in native artery 03/28/2015  . Actinic keratosis 02/14/2015  . Obstructive sleep apnea 02/03/2015  . Diverticulosis of colon without hemorrhage 01/31/2015  . Hx of colonic polyps   . Benign neoplasm of descending colon   . Benign neoplasm of sigmoid  colon   . Idiopathic colitis   . Sebaceous cyst 09/14/2014    Past Surgical History:  Procedure Laterality Date  . BACK SURGERY    . BASAL CELL CARCINOMA EXCISION Left 12/02/2015   Chest-Done by Dermatologist   . BASAL CELL CARCINOMA EXCISION Left 02/21/2018   Nodulocytic pattern, deep margin involved - [facility] - Dr. Brendolyn Patty  . BASAL CELL CARCINOMA EXCISION  02/23/2018  . CARDIAC CATHETERIZATION Left 04/01/2016   Procedure: Left Heart Cath and Coronary Angiography;  Surgeon: Teodoro Spray, MD;  Location: [place] CV LAB;  Service: Cardiovascular;  Laterality: Left;  . CARDIAC CATHETERIZATION N/A 04/01/2016   Procedure: Intravascular Pressure Wire/FFR Study;  Surgeon: Isaias Cowman, MD;  Location: [place] CV LAB;  Service: Cardiovascular;  Laterality: N/A;  . COLONOSCOPY N/A 01/15/2015   Wohl-ileitis, 2 benign polyps, cryptitis, sigmoid diverticulosis, focal ulceration ICV  . CORONARY STENT PLACEMENT    . FRACTIONAL FLOW RESERVE WIRE  10/08/2011   Procedure: FRACTIONAL FLOW RESERVE WIRE;  Surgeon: Clent Demark, MD;  Location: [facility] CATH LAB;  Service: Cardiovascular;;  . HERNIA REPAIR    . KNEE SURGERY    . LEFT HEART CATHETERIZATION WITH CORONARY ANGIOGRAM N/A 10/08/2011   Procedure: LEFT HEART CATHETERIZATION WITH CORONARY ANGIOGRAM;  Surgeon: Clent Demark, MD;  Location: [place] CATH LAB;  Service: Cardiovascular;  Laterality: N/A;  . SKIN CANCER EXCISION  01/2018   [facility]  . TOTAL HIP ARTHROPLASTY Left 04/06/2017   Procedure: TOTAL HIP ARTHROPLASTY ANTERIOR APPROACH;  Surgeon: Hessie Knows, MD;  Location: [facility] ORS;  Service: Orthopedics;  Laterality: Left;    Family History  Problem Relation Age of Onset  . Lung cancer Father   . Heart disease Father   . Skin cancer Father     Social History   Socioeconomic History  . Marital status: Married    Spouse name: Thayer Headings  . Number of children: 1  . Years of education: Not on file  .  Highest education level: High school graduate  Occupational History  . Not on file  Social Needs  . Financial resource strain: Somewhat hard  . Food insecurity:    Worry: Never true    Inability: Never true  . Transportation needs:    Medical: No    Non-medical: No  Tobacco Use  . Smoking status: Former Smoker    Years: 40.00    Types: Cigarettes    Last attempt to quit: 08/03/2012    Years since quitting: 5.8  . Smokeless tobacco: Never Used  Substance and Sexual Activity  . Alcohol use: No    Alcohol/week: 0.0 standard drinks  . Drug use: No  . Sexual activity: Yes    Partners: Female    Birth control/protection: None  Lifestyle  . Physical activity:    Days per week: 1 day    Minutes per session: 60 min  . Stress: To some extent  Relationships  . Social connections:    Talks on phone: Once a week    Gets together: Three times a week    Attends religious service: Never    Active member of club or organization: No    Attends meetings of clubs or organizations: Never    Relationship status: Not on file  . Intimate partner violence:    Fear of current or ex partner: No    Emotionally abused: No    Physically abused: No    Forced sexual activity: No  Other Topics Concern  . Not on file  Social History Narrative   Wife has 2 children by another marriage and he has 1 child by a previous marriage.     Current Outpatient Medications:  .  albuterol (PROVENTIL HFA;VENTOLIN HFA) 108 (90 Base) MCG/ACT inhaler, Inhale 2 puffs into the lungs every 6 (six) hours as needed for wheezing or shortness of breath., Disp: 1 Inhaler, Rfl: 0 .  amLODipine (NORVASC) 2.5 MG tablet, TAKE 1 TABLET BY MOUTH DAILY., Disp: 90 tablet, Rfl: 1 .  aspirin EC 81 MG tablet, Take 81 mg by mouth every morning. , Disp: , Rfl:  .  atorvastatin (LIPITOR) 40 MG tablet, Take 1 tablet (40 mg total) by mouth daily., Disp: 90 tablet, Rfl: 1 .  bisacodyl (DULCOLAX) 10 MG suppository, Place 1 suppository (10  mg total) rectally daily as needed for moderate constipation., Disp: 12 suppository, Rfl: 0 .  buPROPion (WELLBUTRIN XL) 150 MG 24 hr tablet, Take 3 tablets (450 mg total) by mouth daily., Disp: 270 tablet, Rfl: 1 .  docusate sodium (COLACE) 100 MG capsule, Take 1 capsule (100 mg total) by mouth 2 (two) times daily., Disp: 10 capsule, Rfl: 0 .  escitalopram (LEXAPRO) 20 MG tablet, TAKE 1 TABLET BY MOUTH DAILY., Disp: 90 tablet, Rfl: 1 .  fluticasone (FLONASE) 50 MCG/ACT nasal spray, Place 2 sprays into both nostrils daily., Disp: 16 g, Rfl: 0 .  glucose blood (ACCU-CHEK ACTIVE STRIPS) test strip, Use as instructed, Disp: 100 each, Rfl: 12 .  Lancets (ACCU-CHEK SOFT TOUCH) lancets, Use as instructed, Disp:  100 each, Rfl: 12 .  losartan-hydrochlorothiazide (HYZAAR) 100-25 MG tablet, TAKE 1 TABLET BY MOUTH DAILY., Disp: 90 tablet, Rfl: 1 .  metFORMIN (GLUCOPHAGE-XR) 500 MG 24 hr tablet, Take 1 tablet (500 mg total) by mouth daily with breakfast., Disp: 90 tablet, Rfl: 0 .  metoprolol succinate (TOPROL-XL) 50 MG 24 hr tablet, Take 1 tablet (50 mg total) by mouth every evening. Take with or immediately following a meal., Disp: 90 tablet, Rfl: 1 .  NARCAN 4 MG/0.1ML LIQD nasal spray kit, , Disp: , Rfl: 0 .  nitroGLYCERIN (NITROSTAT) 0.4 MG SL tablet, DISSOLVE 1 TABLET UNDER THE TONGUE EVERY 5 MINUTES FOR UP TO 3 DOSES AS NEEDED FOR CHEST PAIN. IF NO RELIEF AFTER 3 DOSES, CALL 911 OR GO TO, Disp: 25 tablet, Rfl: 1 .  nitroGLYCERIN (NITROSTAT) 0.4 MG SL tablet, DISSOLVE 1 TABLET UNDER THE TONGUE EVERY 5 MINUTES FOR UP TO 3 DOSES AS NEEDED FOR CHEST PAIN. IF NO RELIEF AFTER 3 DOSES, CALL 911 OR GO TO, Disp: 25 tablet, Rfl: 0 .  omeprazole (PRILOSEC) 20 MG capsule, TAKE 1 CAPSULE BY MOUTH DAILY., Disp: 90 capsule, Rfl: 1 .  Oxycodone HCl 10 MG TABS, Take 1 tablet by mouth 5 (five) times daily., Disp: , Rfl: 0 .  PREVIDENT 5000 SENSITIVE 1.1-5 % PSTE, Apply 1 each topically 2 (two) times daily., Disp: , Rfl:  5  Allergies  Allergen Reactions  . Elavil [Amitriptyline Hcl] Rash  . Tape Rash    Paper Tape    I personally reviewed active problem list, medication list, allergies, family history, social history with the patient/caregiver today.   ROS  Constitutional: Negative for fever or weight change.  Respiratory: Negative for cough and shortness of breath.   Cardiovascular: Negative for chest pain or palpitations.  Gastrointestinal: Negative for abdominal pain, no bowel changes.  Musculoskeletal: Negative for gait problem or joint swelling.  Skin: Negative for rash.  Neurological: Negative for dizziness or headache.  No other specific complaints in a complete review of systems (except as listed in HPI above).  Objective  There were no vitals filed for this visit.  There is no height or weight on file to calculate BMI.  Physical Exam  Constitutional: Patient appears well-developed and well-nourished. Obese  No distress.  HEENT: head atraumatic, normocephalic, pupils equal and reactive to light,  neck supple, throat within normal limits Skin: well healed area of recent excisional biopsy , dry spot on right upper shoulder ( needs to see dermatologist) also has a lipoma right upper back  Cardiovascular: Normal rate, regular rhythm and normal heart sounds.  No murmur heard. No BLE edema. Pulmonary/Chest: Effort normal and breath sounds normal. No respiratory distress. Abdominal: Soft.  There is no tenderness. Muscular Skeletal: pain during palpation of lumbar spine Psychiatric: Patient has a normal mood and affect. behavior is normal. Judgment and thought content normal.  Recent Results (from the past 2160 hour(s))  PSA     Status: None   Collection Time: 05/20/18 10:27 AM  Result Value Ref Range   PSA 1.5 < OR = 4.0 ng/mL    Comment: The total PSA value from this assay system is  standardized against the WHO standard. The test  result will be approximately 20% lower when compared   to the equimolar-standardized total PSA (Beckman  Coulter). Comparison of serial PSA results should be  interpreted with this fact in mind. . This test was performed using the Siemens  chemiluminescent method. Values obtained from  different assay methods cannot be used interchangeably. PSA levels, regardless of value, should not be interpreted as absolute evidence of the presence or absence of disease.   Hemoglobin A1c     Status: Abnormal   Collection Time: 05/20/18 10:27 AM  Result Value Ref Range   Hgb A1c MFr Bld 6.5 (H) <5.7 % of total Hgb    Comment: For someone without known diabetes, a hemoglobin A1c value of 6.5% or greater indicates that they may have  diabetes and this should be confirmed with a follow-up  test. . For someone with known diabetes, a value <7% indicates  that their diabetes is well controlled and a value  greater than or equal to 7% indicates suboptimal  control. A1c targets should be individualized based on  duration of diabetes, age, comorbid conditions, and  other considerations. . Currently, no consensus exists regarding use of hemoglobin A1c for diagnosis of diabetes for children. .    Mean Plasma Glucose 140 (calc)   eAG (mmol/L) 7.7 (calc)     PHQ2/9: Depression screen [facility] 2/9 06/16/2018 05/20/2018 02/18/2018 12/16/2017 11/25/2017  Decreased Interest 3 0 0 0 0  Down, Depressed, Hopeless 3 0 0 0 0  PHQ - 2 Score 6 0 0 0 0  Altered sleeping 0 0 0 - -  Tired, decreased energy _0 - -  Change in appetite 0 0 0 - -  Feeling bad or failure about yourself  0 0 0 - -  Trouble concentrating 0 2 0 - -  Moving slowly or fidgety/restless 0 0 0 - -  Suicidal thoughts 0 0 0 - -  PHQ-9 Score 7 5 - - -  Difficult doing work/chores Somewhat difficult Not difficult at all Not difficult at all - -    Fall Risk: Fall Risk  05/20/2018 02/18/2018 12/16/2017 11/25/2017 08/18/2017  Falls in the past year? _1   Number falls in past yr: - - - - -   Injury with Fall? - - - - -  Risk for fall due to : - - - - -  Follow up - - - - -      Assessment & Plan  1. Depression, major, recurrent, mild ([place])  Discussed counseling again, or referral to psychiatrist but he wants to hold off  2. History of basal cell carcinoma  Well healed scar   3. Lipoma of back  Reassurance for now

## 2018-06-16 NOTE — Progress Notes (Signed)
Subjective:   Jimmy Green is a 67 y.o. male who presents for Medicare Annual/Subsequent preventive examination.  Review of Systems:   Cardiac Risk Factors include: advanced age (>63mn, >>78women);diabetes mellitus;dyslipidemia;hypertension;male gender;obesity (BMI >30kg/m2)     Objective:    Vitals: BP 140/90 (BP Location: Right Arm, Patient Position: Sitting, Cuff Size: Normal) Comment: patient states he had not taken BP meds yet today  Pulse 79   Temp 97.9 F (36.6 C) (Oral)   Resp 16   Ht _0  (1.702 m)   Wt 221 lb 8 oz (100.5 kg)   SpO2 95%   BMI 34.69 kg/m   Body mass index is 34.69 kg/m.  Advanced Directives 06/16/2018 05/21/2017 05/11/2017 04/06/2017 03/31/2017 02/01/2017 10/01/2016  Does Patient Have a Medical Advance Directive? _1  No No  Would patient like information on creating a medical advance directive? Yes (MAU/Ambulatory/Procedural Areas - Information given) - - No - Patient declined - - -  Pre-existing out of facility DNR order (yellow form or pink MOST form) - - - - - - -    Tobacco Social History   Tobacco Use  Smoking Status Former Smoker  . Years: 40.00  . Types: Cigarettes  . Last attempt to quit: 08/03/2012  . Years since quitting: 5.8  Smokeless Tobacco Never Used     Counseling given: Not Answered   Clinical Intake:  Pre-visit preparation completed: Yes  Pain : No/denies pain    Nutrition Risk Assessment:  Has the patient had any N/V/D within the last 2 months?  No  Does the patient have any non-healing wounds?  No  Has the patient had any unintentional weight loss or weight gain?  No   Diabetes:  Is the patient diabetic?  Yes  If diabetic, was a CBG obtained today?  No  Did the patient bring in their glucometer from home?  No  How often do you monitor your CBG's? As needed.   Financial Strains and Diabetes Management:  Are you having any financial strains with the device, your supplies or your medication? No .    Does the patient want to be seen by Chronic Care Management for management of their diabetes?  No  Would the patient like to be referred to a Nutritionist or for Diabetic Management?  No  - provided information about lifestyles program and coverage for Medicare participants  Diabetic Exams:  Diabetic Eye Exam: Completed 03/22/17. Overdue for diabetic eye exam. Pt has been advised about the importance in completing this exam and recommended to call and schedule appt, no referral needed.  Diabetic Foot Exam: Completed 12/16/17.  Nutritional Status: BMI > 30  Obese Nutritional Risks: None Diabetes: Yes CBG done?: No Did pt. bring in CBG monitor from home?: No  How often do you need to have someone help you when you read instructions, pamphlets, or other written materials from your doctor or pharmacy?: 1 - Never What is the last grade level you completed in school?: 12th grade  Interpreter Needed?: No  Information entered by :: KClemetine MarkerLPN  Past Medical History:  Diagnosis Date  . Anginal pain ([place]   . Basal cell carcinoma   . Coronary artery disease   . Depression   . Diabetes mellitus without complication ([place]   . GERD (gastroesophageal reflux disease)   . Hyperlipidemia   . Hypertension   . Hypogonadism in male   . Metabolic syndrome   . Myocardial infarction ([facility]   .  Orthopnea   . Seborrheic keratosis   . Skin cancer 2014   nose and right knee  . Sleep apnea   . 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 BACK SURGERY    . BASAL CELL CARCINOMA EXCISION Left 12/02/2015   Chest-Done by Dermatologist   . BASAL CELL CARCINOMA EXCISION Left 02/21/2018   Nodulocytic pattern, deep margin involved - [facility] - Dr. Brendolyn Patty  . BASAL CELL CARCINOMA EXCISION  02/23/2018  . CARDIAC CATHETERIZATION Left 04/01/2016   Procedure: Left Heart Cath and Coronary Angiography;   Surgeon: Teodoro Spray, MD;  Location: [facility];  Service: Cardiovascular;  Laterality: Left;  . CARDIAC CATHETERIZATION N/A 04/01/2016   Procedure: Intravascular Pressure Wire/FFR Study;  Surgeon: Isaias Cowman, MD;  Location: [facility];  Service: Cardiovascular;  Laterality: N/A;  . COLONOSCOPY N/A 01/15/2015   Wohl-ileitis, 2 benign polyps, cryptitis, sigmoid diverticulosis, focal ulceration ICV  . CORONARY STENT PLACEMENT    . FRACTIONAL FLOW RESERVE WIRE  10/08/2011   Procedure: FRACTIONAL FLOW RESERVE WIRE;  Surgeon: Clent Demark, MD;  Location: [facility] CATH LAB;  Service: Cardiovascular;;  . HERNIA REPAIR    . KNEE SURGERY    . LEFT HEART CATHETERIZATION WITH CORONARY ANGIOGRAM N/A 10/08/2011   Procedure: LEFT HEART CATHETERIZATION WITH CORONARY ANGIOGRAM;  Surgeon: Clent Demark, MD;  Location: [facility];  Service: Cardiovascular;  Laterality: N/A;  . SKIN CANCER EXCISION  01/2018   [facility]  . TOTAL HIP ARTHROPLASTY Left 04/06/2017   Procedure: TOTAL HIP ARTHROPLASTY ANTERIOR APPROACH;  Surgeon: Hessie Knows, MD;  Location: [facility] ORS;  Service: Orthopedics;  Laterality: Left;   Family History  Problem Relation Age of Onset  . Lung cancer Father   . Heart disease Father   . Skin cancer Father    Social History   Socioeconomic History  . Marital status: Married    Spouse name: Thayer Headings  . Number of children: 1  . Years of education: Not on file  . Highest education level: High school graduate  Occupational History  . Occupation: retired  Scientific laboratory technician  . Financial resource strain: Not hard at all  . Food insecurity:    Worry: Never true    Inability: Never true  . Transportation needs:    Medical: No    Non-medical: No  Tobacco Use  . Smoking status: Former Smoker    Years: 40.00    Types: Cigarettes    Last attempt to quit: 08/03/2012    Years since quitting: 5.8  . Smokeless tobacco: Never Used  Substance and Sexual Activity  .  Alcohol use: No    Alcohol/week: 0.0 standard drinks  . Drug use: No  . Sexual activity: Yes    Partners: Female    Birth control/protection: None  Lifestyle  . Physical activity:    Days per week: 0 days    Minutes per session: 0 min  . Stress: To some extent  Relationships  . Social connections:    Talks on phone: Once a week    Gets together: Three times a week    Attends religious service: Never    Active member of club or organization: No    Attends meetings of clubs or organizations: Never    Relationship status: Not on file  Other Topics Concern  . Not on file  Social History  Narrative   Wife has 2 children by another marriage and he has 1 child by a previous marriage.    Outpatient Encounter Medications as of 06/16/2018  Medication Sig  . albuterol (PROVENTIL HFA;VENTOLIN HFA) 108 (90 Base) MCG/ACT inhaler Inhale 2 puffs into the lungs every 6 (six) hours as needed for wheezing or shortness of breath.  [name] amLODipine (NORVASC) 2.5 MG tablet TAKE 1 TABLET BY MOUTH DAILY.  [name] aspirin EC 81 MG tablet Take 81 mg by mouth every morning.   [name] atorvastatin (LIPITOR) 40 MG tablet Take 1 tablet (40 mg total) by mouth daily.  . bisacodyl (DULCOLAX) 10 MG suppository Place 1 suppository (10 mg total) rectally daily as needed for moderate constipation.  [name] buPROPion (WELLBUTRIN XL) 150 MG 24 hr tablet Take 3 tablets (450 mg total) by mouth daily.  [name] docusate sodium (COLACE) 100 MG capsule Take 1 capsule (100 mg total) by mouth 2 (two) times daily.  [name] escitalopram (LEXAPRO) 20 MG tablet TAKE 1 TABLET BY MOUTH DAILY.  . fluticasone (FLONASE) 50 MCG/ACT nasal spray Place 2 sprays into both nostrils daily.  [name] glucose blood (ACCU-CHEK ACTIVE STRIPS) test strip Use as instructed  . Lancets (ACCU-CHEK SOFT TOUCH) lancets Use as instructed  . losartan-hydrochlorothiazide (HYZAAR) 100-25 MG tablet TAKE 1 TABLET BY MOUTH DAILY.  . metFORMIN (GLUCOPHAGE-XR) 500 MG 24 hr tablet Take 1 tablet (500  mg total) by mouth daily with breakfast.  . metoprolol succinate (TOPROL-XL) 50 MG 24 hr tablet Take 1 tablet (50 mg total) by mouth every evening. Take with or immediately following a meal.  . NARCAN 4 MG/0.1ML LIQD nasal spray kit   . nitroGLYCERIN (NITROSTAT) 0.4 MG SL tablet DISSOLVE 1 TABLET UNDER THE TONGUE EVERY 5 MINUTES FOR UP TO 3 DOSES AS NEEDED FOR CHEST PAIN. IF NO RELIEF AFTER 3 DOSES, CALL 911 OR GO TO  . nitroGLYCERIN (NITROSTAT) 0.4 MG SL tablet DISSOLVE 1 TABLET UNDER THE TONGUE EVERY 5 MINUTES FOR UP TO 3 DOSES AS NEEDED FOR CHEST PAIN. IF NO RELIEF AFTER 3 DOSES, CALL 911 OR GO TO  . omeprazole (PRILOSEC) 20 MG capsule TAKE 1 CAPSULE BY MOUTH DAILY.  [name] Oxycodone HCl 10 MG TABS Take 1 tablet by mouth 5 (five) times daily.  [name] PREVIDENT 5000 SENSITIVE 1.1-5 % PSTE Apply 1 each topically 2 (two) times daily.  . [EXPIRED] cyanocobalamin ((VITAMIN B-12)) injection 1,000 mcg    No facility-administered encounter medications on file as of 06/16/2018.     Activities of Daily Living In your present state of health, do you have any difficulty performing the following activities: 06/16/2018 05/20/2018  Hearing? N N  Comment declines hearing aids -  Vision? N N  Comment wears reading glasses -  Difficulty concentrating or making decisions? N N  Walking or climbing stairs? N Y  Comment - due to lower back pain  Dressing or bathing? N N  Doing errands, shopping? N N  Preparing Food and eating ? N -  Using the Toilet? N -  In the past six months, have you accidently leaked urine? N -  Do you have problems with loss of bowel control? N -  Managing your Medications? N -  Managing your Finances? N -  Housekeeping or managing your Housekeeping? N -  Some recent data might be hidden    Patient Care Team: Steele Sizer, MD as PCP - General (Family Medicine) Steele Sizer, MD as Attending Physician (Family Medicine) Bary Castilla Forest Gleason, MD (General  Surgery) Hessie Knows, MD as  Consulting Physician (Orthopedic Surgery) Teodoro Spray, MD as Consulting Physician (Cardiology) Mohammed Kindle, MD as Referring Physician (Pain Medicine) Brendolyn Patty, MD (Dermatology)   Assessment:   This is a routine wellness examination for Jimmy Green.  Exercise Activities and Dietary recommendations Current Exercise Habits: The patient does not participate in regular exercise at present(plans to join [facility] silver sneakers), Exercise limited by: cardiac condition(s);orthopedic condition(s)(hip replacement 04/2017)  Goals    . Increase physical activity     Recommend increase moderate physical activity to 30 minutes per day 3 days per week. Pt states he plans to join silver sneakers program at [facility]  06/16/2018 05/20/2018 02/18/2018 12/16/2017 11/25/2017  Falls in the past year? 0 No No No No  Number falls in past yr: 0 - - - -  Injury with Fall? - - - - -  Risk for fall due to : - - - - -  Follow up - - - - -   [place]:  Any stairs in or around the home WITH handrails? No  Home free of loose throw rugs in walkways, pet beds, electrical cords, etc? Yes  Adequate lighting in your home to reduce risk of falls? Yes   ASSISTIVE DEVICES UTILIZED TO PREVENT FALLS:  Life alert? No  Use of a cane, walker or w/c? Cane used after hip replacement Grab bars in the bathroom? Yes  Shower chair or bench in shower? No  Elevated toilet seat or a handicapped toilet? No   DME ORDERS:  DME order needed?  No   TIMED UP AND GO:  Was the test performed? Yes .  Length of time to ambulate 10 feet: 6 sec.   GAIT:  Appearance of gait: Gait stead-fast and without the use of an assistive device.  Education: Fall risk prevention has been discussed.  Intervention(s) required? No    Depression Screen PHQ 2/9 Scores 06/16/2018 06/16/2018 05/20/2018 02/18/2018  PHQ - 2 Score 6 6 0 0  PHQ- 9 Score _0 -    Cognitive Function  pt  declined 6CIT today stating he feels his memory is fine      Immunization History  Administered Date(s) Administered  . Influenza, High Dose Seasonal PF 05/11/2017, 05/20/2018  . Influenza,inj,Quad PF,6+ Mos 03/28/2015, 05/11/2016  . Influenza-Unspecified 04/03/2014  . Pneumococcal Conjugate-13 04/03/2014  . Pneumococcal Polysaccharide-23 04/07/2017  . Tdap 04/03/2014  . Zoster 06/18/2014  . Zoster Recombinat (Shingrix) 12/16/2017    Qualifies for Shingles Vaccine? Yes  Zostavax completed 06/18/14. Due for second Shingrix dose however patient had severe reaction to initial dose given 12/16/17. Pt discussed with Dr. Ancil Boozer today and will not be receiving second dose.   Tdap: Up to date  Flu Vaccine: Up to date  Pneumococcal Vaccine: Up to date   Screening Tests Health Maintenance  Topic Date Due  . OPHTHALMOLOGY EXAM  03/22/2018  . HEMOGLOBIN A1C  11/19/2018  . FOOT EXAM  12/17/2018  . COLONOSCOPY  01/15/2020  . TETANUS/TDAP  04/03/2024  . INFLUENZA VACCINE  Completed  . Hepatitis C Screening  Completed  . PNA vac Low Risk Adult  Completed     Cancer Screenings:  Colorectal Screening: Completed 01/15/15. Repeat every 5 years;   Lung Cancer Screening: (Low Dose CT Chest recommended if Age 62-80 years, 30 pack-year currently smoking OR have quit w/in 15years.) does not qualify.  Additional Screening:  Hepatitis C Screening: does qualify; Completed 06/07/15  Vision Screening: Recommended annual ophthalmology exams for early detection of glaucoma and other disorders of the eye. Is the patient up to date with their annual eye exam?  No  - Advised to schedule appt. Who is the provider or what is the name of the office in which the pt attends annual eye exams? [facility] Screening: Recommended annual dental exams for proper oral hygiene  Community Resource Referral:  CRR required this visit?  No   Plan:    I have personally reviewed and addressed the  Medicare Annual Wellness questionnaire and have noted the following in the patient's chart:  A. Medical and social history B. Use of alcohol, tobacco or illicit drugs  C. Current medications and supplements D. Functional ability and status E.  Nutritional status F.  Physical activity G. Advance directives H. List of other physicians I.  Hospitalizations, surgeries, and ER visits in previous 12 months J.  [place] such as hearing and vision if needed, cognitive and depression L. Referrals and appointments   In addition, I have reviewed and discussed with patient certain preventive protocols, quality metrics, and best practice recommendations. A written personalized care plan for preventive services as well as general preventive health recommendations were provided to patient.   Signed,  Clemetine Marker, LPN Nurse Health Advisor   Nurse Notes: patient's B12 injection administered during today's visit also. Pt states he is doing well and appreciative of visit.

## 2018-07-11 DIAGNOSIS — Z96642 Presence of left artificial hip joint: Secondary | ICD-10-CM | POA: Diagnosis not present

## 2018-07-11 DIAGNOSIS — Z5181 Encounter for therapeutic drug level monitoring: Secondary | ICD-10-CM | POA: Diagnosis not present

## 2018-07-11 DIAGNOSIS — M9904 Segmental and somatic dysfunction of sacral region: Secondary | ICD-10-CM | POA: Diagnosis not present

## 2018-07-11 DIAGNOSIS — M545 Low back pain: Secondary | ICD-10-CM | POA: Diagnosis not present

## 2018-07-20 ENCOUNTER — Ambulatory Visit (INDEPENDENT_AMBULATORY_CARE_PROVIDER_SITE_OTHER): Payer: Medicare Other

## 2018-07-20 DIAGNOSIS — E538 Deficiency of other specified B group vitamins: Secondary | ICD-10-CM

## 2018-07-20 MED ORDER — CYANOCOBALAMIN 1000 MCG/ML IJ SOLN
1000.0000 ug | Freq: Once | INTRAMUSCULAR | Status: AC
  Administered 2018-07-20: 1000 ug via INTRAMUSCULAR

## 2018-07-20 NOTE — Progress Notes (Signed)
Patient came in for her B-12 injection. He tolerated it well. NKDA.   

## 2018-07-28 DIAGNOSIS — Z96642 Presence of left artificial hip joint: Secondary | ICD-10-CM | POA: Diagnosis not present

## 2018-07-28 DIAGNOSIS — S76012A Strain of muscle, fascia and tendon of left hip, initial encounter: Secondary | ICD-10-CM | POA: Diagnosis not present

## 2018-08-08 DIAGNOSIS — M9904 Segmental and somatic dysfunction of sacral region: Secondary | ICD-10-CM | POA: Diagnosis not present

## 2018-08-08 DIAGNOSIS — Z96642 Presence of left artificial hip joint: Secondary | ICD-10-CM | POA: Diagnosis not present

## 2018-08-08 DIAGNOSIS — M545 Low back pain: Secondary | ICD-10-CM | POA: Diagnosis not present

## 2018-08-08 DIAGNOSIS — Z5181 Encounter for therapeutic drug level monitoring: Secondary | ICD-10-CM | POA: Diagnosis not present

## 2018-08-16 ENCOUNTER — Ambulatory Visit (INDEPENDENT_AMBULATORY_CARE_PROVIDER_SITE_OTHER): Payer: Medicare Other

## 2018-08-16 DIAGNOSIS — E538 Deficiency of other specified B group vitamins: Secondary | ICD-10-CM

## 2018-08-16 MED ORDER — CYANOCOBALAMIN 1000 MCG/ML IJ SOLN
1000.0000 ug | Freq: Once | INTRAMUSCULAR | Status: AC
  Administered 2018-08-16: 1000 ug via INTRAMUSCULAR

## 2018-08-17 ENCOUNTER — Ambulatory Visit: Payer: Medicare Other

## 2018-08-30 DIAGNOSIS — Z1283 Encounter for screening for malignant neoplasm of skin: Secondary | ICD-10-CM | POA: Diagnosis not present

## 2018-08-30 DIAGNOSIS — Z85828 Personal history of other malignant neoplasm of skin: Secondary | ICD-10-CM | POA: Diagnosis not present

## 2018-08-30 DIAGNOSIS — L918 Other hypertrophic disorders of the skin: Secondary | ICD-10-CM | POA: Diagnosis not present

## 2018-08-30 DIAGNOSIS — L821 Other seborrheic keratosis: Secondary | ICD-10-CM | POA: Diagnosis not present

## 2018-08-30 DIAGNOSIS — D179 Benign lipomatous neoplasm, unspecified: Secondary | ICD-10-CM | POA: Diagnosis not present

## 2018-09-05 DIAGNOSIS — M545 Low back pain: Secondary | ICD-10-CM | POA: Diagnosis not present

## 2018-09-05 DIAGNOSIS — Z5181 Encounter for therapeutic drug level monitoring: Secondary | ICD-10-CM | POA: Diagnosis not present

## 2018-09-05 DIAGNOSIS — M9904 Segmental and somatic dysfunction of sacral region: Secondary | ICD-10-CM | POA: Diagnosis not present

## 2018-09-05 DIAGNOSIS — Z96642 Presence of left artificial hip joint: Secondary | ICD-10-CM | POA: Diagnosis not present

## 2018-09-16 ENCOUNTER — Ambulatory Visit
Admission: RE | Admit: 2018-09-16 | Discharge: 2018-09-16 | Disposition: A | Discharge status: Home / Self Care | Payer: Medicare Other | Attending: Family Medicine | Admitting: Family Medicine

## 2018-09-16 ENCOUNTER — Ambulatory Visit (INDEPENDENT_AMBULATORY_CARE_PROVIDER_SITE_OTHER): Payer: Medicare Other | Admitting: Family Medicine

## 2018-09-16 ENCOUNTER — Encounter: Payer: Self-pay | Admitting: Family Medicine

## 2018-09-16 ENCOUNTER — Ambulatory Visit
Admission: RE | Admit: 2018-09-16 | Discharge: 2018-09-16 | Disposition: A | Discharge status: Home / Self Care | Payer: Medicare Other | Source: Ambulatory Visit | Attending: Family Medicine | Admitting: Family Medicine

## 2018-09-16 ENCOUNTER — Telehealth: Payer: Self-pay | Admitting: Family Medicine

## 2018-09-16 VITALS — BP 128/74 | HR 70 | Temp 98.1°F | Resp 16 | Ht 67.0 in | Wt 223.3 lb

## 2018-09-16 DIAGNOSIS — I10 Essential (primary) hypertension: Secondary | ICD-10-CM | POA: Diagnosis not present

## 2018-09-16 DIAGNOSIS — E538 Deficiency of other specified B group vitamins: Secondary | ICD-10-CM

## 2018-09-16 DIAGNOSIS — E782 Mixed hyperlipidemia: Secondary | ICD-10-CM

## 2018-09-16 DIAGNOSIS — E1169 Type 2 diabetes mellitus with other specified complication: Secondary | ICD-10-CM

## 2018-09-16 DIAGNOSIS — F330 Major depressive disorder, recurrent, mild: Secondary | ICD-10-CM

## 2018-09-16 DIAGNOSIS — R05 Cough: Secondary | ICD-10-CM | POA: Diagnosis not present

## 2018-09-16 DIAGNOSIS — K219 Gastro-esophageal reflux disease without esophagitis: Secondary | ICD-10-CM

## 2018-09-16 DIAGNOSIS — I2510 Atherosclerotic heart disease of native coronary artery without angina pectoris: Secondary | ICD-10-CM

## 2018-09-16 DIAGNOSIS — E785 Hyperlipidemia, unspecified: Secondary | ICD-10-CM

## 2018-09-16 DIAGNOSIS — R059 Cough, unspecified: Secondary | ICD-10-CM

## 2018-09-16 DIAGNOSIS — J398 Other specified diseases of upper respiratory tract: Secondary | ICD-10-CM

## 2018-09-16 DIAGNOSIS — R1011 Right upper quadrant pain: Secondary | ICD-10-CM

## 2018-09-16 DIAGNOSIS — R0602 Shortness of breath: Secondary | ICD-10-CM | POA: Diagnosis not present

## 2018-09-16 DIAGNOSIS — R9389 Abnormal findings on diagnostic imaging of other specified body structures: Secondary | ICD-10-CM

## 2018-09-16 LAB — POCT GLYCOSYLATED HEMOGLOBIN (HGB A1C): HbA1c, POC (controlled diabetic range): 6.2 % (ref 0.0–7.0)

## 2018-09-16 MED ORDER — CYANOCOBALAMIN 1000 MCG/ML IJ SOLN
1000.0000 ug | Freq: Once | INTRAMUSCULAR | Status: AC
  Administered 2018-09-16: 1000 ug via INTRAMUSCULAR

## 2018-09-16 MED ORDER — OMEPRAZOLE 20 MG PO CPDR: 20.0000 mg | DELAYED_RELEASE_CAPSULE | Freq: Every day | ORAL | 1 refills | Status: DC

## 2018-09-16 MED ORDER — BENZONATATE 100 MG PO CAPS: 100.0000 mg | ORAL_CAPSULE | Freq: Two times a day (BID) | ORAL | 0 refills | Status: DC | PRN

## 2018-09-16 MED ORDER — AMLODIPINE BESYLATE 2.5 MG PO TABS: 2.5000 mg | ORAL_TABLET | Freq: Every day | ORAL | 1 refills | Status: DC

## 2018-09-16 MED ORDER — RANITIDINE HCL 150 MG PO TABS: 150.0000 mg | ORAL_TABLET | Freq: Every day | ORAL | 0 refills | Status: DC

## 2018-09-16 MED ORDER — NITROGLYCERIN 0.4 MG SL SUBL: SUBLINGUAL_TABLET | SUBLINGUAL | 0 refills | Status: DC

## 2018-09-16 MED ORDER — ESCITALOPRAM OXALATE 20 MG PO TABS: 20.0000 mg | ORAL_TABLET | Freq: Every day | ORAL | 1 refills | Status: DC

## 2018-09-16 MED ORDER — ATORVASTATIN CALCIUM 40 MG PO TABS: 40.0000 mg | ORAL_TABLET | Freq: Every day | ORAL | 1 refills | Status: DC

## 2018-09-16 MED ORDER — BUPROPION HCL ER (XL) 150 MG PO TB24: 450.0000 mg | ORAL_TABLET | Freq: Every day | ORAL | 1 refills | Status: DC

## 2018-09-16 MED ORDER — METFORMIN HCL ER 500 MG PO TB24: 500.0000 mg | ORAL_TABLET | Freq: Every day | ORAL | 1 refills | Status: DC

## 2018-09-16 MED ORDER — METOPROLOL SUCCINATE ER 50 MG PO TB24: 50.0000 mg | ORAL_TABLET | Freq: Every evening | ORAL | 1 refills | Status: DC

## 2018-09-16 NOTE — Patient Instructions (Signed)

## 2018-09-16 NOTE — Progress Notes (Signed)
Name: Jimmy Green   MRN: [id number]    DOB: 1951-04-23   Date:09/16/2018       Progress Note  Subjective  Chief Complaint  Chief Complaint  Patient presents with  . Medication Refill    3 month F/U  . Depression  . Dyslipidemia  . Sleep Apnea    Could not tolerate CPAP was giving him headaches  . Coronary Artery Disease  . Hypertension    States his BP is up and down-headaches occasionally  . Diabetes  . Aspiration    Last Sunday, went to bed and starting choking in his sleep-wheezing, SOB, chest tightness.    HPI  Depression Major:taking medication daily, no suicidal thoughts or ideation,he states getting frustrated because always going to the doctor. PHq9 is down to 2 today.   GERD/Cough: he states he has noticed worsening of regurgitation over the past month, chocking and waking up during the night with heartburn and something hot in his mouth. He states symptoms improves if he can cough it up, however 5 days ago he chocked at night and could not spit it out, since than he has noticed a dry cough and mild SOB during a coughing spell, no fever or chills.   RUQ pain: he states symptoms started about one month ago, not associated with meals, tender to touch and when he moves in bed, no redness or bulging. No nausea or vomiting.   Dyslipidemia with type II DM: he denies polyphagia, polydipsia or polyuria. His hgbA1C was 6.7% diagnosed March 2018,hgbA1C down to 6.2 % . He has changed his diet. . He has been taking aspirin daily, ARBand statin therapy.Eye exam is due.  Taking metforminas recommended, denies side effects.   HTN: bp is at goal , she has been taking medication daily.BP not being checked at home lately. He states at the pain clinic it is mostly good   Hyperlipidemia: taking Atorvastatin and denies side effects. No myalgia. Reviewed last labs, and ways to increase HDL Recheck next visit    OSA: he got a CPAP mask was not fitting properly and causing  right forehead pain , ordered by Dr. Ubaldo Glassing, he stopped using it. Advised to discuss it with Dr. Ubaldo Glassing during his upcoming appointment   History of basal cell carcinoma: Seeing Dermatologist  , Dr. Nicole Kindred every 6 months   CAD: taking aspirin, statin and beta blocker and ARB,and he has NTG in his pocket, but has not used it yet. . Nochest pain or decrease in exercise tolerance. He was seen by Dr. Ubaldo Glassing in01/2019, and had another heart cath that showed no significant disease, 55-70% stenosis on the LAD , on medical management. He has a follow up next week.   Left hip replacement history : under the care of Dr. Primus Bravo on pain medication and pain today is 0/10, still under the care of pain clinic, symptoms usually present during activity    Patient Active Problem List   Diagnosis Date Noted  . BPH with obstruction/lower urinary tract symptoms 05/20/2018  . Basal cell carcinoma 03/28/2018  . History of basal cell carcinoma 03/23/2018  . Primary localized osteoarthritis of left hip 04/06/2017  . Dyslipidemia associated with type 2 diabetes mellitus ([place]) 10/04/2016  . Trigger thumb of both thumbs 06/02/2016  . Chronic tension-type headache, intractable 09/25/2015  . Benign paroxysmal positional vertigo due to bilateral vestibular disorder 09/25/2015  . Hearing loss sensory, bilateral 08/26/2015  . Cerebral microvascular disease 08/26/2015  . Metabolic syndrome 92/06/9416  .  Vertigo 06/21/2015  . Buzzing in ear 06/21/2015  . Hypertension goal BP (blood pressure) < 140/90 03/28/2015  . GERD (gastroesophageal reflux disease) 03/28/2015  . Hyperlipidemia 03/28/2015  . Depression, major 03/28/2015  . CAD in native artery 03/28/2015  . Actinic keratosis 02/14/2015  . Obstructive sleep apnea 02/03/2015  . Diverticulosis of colon without hemorrhage 01/31/2015  . Hx of colonic polyps   . Benign neoplasm of descending colon   . Benign neoplasm of sigmoid colon   . Idiopathic colitis   .  Sebaceous cyst 09/14/2014    Past Surgical History:  Procedure Laterality Date  . BACK SURGERY    . BASAL CELL CARCINOMA EXCISION Left 12/02/2015   Chest-Done by Dermatologist   . BASAL CELL CARCINOMA EXCISION Left 02/21/2018   Nodulocytic pattern, deep margin involved - [facility] - Dr. Brendolyn Patty  . BASAL CELL CARCINOMA EXCISION  02/23/2018  . CARDIAC CATHETERIZATION Left 04/01/2016   Procedure: Left Heart Cath and Coronary Angiography;  Surgeon: Teodoro Spray, MD;  Location: [place] [facility];  Service: Cardiovascular;  Laterality: Left;  . CARDIAC CATHETERIZATION N/A 04/01/2016   Procedure: Intravascular Pressure Wire/FFR Study;  Surgeon: Isaias Cowman, MD;  Location: [place] [facility];  Service: Cardiovascular;  Laterality: N/A;  . COLONOSCOPY N/A 01/15/2015   Wohl-ileitis, 2 benign polyps, cryptitis, sigmoid diverticulosis, focal ulceration ICV  . CORONARY STENT PLACEMENT    . FRACTIONAL FLOW RESERVE WIRE  10/08/2011   Procedure: FRACTIONAL FLOW RESERVE WIRE;  Surgeon: Clent Demark, MD;  Location: [facility] CATH LAB;  Service: Cardiovascular;;  . HERNIA REPAIR    . KNEE SURGERY    . LEFT HEART CATHETERIZATION WITH CORONARY ANGIOGRAM N/A 10/08/2011   Procedure: LEFT HEART CATHETERIZATION WITH CORONARY ANGIOGRAM;  Surgeon: Clent Demark, MD;  Location: [facility];  Service: Cardiovascular;  Laterality: N/A;  . SKIN CANCER EXCISION  01/2018   [facility]  . TOTAL HIP ARTHROPLASTY Left 04/06/2017   Procedure: TOTAL HIP ARTHROPLASTY ANTERIOR APPROACH;  Surgeon: Hessie Knows, MD;  Location: [facility] ORS;  Service: Orthopedics;  Laterality: Left;    Family History  Problem Relation Age of Onset  . Lung cancer Father   . Heart disease Father   . Skin cancer Father     Social History   Socioeconomic History  . Marital status: Married    Spouse name: Thayer Headings  . Number of children: 1  . Years of education: Not on file  . Highest education level: High school  graduate  Occupational History  . Occupation: retired  Scientific laboratory technician  . Financial resource strain: Not hard at all  . Food insecurity:    Worry: Never true    Inability: Never true  . Transportation needs:    Medical: No    Non-medical: No  Tobacco Use  . Smoking status: Former Smoker    Years: 40.00    Types: Cigarettes    Last attempt to quit: 08/03/2012    Years since quitting: 6.1  . Smokeless tobacco: Never Used  Substance and Sexual Activity  . Alcohol use: No    Alcohol/week: 0.0 standard drinks  . Drug use: No  . Sexual activity: Yes    Partners: Female    Birth control/protection: None  Lifestyle  . Physical activity:    Days per week: 0 days    Minutes per session: 0 min  . Stress: To some extent  Relationships  . Social connections:    Talks on phone: Once  a week    Gets together: Three times a week    Attends religious service: Never    Active member of club or organization: No    Attends meetings of clubs or organizations: Never    Relationship status: Not on file  . Intimate partner violence:    Fear of current or ex partner: No    Emotionally abused: No    Physically abused: No    Forced sexual activity: No  Other Topics Concern  . Not on file  Social History Narrative   Wife has 2 children by another marriage and he has 1 child by a previous marriage.     Current Outpatient Medications:  .  albuterol (PROVENTIL HFA;VENTOLIN HFA) 108 (90 Base) MCG/ACT inhaler, Inhale 2 puffs into the lungs every 6 (six) hours as needed for wheezing or shortness of breath., Disp: 1 Inhaler, Rfl: 0 .  amLODipine (NORVASC) 2.5 MG tablet, TAKE 1 TABLET BY MOUTH DAILY., Disp: 90 tablet, Rfl: 1 .  aspirin EC 81 MG tablet, Take 81 mg by mouth every morning. , Disp: , Rfl:  .  atorvastatin (LIPITOR) 40 MG tablet, Take 1 tablet (40 mg total) by mouth daily., Disp: 90 tablet, Rfl: 1 .  bisacodyl (DULCOLAX) 10 MG suppository, Place 1 suppository (10 mg total) rectally daily as  needed for moderate constipation., Disp: 12 suppository, Rfl: 0 .  buPROPion (WELLBUTRIN XL) 150 MG 24 hr tablet, Take 3 tablets (450 mg total) by mouth daily., Disp: 270 tablet, Rfl: 1 .  diclofenac sodium (VOLTAREN) 1 % GEL, Apply topically., Disp: , Rfl:  .  docusate sodium (COLACE) 100 MG capsule, Take 1 capsule (100 mg total) by mouth 2 (two) times daily., Disp: 10 capsule, Rfl: 0 .  escitalopram (LEXAPRO) 20 MG tablet, TAKE 1 TABLET BY MOUTH DAILY., Disp: 90 tablet, Rfl: 1 .  fluticasone (FLONASE) 50 MCG/ACT nasal spray, Place 2 sprays into both nostrils daily., Disp: 16 g, Rfl: 0 .  glucose blood (ACCU-CHEK ACTIVE STRIPS) test strip, Use as instructed, Disp: 100 each, Rfl: 12 .  hydrochlorothiazide (HYDRODIURIL) 25 MG tablet, , Disp: , Rfl:  .  Lancets (ACCU-CHEK SOFT TOUCH) lancets, Use as instructed, Disp: 100 each, Rfl: 12 .  losartan (COZAAR) 100 MG tablet, , Disp: , Rfl:  .  metFORMIN (GLUCOPHAGE-XR) 500 MG 24 hr tablet, Take 1 tablet (500 mg total) by mouth daily with breakfast., Disp: 90 tablet, Rfl: 0 .  metoprolol succinate (TOPROL-XL) 50 MG 24 hr tablet, Take 1 tablet (50 mg total) by mouth every evening. Take with or immediately following a meal., Disp: 90 tablet, Rfl: 1 .  metroNIDAZOLE (METROGEL) 0.75 % gel, , Disp: , Rfl:  .  NARCAN 4 MG/0.1ML LIQD nasal spray kit, , Disp: , Rfl: 0 .  omeprazole (PRILOSEC) 20 MG capsule, TAKE 1 CAPSULE BY MOUTH DAILY., Disp: 90 capsule, Rfl: 1 .  Oxycodone HCl 10 MG TABS, Take 1 tablet by mouth 5 (five) times daily., Disp: , Rfl: 0 .  PREVIDENT 5000 SENSITIVE 1.1-5 % PSTE, Apply 1 each topically 2 (two) times daily., Disp: , Rfl: 5  Allergies  Allergen Reactions  . Elavil [Amitriptyline Hcl] Rash  . Tape Rash    Paper Tape    I personally reviewed active problem list, medication list, allergies, family history, social history with the patient/caregiver today.   ROS  Constitutional: Negative for fever or weight change.  Respiratory:  positive  for cough and shortness of breath.   Cardiovascular:  Negative for chest pain or palpitations.  Gastrointestinal: positive  for abdominal pain, but no  bowel changes.  Musculoskeletal: Negative for gait problem or joint swelling.  Skin: Negative for rash.  Neurological: Negative for dizziness or headache.  No other specific complaints in a complete review of systems (except as listed in HPI above).  Objective  Vitals:   09/16/18 0918  BP: 128/74  Pulse: 70  Resp: 16  Temp: 98.1 F (36.7 C)  TempSrc: Oral  SpO2: 97%  Weight: 223 lb 4.8 oz (101.3 kg)  Height: '5\' 7"'  (1.702 m)    Body mass index is 34.97 kg/m.  Physical Exam  Constitutional: Patient appears well-developed and well-nourished. Obese  No distress.  HEENT: head atraumatic, normocephalic, pupils equal and reactive to light,  neck supple, throat within normal limits Cardiovascular: Normal rate, regular rhythm and normal heart sounds.  No murmur heard. No BLE edema. Pulmonary/Chest: Effort normal and breath sounds normal. No respiratory distress. Abdominal: Soft.  There is tenderness during palpation of RUQ, umbilical hernia and diathesis recti  Psychiatric: Patient has a normal mood and affect. behavior is normal. Judgment and thought content normal . Recent Results (from the past 2160 hour(s))  POCT HgB A1C     Status: Normal   Collection Time: 09/16/18  9:45 AM  Result Value Ref Range   Hemoglobin A1C     HbA1c POC (<> result, manual entry)     HbA1c, POC (prediabetic range)     HbA1c, POC (controlled diabetic range) 6.2 0.0 - 7.0 %     PHQ2/9: Depression screen [facility] 2/9 09/16/2018 06/16/2018 06/16/2018 05/20/2018 02/18/2018  Decreased Interest 0 3 3 0 0  Down, Depressed, Hopeless 0 3 3 0 0  PHQ - 2 Score 0 6 6 0 0  Altered sleeping 1 0 0 0 0  Tired, decreased energy '1 1 1 3 1  ' Change in appetite 0 0 0 0 0  Feeling bad or failure about yourself  0 0 0 0 0  Trouble concentrating 0 0 0 2 0  Moving  slowly or fidgety/restless 0 0 0 0 0  Suicidal thoughts 0 0 0 0 0  PHQ-9 Score '2 7 7 5 ' -  Difficult doing work/chores Not difficult at all Somewhat difficult Somewhat difficult Not difficult at all Not difficult at all  Some recent data might be hidden     Fall Risk: Fall Risk  09/16/2018 06/16/2018 05/20/2018 02/18/2018 12/16/2017  Falls in the past year? 0 0 No No No  Number falls in past yr: - 0 - - -  Injury with Fall? - - - - -  Risk for fall due to : - - - - -  Follow up - - - - -     Functional Status Survey: Is the patient deaf or have difficulty hearing?: No Does the patient have difficulty seeing, even when wearing glasses/contacts?: No Does the patient have difficulty concentrating, remembering, or making decisions?: No Does the patient have difficulty walking or climbing stairs?: No Does the patient have difficulty dressing or bathing?: No Does the patient have difficulty doing errands alone such as visiting a doctor's office or shopping?: No    Assessment & Plan  1. Dyslipidemia associated with type 2 diabetes mellitus (HCC)  - POCT HgB A1C - metFORMIN (GLUCOPHAGE-XR) 500 MG 24 hr tablet; Take 1 tablet (500 mg total) by mouth daily with breakfast.  Dispense: 90 tablet; Refill: 1  2. CAD in native artery  -  nitroGLYCERIN (NITROSTAT) 0.4 MG SL tablet; DISSOLVE 1 TABLET UNDER THE TONGUE EVERY 5 MINUTES FOR UP TO 3 DOSES AS NEEDED FOR CHEST PAIN. IF NO RELIEF AFTER 3 DOSES, CALL 911 OR GO TO  Dispense: 25 tablet; Refill: 0 - metoprolol succinate (TOPROL-XL) 50 MG 24 hr tablet; Take 1 tablet (50 mg total) by mouth every evening. Take with or immediately following a meal.  Dispense: 90 tablet; Refill: 1 - atorvastatin (LIPITOR) 40 MG tablet; Take 1 tablet (40 mg total) by mouth daily.  Dispense: 90 tablet; Refill: 1  3. Gastroesophageal reflux disease without esophagitis  - Ambulatory referral to Gastroenterology - omeprazole (PRILOSEC) 20 MG capsule; Take 1 capsule  (20 mg total) by mouth daily.  Dispense: 90 capsule; Refill: 1 - ranitidine (ZANTAC) 150 MG tablet; Take 1 tablet (150 mg total) by mouth at bedtime.  Dispense: 30 tablet; Refill: 0  4. Hypertension, benign  - metoprolol succinate (TOPROL-XL) 50 MG 24 hr tablet; Take 1 tablet (50 mg total) by mouth every evening. Take with or immediately following a meal.  Dispense: 90 tablet; Refill: 1 - amLODipine (NORVASC) 2.5 MG tablet; Take 1 tablet (2.5 mg total) by mouth daily.  Dispense: 90 tablet; Refill: 1  5. Depression, major, recurrent, mild (HCC)  - escitalopram (LEXAPRO) 20 MG tablet; Take 1 tablet (20 mg total) by mouth daily.  Dispense: 90 tablet; Refill: 1 - buPROPion (WELLBUTRIN XL) 150 MG 24 hr tablet; Take 3 tablets (450 mg total) by mouth daily.  Dispense: 270 tablet; Refill: 1  6. Mixed hyperlipidemia  - atorvastatin (LIPITOR) 40 MG tablet; Take 1 tablet (40 mg total) by mouth daily.  Dispense: 90 tablet; Refill: 1  7. Right upper quadrant abdominal pain  - US Abdomen Limited RUQ; Future  8. Cough  - DG Chest 2 View; Future if positive weill start him on Augmentin  - benzonatate (TESSALON) 100 MG capsule; Take 1-2 capsules (100-200 mg total) by mouth 2 (two) times daily as needed.  Dispense: 40 capsule; Refill: 0

## 2018-09-16 NOTE — Telephone Encounter (Signed)
Patient notified of his X-Ray results.

## 2018-09-16 NOTE — Telephone Encounter (Signed)
Copied from [place] [id number]. Topic: Quick Communication - See Telephone Encounter >> Sep 16, 2018  4:42 PM Vernona Rieger wrote: CRM for notification. See Telephone encounter for: 09/16/18.  Pt's wife is requesting his xray results from today.

## 2018-09-21 ENCOUNTER — Other Ambulatory Visit: Payer: Self-pay | Admitting: Family Medicine

## 2018-09-21 ENCOUNTER — Encounter: Payer: Self-pay | Admitting: *Deleted

## 2018-09-21 DIAGNOSIS — J398 Other specified diseases of upper respiratory tract: Secondary | ICD-10-CM

## 2018-09-21 DIAGNOSIS — E049 Nontoxic goiter, unspecified: Secondary | ICD-10-CM

## 2018-09-21 DIAGNOSIS — R9389 Abnormal findings on diagnostic imaging of other specified body structures: Secondary | ICD-10-CM

## 2018-09-22 ENCOUNTER — Ambulatory Visit
Admission: RE | Admit: 2018-09-22 | Discharge: 2018-09-22 | Disposition: A | Discharge status: Home / Self Care | Payer: Medicare Other | Source: Ambulatory Visit | Attending: Family Medicine | Admitting: Family Medicine

## 2018-09-22 DIAGNOSIS — R9389 Abnormal findings on diagnostic imaging of other specified body structures: Secondary | ICD-10-CM | POA: Diagnosis not present

## 2018-09-22 DIAGNOSIS — E049 Nontoxic goiter, unspecified: Secondary | ICD-10-CM

## 2018-09-22 DIAGNOSIS — J398 Other specified diseases of upper respiratory tract: Secondary | ICD-10-CM

## 2018-09-22 DIAGNOSIS — R1011 Right upper quadrant pain: Secondary | ICD-10-CM

## 2018-10-03 DIAGNOSIS — M545 Low back pain: Secondary | ICD-10-CM | POA: Diagnosis not present

## 2018-10-03 DIAGNOSIS — G8929 Other chronic pain: Secondary | ICD-10-CM | POA: Diagnosis not present

## 2018-10-03 DIAGNOSIS — M9904 Segmental and somatic dysfunction of sacral region: Secondary | ICD-10-CM | POA: Diagnosis not present

## 2018-10-03 DIAGNOSIS — M47897 Other spondylosis, lumbosacral region: Secondary | ICD-10-CM | POA: Diagnosis not present

## 2018-10-03 DIAGNOSIS — Z96642 Presence of left artificial hip joint: Secondary | ICD-10-CM | POA: Diagnosis not present

## 2018-10-03 DIAGNOSIS — M5136 Other intervertebral disc degeneration, lumbar region: Secondary | ICD-10-CM | POA: Diagnosis not present

## 2018-10-03 DIAGNOSIS — E1140 Type 2 diabetes mellitus with diabetic neuropathy, unspecified: Secondary | ICD-10-CM | POA: Diagnosis not present

## 2018-10-12 IMAGING — CR DG CHEST 2V
1 series · 2 of 2 positions shown · non-contrast
Comparison: Chest x-ray dated July 08, 2015.

CLINICAL DATA: Cough and shortness of breath.

EXAM:
CHEST - 2 VIEW

[Series 1: dg chest 2 view · 0.14mm/px · 2 of 2 slices shown]
[im 1/2]
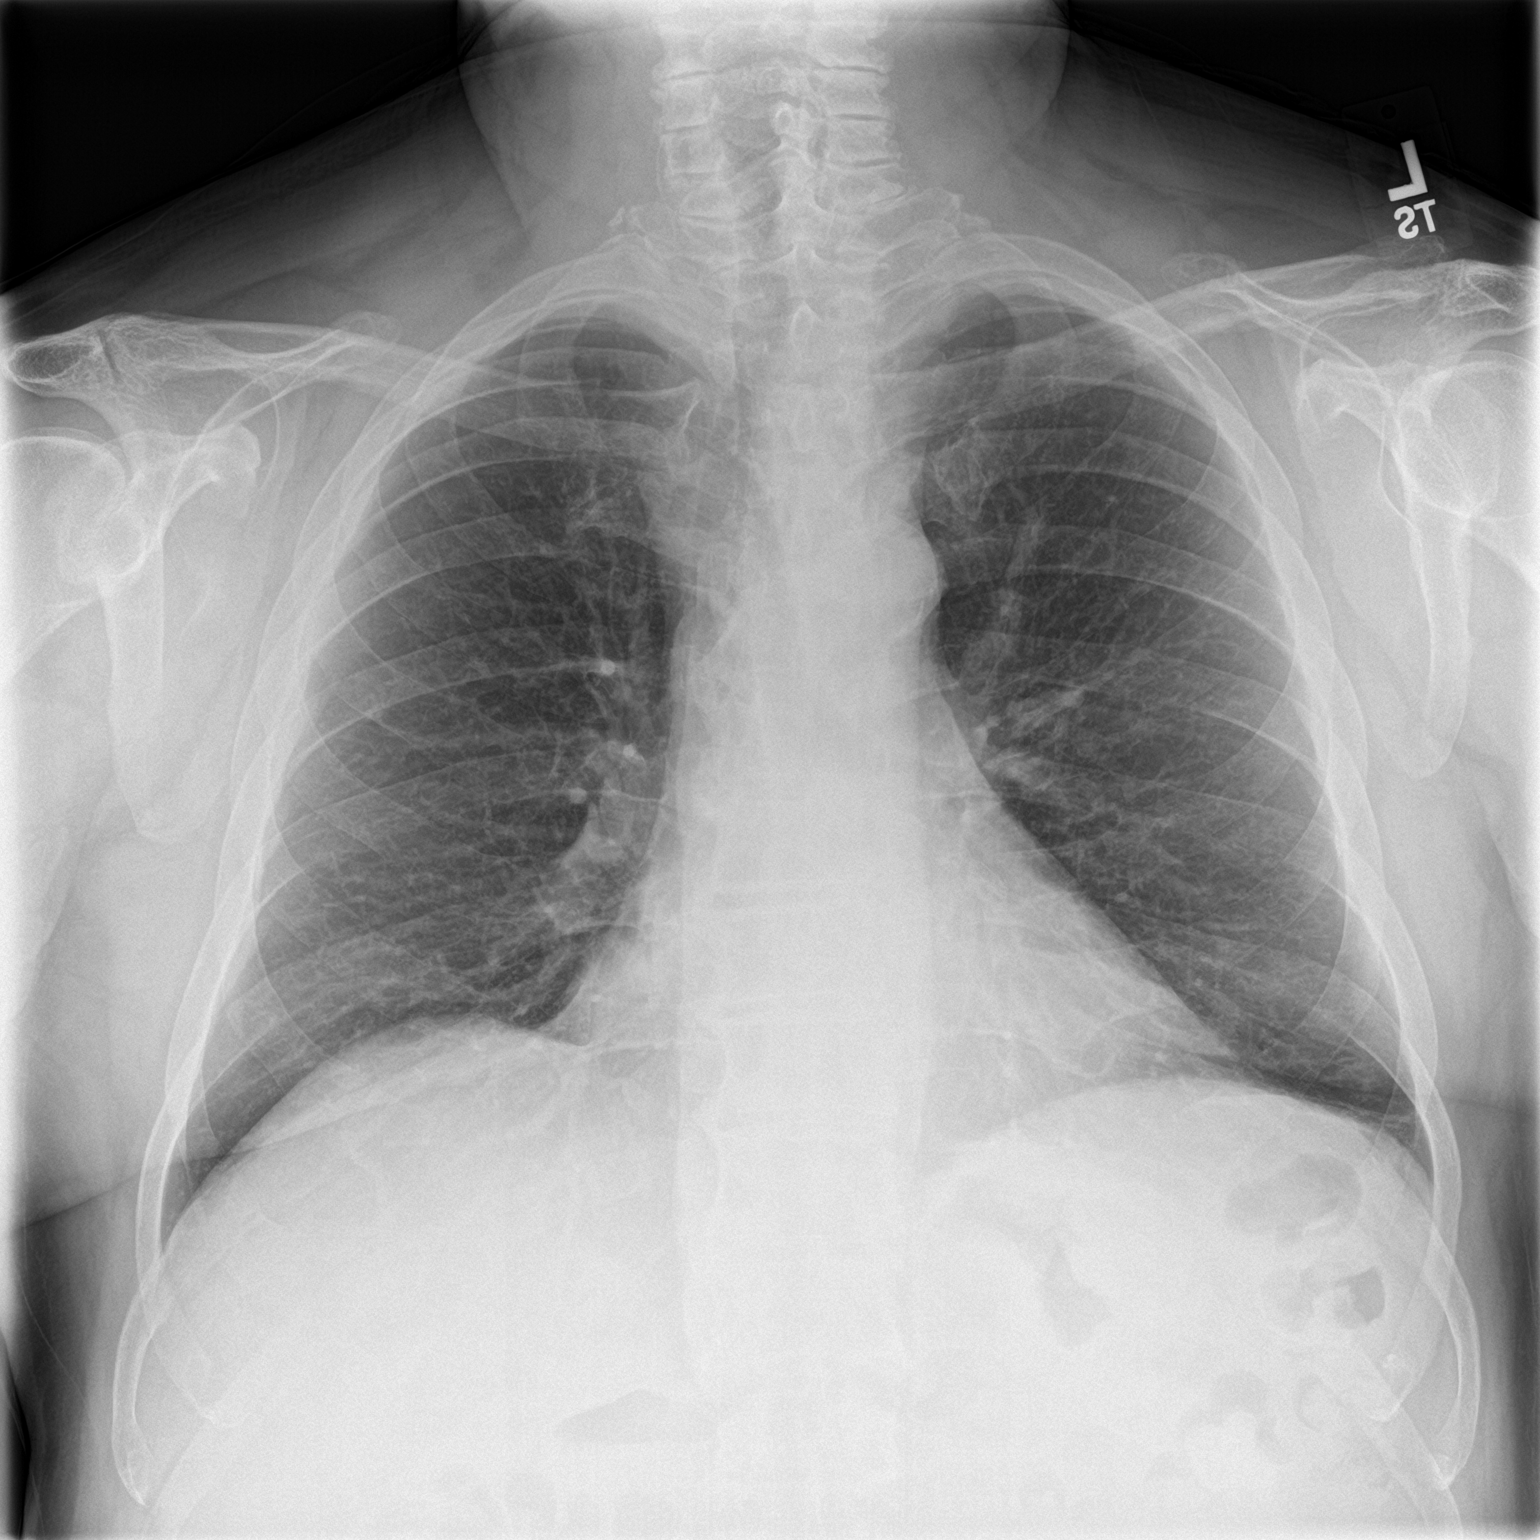
[im 2/2]
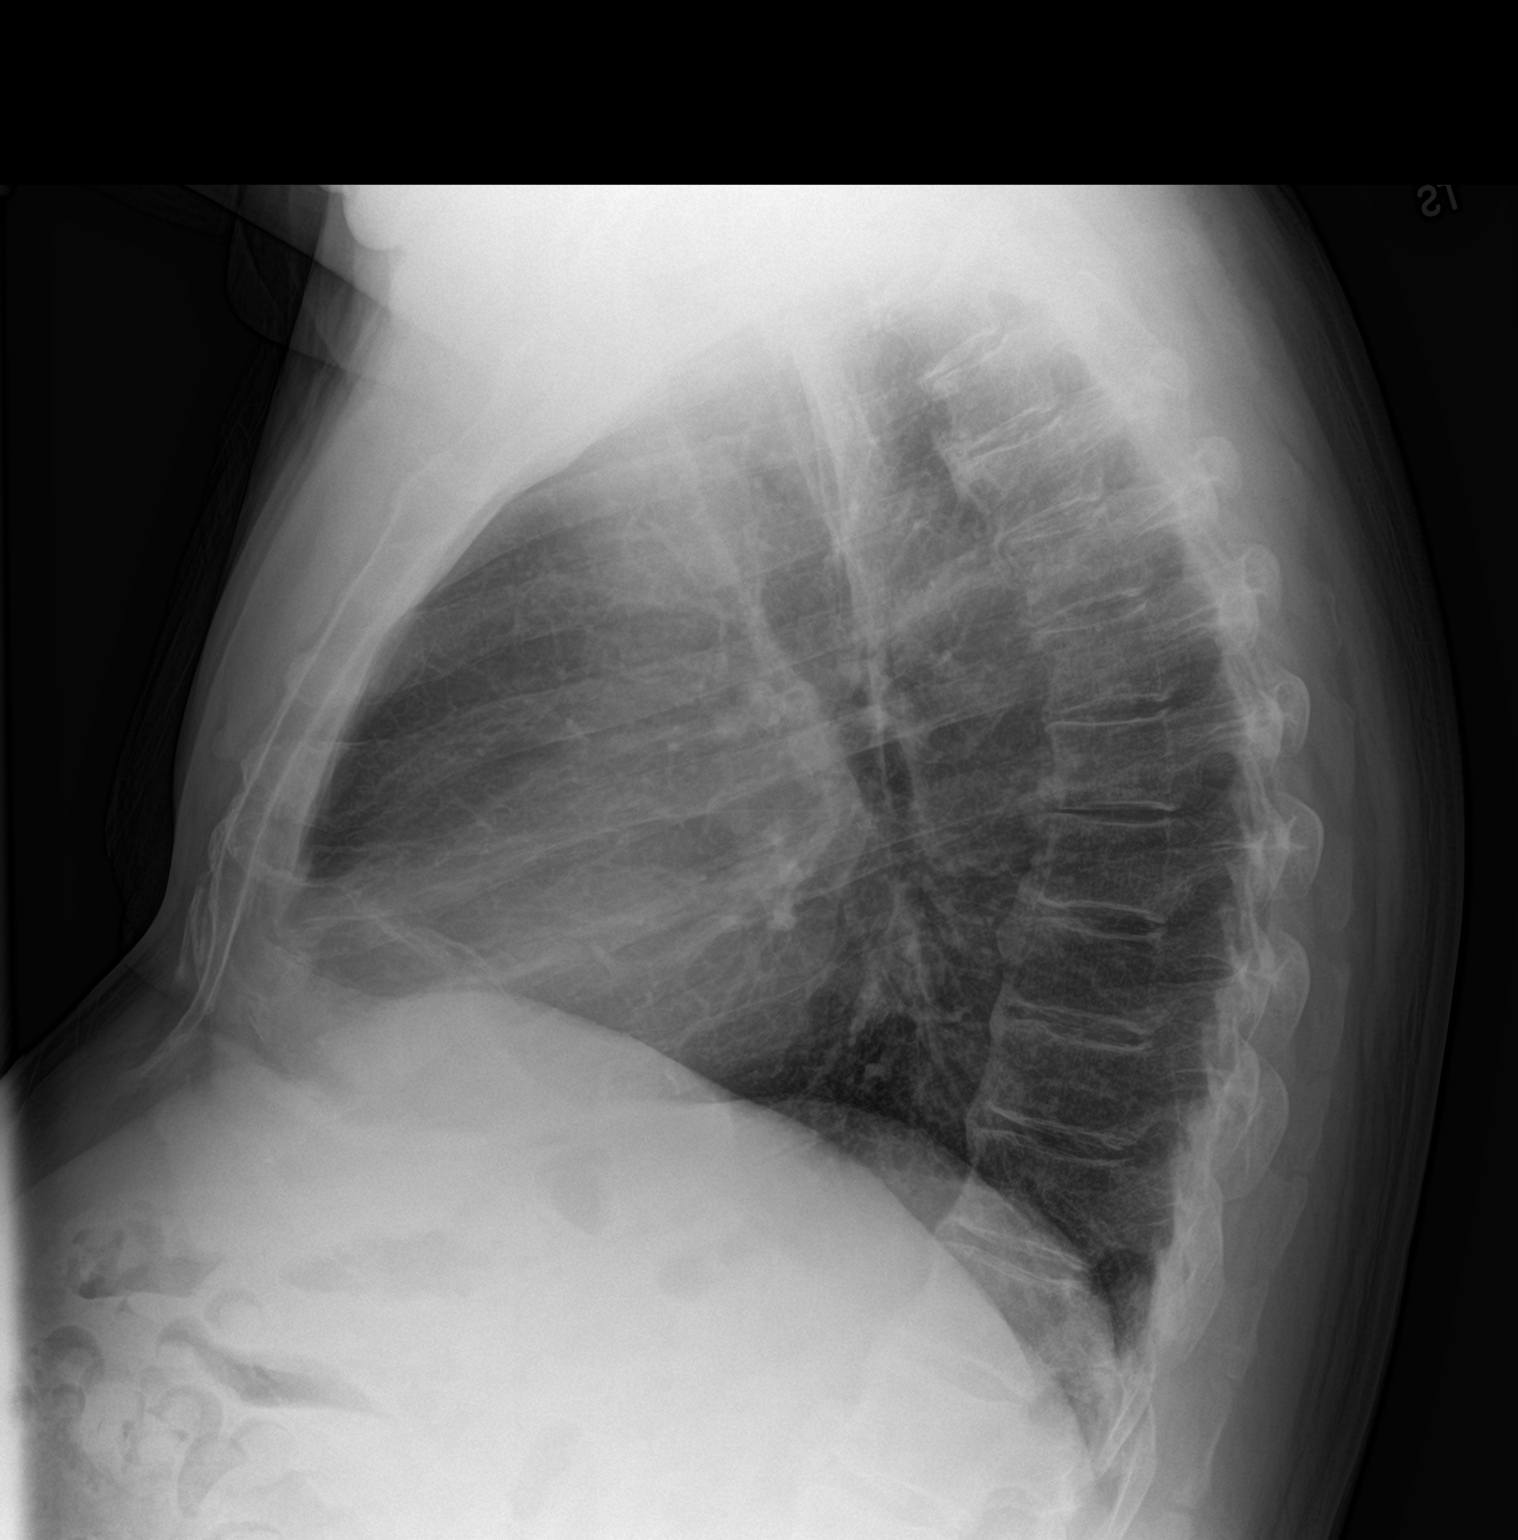

[2 of 2 positions shown; findings below may reference images not displayed]

FINDINGS: Stable mild cardiomegaly. Normal pulmonary vascularity. No focal
consolidation, pleural effusion, or pneumothorax. No acute osseous
abnormality.
IMPRESSION: No active cardiopulmonary disease.

## 2018-10-17 ENCOUNTER — Telehealth: Payer: Self-pay

## 2018-10-17 NOTE — Telephone Encounter (Signed)
Copied from [place] [phone]. Topic: General - Inquiry >> Oct 17, 2018 12:58 PM Sheran Luz wrote: Reason for CRM: Patient would like to know if it were okay to put off b-12 injection for a while as he does not want to come into office due to covid-19 fears. Please advise.

## 2018-10-18 ENCOUNTER — Ambulatory Visit: Payer: Medicare Other | Admitting: Gastroenterology

## 2018-10-18 NOTE — Telephone Encounter (Signed)
Patient was notified and will get B12 otc and take that while the Coronavirus is a threat here.

## 2018-10-25 ENCOUNTER — Telehealth: Payer: Self-pay | Admitting: Family Medicine

## 2018-10-25 ENCOUNTER — Other Ambulatory Visit: Payer: Self-pay | Admitting: Family Medicine

## 2018-10-25 NOTE — Telephone Encounter (Signed)
Hypertension medication request: HCTZ  Last office visit pertaining to hypertension: 09/16/2018   BP Readings from Last 3 Encounters:  09/16/18 128/74  06/16/18 140/90  06/16/18 140/90    Lab Results  Component Value Date   CREATININE 0.62 (L) 12/16/2017   BUN 8 12/16/2017   NA 139 12/16/2017   K 4.0 12/16/2017   CL 102 12/16/2017   CO2 31 12/16/2017     Follow up on 06/20/2019

## 2018-10-26 NOTE — Telephone Encounter (Signed)
appt made for 7.8.2020

## 2018-10-31 DIAGNOSIS — Z96642 Presence of left artificial hip joint: Secondary | ICD-10-CM | POA: Diagnosis not present

## 2018-10-31 DIAGNOSIS — Z5181 Encounter for therapeutic drug level monitoring: Secondary | ICD-10-CM | POA: Diagnosis not present

## 2018-10-31 DIAGNOSIS — M9904 Segmental and somatic dysfunction of sacral region: Secondary | ICD-10-CM | POA: Diagnosis not present

## 2018-10-31 DIAGNOSIS — M545 Low back pain: Secondary | ICD-10-CM | POA: Diagnosis not present

## 2018-11-09 ENCOUNTER — Other Ambulatory Visit: Payer: Self-pay | Admitting: Family Medicine

## 2018-11-09 DIAGNOSIS — E785 Hyperlipidemia, unspecified: Principal | ICD-10-CM

## 2018-11-09 DIAGNOSIS — E1169 Type 2 diabetes mellitus with other specified complication: Secondary | ICD-10-CM

## 2018-11-22 ENCOUNTER — Telehealth: Payer: Self-pay

## 2018-11-22 MED ORDER — FAMOTIDINE 40 MG PO TABS: 40.0000 mg | ORAL_TABLET | Freq: Every day | ORAL | 0 refills | Status: DC

## 2018-11-25 NOTE — Telephone Encounter (Signed)
Erroneous Entry  

## 2018-11-28 DIAGNOSIS — M545 Low back pain: Secondary | ICD-10-CM | POA: Diagnosis not present

## 2018-11-28 DIAGNOSIS — M9904 Segmental and somatic dysfunction of sacral region: Secondary | ICD-10-CM | POA: Diagnosis not present

## 2018-11-28 DIAGNOSIS — Z96642 Presence of left artificial hip joint: Secondary | ICD-10-CM | POA: Diagnosis not present

## 2018-11-28 DIAGNOSIS — Z5181 Encounter for therapeutic drug level monitoring: Secondary | ICD-10-CM | POA: Diagnosis not present

## 2018-12-06 ENCOUNTER — Ambulatory Visit: Payer: Medicare Other | Admitting: Gastroenterology

## 2018-12-10 ENCOUNTER — Other Ambulatory Visit: Payer: Self-pay | Admitting: Family Medicine

## 2018-12-10 DIAGNOSIS — E782 Mixed hyperlipidemia: Secondary | ICD-10-CM

## 2018-12-10 DIAGNOSIS — I2510 Atherosclerotic heart disease of native coronary artery without angina pectoris: Secondary | ICD-10-CM

## 2018-12-27 DIAGNOSIS — M9904 Segmental and somatic dysfunction of sacral region: Secondary | ICD-10-CM | POA: Diagnosis not present

## 2018-12-27 DIAGNOSIS — Z96642 Presence of left artificial hip joint: Secondary | ICD-10-CM | POA: Diagnosis not present

## 2018-12-27 DIAGNOSIS — M545 Low back pain: Secondary | ICD-10-CM | POA: Diagnosis not present

## 2018-12-27 DIAGNOSIS — E1140 Type 2 diabetes mellitus with diabetic neuropathy, unspecified: Secondary | ICD-10-CM | POA: Diagnosis not present

## 2019-01-09 DIAGNOSIS — D485 Neoplasm of uncertain behavior of skin: Secondary | ICD-10-CM | POA: Diagnosis not present

## 2019-01-09 DIAGNOSIS — D171 Benign lipomatous neoplasm of skin and subcutaneous tissue of trunk: Secondary | ICD-10-CM | POA: Diagnosis not present

## 2019-01-16 DIAGNOSIS — L820 Inflamed seborrheic keratosis: Secondary | ICD-10-CM | POA: Diagnosis not present

## 2019-01-16 DIAGNOSIS — D171 Benign lipomatous neoplasm of skin and subcutaneous tissue of trunk: Secondary | ICD-10-CM | POA: Diagnosis not present

## 2019-01-23 DIAGNOSIS — M47897 Other spondylosis, lumbosacral region: Secondary | ICD-10-CM | POA: Diagnosis not present

## 2019-01-23 DIAGNOSIS — M48062 Spinal stenosis, lumbar region with neurogenic claudication: Secondary | ICD-10-CM | POA: Diagnosis not present

## 2019-01-23 DIAGNOSIS — M9973 Connective tissue and disc stenosis of intervertebral foramina of lumbar region: Secondary | ICD-10-CM | POA: Diagnosis not present

## 2019-01-23 DIAGNOSIS — M9904 Segmental and somatic dysfunction of sacral region: Secondary | ICD-10-CM | POA: Diagnosis not present

## 2019-01-23 DIAGNOSIS — G8929 Other chronic pain: Secondary | ICD-10-CM | POA: Diagnosis not present

## 2019-01-23 DIAGNOSIS — Z85828 Personal history of other malignant neoplasm of skin: Secondary | ICD-10-CM | POA: Diagnosis not present

## 2019-01-23 DIAGNOSIS — D171 Benign lipomatous neoplasm of skin and subcutaneous tissue of trunk: Secondary | ICD-10-CM | POA: Diagnosis not present

## 2019-01-23 DIAGNOSIS — Z96642 Presence of left artificial hip joint: Secondary | ICD-10-CM | POA: Diagnosis not present

## 2019-01-23 DIAGNOSIS — M545 Low back pain: Secondary | ICD-10-CM | POA: Diagnosis not present

## 2019-01-23 DIAGNOSIS — M792 Neuralgia and neuritis, unspecified: Secondary | ICD-10-CM | POA: Diagnosis not present

## 2019-01-23 DIAGNOSIS — E1140 Type 2 diabetes mellitus with diabetic neuropathy, unspecified: Secondary | ICD-10-CM | POA: Diagnosis not present

## 2019-01-30 ENCOUNTER — Other Ambulatory Visit: Payer: Self-pay | Admitting: Family Medicine

## 2019-02-07 ENCOUNTER — Other Ambulatory Visit: Payer: Self-pay

## 2019-02-07 ENCOUNTER — Other Ambulatory Visit: Payer: Self-pay | Admitting: Family Medicine

## 2019-02-07 NOTE — Patient Outreach (Signed)
[place] [facility]) Care Management  02/07/2019  JANSON LAMAR May 01, 1951 [id number]   Medication Adherence call to Mr. Corion Sherrod HIPPA Compliant Voice message left with a call back number. Mr. Edwina Barth is showing past due on Losartan 100 mg under [place].   [place] Management Direct Dial [phone]  Fax [phone] [email or web address]

## 2019-02-08 ENCOUNTER — Other Ambulatory Visit: Payer: Self-pay

## 2019-02-08 ENCOUNTER — Ambulatory Visit (INDEPENDENT_AMBULATORY_CARE_PROVIDER_SITE_OTHER): Payer: Medicare Other | Admitting: Family Medicine

## 2019-02-08 ENCOUNTER — Encounter: Payer: Self-pay | Admitting: Family Medicine

## 2019-02-08 DIAGNOSIS — E1169 Type 2 diabetes mellitus with other specified complication: Secondary | ICD-10-CM

## 2019-02-08 DIAGNOSIS — I10 Essential (primary) hypertension: Secondary | ICD-10-CM | POA: Diagnosis not present

## 2019-02-08 DIAGNOSIS — K219 Gastro-esophageal reflux disease without esophagitis: Secondary | ICD-10-CM

## 2019-02-08 DIAGNOSIS — E785 Hyperlipidemia, unspecified: Secondary | ICD-10-CM

## 2019-02-08 DIAGNOSIS — E782 Mixed hyperlipidemia: Secondary | ICD-10-CM | POA: Diagnosis not present

## 2019-02-08 DIAGNOSIS — I2510 Atherosclerotic heart disease of native coronary artery without angina pectoris: Secondary | ICD-10-CM

## 2019-02-08 DIAGNOSIS — F330 Major depressive disorder, recurrent, mild: Secondary | ICD-10-CM | POA: Diagnosis not present

## 2019-02-08 MED ORDER — ESCITALOPRAM OXALATE 20 MG PO TABS: 20.0000 mg | ORAL_TABLET | Freq: Every day | ORAL | 1 refills | Status: DC

## 2019-02-08 MED ORDER — FAMOTIDINE 40 MG PO TABS: 40.0000 mg | ORAL_TABLET | Freq: Every day | ORAL | 0 refills | Status: DC

## 2019-02-08 MED ORDER — OMEPRAZOLE 20 MG PO CPDR: 20.0000 mg | DELAYED_RELEASE_CAPSULE | Freq: Every day | ORAL | 1 refills | Status: DC

## 2019-02-08 MED ORDER — BUPROPION HCL ER (XL) 150 MG PO TB24: 450.0000 mg | ORAL_TABLET | Freq: Every day | ORAL | 1 refills | Status: DC

## 2019-02-08 MED ORDER — LOSARTAN POTASSIUM 100 MG PO TABS: 50.0000 mg | ORAL_TABLET | Freq: Every day | ORAL | 0 refills | Status: DC

## 2019-02-08 MED ORDER — ATORVASTATIN CALCIUM 40 MG PO TABS: 40.0000 mg | ORAL_TABLET | Freq: Every day | ORAL | 0 refills | Status: DC

## 2019-02-08 MED ORDER — HYDROCHLOROTHIAZIDE 25 MG PO TABS: 25.0000 mg | ORAL_TABLET | Freq: Every day | ORAL | 0 refills | Status: DC

## 2019-02-08 MED ORDER — AMLODIPINE BESYLATE 2.5 MG PO TABS: 2.5000 mg | ORAL_TABLET | Freq: Every day | ORAL | 1 refills | Status: DC

## 2019-02-08 MED ORDER — METFORMIN HCL ER 500 MG PO TB24: 500.0000 mg | ORAL_TABLET | Freq: Every day | ORAL | 1 refills | Status: DC

## 2019-02-08 NOTE — Progress Notes (Signed)
 Name: Jimmy Green   MRN: [id number]    DOB: 01/01/1951   Date:02/08/2019       Progress Note  Subjective  Chief Complaint  Chief Complaint  Patient presents with  . Medication Refill  . Diabetes  . Hypertension    I connected with  Keyshaun C Godette on 02/08/19 at  8:20 AM EDT by telephone and verified that I am speaking with the correct person using two identifiers.  I discussed the limitations, risks, security and privacy concerns of performing an evaluation and management service by telephone and the availability of in person appointments. Staff also discussed with the patient that there may be a patient responsible charge related to this service. Patient Location: at home  Provider Location: [facility]   HPI  Depression Major:taking medication daily, no suicidal thoughts or ideation,he is currently in remission. PHq9 is zero  GERD/Cough: he was having severe symptoms earlier this year, but states no recent episodes of chocking and nocturnal cough improved, he decided to postpone visit because of COVID-19   Dyslipidemia with type II DM: he denies polyphagia, polydipsia or polyuria. His hgbA1C was 6.7% diagnosed March 2018,hgbA1C down to 6.2 % . He continues to try to eat healthier, eating a lot of vegetabls. He has been takingaspirin daily, ARBand statin therapy.Eye exam is due, advised him to call eye doctor.Taking metforminas recommended, denies side effects.   HTN:  she has been taking medication daily.BP not being checked at home lately.   Hyperlipidemia: taking Atorvastatin and denies side effects. No myalgia.He will return for labs next week.    OSA: he got a CPAP mask was not fitting properly and causing right forehead pain , ordered by Dr. Fath,he stopped using it.Advised to discuss it with Dr. Fath on his last visit, but appointment was cancelled because of COVID-19   History of basal cell carcinoma: Seeing Dermatologist, Dr.  Stewart every 6 months , unchanged   CAD: taking aspirin, statin and beta blocker and ARB,and he has NTG in his pocket, but has not used it yet.. Nochest pain or decrease in exercise tolerance. He was seen by Dr. Fath in01/2019, and had another heart cath that showed no significant disease, 55-70% stenosis on the LAD , on medical management. Unchanged .   Left hip replacement history :under the care of Dr. Crisp on pain medication and pain today is 4/10, still under the care of pain clinic, symptoms usually present during activity     Patient Active Problem List   Diagnosis Date Noted  . BPH with obstruction/lower urinary tract symptoms 05/20/2018  . Basal cell carcinoma 03/28/2018  . History of basal cell carcinoma 03/23/2018  . Primary localized osteoarthritis of left hip 04/06/2017  . Dyslipidemia associated with type 2 diabetes mellitus (HCC) 10/04/2016  . Trigger thumb of both thumbs 06/02/2016  . Chronic tension-type headache, intractable 09/25/2015  . Benign paroxysmal positional vertigo due to bilateral vestibular disorder 09/25/2015  . Hearing loss sensory, bilateral 08/26/2015  . Cerebral microvascular disease 08/26/2015  . Metabolic syndrome 07/23/2015  . Vertigo 06/21/2015  . Buzzing in ear 06/21/2015  . Hypertension goal BP (blood pressure) < 140/90 03/28/2015  . GERD (gastroesophageal reflux disease) 03/28/2015  . Hyperlipidemia 03/28/2015  . Depression, major 03/28/2015  . CAD in native artery 03/28/2015  . Actinic keratosis 02/14/2015  . Obstructive sleep apnea 02/03/2015  . Diverticulosis of colon without hemorrhage 01/31/2015  . Hx of colonic polyps   . Benign neoplasm of descending   colon   . Benign neoplasm of sigmoid colon   . Idiopathic colitis   . Sebaceous cyst 09/14/2014    Past Surgical History:  Procedure Laterality Date  . BACK SURGERY    . BASAL CELL CARCINOMA EXCISION Left 12/02/2015   Chest-Done by Dermatologist   . BASAL CELL CARCINOMA  EXCISION Left 02/21/2018   Nodulocytic pattern, deep margin involved - [facility] - Dr. Tara Stewart  . BASAL CELL CARCINOMA EXCISION  02/23/2018  . CARDIAC CATHETERIZATION Left 04/01/2016   Procedure: Left Heart Cath and Coronary Angiography;  Surgeon: Kenneth A Fath, MD;  Location: [facility] INVASIVE CV LAB;  Service: Cardiovascular;  Laterality: Left;  . CARDIAC CATHETERIZATION N/A 04/01/2016   Procedure: Intravascular Pressure Wire/FFR Study;  Surgeon: Alexander Paraschos, MD;  Location: [facility] INVASIVE CV LAB;  Service: Cardiovascular;  Laterality: N/A;  . COLONOSCOPY N/A 01/15/2015   Wohl-ileitis, 2 benign polyps, cryptitis, sigmoid diverticulosis, focal ulceration ICV  . CORONARY STENT PLACEMENT    . FRACTIONAL FLOW RESERVE WIRE  10/08/2011   Procedure: FRACTIONAL FLOW RESERVE WIRE;  Surgeon: Mohan N Harwani, MD;  Location: [facility] CATH LAB;  Service: Cardiovascular;;  . HERNIA REPAIR    . KNEE SURGERY    . LEFT HEART CATHETERIZATION WITH CORONARY ANGIOGRAM N/A 10/08/2011   Procedure: LEFT HEART CATHETERIZATION WITH CORONARY ANGIOGRAM;  Surgeon: Mohan N Harwani, MD;  Location: [facility] CATH LAB;  Service: Cardiovascular;  Laterality: N/A;  . SKIN CANCER EXCISION  01/2018   [facility]  . TOTAL HIP ARTHROPLASTY Left 04/06/2017   Procedure: TOTAL HIP ARTHROPLASTY ANTERIOR APPROACH;  Surgeon: Menz, Michael, MD;  Location: [facility] ORS;  Service: Orthopedics;  Laterality: Left;    Family History  Problem Relation Age of Onset  . Lung cancer Father   . Heart disease Father   . Skin cancer Father     Social History   Socioeconomic History  . Marital status: Married    Spouse name: Janice  . Number of children: 1  . Years of education: Not on file  . Highest education level: High school graduate  Occupational History  . Occupation: retired  Social Needs  . Financial resource strain: Not hard at all  . Food insecurity    Worry: Never true    Inability: Never true  . Transportation  needs    Medical: No    Non-medical: No  Tobacco Use  . Smoking status: Former Smoker    Years: 40.00    Types: Cigarettes    Quit date: 08/03/2012    Years since quitting: 6.5  . Smokeless tobacco: Never Used  Substance and Sexual Activity  . Alcohol use: No    Alcohol/week: 0.0 standard drinks  . Drug use: No  . Sexual activity: Yes    Partners: Female    Birth control/protection: None  Lifestyle  . Physical activity    Days per week: 0 days    Minutes per session: 0 min  . Stress: To some extent  Relationships  . Social connections    Talks on phone: Once a week    Gets together: Three times a week    Attends religious service: Never    Active member of club or organization: No    Attends meetings of clubs or organizations: Never    Relationship status: Not on file  . Intimate partner violence    Fear of current or ex partner: No    Emotionally abused: No    Physically abused: No      Forced sexual activity: No  Other Topics Concern  . Not on file  Social History Narrative   Wife has 2 children by another marriage and he has 1 child by a previous marriage.     Current Outpatient Medications:  .  amLODipine (NORVASC) 2.5 MG tablet, Take 1 tablet (2.5 mg total) by mouth daily., Disp: 90 tablet, Rfl: 1 .  aspirin EC 81 MG tablet, Take 81 mg by mouth every morning. , Disp: , Rfl:  .  atorvastatin (LIPITOR) 40 MG tablet, Take 1 tablet (40 mg total) by mouth daily., Disp: 90 tablet, Rfl: 0 .  bisacodyl (DULCOLAX) 10 MG suppository, Place 1 suppository (10 mg total) rectally daily as needed for moderate constipation., Disp: 12 suppository, Rfl: 0 .  buPROPion (WELLBUTRIN XL) 150 MG 24 hr tablet, Take 3 tablets (450 mg total) by mouth daily., Disp: 270 tablet, Rfl: 1 .  diclofenac sodium (VOLTAREN) 1 % GEL, Apply topically., Disp: , Rfl:  .  docusate sodium (COLACE) 100 MG capsule, Take 1 capsule (100 mg total) by mouth 2 (two) times daily., Disp: 10 capsule, Rfl: 0 .   escitalopram (LEXAPRO) 20 MG tablet, Take 1 tablet (20 mg total) by mouth daily., Disp: 90 tablet, Rfl: 1 .  famotidine (PEPCID) 40 MG tablet, Take 1 tablet (40 mg total) by mouth daily., Disp: 90 tablet, Rfl: 0 .  fluticasone (FLONASE) 50 MCG/ACT nasal spray, Place 2 sprays into both nostrils daily., Disp: 16 g, Rfl: 0 .  glucose blood (ACCU-CHEK ACTIVE STRIPS) test strip, Use as instructed, Disp: 100 each, Rfl: 12 .  hydrochlorothiazide (HYDRODIURIL) 25 MG tablet, Take 1 tablet (25 mg total) by mouth daily., Disp: 90 tablet, Rfl: 0 .  Lancets (ACCU-CHEK SOFT TOUCH) lancets, Use as instructed, Disp: 100 each, Rfl: 12 .  losartan (COZAAR) 100 MG tablet, Take 0.5 tablets (50 mg total) by mouth daily., Disp: 90 tablet, Rfl: 0 .  metFORMIN (GLUCOPHAGE-XR) 500 MG 24 hr tablet, Take 1 tablet (500 mg total) by mouth daily with breakfast., Disp: 90 tablet, Rfl: 1 .  metoprolol succinate (TOPROL-XL) 50 MG 24 hr tablet, Take 1 tablet (50 mg total) by mouth every evening. Take with or immediately following a meal., Disp: 90 tablet, Rfl: 1 .  metroNIDAZOLE (METROGEL) 0.75 % gel, , Disp: , Rfl:  .  NARCAN 4 MG/0.1ML LIQD nasal spray kit, , Disp: , Rfl: 0 .  nitroGLYCERIN (NITROSTAT) 0.4 MG SL tablet, DISSOLVE 1 TABLET UNDER THE TONGUE EVERY 5 MINUTES FOR UP TO 3 DOSES AS NEEDED FOR CHEST PAIN. IF NO RELIEF AFTER 3 DOSES, CALL 911 OR GO TO, Disp: 25 tablet, Rfl: 0 .  omeprazole (PRILOSEC) 20 MG capsule, Take 1 capsule (20 mg total) by mouth daily., Disp: 90 capsule, Rfl: 1 .  Oxycodone HCl 10 MG TABS, Take 1 tablet by mouth 5 (five) times daily., Disp: , Rfl: 0 .  PREVIDENT 5000 SENSITIVE 1.1-5 % PSTE, Apply 1 each topically 2 (two) times daily., Disp: , Rfl: 5  Allergies  Allergen Reactions  . Elavil [Amitriptyline Hcl] Rash  . Tape Rash    Paper Tape    I personally reviewed active problem list, medication list, allergies, family history with the patient/caregiver today.   ROS  Constitutional:  Negative for fever or weight change.  Respiratory: Negative for cough and shortness of breath.   Cardiovascular: Negative for chest pain or palpitations.  Gastrointestinal: Negative for abdominal pain, no bowel changes.  Musculoskeletal: Negative for gait problem or   joint swelling.  Skin: Negative for rash.  Neurological: Negative for dizziness or headache.  No other specific complaints in a complete review of systems (except as listed in HPI above).  Objective  There were no vitals filed for this visit.   There is no height or weight on file to calculate BMI.  Physical Exam  Awake, alert and oriented    PHQ2/9: Depression screen PHQ 2/9 02/08/2019 09/16/2018 06/16/2018 06/16/2018 05/20/2018  Decreased Interest 0 0 3 3 0  Down, Depressed, Hopeless 0 0 3 3 0  PHQ - 2 Score 0 0 6 6 0  Altered sleeping 0 1 0 0 0  Tired, decreased energy 0 1 1 1 3  Change in appetite 0 0 0 0 0  Feeling bad or failure about yourself  0 0 0 0 0  Trouble concentrating 0 0 0 0 2  Moving slowly or fidgety/restless 0 0 0 0 0  Suicidal thoughts 0 0 0 0 0  PHQ-9 Score 0 2 7 7 5  Difficult doing work/chores - Not difficult at all Somewhat difficult Somewhat difficult Not difficult at all  Some recent data might be hidden   PHQ-2/9 Result is negative.    Fall Risk: Fall Risk  02/08/2019 09/16/2018 06/16/2018 05/20/2018 02/18/2018  Falls in the past year? 0 0 0 No No  Number falls in past yr: 0 - 0 - -  Injury with Fall? 0 - - - -  Risk for fall due to : - - - - -  Follow up - - - - -     Assessment & Plan  1. Mixed hyperlipidemia  - Lipid panel - atorvastatin (LIPITOR) 40 MG tablet; Take 1 tablet (40 mg total) by mouth daily.  Dispense: 90 tablet; Refill: 0  2. CAD in native artery  - atorvastatin (LIPITOR) 40 MG tablet; Take 1 tablet (40 mg total) by mouth daily.  Dispense: 90 tablet; Refill: 0  3. Hypertension, benign  - COMPLETE METABOLIC PANEL WITH GFR - CBC with Differential/Platelet -  amLODipine (NORVASC) 2.5 MG tablet; Take 1 tablet (2.5 mg total) by mouth daily.  Dispense: 90 tablet; Refill: 1 - hydrochlorothiazide (HYDRODIURIL) 25 MG tablet; Take 1 tablet (25 mg total) by mouth daily.  Dispense: 90 tablet; Refill: 0 - losartan (COZAAR) 100 MG tablet; Take 0.5 tablets (50 mg total) by mouth daily.  Dispense: 90 tablet; Refill: 0  4. Depression, major, recurrent, mild (HCC)  - buPROPion (WELLBUTRIN XL) 150 MG 24 hr tablet; Take 3 tablets (450 mg total) by mouth daily.  Dispense: 270 tablet; Refill: 1 - escitalopram (LEXAPRO) 20 MG tablet; Take 1 tablet (20 mg total) by mouth daily.  Dispense: 90 tablet; Refill: 1  5. Dyslipidemia associated with type 2 diabetes mellitus (HCC)  - Lipid panel - Hemoglobin A1c - Microalbumin / creatinine urine ratio - metFORMIN (GLUCOPHAGE-XR) 500 MG 24 hr tablet; Take 1 tablet (500 mg total) by mouth daily with breakfast.  Dispense: 90 tablet; Refill: 1  6. Gastroesophageal reflux disease without esophagitis  - famotidine (PEPCID) 40 MG tablet; Take 1 tablet (40 mg total) by mouth daily.  Dispense: 90 tablet; Refill: 0 - omeprazole (PRILOSEC) 20 MG capsule; Take 1 capsule (20 mg total) by mouth daily.  Dispense: 90 capsule; Refill: 1  I discussed the assessment and treatment plan with the patient. The patient was provided an opportunity to ask questions and all were answered. The patient agreed with the plan and demonstrated an understanding of the   instructions.   The patient was advised to call back or seek an in-person evaluation if the symptoms worsen or if the condition fails to improve as anticipated.  I provided 25  minutes of non-face-to-face time during this encounter.   F , MD 

## 2019-02-09 ENCOUNTER — Ambulatory Visit: Payer: Medicare Other | Admitting: Gastroenterology

## 2019-02-13 ENCOUNTER — Other Ambulatory Visit: Payer: Self-pay

## 2019-02-13 DIAGNOSIS — E782 Mixed hyperlipidemia: Secondary | ICD-10-CM | POA: Diagnosis not present

## 2019-02-13 DIAGNOSIS — I10 Essential (primary) hypertension: Secondary | ICD-10-CM

## 2019-02-13 DIAGNOSIS — E785 Hyperlipidemia, unspecified: Secondary | ICD-10-CM | POA: Diagnosis not present

## 2019-02-13 DIAGNOSIS — E1169 Type 2 diabetes mellitus with other specified complication: Secondary | ICD-10-CM | POA: Diagnosis not present

## 2019-02-13 MED ORDER — LOSARTAN POTASSIUM-HCTZ 100-12.5 MG PO TABS: 1.0000 | ORAL_TABLET | Freq: Every day | ORAL | 1 refills | Status: DC

## 2019-02-14 LAB — CBC WITH DIFFERENTIAL/PLATELET
Absolute Monocytes: 623 cells/uL (ref 200–950)
Basophils Absolute: 82 cells/uL (ref 0–200)
Basophils Relative: 1 %
Eosinophils Absolute: 271 cells/uL (ref 15–500)
Eosinophils Relative: 3.3 %
HCT: 46.7 % (ref 38.5–50.0)
Hemoglobin: 15.6 g/dL (ref 13.2–17.1)
Lymphs Abs: 1591 cells/uL (ref 850–3900)
MCH: 30.7 pg (ref 27.0–33.0)
MCHC: 33.4 g/dL (ref 32.0–36.0)
MCV: 91.9 fL (ref 80.0–100.0)
MPV: 10.1 fL (ref 7.5–12.5)
Monocytes Relative: 7.6 %
Neutro Abs: 5633 cells/uL (ref 1500–7800)
Neutrophils Relative %: 68.7 %
Platelets: 298 10*3/uL (ref 140–400)
RBC: 5.08 10*6/uL (ref 4.20–5.80)
RDW: 12.8 % (ref 11.0–15.0)
Total Lymphocyte: 19.4 %
WBC: 8.2 10*3/uL (ref 3.8–10.8)

## 2019-02-14 LAB — LIPID PANEL
Cholesterol: 132 mg/dL (ref <200)
HDL: 30 mg/dL — ABNORMAL LOW (ref >=40)
LDL Cholesterol (Calc): 77 mg/dL (calc)
Non-HDL Cholesterol (Calc): 102 mg/dL (calc) (ref <130)
Total CHOL/HDL Ratio: 4.4 (calc) (ref <5.0)
Triglycerides: 151 mg/dL — ABNORMAL HIGH (ref <150)

## 2019-02-14 LAB — COMPLETE METABOLIC PANEL WITH GFR
AG Ratio: 1.6 (calc) (ref 1.0–2.5)
ALT: 38 U/L (ref 9–46)
AST: 21 U/L (ref 10–35)
Albumin: 3.9 g/dL (ref 3.6–5.1)
Alkaline phosphatase (APISO): 98 U/L (ref 35–144)
BUN: 11 mg/dL (ref 7–25)
CO2: 31 mmol/L (ref 20–32)
Calcium: 9.9 mg/dL (ref 8.6–10.3)
Chloride: 101 mmol/L (ref 98–110)
Creat: 0.71 mg/dL (ref 0.70–1.25)
GFR, Est African American: 113 mL/min/{1.73_m2} (ref >=60)
GFR, Est Non African American: 98 mL/min/{1.73_m2} (ref >=60)
Globulin: 2.4 g/dL (calc) (ref 1.9–3.7)
Glucose, Bld: 139 mg/dL — ABNORMAL HIGH (ref 65–99)
Potassium: 4 mmol/L (ref 3.5–5.3)
Sodium: 138 mmol/L (ref 135–146)
Total Bilirubin: 1.1 mg/dL (ref 0.2–1.2)
Total Protein: 6.3 g/dL (ref 6.1–8.1)

## 2019-02-14 LAB — HEMOGLOBIN A1C
Hgb A1c MFr Bld: 6.9 % of total Hgb — ABNORMAL HIGH (ref <5.7)
Mean Plasma Glucose: 151 (calc)
eAG (mmol/L): 8.4 (calc)

## 2019-02-14 LAB — MICROALBUMIN / CREATININE URINE RATIO
Creatinine, Urine: 154 mg/dL (ref 20–320)
Microalb Creat Ratio: 6 mcg/mg creat (ref <30)
Microalb, Ur: 1 mg/dL

## 2019-02-20 DIAGNOSIS — E1140 Type 2 diabetes mellitus with diabetic neuropathy, unspecified: Secondary | ICD-10-CM | POA: Diagnosis not present

## 2019-02-20 DIAGNOSIS — M545 Low back pain: Secondary | ICD-10-CM | POA: Diagnosis not present

## 2019-02-20 DIAGNOSIS — M9904 Segmental and somatic dysfunction of sacral region: Secondary | ICD-10-CM | POA: Diagnosis not present

## 2019-02-20 DIAGNOSIS — Z96642 Presence of left artificial hip joint: Secondary | ICD-10-CM | POA: Diagnosis not present

## 2019-03-09 ENCOUNTER — Other Ambulatory Visit: Payer: Self-pay

## 2019-03-09 ENCOUNTER — Ambulatory Visit (INDEPENDENT_AMBULATORY_CARE_PROVIDER_SITE_OTHER): Payer: Medicare Other

## 2019-03-09 DIAGNOSIS — E538 Deficiency of other specified B group vitamins: Secondary | ICD-10-CM

## 2019-03-09 MED ORDER — CYANOCOBALAMIN 1000 MCG/ML IJ SOLN
1000.0000 ug | Freq: Once | INTRAMUSCULAR | Status: AC
  Administered 2019-03-09: 1000 ug via INTRAMUSCULAR

## 2019-03-20 DIAGNOSIS — Z5181 Encounter for therapeutic drug level monitoring: Secondary | ICD-10-CM | POA: Diagnosis not present

## 2019-03-20 DIAGNOSIS — M545 Low back pain: Secondary | ICD-10-CM | POA: Diagnosis not present

## 2019-03-20 DIAGNOSIS — M9904 Segmental and somatic dysfunction of sacral region: Secondary | ICD-10-CM | POA: Diagnosis not present

## 2019-03-20 DIAGNOSIS — E1140 Type 2 diabetes mellitus with diabetic neuropathy, unspecified: Secondary | ICD-10-CM | POA: Diagnosis not present

## 2019-03-20 DIAGNOSIS — Z96642 Presence of left artificial hip joint: Secondary | ICD-10-CM | POA: Diagnosis not present

## 2019-03-22 ENCOUNTER — Other Ambulatory Visit: Payer: Self-pay | Admitting: Family Medicine

## 2019-03-22 DIAGNOSIS — E782 Mixed hyperlipidemia: Secondary | ICD-10-CM

## 2019-03-22 DIAGNOSIS — I2510 Atherosclerotic heart disease of native coronary artery without angina pectoris: Secondary | ICD-10-CM

## 2019-04-06 ENCOUNTER — Other Ambulatory Visit: Payer: Self-pay | Admitting: Family Medicine

## 2019-04-06 ENCOUNTER — Other Ambulatory Visit: Payer: Self-pay

## 2019-04-06 ENCOUNTER — Ambulatory Visit (INDEPENDENT_AMBULATORY_CARE_PROVIDER_SITE_OTHER): Payer: Medicare Other

## 2019-04-06 DIAGNOSIS — F330 Major depressive disorder, recurrent, mild: Secondary | ICD-10-CM

## 2019-04-06 DIAGNOSIS — E538 Deficiency of other specified B group vitamins: Secondary | ICD-10-CM

## 2019-04-06 MED ORDER — CYANOCOBALAMIN 1000 MCG/ML IJ SOLN
1000.0000 ug | Freq: Once | INTRAMUSCULAR | Status: AC
  Administered 2019-04-06: 1000 ug via INTRAMUSCULAR

## 2019-04-06 NOTE — Progress Notes (Signed)
Patient came in for his B-12 injection. He tolerated it well. NKDA.   

## 2019-04-10 IMAGING — US US ABDOMEN LIMITED
1 series · 14 of 25 positions shown · non-contrast
Comparison: CT 12/19/2010

CLINICAL DATA: Right upper quadrant pain x4 months

EXAM:
ULTRASOUND ABDOMEN LIMITED RIGHT UPPER QUADRANT

[Series 1: us abdomen limited · 0.22mm/px · 14 of 58 slices shown]
[im 1/58]
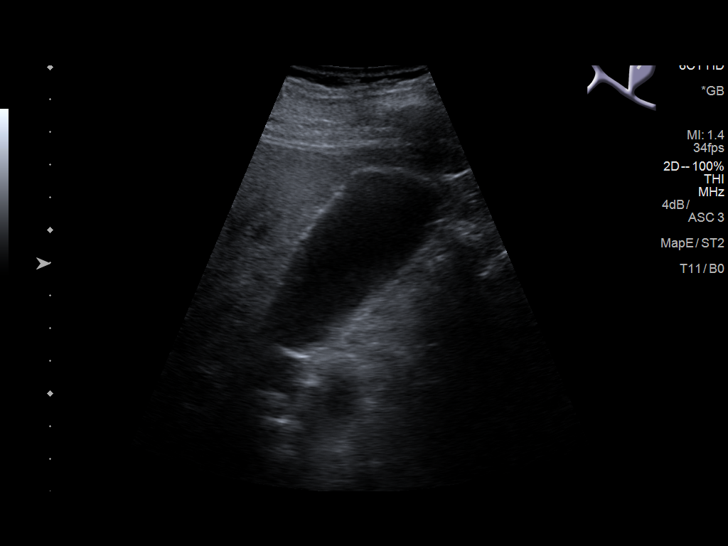
[im 5/58]
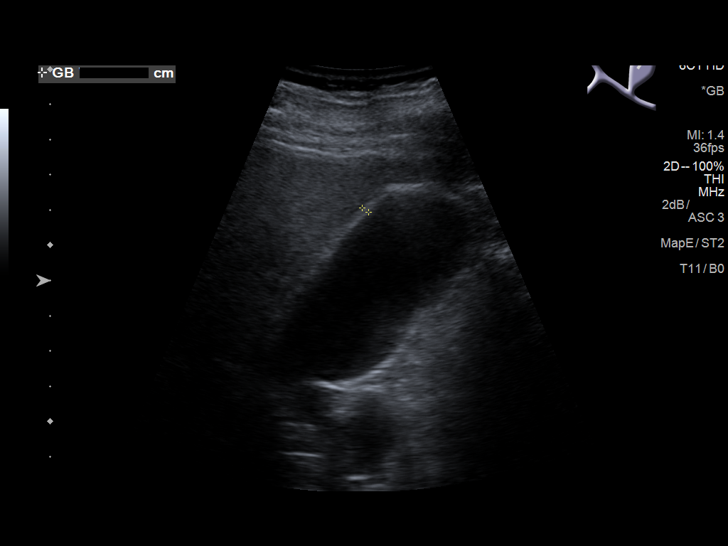
[im 10/58]
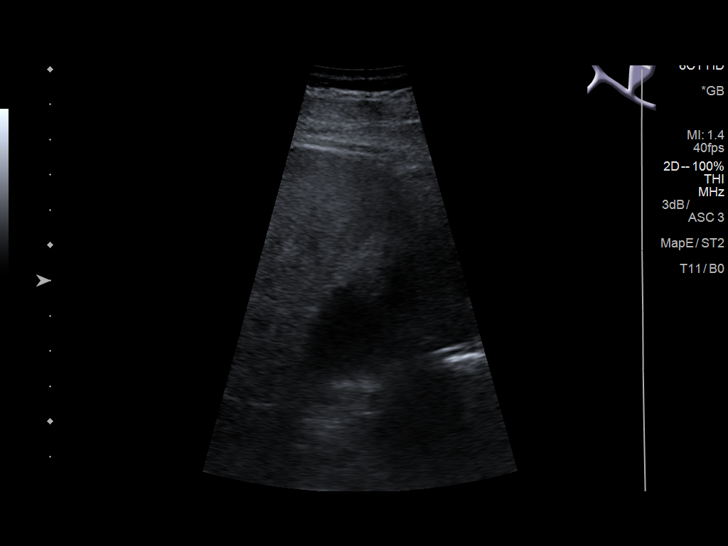
[im 15/58]
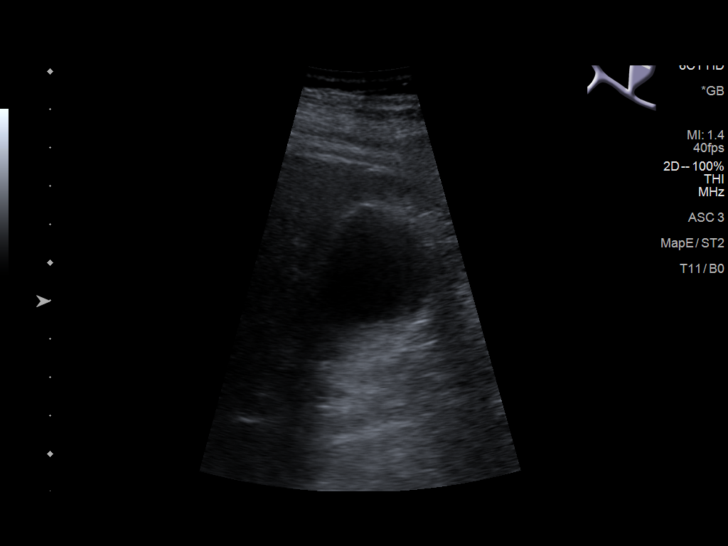
[im 20/58]
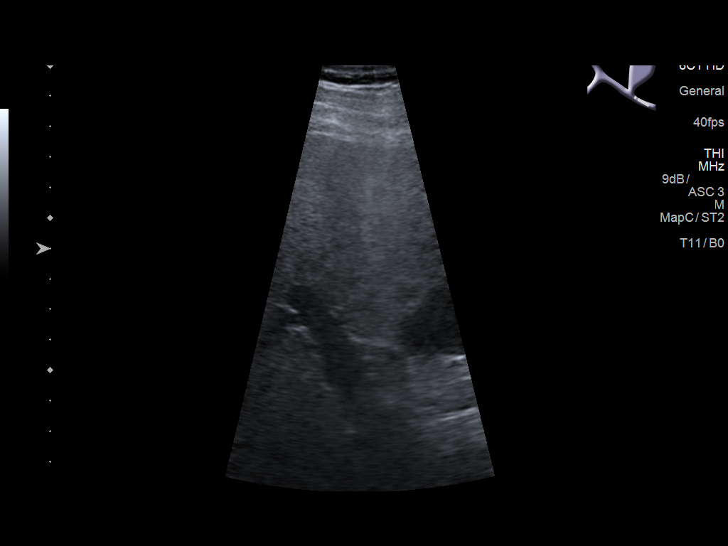
[im 22/58]
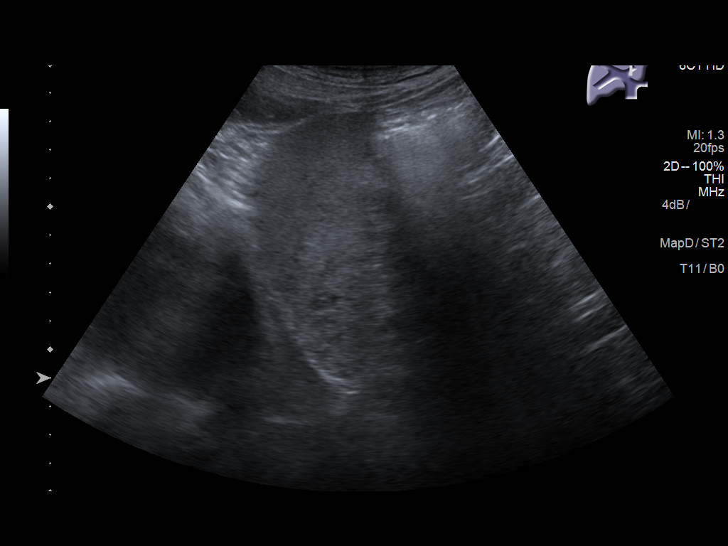
[im 27/58]
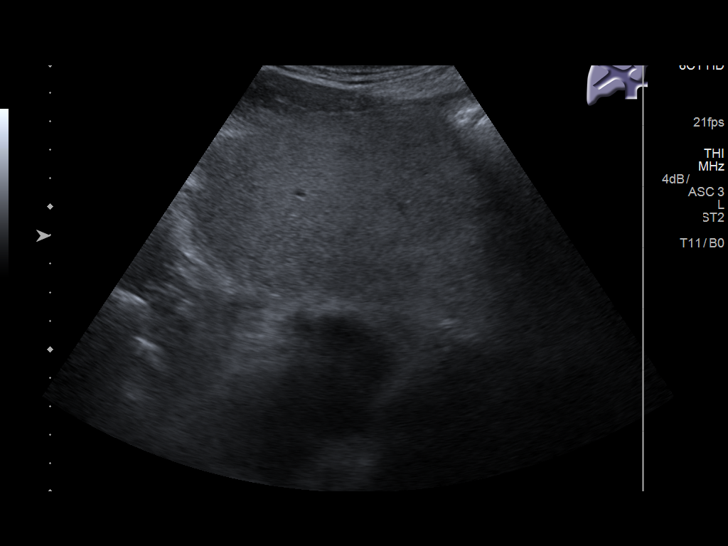
[im 31/58]
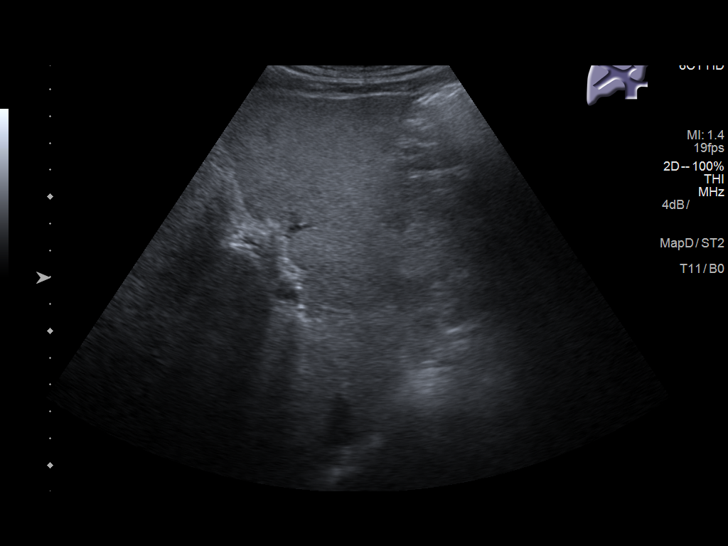
[im 36/58]
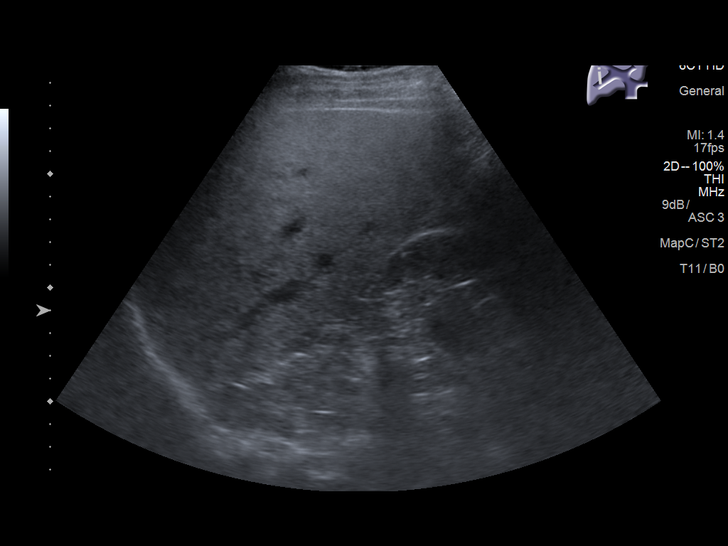
[im 39/58]
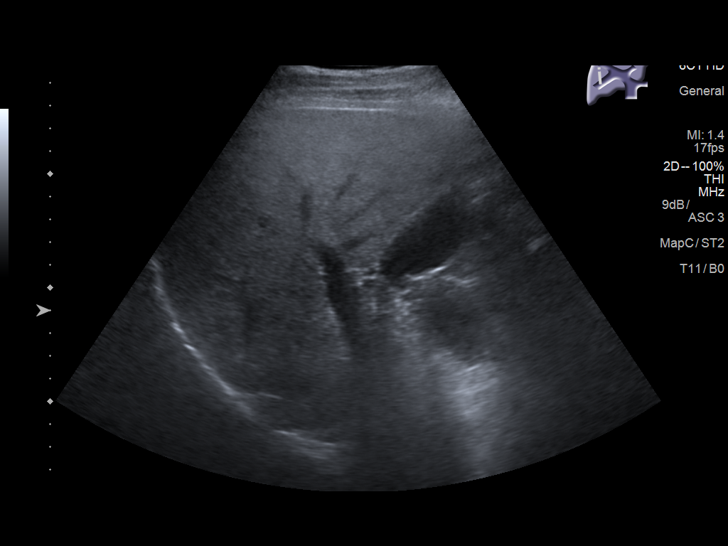
[im 43/58]
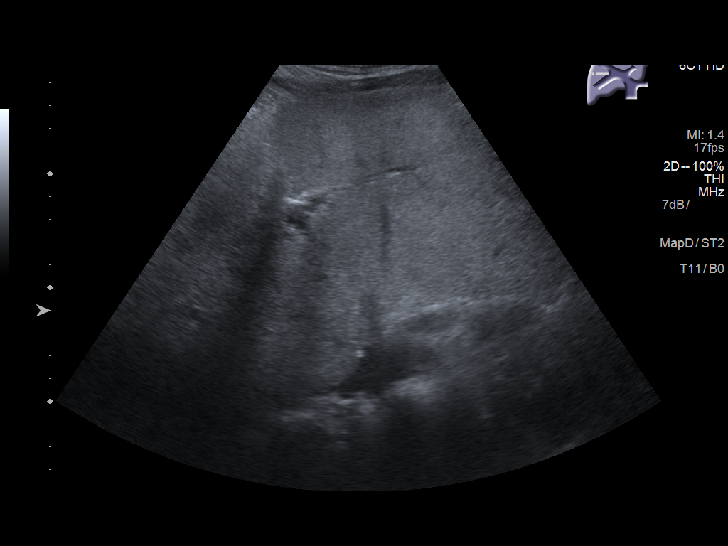
[im 48/58]
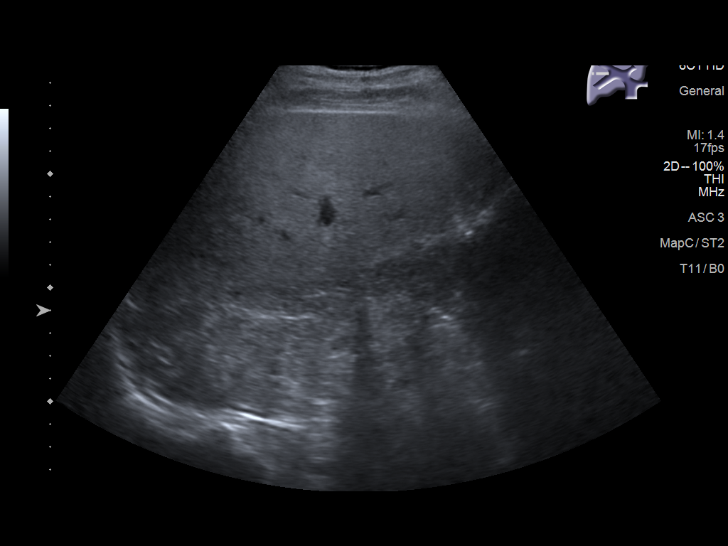
[im 53/58]
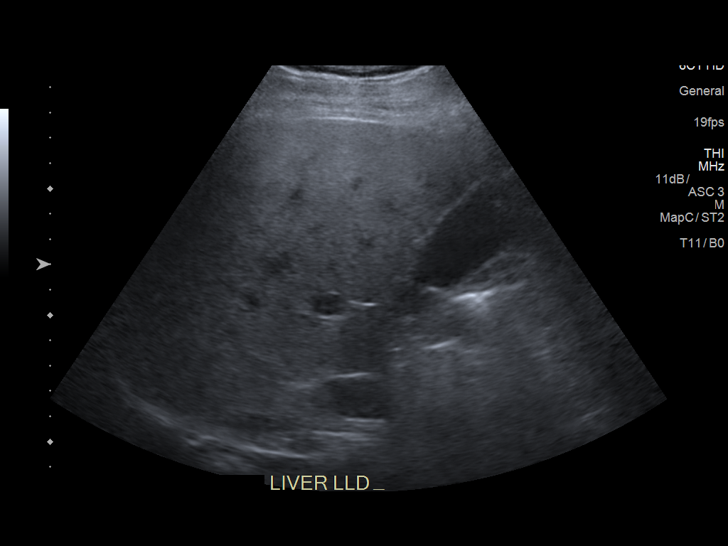
[im 58/58]
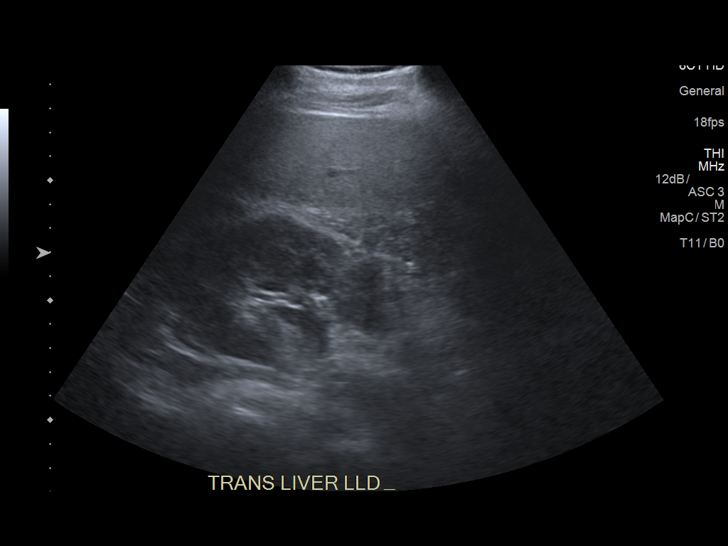

[14 of 25 positions shown; findings below may reference images not displayed]

FINDINGS: Gallbladder:

No gallstones or wall thickening visualized. No sonographic Murphy
sign noted by sonographer.

Common bile duct:

Diameter: 5.4 mm, unremarkable

Liver:

No focal lesion identified. Within normal limits in parenchymal
echogenicity. Portal vein is patent on color Doppler imaging with
normal direction of blood flow towards the liver.
IMPRESSION: Negative

## 2019-04-10 IMAGING — US US THYROID
1 series · 14 of 25 positions shown · non-contrast
Comparison: None.

CLINICAL DATA: Goiter, rightward tracheal deviation noted at the
thoracic inlet on radiography.

EXAM:
THYROID ULTRASOUND
TECHNIQUE: Ultrasound examination of the thyroid gland and adjacent soft
tissues was performed.

[Series 1: us thyroid · 0.08mm/px · 14 of 40 slices shown]
[im 1/40]
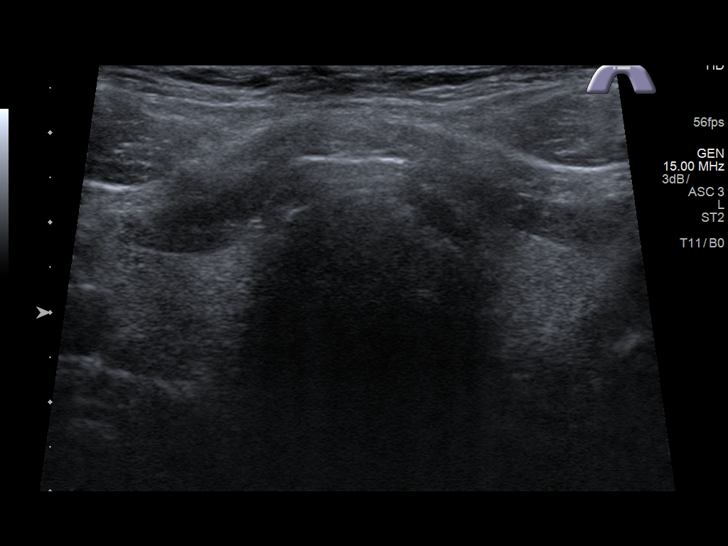
[im 4/40]
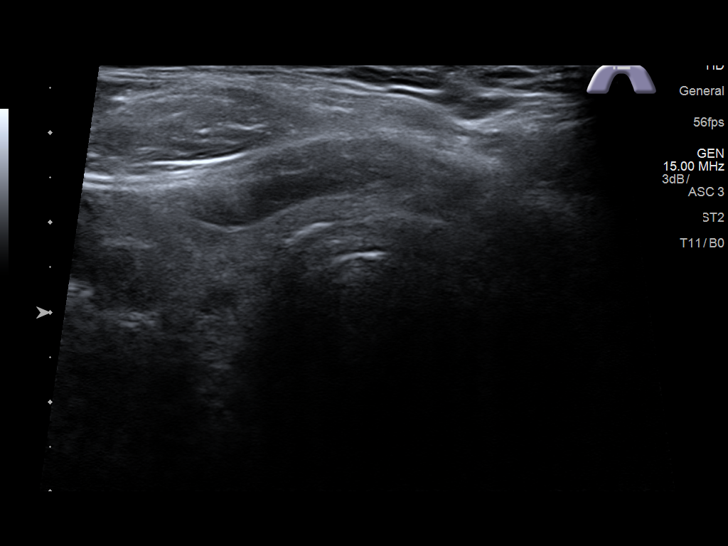
[im 7/40]
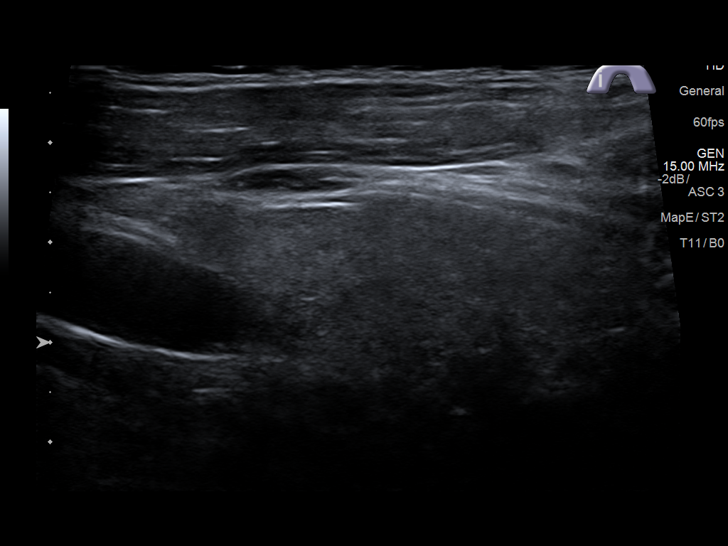
[im 10/40]
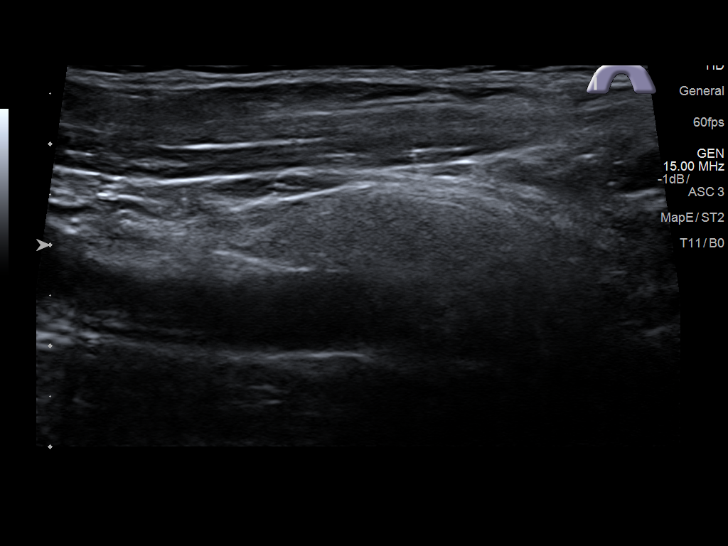
[im 14/40]
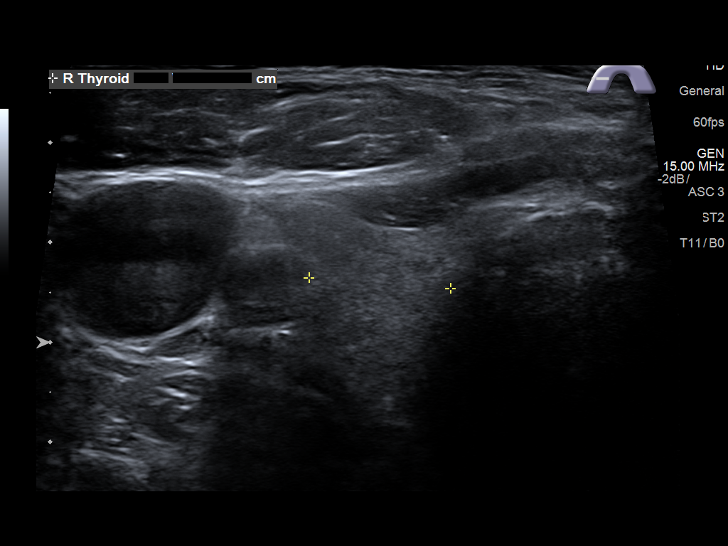
[im 15/40]
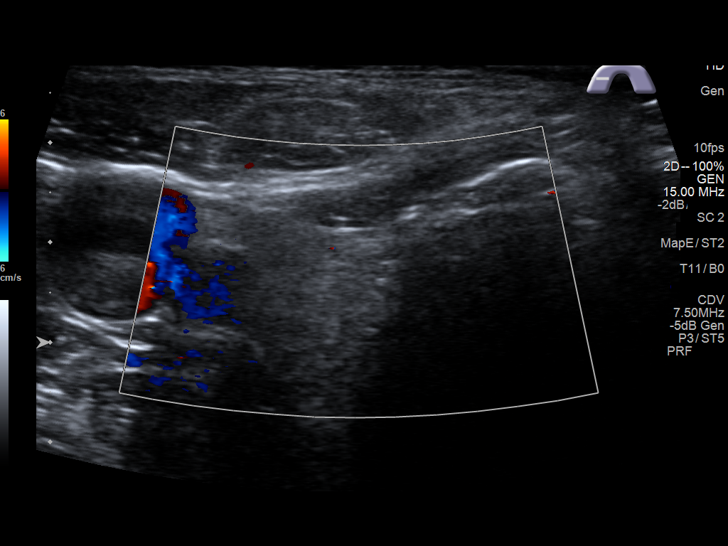
[im 18/40]
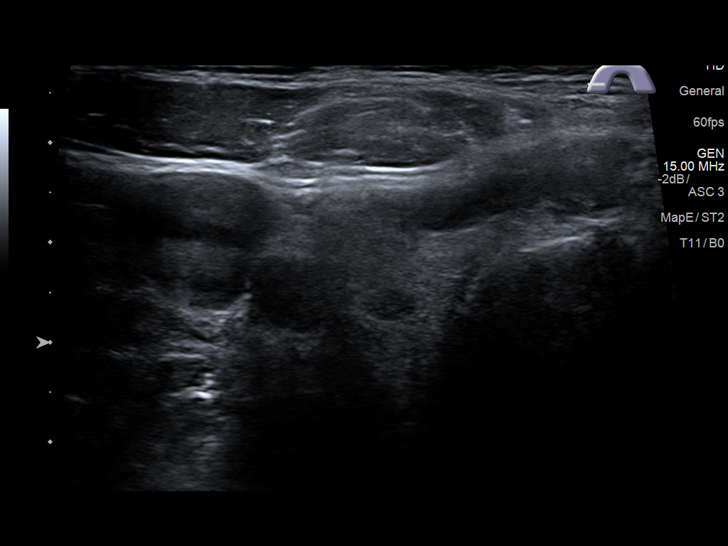
[im 22/40]
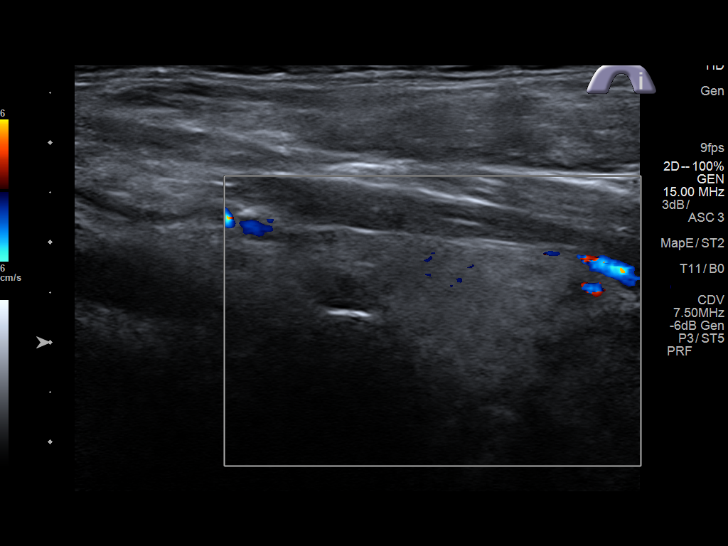
[im 25/40]
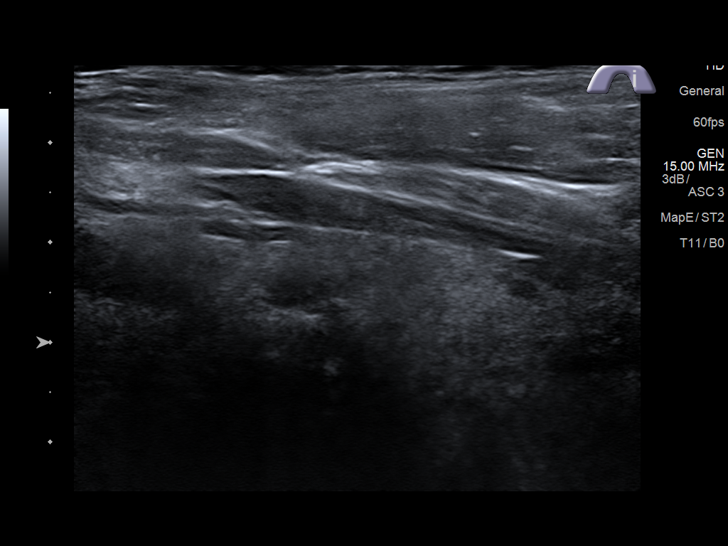
[im 27/40]
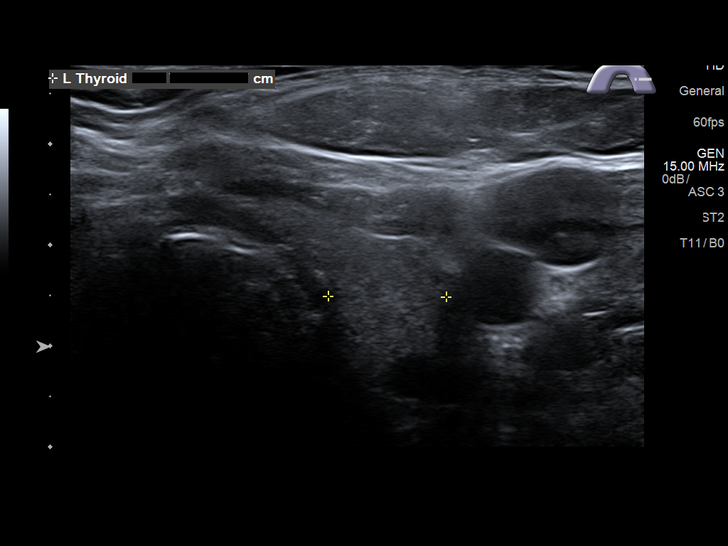
[im 30/40]
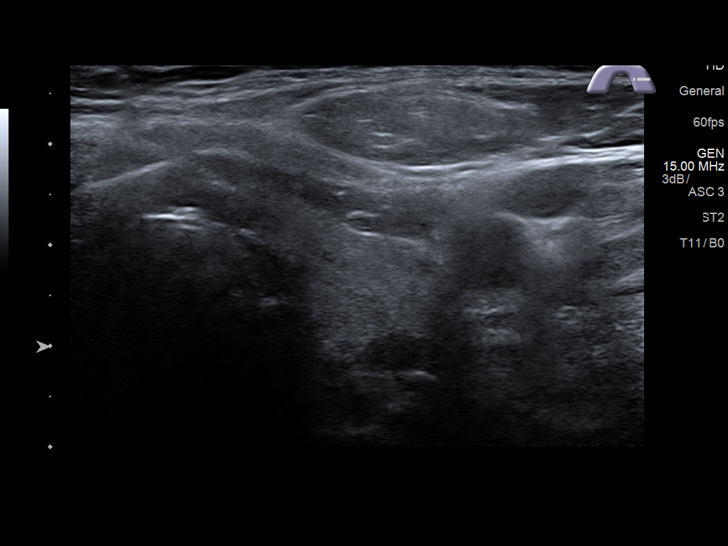
[im 33/40]
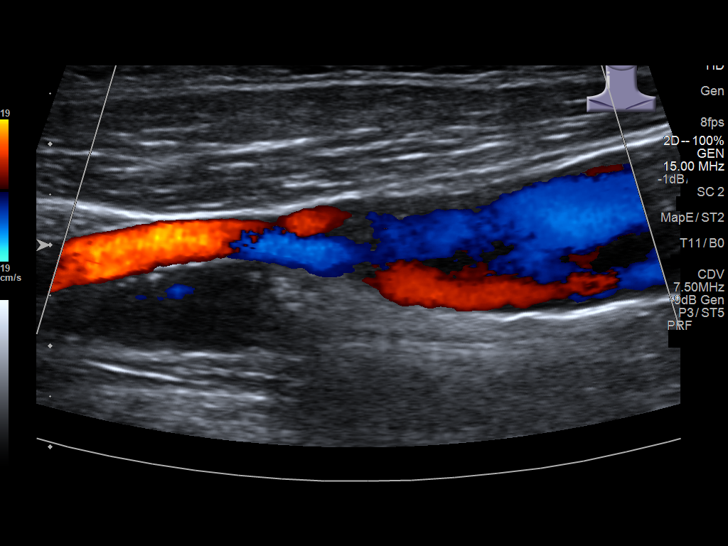
[im 36/40]
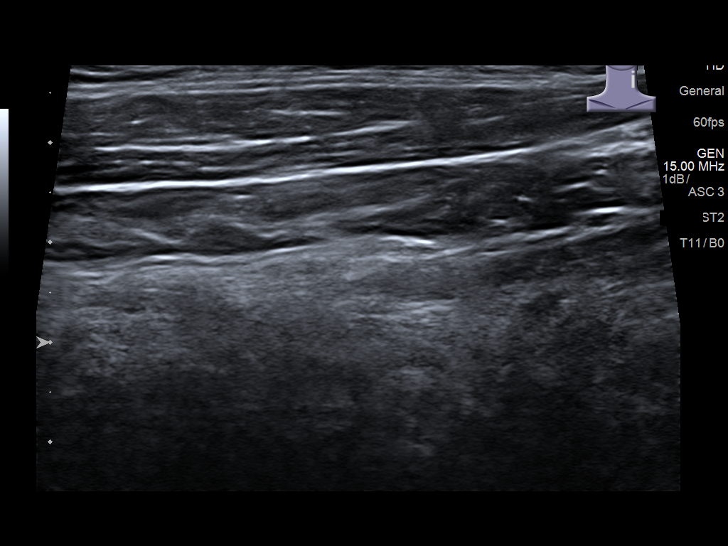
[im 40/40]
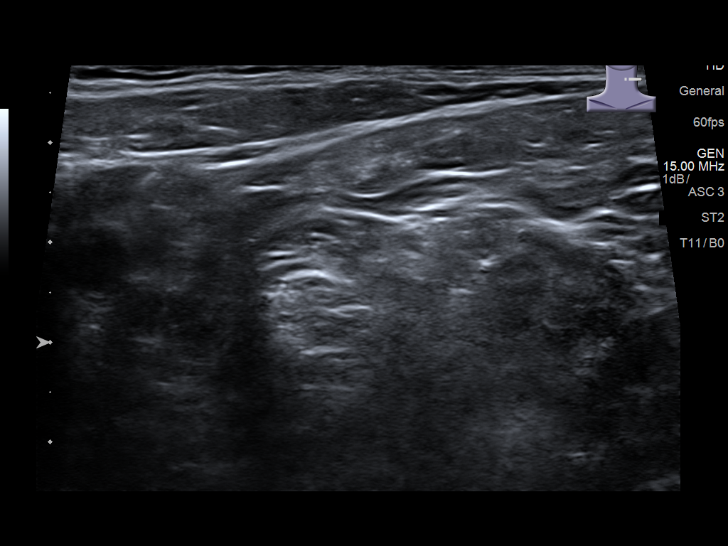

[14 of 25 positions shown; findings below may reference images not displayed]

FINDINGS: Parenchymal Echotexture: Mildly heterogenous

Isthmus: 0.3 cm thickness

Right lobe: 4.6 x 1.6 x 1.4 cm

Left lobe: 2.8 x 1.3 x 1.2 cm

_________________________________________________________

Estimated total number of nodules >/= 1 cm: 0

Number of spongiform nodules >/=  2 cm not described below (TR1): 0

Number of mixed cystic and solid nodules >/= 1.5 cm not described
below (TR2): 0

_________________________________________________________

No discrete nodules are seen within the thyroid gland. Scattered
normal sized regional cervical lymph nodes incidentally noted.
IMPRESSION: 1. Mild thyroid asymmetry without thyromegaly.  No nodule.

The above is in keeping with the ACR TI-RADS recommendations - [HOSPITAL] 9404;[DATE].

## 2019-04-17 DIAGNOSIS — Z96642 Presence of left artificial hip joint: Secondary | ICD-10-CM | POA: Diagnosis not present

## 2019-04-17 DIAGNOSIS — E1140 Type 2 diabetes mellitus with diabetic neuropathy, unspecified: Secondary | ICD-10-CM | POA: Diagnosis not present

## 2019-04-17 DIAGNOSIS — M545 Low back pain: Secondary | ICD-10-CM | POA: Diagnosis not present

## 2019-04-17 DIAGNOSIS — M9904 Segmental and somatic dysfunction of sacral region: Secondary | ICD-10-CM | POA: Diagnosis not present

## 2019-05-09 ENCOUNTER — Other Ambulatory Visit: Payer: Self-pay

## 2019-05-09 ENCOUNTER — Ambulatory Visit (INDEPENDENT_AMBULATORY_CARE_PROVIDER_SITE_OTHER): Payer: Medicare Other

## 2019-05-09 DIAGNOSIS — M545 Low back pain: Secondary | ICD-10-CM | POA: Diagnosis not present

## 2019-05-09 DIAGNOSIS — M48062 Spinal stenosis, lumbar region with neurogenic claudication: Secondary | ICD-10-CM | POA: Diagnosis not present

## 2019-05-09 DIAGNOSIS — Z23 Encounter for immunization: Secondary | ICD-10-CM

## 2019-05-09 DIAGNOSIS — M9973 Connective tissue and disc stenosis of intervertebral foramina of lumbar region: Secondary | ICD-10-CM | POA: Diagnosis not present

## 2019-05-09 DIAGNOSIS — M9904 Segmental and somatic dysfunction of sacral region: Secondary | ICD-10-CM | POA: Diagnosis not present

## 2019-05-09 DIAGNOSIS — M792 Neuralgia and neuritis, unspecified: Secondary | ICD-10-CM | POA: Diagnosis not present

## 2019-05-09 DIAGNOSIS — G8929 Other chronic pain: Secondary | ICD-10-CM | POA: Diagnosis not present

## 2019-05-09 DIAGNOSIS — Z5181 Encounter for therapeutic drug level monitoring: Secondary | ICD-10-CM | POA: Diagnosis not present

## 2019-05-09 DIAGNOSIS — E538 Deficiency of other specified B group vitamins: Secondary | ICD-10-CM | POA: Diagnosis not present

## 2019-05-09 DIAGNOSIS — M47897 Other spondylosis, lumbosacral region: Secondary | ICD-10-CM | POA: Diagnosis not present

## 2019-05-09 DIAGNOSIS — Z96642 Presence of left artificial hip joint: Secondary | ICD-10-CM | POA: Diagnosis not present

## 2019-05-09 MED ORDER — CYANOCOBALAMIN 1000 MCG/ML IJ SOLN
1000.0000 ug | Freq: Once | INTRAMUSCULAR | Status: AC
  Administered 2019-05-09: 1000 ug via INTRAMUSCULAR

## 2019-06-07 ENCOUNTER — Ambulatory Visit (INDEPENDENT_AMBULATORY_CARE_PROVIDER_SITE_OTHER): Payer: Medicare Other

## 2019-06-07 ENCOUNTER — Other Ambulatory Visit: Payer: Self-pay

## 2019-06-07 DIAGNOSIS — E538 Deficiency of other specified B group vitamins: Secondary | ICD-10-CM | POA: Diagnosis not present

## 2019-06-07 MED ORDER — CYANOCOBALAMIN 1000 MCG/ML IJ SOLN
1000.0000 ug | Freq: Once | INTRAMUSCULAR | Status: AC
  Administered 2019-06-07: 1000 ug via INTRAMUSCULAR

## 2019-06-20 ENCOUNTER — Ambulatory Visit: Payer: Medicare Other

## 2019-06-20 ENCOUNTER — Ambulatory Visit: Payer: Medicare Other | Admitting: Family Medicine

## 2019-06-23 ENCOUNTER — Ambulatory Visit (INDEPENDENT_AMBULATORY_CARE_PROVIDER_SITE_OTHER): Payer: Medicare Other

## 2019-06-23 ENCOUNTER — Other Ambulatory Visit: Payer: Self-pay

## 2019-06-23 VITALS — BP 120/79 | Temp 98.6°F | Ht 67.0 in | Wt 227.0 lb

## 2019-06-23 DIAGNOSIS — Z0000 Encounter for general adult medical examination without abnormal findings: Secondary | ICD-10-CM | POA: Diagnosis not present

## 2019-06-23 DIAGNOSIS — Z1211 Encounter for screening for malignant neoplasm of colon: Secondary | ICD-10-CM | POA: Diagnosis not present

## 2019-06-23 NOTE — Progress Notes (Signed)
Subjective:   Jimmy Green is a 68 y.o. male who presents for Medicare Annual preventive subsequent examination.  Virtual Visit via Telephone Note  I connected with [name] on 06/23/19 at  8:40 AM EST by telephone and verified that I am speaking with the correct person using two identifiers.  Medicare Annual Wellness visit completed telephonically due to Covid-19 pandemic.   Location: Patient: home Provider: office   I discussed the limitations, risks, security and privacy concerns of performing an evaluation and management service by telephone and the availability of in person appointments. The patient expressed understanding and agreed to proceed.  Some vital signs may be absent or patient reported.   Clemetine Marker, LPN     Review of Systems:   Cardiac Risk Factors include: advanced age (>2mn, >>51women);diabetes mellitus;dyslipidemia;male gender;hypertension;obesity (BMI >30kg/m2)     Objective:    Vitals: BP 120/79    Temp 98.6 F (37 C)    Ht '5\' 7"'  (1.702 m)    Wt 227 lb (103 kg)    BMI 35.55 kg/m   Body mass index is 35.55 kg/m.  Advanced Directives 06/23/2019 06/16/2018 05/21/2017 05/11/2017 04/06/2017 03/31/2017 02/01/2017  Does Patient Have a Medical Advance Directive? No No No No No No No  Does patient want to make changes to medical advance directive? No - Patient declined - - - - - -  Would patient like information on creating a medical advance directive? - Yes (MAU/Ambulatory/Procedural Areas - Information given) - - No - Patient declined - -  Pre-existing out of facility DNR order (yellow form or pink MOST form) - - - - - - -    Tobacco Social History   Tobacco Use  Smoking Status Former Smoker   Years: 40.00   Types: Cigarettes   Quit date: 08/03/2012   Years since quitting: 6.8  Smokeless Tobacco Never Used     Counseling given: Not Answered   Clinical Intake:  Pre-visit preparation completed: Yes  Pain : 0-10 Pain Score: 4    Pain Type: Chronic pain Pain Location: Back Pain Orientation: Lower Pain Descriptors / Indicators: Aching, Discomfort, Sore Pain Onset: More than a month ago Pain Frequency: Constant     BMI - recorded: 35.55 Nutritional Status: BMI > 30  Obese Nutritional Risks: None Diabetes: Yes CBG done?: No Did pt. bring in CBG monitor from home?: No   Nutrition Risk Assessment:  Has the patient had any N/V/D within the last 2 months?  No  Does the patient have any non-healing wounds?  No  Has the patient had any unintentional weight loss or weight gain?  No   Diabetes:  Is the patient diabetic?  Yes  If diabetic, was a CBG obtained today?  No  Did the patient bring in their glucometer from home?  No  How often do you monitor your CBG's? Every so often per patient.   Financial Strains and Diabetes Management:  Are you having any financial strains with the device, your supplies or your medication? No .  Does the patient want to be seen by Chronic Care Management for management of their diabetes?  No  Would the patient like to be referred to a Nutritionist or for Diabetic Management?  No   Diabetic Exams:  Diabetic Eye Exam: Completed 2018. Overdue for diabetic eye exam. Pt has been advised about the importance in completing this exam. Pt postponed appt due to Covid.   Diabetic Foot Exam: Completed 12/16/17. Pt has  been advised about the importance in completing this exam. Pt is scheduled for diabetic foot exam on 07/14/19.   How often do you need to have someone help you when you read instructions, pamphlets, or other written materials from your doctor or pharmacy?: 1 - Never  Interpreter Needed?: No  Information entered by :: Clemetine Marker LPN  Past Medical History:  Diagnosis Date   Anginal pain ([place])    Basal cell carcinoma    Coronary artery disease    Depression    Diabetes mellitus without complication ([place])    GERD (gastroesophageal reflux disease)     Hyperlipidemia    Hypertension    Hypogonadism in male    Metabolic syndrome    Myocardial infarction ([place])    Orthopnea    Seborrheic keratosis    Skin cancer 2014   nose and right knee   Sleep apnea    Squamous cell cancer of skin of left shoulder 01/2018   [facility]   Past Surgical History:  Procedure Laterality Date   BACK SURGERY     BASAL CELL CARCINOMA EXCISION Left 12/02/2015   Chest-Done by Dermatologist    BASAL CELL CARCINOMA EXCISION Left 02/21/2018   Nodulocytic pattern, deep margin involved - [facility] - Dr. Brendolyn Patty   BASAL CELL CARCINOMA EXCISION  02/23/2018   CARDIAC CATHETERIZATION Left 04/01/2016   Procedure: Left Heart Cath and Coronary Angiography;  Surgeon: Teodoro Spray, MD;  Location: [facility];  Service: Cardiovascular;  Laterality: Left;   CARDIAC CATHETERIZATION N/A 04/01/2016   Procedure: Intravascular Pressure Wire/FFR Study;  Surgeon: Isaias Cowman, MD;  Location: [facility];  Service: Cardiovascular;  Laterality: N/A;   COLONOSCOPY N/A 01/15/2015   Wohl-ileitis, 2 benign polyps, cryptitis, sigmoid diverticulosis, focal ulceration ICV   CORONARY STENT PLACEMENT     FRACTIONAL FLOW RESERVE WIRE  10/08/2011   Procedure: FRACTIONAL FLOW RESERVE WIRE;  Surgeon: Clent Demark, MD;  Location: [facility];  Service: Cardiovascular;;   HERNIA REPAIR     KNEE SURGERY     LEFT HEART CATHETERIZATION WITH CORONARY ANGIOGRAM N/A 10/08/2011   Procedure: LEFT HEART CATHETERIZATION WITH CORONARY ANGIOGRAM;  Surgeon: Clent Demark, MD;  Location: [facility];  Service: Cardiovascular;  Laterality: N/A;   SKIN CANCER EXCISION  01/2018   [facility]   TOTAL HIP ARTHROPLASTY Left 04/06/2017   Procedure: TOTAL HIP ARTHROPLASTY ANTERIOR APPROACH;  Surgeon: Hessie Knows, MD;  Location: [facility];  Service: Orthopedics;  Laterality: Left;   Family History  Problem Relation Age of Onset    Lung cancer Father    Heart disease Father    Skin cancer Father    Social History   Socioeconomic History   Marital status: Married    Spouse name: Thayer Headings   Number of children: 1   Years of education: Not on file   Highest education level: High school graduate  Occupational History   Occupation: retired  Scientist, product/process development strain: Not hard at [employer] insecurity    Worry: Never true    Inability: Never true   Transportation needs    Medical: No    Non-medical: No  Tobacco Use   Smoking status: Former Smoker    Years: 40.00    Types: Cigarettes    Quit date: 08/03/2012    Years since quitting: 6.8   Smokeless tobacco: Never Used  Substance and Sexual Activity   Alcohol use:  No    Alcohol/week: 0.0 standard drinks   Drug use: No   Sexual activity: Yes    Partners: Female    Birth control/protection: None  Lifestyle   Physical activity    Days per week: 0 days    Minutes per session: 0 min   Stress: To some extent  Relationships   Social connections    Talks on phone: Once a week    Gets together: Three times a week    Attends religious service: Never    Active member of club or organization: No    Attends meetings of clubs or organizations: Never    Relationship status: Married  Other Topics Concern   Not on file  Social History Narrative   Wife has 2 children by another marriage and he has 1 child by a previous marriage.    Outpatient Encounter Medications as of 06/23/2019  Medication Sig   amLODipine (NORVASC) 2.5 MG tablet Take 1 tablet (2.5 mg total) by mouth daily.   aspirin EC 81 MG tablet Take 81 mg by mouth every morning.    atorvastatin (LIPITOR) 40 MG tablet TAKE 1 TABLET (40 MG TOTAL) BY MOUTH DAILY.   bisacodyl (DULCOLAX) 10 MG suppository Place 1 suppository (10 mg total) rectally daily as needed for moderate constipation.   buPROPion (WELLBUTRIN XL) 150 MG 24 hr tablet TAKE 3 TABLETS BY MOUTH DAILY.    diclofenac sodium (VOLTAREN) 1 % GEL Apply topically.   docusate sodium (COLACE) 100 MG capsule Take 1 capsule (100 mg total) by mouth 2 (two) times daily.   escitalopram (LEXAPRO) 20 MG tablet Take 1 tablet (20 mg total) by mouth daily.   famotidine (PEPCID) 40 MG tablet Take 1 tablet (40 mg total) by mouth daily.   fluticasone (FLONASE) 50 MCG/ACT nasal spray Place 2 sprays into both nostrils daily.   glucose blood (ACCU-CHEK ACTIVE STRIPS) test strip Use as instructed   hydrochlorothiazide (HYDRODIURIL) 25 MG tablet Take 1 tablet (25 mg total) by mouth daily.   Lancets (ACCU-CHEK SOFT TOUCH) lancets Use as instructed   losartan (COZAAR) 100 MG tablet Take 0.5 tablets (50 mg total) by mouth daily.   metFORMIN (GLUCOPHAGE-XR) 500 MG 24 hr tablet Take 1 tablet (500 mg total) by mouth daily with breakfast.   metoprolol succinate (TOPROL-XL) 50 MG 24 hr tablet Take 1 tablet (50 mg total) by mouth every evening. Take with or immediately following a meal.   metroNIDAZOLE (METROGEL) 0.75 % gel    NARCAN 4 MG/0.1ML LIQD nasal spray kit    nitroGLYCERIN (NITROSTAT) 0.4 MG SL tablet DISSOLVE 1 TABLET UNDER THE TONGUE EVERY 5 MINUTES FOR UP TO 3 DOSES AS NEEDED FOR CHEST PAIN. IF NO RELIEF AFTER 3 DOSES, CALL 911 OR GO TO   omeprazole (PRILOSEC) 20 MG capsule Take 1 capsule (20 mg total) by mouth daily.   OVER THE COUNTER MEDICATION hylands restless legs   oxyCODONE (ROXICODONE) 15 MG immediate release tablet Take 1 tablet by mouth 5 (five) times daily as needed.   PREVIDENT 5000 SENSITIVE 1.1-5 % PSTE Apply 1 each topically 2 (two) times daily.   losartan-hydrochlorothiazide (HYZAAR) 100-12.5 MG tablet Take 1 tablet by mouth daily.   [DISCONTINUED] Oxycodone HCl 10 MG TABS Take 1 tablet by mouth 5 (five) times daily.   No facility-administered encounter medications on file as of 06/23/2019.     Activities of Daily Living In your present state of health, do you have any difficulty  performing the following  activities: 06/23/2019 02/08/2019  Hearing? N N  Comment declines hearing aids -  Vision? N N  Difficulty concentrating or making decisions? N N  Walking or climbing stairs? N N  Dressing or bathing? N N  Doing errands, shopping? N N  Preparing Food and eating ? N -  Using the Toilet? N -  In the past six months, have you accidently leaked urine? N -  Do you have problems with loss of bowel control? N -  Managing your Medications? N -  Managing your Finances? N -  Housekeeping or managing your Housekeeping? N -  Some recent data might be hidden    Patient Care Team: Steele Sizer, MD as PCP - General (Family Medicine) Steele Sizer, MD as Attending Physician (Family Medicine) Bary Castilla, Forest Gleason, MD (General Surgery) Hessie Knows, MD as Consulting Physician (Orthopedic Surgery) Teodoro Spray, MD as Consulting Physician (Cardiology) Mohammed Kindle, MD as Referring Physician (Pain Medicine) Brendolyn Patty, MD (Dermatology)   Assessment:   This is a routine wellness examination for Dvon.  Exercise Activities and Dietary recommendations Current Exercise Habits: The patient does not participate in regular exercise at present, Exercise limited by: orthopedic condition(s)  Goals     Increase physical activity     Recommend increase moderate physical activity to 30 minutes per day 3 days per week. Pt states he plans to join silver sneakers program at [facility]  06/23/2019 02/08/2019 09/16/2018 06/16/2018 05/20/2018  Falls in the past year? 0 0 0 0 No  Number falls in past yr: 0 0 - 0 -  Injury with Fall? 0 0 - - -  Risk for fall due to : Orthopedic patient - - - -  Follow up Falls prevention discussed - - - -   [place]:  Any stairs in or around the home? No  If so, do they handrails? No   Home free of loose throw rugs in walkways, pet beds, electrical cords, etc? Yes  Adequate lighting in  your home to reduce risk of falls? Yes   ASSISTIVE DEVICES UTILIZED TO PREVENT FALLS:  Life alert? No  Use of a cane, walker or w/c? No  Grab bars in the bathroom? No  Shower chair or bench in shower? Yes  Elevated toilet seat or a handicapped toilet? Yes   DME ORDERS:  DME order needed?  No   TIMED UP AND GO:  Was the test performed? No . Telephonic visit.   Education: Fall risk prevention has been discussed.  Intervention(s) required? No   Depression Screen PHQ 2/9 Scores 06/23/2019 02/08/2019 09/16/2018 06/16/2018  PHQ - 2 Score 1 0 0 6  PHQ- 9 Score 5 0 2 7    Cognitive Function - pt declined 6CIT for 2020 AWV.         Immunization History  Administered Date(s) Administered   Fluad Quad(high Dose 65+) 05/09/2019   Influenza, High Dose Seasonal PF 05/11/2017, 05/20/2018   Influenza,inj,Quad PF,6+ Mos 03/28/2015, 05/11/2016   Influenza-Unspecified 04/03/2014   Pneumococcal Conjugate-13 04/03/2014   Pneumococcal Polysaccharide-23 04/07/2017   Tdap 04/03/2014   Zoster 06/18/2014   Zoster Recombinat (Shingrix) 12/16/2017    Qualifies for Shingles Vaccine? Yes  Shingrix done  Tdap: Up to date  Flu Vaccine: Up to date  Pneumococcal Vaccine: Up to date   Screening Tests Health Maintenance  Topic Date Due   OPHTHALMOLOGY EXAM  03/22/2018  FOOT EXAM  12/17/2018   HEMOGLOBIN A1C  08/16/2019   COLONOSCOPY  01/15/2020   TETANUS/TDAP  04/03/2024   INFLUENZA VACCINE  Completed   Hepatitis C Screening  Completed   PNA vac Low Risk Adult  Completed   Cancer Screenings:  Colorectal Screening: Completed 01/15/15. Repeat every 5 years. Referral to GI placed today. Pt aware the office will call re: appt.  Lung Cancer Screening: (Low Dose CT Chest recommended if Age 49-80 years, 30 pack-year currently smoking OR have quit w/in 15years.) does not qualify.     Additional Screening:  Hepatiis C Screening: does qualify; Completed 06/07/15  Vision  Screening: Recommended annual ophthalmology exams for early detection of glaucoma and other disorders of the eye. Is the patient up to date with their annual eye exam?  No  Who is the provider or what is the name of the office in which the pt attends annual eye exams? [facility] Screening: Recommended annual dental exams for proper oral hygiene  Community Resource Referral:  CRR required this visit?  No       Plan:    I have personally reviewed and addressed the Medicare Annual Wellness questionnaire and have noted the following in the patients chart:  A. Medical and social history B. Use of alcohol, tobacco or illicit drugs  C. Current medications and supplements D. Functional ability and status E.  Nutritional status F.  Physical activity G. Advance directives H. List of other physicians I.  Hospitalizations, surgeries, and ER visits in previous 12 months J.  [place] such as hearing and vision if needed, cognitive and depression L. Referrals and appointments   In addition, I have reviewed and discussed with patient certain preventive protocols, quality metrics, and best practice recommendations. A written personalized care plan for preventive services as well as general preventive health recommendations were provided to patient.   Signed,  Clemetine Marker, LPN Nurse Health Advisor   Nurse Notes: pt c/o restless legs at night. He started taking and OTC Hyland's medication for restless legs given to him by his pharmacist but states it only helps a little and wants to know if there is any medication he can try. Advised pt to discuss with Dr. Ancil Boozer. Rescheduled his follow up appt for 07/14/19. Pt would also like to discuss testosterone and b12 levels as he doesn't feel like the monthly injection is helping much.

## 2019-06-23 NOTE — Patient Instructions (Signed)
Jimmy Green , Thank you for taking time to come for your Medicare Wellness Visit. I appreciate your ongoing commitment to your health goals. Please review the following plan we discussed and let me know if I can assist you in the future.   Screening recommendations/referrals: Colonoscopy: done 01/15/15. Repeat in 2021. Referral sent today. Recommended yearly ophthalmology/optometry visit for glaucoma screening and checkup Recommended yearly dental visit for hygiene and checkup  Vaccinations: Influenza vaccine: done 05/09/19 Pneumococcal vaccine: done 04/07/17 Tdap vaccine: done 04/03/14 Shingles vaccine: 1st Shingrx dose completed. Not advised to take the 2nd.     Advanced directives: Please bring a copy of your health care power of attorney and living will to the office at your convenience once you have completed those documents.   Conditions/risks identified: Recommend increasing physical activity as tolerated.   Next appointment: Please follow up in one year for your Medicare Annual Wellness visit.    Preventive Care 67 Years and Older, Male Preventive care refers to lifestyle choices and visits with your health care provider that can promote health and wellness. What does preventive care include?  A yearly physical exam. This is also called an annual well check.  Dental exams once or twice a year.  Routine eye exams. Ask your health care provider how often you should have your eyes checked.  Personal lifestyle choices, including:  Daily care of your teeth and gums.  Regular physical activity.  Eating a healthy diet.  Avoiding tobacco and drug use.  Limiting alcohol use.  Practicing safe sex.  Taking low doses of aspirin every day.  Taking vitamin and mineral supplements as recommended by your health care provider. What happens during an annual well check? The services and screenings done by your health care provider during your annual well check will depend on your age,  overall health, lifestyle risk factors, and family history of disease. Counseling  Your health care provider may ask you questions about your:  Alcohol use.  Tobacco use.  Drug use.  Emotional well-being.  Home and relationship well-being.  Sexual activity.  Eating habits.  History of falls.  Memory and ability to understand (cognition).  Work and work Statistician. Screening  You may have the following tests or measurements:  Height, weight, and BMI.  Blood pressure.  Lipid and cholesterol levels. These may be checked every 5 years, or more frequently if you are over 18 years old.  Skin check.  Lung cancer screening. You may have this screening every year starting at age 80 if you have a 30-pack-year history of smoking and currently smoke or have quit within the past 15 years.  Fecal occult blood test (FOBT) of the stool. You may have this test every year starting at age 23.  Flexible sigmoidoscopy or colonoscopy. You may have a sigmoidoscopy every 5 years or a colonoscopy every 10 years starting at age 22.  Prostate cancer screening. Recommendations will vary depending on your family history and other risks.  Hepatitis C blood test.  Hepatitis B blood test.  Sexually transmitted disease (STD) testing.  Diabetes screening. This is done by checking your blood sugar (glucose) after you have not eaten for a while (fasting). You may have this done every 1-3 years.  Abdominal aortic aneurysm (AAA) screening. You may need this if you are a current or former smoker.  Osteoporosis. You may be screened starting at age 33 if you are at high risk. Talk with your health care provider about your test results, treatment  options, and if necessary, the need for more tests. Vaccines  Your health care provider may recommend certain vaccines, such as:  Influenza vaccine. This is recommended every year.  Tetanus, diphtheria, and acellular pertussis (Tdap, Td) vaccine. You may  need a Td booster every 10 years.  Zoster vaccine. You may need this after age 37.  Pneumococcal 13-valent conjugate (PCV13) vaccine. One dose is recommended after age 69.  Pneumococcal polysaccharide (PPSV23) vaccine. One dose is recommended after age 28. Talk to your health care provider about which screenings and vaccines you need and how often you need them. This information is not intended to replace advice given to you by your health care provider. Make sure you discuss any questions you have with your health care provider. Document Released: 08/16/2015 Document Revised: 04/08/2016 Document Reviewed: 05/21/2015 Elsevier Interactive Patient Education  2017 [employer] Prevention in the Home Falls can cause injuries. They can happen to people of all ages. There are many things you can do to make your home safe and to help prevent falls. What can I do on the outside of my home?  Regularly fix the edges of walkways and driveways and fix any cracks.  Remove anything that might make you trip as you walk through a door, such as a raised step or threshold.  Trim any bushes or trees on the path to your home.  Use bright outdoor lighting.  Clear any walking paths of anything that might make someone trip, such as rocks or tools.  Regularly check to see if handrails are loose or broken. Make sure that both sides of any steps have handrails.  Any raised decks and porches should have guardrails on the edges.  Have any leaves, snow, or ice cleared regularly.  Use sand or salt on walking paths during winter.  Clean up any spills in your garage right away. This includes oil or grease spills. What can I do in the bathroom?  Use night lights.  Install grab bars by the toilet and in the tub and shower. Do not use towel bars as grab bars.  Use non-skid mats or decals in the tub or shower.  If you need to sit down in the shower, use a plastic, non-slip stool.  Keep the floor  dry. Clean up any water that spills on the floor as soon as it happens.  Remove soap buildup in the tub or shower regularly.  Attach bath mats securely with double-sided non-slip rug tape.  Do not have throw rugs and other things on the floor that can make you trip. What can I do in the bedroom?  Use night lights.  Make sure that you have a light by your bed that is easy to reach.  Do not use any sheets or blankets that are too big for your bed. They should not hang down onto the floor.  Have a firm chair that has side arms. You can use this for support while you get dressed.  Do not have throw rugs and other things on the floor that can make you trip. What can I do in the kitchen?  Clean up any spills right away.  Avoid walking on wet floors.  Keep items that you use a lot in easy-to-reach places.  If you need to reach something above you, use a strong step stool that has a grab bar.  Keep electrical cords out of the way.  Do not use floor polish or wax that makes floors slippery.  If you must use wax, use non-skid floor wax.  Do not have throw rugs and other things on the floor that can make you trip. What can I do with my stairs?  Do not leave any items on the stairs.  Make sure that there are handrails on both sides of the stairs and use them. Fix handrails that are broken or loose. Make sure that handrails are as long as the stairways.  Check any carpeting to make sure that it is firmly attached to the stairs. Fix any carpet that is loose or worn.  Avoid having throw rugs at the top or bottom of the stairs. If you do have throw rugs, attach them to the floor with carpet tape.  Make sure that you have a light switch at the top of the stairs and the bottom of the stairs. If you do not have them, ask someone to add them for you. What else can I do to help prevent falls?  Wear shoes that:  Do not have high heels.  Have rubber bottoms.  Are comfortable and fit you  well.  Are closed at the toe. Do not wear sandals.  If you use a stepladder:  Make sure that it is fully opened. Do not climb a closed stepladder.  Make sure that both sides of the stepladder are locked into place.  Ask someone to hold it for you, if possible.  Clearly mark and make sure that you can see:  Any grab bars or handrails.  First and last steps.  Where the edge of each step is.  Use tools that help you move around (mobility aids) if they are needed. These include:  Canes.  Walkers.  Scooters.  Crutches.  Turn on the lights when you go into a dark area. Replace any light bulbs as soon as they burn out.  Set up your furniture so you have a clear path. Avoid moving your furniture around.  If any of your floors are uneven, fix them.  If there are any pets around you, be aware of where they are.  Review your medicines with your doctor. Some medicines can make you feel dizzy. This can increase your chance of falling. Ask your doctor what other things that you can do to help prevent falls. This information is not intended to replace advice given to you by your health care provider. Make sure you discuss any questions you have with your health care provider. Document Released: 05/16/2009 Document Revised: 12/26/2015 Document Reviewed: 08/24/2014 Elsevier Interactive Patient Education  2017 [employer].

## 2019-06-27 ENCOUNTER — Telehealth: Payer: Self-pay

## 2019-06-27 NOTE — Telephone Encounter (Signed)
Left message with patients wife to let her husband know that his colonoscopy is not due until 06/21, and we will call him back closer to the date of his due time to schedule.  Thanks [employer]

## 2019-07-04 DIAGNOSIS — M545 Low back pain: Secondary | ICD-10-CM | POA: Diagnosis not present

## 2019-07-04 DIAGNOSIS — M5136 Other intervertebral disc degeneration, lumbar region: Secondary | ICD-10-CM | POA: Diagnosis not present

## 2019-07-04 DIAGNOSIS — M169 Osteoarthritis of hip, unspecified: Secondary | ICD-10-CM | POA: Diagnosis not present

## 2019-07-04 DIAGNOSIS — M9904 Segmental and somatic dysfunction of sacral region: Secondary | ICD-10-CM | POA: Diagnosis not present

## 2019-07-11 ENCOUNTER — Other Ambulatory Visit: Payer: Self-pay

## 2019-07-11 ENCOUNTER — Ambulatory Visit (INDEPENDENT_AMBULATORY_CARE_PROVIDER_SITE_OTHER): Payer: Medicare Other

## 2019-07-11 DIAGNOSIS — E538 Deficiency of other specified B group vitamins: Secondary | ICD-10-CM

## 2019-07-11 MED ORDER — CYANOCOBALAMIN 1000 MCG/ML IJ SOLN
1000.0000 ug | Freq: Once | INTRAMUSCULAR | Status: AC
  Administered 2019-07-11: 1000 ug via INTRAMUSCULAR

## 2019-07-14 ENCOUNTER — Other Ambulatory Visit: Payer: Self-pay

## 2019-07-14 ENCOUNTER — Encounter: Payer: Self-pay | Admitting: Family Medicine

## 2019-07-14 ENCOUNTER — Ambulatory Visit (INDEPENDENT_AMBULATORY_CARE_PROVIDER_SITE_OTHER): Payer: Medicare Other | Admitting: Family Medicine

## 2019-07-14 VITALS — BP 130/82 | HR 67 | Temp 97.8°F | Resp 16 | Ht 67.0 in | Wt 225.3 lb

## 2019-07-14 DIAGNOSIS — N138 Other obstructive and reflux uropathy: Secondary | ICD-10-CM | POA: Diagnosis not present

## 2019-07-14 DIAGNOSIS — K219 Gastro-esophageal reflux disease without esophagitis: Secondary | ICD-10-CM | POA: Diagnosis not present

## 2019-07-14 DIAGNOSIS — I10 Essential (primary) hypertension: Secondary | ICD-10-CM | POA: Diagnosis not present

## 2019-07-14 DIAGNOSIS — G2581 Restless legs syndrome: Secondary | ICD-10-CM

## 2019-07-14 DIAGNOSIS — I2510 Atherosclerotic heart disease of native coronary artery without angina pectoris: Secondary | ICD-10-CM

## 2019-07-14 DIAGNOSIS — I200 Unstable angina: Secondary | ICD-10-CM | POA: Diagnosis not present

## 2019-07-14 DIAGNOSIS — E538 Deficiency of other specified B group vitamins: Secondary | ICD-10-CM | POA: Diagnosis not present

## 2019-07-14 DIAGNOSIS — N401 Enlarged prostate with lower urinary tract symptoms: Secondary | ICD-10-CM

## 2019-07-14 DIAGNOSIS — E785 Hyperlipidemia, unspecified: Secondary | ICD-10-CM | POA: Diagnosis not present

## 2019-07-14 DIAGNOSIS — F330 Major depressive disorder, recurrent, mild: Secondary | ICD-10-CM

## 2019-07-14 DIAGNOSIS — E782 Mixed hyperlipidemia: Secondary | ICD-10-CM

## 2019-07-14 DIAGNOSIS — E1169 Type 2 diabetes mellitus with other specified complication: Secondary | ICD-10-CM

## 2019-07-14 MED ORDER — OMEPRAZOLE 20 MG PO CPDR: 20.0000 mg | DELAYED_RELEASE_CAPSULE | Freq: Every day | ORAL | 1 refills | Status: DC

## 2019-07-14 MED ORDER — METFORMIN HCL ER 500 MG PO TB24: 500.0000 mg | ORAL_TABLET | Freq: Every day | ORAL | 1 refills | Status: DC

## 2019-07-14 MED ORDER — LOSARTAN POTASSIUM-HCTZ 100-12.5 MG PO TABS: 1.0000 | ORAL_TABLET | Freq: Every day | ORAL | 1 refills | Status: DC

## 2019-07-14 MED ORDER — AMLODIPINE BESYLATE 2.5 MG PO TABS: 2.5000 mg | ORAL_TABLET | Freq: Every day | ORAL | 1 refills | Status: DC

## 2019-07-14 MED ORDER — GABAPENTIN 100 MG PO CAPS: 100.0000 mg | ORAL_CAPSULE | Freq: Every day | ORAL | 0 refills | Status: DC

## 2019-07-14 MED ORDER — METOPROLOL SUCCINATE ER 50 MG PO TB24: 50.0000 mg | ORAL_TABLET | Freq: Every evening | ORAL | 1 refills | Status: DC

## 2019-07-14 MED ORDER — ESCITALOPRAM OXALATE 20 MG PO TABS: 20.0000 mg | ORAL_TABLET | Freq: Every day | ORAL | 1 refills | Status: DC

## 2019-07-14 NOTE — Progress Notes (Signed)
Name: Jimmy Green   MRN: [id number]    DOB: 14-Jul-1951   Date:07/14/2019       Progress Note  Subjective  Chief Complaint  Chief Complaint  Patient presents with  . Depression  . Diabetes    A1c needs to be done at lab. Internal error. Foot exam due  . Hypertension  . Dyslipidemia  . Testosterone    He wants his testosterone checked. He feels fatigued.    HPI  Depression Major:taking medication daily, no suicidal thoughts or ideation,he is currently in remission. PHq9is 1 today and doing well He asked about testosterone supplementation but discussed risk and benefits and he states he does not want to have it tested now  RLS: he states that his legs have an urge to move when sitting still or at night when he goes to bed, he states it only stops when he gets up and moves,  he tried something otc but is not helping   GERD/Cough: he was having severe symptoms earlier this year,he states doing well on Omeprazole, stopped Pepcid   Dyslipidemia with type II DM: he denies polyphagia, polydipsia or polyuria. His last A1C was 6.9%  He continues to try to eat healthier, eating a lot of vegetables. He has been [place] daily, ARBand statin therapy.Eye exam is due, advised him to call eye doctor.Taking metforminas recommended, denies side effects.He states when he checks his glucose is around 130's fasting   HTN:  she has been taking medication daily. BP not checked at home, but at goal here today   Hyperlipidemia: taking Atorvastatin and denies side effects. No myalgia.Reviewed labs     OSA: he got a CPAP mask was not fitting properly and causing right forehead pain , ordered by Dr. Jacqualine Code stopped using it.Explained that morning fatigue may be secondary to not wearing CPAP   History of basal cell carcinoma: Seeing Dermatologist, Dr. Matthias Hughs 6 months. He stopped using Metrogel. He missed follow up because of COVID-19, advised to follow up in the Spring    CAD: taking aspirin, statin and beta blocker and ARB,and he has NTG in his pocket, but has not used it yet.. Nochest pain or decrease in exercise tolerance. He was seen by Dr. Ubaldo Glassing in01/2019, and had another heart cath that showed no significant disease, 55-70% stenosis on the LAD , on medical management.he has not been back lately   Left hip replacement history :under the care of Dr. Primus Bravo on pain medication and pain today is3/10, still under the care of pain clinic, symptoms usually present during activityUnchanged   BPH: we will recheck PSA    Patient Active Problem List   Diagnosis Date Noted  . BPH with obstruction/lower urinary tract symptoms 05/20/2018  . Basal cell carcinoma 03/28/2018  . History of basal cell carcinoma 03/23/2018  . Primary localized osteoarthritis of left hip 04/06/2017  . Dyslipidemia associated with type 2 diabetes mellitus ([place]) 10/04/2016  . Trigger thumb of both thumbs 06/02/2016  . Chronic tension-type headache, intractable 09/25/2015  . Benign paroxysmal positional vertigo due to bilateral vestibular disorder 09/25/2015  . Hearing loss sensory, bilateral 08/26/2015  . Cerebral microvascular disease 08/26/2015  . Metabolic syndrome 88/50/2774  . Vertigo 06/21/2015  . Buzzing in ear 06/21/2015  . Hypertension goal BP (blood pressure) < 140/90 03/28/2015  . GERD (gastroesophageal reflux disease) 03/28/2015  . Hyperlipidemia 03/28/2015  . Depression, major 03/28/2015  . CAD in native artery 03/28/2015  . Actinic keratosis 02/14/2015  . Obstructive sleep apnea  02/03/2015  . Diverticulosis of colon without hemorrhage 01/31/2015  . Hx of colonic polyps   . Benign neoplasm of descending colon   . Benign neoplasm of sigmoid colon   . Idiopathic colitis   . Sebaceous cyst 09/14/2014    Past Surgical History:  Procedure Laterality Date  . BACK SURGERY    . BASAL CELL CARCINOMA EXCISION Left 12/02/2015   Chest-Done by Dermatologist   .  BASAL CELL CARCINOMA EXCISION Left 02/21/2018   Nodulocytic pattern, deep margin involved - [facility] - Dr. Brendolyn Patty  . BASAL CELL CARCINOMA EXCISION  02/23/2018  . CARDIAC CATHETERIZATION Left 04/01/2016   Procedure: Left Heart Cath and Coronary Angiography;  Surgeon: Teodoro Spray, MD;  Location: [place] [facility];  Service: Cardiovascular;  Laterality: Left;  . CARDIAC CATHETERIZATION N/A 04/01/2016   Procedure: Intravascular Pressure Wire/FFR Study;  Surgeon: Isaias Cowman, MD;  Location: [place] [facility];  Service: Cardiovascular;  Laterality: N/A;  . COLONOSCOPY N/A 01/15/2015   Wohl-ileitis, 2 benign polyps, cryptitis, sigmoid diverticulosis, focal ulceration ICV  . CORONARY STENT PLACEMENT    . FRACTIONAL FLOW RESERVE WIRE  10/08/2011   Procedure: FRACTIONAL FLOW RESERVE WIRE;  Surgeon: Clent Demark, MD;  Location[facility];  Service: Cardiovascular;;  . HERNIA REPAIR    . KNEE SURGERY    . LEFT HEART CATHETERIZATION WITH CORONARY ANGIOGRAM N/A 10/08/2011   Procedure: LEFT HEART CATHETERIZATION WITH CORONARY ANGIOGRAM;  Surgeon: Clent Demark, MD;  Location: [place] CATH LAB;  Service: Cardiovascular;  Laterality: N/A;  . SKIN CANCER EXCISION  01/2018    Dermatology  . TOTAL HIP ARTHROPLASTY Left 04/06/2017   Procedure: TOTAL HIP ARTHROPLASTY ANTERIOR APPROACH;  Surgeon: Hessie Knows, MD;  Location: [facility] ORS;  Service: Orthopedics;  Laterality: Left;    Family History  Problem Relation Age of Onset  . Lung cancer Father   . Heart disease Father   . Skin cancer Father     Social History   Socioeconomic History  . Marital status: Married    Spouse name: Thayer Headings  . Number of children: 1  . Years of education: Not on file  . Highest education level: High school graduate  Occupational History  . Occupation: retired  Tobacco Use  . Smoking status: Former Smoker    Years: 40.00    Types: Cigarettes    Quit date: 08/03/2012    Years since  quitting: 6.9  . Smokeless tobacco: Never Used  Substance and Sexual Activity  . Alcohol use: No    Alcohol/week: 0.0 standard drinks  . Drug use: No  . Sexual activity: Yes    Partners: Female    Birth control/protection: None  Other Topics Concern  . Not on file  Social History Narrative   Wife has 2 children by another marriage and he has 1 child by a previous marriage.   Social Determinants of Health   Financial Resource Strain:   . Difficulty of Paying Living Expenses: Not on file  Food Insecurity:   . Worried About Charity fundraiser in the Last Year: Not on file  . Ran Out of Food in the Last Year: Not on file  Transportation Needs:   . Lack of Transportation (Medical): Not on file  . Lack of Transportation (Non-Medical): Not on file  Physical Activity:   . Days of Exercise per Week: Not on file  . Minutes of Exercise per Session: Not on file  Stress:   .  Feeling of Stress : Not on file  Social Connections: Unknown  . Frequency of Communication with Friends and Family: Not on file  . Frequency of Social Gatherings with Friends and Family: Not on file  . Attends Religious Services: Not on file  . Active Member of Clubs or Organizations: Not on file  . Attends Archivist Meetings: Not on file  . Marital Status: Married  Human resources officer Violence:   . Fear of Current or Ex-Partner: Not on file  . Emotionally Abused: Not on file  . Physically Abused: Not on file  . Sexually Abused: Not on file     Current Outpatient Medications:  .  amLODipine (NORVASC) 2.5 MG tablet, Take 1 tablet (2.5 mg total) by mouth daily., Disp: 90 tablet, Rfl: 1 .  aspirin EC 81 MG tablet, Take 81 mg by mouth every morning. , Disp: , Rfl:  .  atorvastatin (LIPITOR) 40 MG tablet, TAKE 1 TABLET (40 MG TOTAL) BY MOUTH DAILY., Disp: 90 tablet, Rfl: 1 .  buPROPion (WELLBUTRIN XL) 150 MG 24 hr tablet, TAKE 3 TABLETS BY MOUTH DAILY., Disp: 270 tablet, Rfl: 1 .  diclofenac sodium  (VOLTAREN) 1 % GEL, Apply topically., Disp: , Rfl:  .  escitalopram (LEXAPRO) 20 MG tablet, Take 1 tablet (20 mg total) by mouth daily., Disp: 90 tablet, Rfl: 1 .  fluticasone (FLONASE) 50 MCG/ACT nasal spray, Place 2 sprays into both nostrils daily., Disp: 16 g, Rfl: 0 .  glucose blood (ACCU-CHEK ACTIVE STRIPS) test strip, Use as instructed, Disp: 100 each, Rfl: 12 .  Lancets (ACCU-CHEK SOFT TOUCH) lancets, Use as instructed, Disp: 100 each, Rfl: 12 .  metFORMIN (GLUCOPHAGE-XR) 500 MG 24 hr tablet, Take 1 tablet (500 mg total) by mouth daily with breakfast., Disp: 90 tablet, Rfl: 1 .  metoprolol succinate (TOPROL-XL) 50 MG 24 hr tablet, Take 1 tablet (50 mg total) by mouth every evening. Take with or immediately following a meal., Disp: 90 tablet, Rfl: 1 .  NARCAN 4 MG/0.1ML LIQD nasal spray kit, , Disp: , Rfl: 0 .  nitroGLYCERIN (NITROSTAT) 0.4 MG SL tablet, DISSOLVE 1 TABLET UNDER THE TONGUE EVERY 5 MINUTES FOR UP TO 3 DOSES AS NEEDED FOR CHEST PAIN. IF NO RELIEF AFTER 3 DOSES, CALL 911 OR GO TO, Disp: 25 tablet, Rfl: 0 .  omeprazole (PRILOSEC) 20 MG capsule, Take 1 capsule (20 mg total) by mouth daily., Disp: 90 capsule, Rfl: 1 .  OVER THE COUNTER MEDICATION, hylands restless legs, Disp: , Rfl:  .  oxyCODONE (ROXICODONE) 15 MG immediate release tablet, Take 1 tablet by mouth 5 (five) times daily as needed., Disp: , Rfl:  .  PREVIDENT 5000 SENSITIVE 1.1-5 % PSTE, Apply 1 each topically 2 (two) times daily., Disp: , Rfl: 5 .  gabapentin (NEURONTIN) 100 MG capsule, Take 1-3 capsules (100-300 mg total) by mouth at bedtime., Disp: 90 capsule, Rfl: 0 .  losartan-hydrochlorothiazide (HYZAAR) 100-12.5 MG tablet, Take 1 tablet by mouth daily., Disp: 90 tablet, Rfl: 1  Allergies  Allergen Reactions  . Elavil [Amitriptyline Hcl] Rash  . Tape Rash    Paper Tape    I personally reviewed active problem list, medication list, allergies, family history, social history, health maintenance with the  patient/caregiver today.   ROS  Constitutional: Negative for fever or weight change.  Respiratory: Negative for cough and shortness of breath.   Cardiovascular: Negative for chest pain or palpitations.  Gastrointestinal: Negative for abdominal pain, no bowel changes.  Musculoskeletal: Negative  for gait problem or joint swelling.  Skin: Negative for rash.  Neurological: Negative for dizziness or headache.  No other specific complaints in a complete review of systems (except as listed in HPI above).   Objective  Vitals:   07/14/19 0817  BP: 130/82  Pulse: 67  Resp: 16  Temp: 97.8 F (36.6 C)  TempSrc: Temporal  SpO2: 96%  Weight: 225 lb 4.8 oz (102.2 kg)  Height: '5\' 7"'  (1.702 m)      Body mass index is 35.29 kg/m.  Physical Exam  Constitutional: Patient appears well-developed and well-nourished. Obese No distress.  HEENT: head atraumatic, normocephalic, pupils equal and reactive to light Cardiovascular: Normal rate, regular rhythm and normal heart sounds.  No murmur heard. No BLE edema. Pulmonary/Chest: Effort normal and breath sounds normal. No respiratory distress. Abdominal: Soft.  There is no tenderness. Psychiatric: Patient has a normal mood and affect. behavior is normal. Judgment and thought content normal.   Diabetic Foot Exam: Diabetic Foot Exam - Simple   Simple Foot Form Visual Inspection See comments: Yes Sensation Testing Intact to touch and monofilament testing bilaterally: Yes Pulse Check Posterior Tibialis and Dorsalis pulse intact bilaterally: Yes Comments Callus formation       PHQ2/9: Depression screen [facility] 2/9 07/14/2019 06/23/2019 02/08/2019 09/16/2018 06/16/2018  Decreased Interest 0 1 0 0 3  Down, Depressed, Hopeless 0 0 0 0 3  PHQ - 2 Score 0 1 0 0 6  Altered sleeping 0 1 0 1 0  Tired, decreased energy 2 3 0 1 1  Change in appetite 0 0 0 0 0  Feeling bad or failure about yourself  0 0 0 0 0  Trouble concentrating 0 0 0 0 0  Moving  slowly or fidgety/restless 0 0 0 0 0  Suicidal thoughts 0 0 0 0 0  PHQ-9 Score 2 5 0 2 7  Difficult doing work/chores Somewhat difficult Not difficult at all - Not difficult at all Somewhat difficult  Some recent data might be hidden    phq 9 is negative   Fall Risk: Fall Risk  06/23/2019 02/08/2019 09/16/2018 06/16/2018 05/20/2018  Falls in the past year? 0 0 0 0 No  Number falls in past yr: 0 0 - 0 -  Injury with Fall? 0 0 - - -  Risk for fall due to : Orthopedic patient - - - -  Follow up Falls prevention discussed - - - -     Assessment & Plan  1. Dyslipidemia associated with type 2 diabetes mellitus (HCC)  - POCT HgB A1C - Hemoglobin A1c - metFORMIN (GLUCOPHAGE-XR) 500 MG 24 hr tablet; Take 1 tablet (500 mg total) by mouth daily with breakfast.  Dispense: 90 tablet; Refill: 1  2. B12 deficiency  contniue supplementation   3. Hypertension, benign  - metoprolol succinate (TOPROL-XL) 50 MG 24 hr tablet; Take 1 tablet (50 mg total) by mouth every evening. Take with or immediately following a meal.  Dispense: 90 tablet; Refill: 1 - losartan-hydrochlorothiazide (HYZAAR) 100-12.5 MG tablet; Take 1 tablet by mouth daily.  Dispense: 90 tablet; Refill: 1 - amLODipine (NORVASC) 2.5 MG tablet; Take 1 tablet (2.5 mg total) by mouth daily.  Dispense: 90 tablet; Refill: 1  4. Gastroesophageal reflux disease without esophagitis  - omeprazole (PRILOSEC) 20 MG capsule; Take 1 capsule (20 mg total) by mouth daily.  Dispense: 90 capsule; Refill: 1  5. Unstable angina ([place])   6. BPH with obstruction/lower urinary tract symptoms  - PSA  7. CAD in native artery  - metoprolol succinate (TOPROL-XL) 50 MG 24 hr tablet; Take 1 tablet (50 mg total) by mouth every evening. Take with or immediately following a meal.  Dispense: 90 tablet; Refill: 1  8. Depression, major, recurrent, mild (HCC)  - escitalopram (LEXAPRO) 20 MG tablet; Take 1 tablet (20 mg total) by mouth daily.  Dispense: 90  tablet; Refill: 1  9. Mixed hyperlipidemia   10. RLS (restless legs syndrome)  - gabapentin (NEURONTIN) 100 MG capsule; Take 1-3 capsules (100-300 mg total) by mouth at bedtime.  Dispense: 90 capsule; Refill: 0

## 2019-07-15 LAB — HEMOGLOBIN A1C
Hgb A1c MFr Bld: 7.2 % of total Hgb — ABNORMAL HIGH (ref <5.7)
Mean Plasma Glucose: 160 (calc)
eAG (mmol/L): 8.9 (calc)

## 2019-07-15 LAB — PSA: PSA: 1.7 ng/mL (ref <=4.0)

## 2019-08-15 ENCOUNTER — Telehealth: Payer: Self-pay | Admitting: Family Medicine

## 2019-08-15 NOTE — Telephone Encounter (Signed)
Pt's wife stated pt is taking B12 over the counter but they need to know how much he is supposed to take and for how long. Please advise [phone]

## 2019-08-16 NOTE — Telephone Encounter (Signed)
Patient wife notified he should be taking 1,000 mcg SL daily.

## 2019-08-25 ENCOUNTER — Ambulatory Visit: Payer: Medicare Other | Attending: Internal Medicine

## 2019-08-25 DIAGNOSIS — Z23 Encounter for immunization: Secondary | ICD-10-CM | POA: Insufficient documentation

## 2019-08-25 NOTE — Progress Notes (Signed)
   Covid-19 Vaccination Clinic  Name:  Jimmy Green    MRN: [id number] DOB: [birth year]  08/25/2019  [name] was observed post Covid-19 immunization for 15 minutes without incidence. He was provided with Vaccine Information Sheet and instruction to access the V-Safe system.   [name] was instructed to call 911 with any severe reactions post vaccine: [name] Difficulty breathing  . Swelling of your face and throat  . A fast heartbeat  . A bad rash all over your body  . Dizziness and weakness    Immunizations Administered    Name Date Dose VIS Date Route   Pfizer COVID-19 Vaccine 08/25/2019  9:02 AM 0.3 mL 07/14/2019 Intramuscular   Manufacturer: [place]   Lot: [id number]   [place]: [id number]

## 2019-09-04 ENCOUNTER — Other Ambulatory Visit: Payer: Self-pay | Admitting: Family Medicine

## 2019-09-04 DIAGNOSIS — I10 Essential (primary) hypertension: Secondary | ICD-10-CM

## 2019-09-04 DIAGNOSIS — M5136 Other intervertebral disc degeneration, lumbar region: Secondary | ICD-10-CM | POA: Diagnosis not present

## 2019-09-04 DIAGNOSIS — M545 Low back pain: Secondary | ICD-10-CM | POA: Diagnosis not present

## 2019-09-14 ENCOUNTER — Ambulatory Visit: Payer: Medicare Other | Attending: Internal Medicine

## 2019-09-14 DIAGNOSIS — Z23 Encounter for immunization: Secondary | ICD-10-CM

## 2019-09-14 NOTE — Progress Notes (Signed)
   Covid-19 Vaccination Clinic  Name:  Jimmy Green    MRN: [id number] DOB: [birth year]  09/14/2019  Jimmy Green was observed post Covid-19 immunization for 15 minutes without incidence. He was provided with Vaccine Information Sheet and instruction to access the V-Safe system.   Jimmy Green was instructed to call 911 with any severe reactions post vaccine: [name] Difficulty breathing  . Swelling of your face and throat  . A fast heartbeat  . A bad rash all over your body  . Dizziness and weakness    Immunizations Administered    Name Date Dose VIS Date Route   Pfizer COVID-19 Vaccine 09/14/2019  1:47 PM 0.3 mL 07/14/2019 Intramuscular   Manufacturer: [place]   Lot: [id number]   [place]: [id number]

## 2019-10-02 DIAGNOSIS — Z96642 Presence of left artificial hip joint: Secondary | ICD-10-CM | POA: Diagnosis not present

## 2019-10-02 DIAGNOSIS — G894 Chronic pain syndrome: Secondary | ICD-10-CM | POA: Diagnosis not present

## 2019-10-02 DIAGNOSIS — M9904 Segmental and somatic dysfunction of sacral region: Secondary | ICD-10-CM | POA: Diagnosis not present

## 2019-10-02 DIAGNOSIS — M47816 Spondylosis without myelopathy or radiculopathy, lumbar region: Secondary | ICD-10-CM | POA: Diagnosis not present

## 2019-10-02 DIAGNOSIS — M169 Osteoarthritis of hip, unspecified: Secondary | ICD-10-CM | POA: Diagnosis not present

## 2019-10-02 DIAGNOSIS — E1140 Type 2 diabetes mellitus with diabetic neuropathy, unspecified: Secondary | ICD-10-CM | POA: Diagnosis not present

## 2019-10-02 DIAGNOSIS — G8929 Other chronic pain: Secondary | ICD-10-CM | POA: Diagnosis not present

## 2019-10-02 DIAGNOSIS — M545 Low back pain: Secondary | ICD-10-CM | POA: Diagnosis not present

## 2019-10-02 DIAGNOSIS — R519 Headache, unspecified: Secondary | ICD-10-CM | POA: Diagnosis not present

## 2019-10-02 DIAGNOSIS — M792 Neuralgia and neuritis, unspecified: Secondary | ICD-10-CM | POA: Diagnosis not present

## 2019-10-02 DIAGNOSIS — M5136 Other intervertebral disc degeneration, lumbar region: Secondary | ICD-10-CM | POA: Diagnosis not present

## 2019-10-31 DIAGNOSIS — G894 Chronic pain syndrome: Secondary | ICD-10-CM | POA: Diagnosis not present

## 2019-10-31 DIAGNOSIS — M169 Osteoarthritis of hip, unspecified: Secondary | ICD-10-CM | POA: Diagnosis not present

## 2019-10-31 DIAGNOSIS — M545 Low back pain: Secondary | ICD-10-CM | POA: Diagnosis not present

## 2019-10-31 DIAGNOSIS — M5136 Other intervertebral disc degeneration, lumbar region: Secondary | ICD-10-CM | POA: Diagnosis not present

## 2019-11-13 ENCOUNTER — Other Ambulatory Visit: Payer: Self-pay

## 2019-11-13 ENCOUNTER — Ambulatory Visit (INDEPENDENT_AMBULATORY_CARE_PROVIDER_SITE_OTHER): Payer: Medicare Other | Admitting: Family Medicine

## 2019-11-13 ENCOUNTER — Encounter: Payer: Self-pay | Admitting: Family Medicine

## 2019-11-13 VITALS — BP 140/86 | HR 76 | Temp 97.5°F | Resp 16 | Ht 67.0 in | Wt 227.7 lb

## 2019-11-13 DIAGNOSIS — I10 Essential (primary) hypertension: Secondary | ICD-10-CM | POA: Diagnosis not present

## 2019-11-13 DIAGNOSIS — E538 Deficiency of other specified B group vitamins: Secondary | ICD-10-CM

## 2019-11-13 DIAGNOSIS — E785 Hyperlipidemia, unspecified: Secondary | ICD-10-CM

## 2019-11-13 DIAGNOSIS — E1169 Type 2 diabetes mellitus with other specified complication: Secondary | ICD-10-CM | POA: Diagnosis not present

## 2019-11-13 DIAGNOSIS — G2581 Restless legs syndrome: Secondary | ICD-10-CM

## 2019-11-13 DIAGNOSIS — I2510 Atherosclerotic heart disease of native coronary artery without angina pectoris: Secondary | ICD-10-CM

## 2019-11-13 DIAGNOSIS — N401 Enlarged prostate with lower urinary tract symptoms: Secondary | ICD-10-CM

## 2019-11-13 DIAGNOSIS — E782 Mixed hyperlipidemia: Secondary | ICD-10-CM

## 2019-11-13 DIAGNOSIS — K219 Gastro-esophageal reflux disease without esophagitis: Secondary | ICD-10-CM | POA: Diagnosis not present

## 2019-11-13 DIAGNOSIS — N138 Other obstructive and reflux uropathy: Secondary | ICD-10-CM

## 2019-11-13 DIAGNOSIS — F330 Major depressive disorder, recurrent, mild: Secondary | ICD-10-CM

## 2019-11-13 DIAGNOSIS — I200 Unstable angina: Secondary | ICD-10-CM

## 2019-11-13 LAB — POCT GLYCOSYLATED HEMOGLOBIN (HGB A1C): HbA1c, POC (controlled diabetic range): 7.2 % — AB (ref 0.0–7.0)

## 2019-11-13 MED ORDER — ESCITALOPRAM OXALATE 20 MG PO TABS: 20.0000 mg | ORAL_TABLET | Freq: Every day | ORAL | 1 refills | Status: DC

## 2019-11-13 MED ORDER — AMLODIPINE BESYLATE 2.5 MG PO TABS: 2.5000 mg | ORAL_TABLET | Freq: Every day | ORAL | 1 refills | Status: DC

## 2019-11-13 MED ORDER — OMEPRAZOLE 20 MG PO CPDR: 20.0000 mg | DELAYED_RELEASE_CAPSULE | Freq: Every day | ORAL | 1 refills | Status: DC

## 2019-11-13 MED ORDER — BUPROPION HCL ER (XL) 150 MG PO TB24: 450.0000 mg | ORAL_TABLET | Freq: Every day | ORAL | 1 refills | Status: DC

## 2019-11-13 MED ORDER — METOPROLOL SUCCINATE ER 50 MG PO TB24: 50.0000 mg | ORAL_TABLET | Freq: Every evening | ORAL | 1 refills | Status: DC

## 2019-11-13 MED ORDER — CYANOCOBALAMIN 1000 MCG/ML IJ SOLN
1000.0000 ug | Freq: Once | INTRAMUSCULAR | Status: AC
  Administered 2019-11-13: 1000 ug via INTRAMUSCULAR

## 2019-11-13 MED ORDER — ATORVASTATIN CALCIUM 40 MG PO TABS: 40.0000 mg | ORAL_TABLET | Freq: Every day | ORAL | 1 refills | Status: DC

## 2019-11-13 MED ORDER — CYANOCOBALAMIN 1000 MCG/ML IJ SOLN: 1000.0000 ug | Freq: Once | INTRAMUSCULAR | 0 refills | Status: AC

## 2019-11-13 MED ORDER — METFORMIN HCL ER 750 MG PO TB24: 750.0000 mg | ORAL_TABLET | Freq: Every day | ORAL | 1 refills | Status: DC

## 2019-11-13 MED ORDER — GABAPENTIN 300 MG PO CAPS: 300.0000 mg | ORAL_CAPSULE | Freq: Every day | ORAL | 1 refills | Status: DC

## 2019-11-13 MED ORDER — LOSARTAN POTASSIUM-HCTZ 100-12.5 MG PO TABS: 1.0000 | ORAL_TABLET | Freq: Every day | ORAL | 1 refills | Status: DC

## 2019-11-13 NOTE — Addendum Note (Signed)
Addended by: Inda Coke on: 11/13/2019 09:12 AM   Modules accepted: Orders

## 2019-11-13 NOTE — Progress Notes (Signed)
Name: Jimmy Green   MRN: [id number]    DOB: [birth year]   Date:11/13/2019       Progress Note  Subjective  Chief Complaint  Chief Complaint  Patient presents with  . Hypertension  . Sleep Apnea  . Dyslipidemia  . Diabetes    HPI  Depression Major:taking medication daily, no suicidal thoughts or ideation,he is currently in remission. PHq9is zero, he has been working in his yard. Doing well   RLS: we gave him gabapentin last visit and he states 100 mg has helped with his RLS and leg pain, he has been taking one to two at night, we will increase to 300 mg qhs   GERD/Cough: he is doing well on Omeprazole , no longer coughing.   Dyslipidemia with type II DM: he denies polyphagia, polydipsia or polyuria. His last A1C went from 6.9 % to 7.2 % and again above 7 % today, he continues to drink sodas throughout the day, discussed healthier diet, he will try to cut down on regular sodas and we will also increase dose of Metformin from 500 to 750 mg ER   He has been takingaspirin daily, ARBand statin therapy. He states his glucose fasting has been around 120's-130's  HTN: she has been taking medication daily. BP today is borderline but he is afraid to change mediation. No side ffects  Hyperlipidemia: taking Atorvastatin and denies side effects. No myalgia.We will recheck labs today    OSA: he got a CPAP mask was not fitting properly and causing right forehead pain , ordered by Dr. Jacqualine Code stopped using it. He states not interested in using the CPAP   History of basal cell carcinoma: he was seeing Dr. Nicole Kindred every 6 months but lost to follow up, explained importance of going back. He states he will call her back   CAD: taking aspirin, statin and beta blocker and ARB,and he has NTG in his pocket, but has not used it yet.. Nochest pain or decrease in exercise tolerance. He was seen by Dr. Ubaldo Glassing in01/2019, and had another heart cath that showed no significant disease, 55-70%  stenosis on the LAD. He also lost of follow up, he needs to go back for a follow up  Left hip replacement history :under the care of Dr. Primus Bravo on pain medication and pain today is4/10, still under the care of pain clinic, symptoms usually present during activity  BPH: reviewed last labs   Morbid Obesity: his BMI is above 35 with co-morbidities, explained need to cut down on sodas  B12 : he has been taking 5000 mcg twice a week, we will check levels today and give him a B12 injection, no fatigue or paresthesias at this time  Patient Active Problem List   Diagnosis Date Noted  . BPH with obstruction/lower urinary tract symptoms 05/20/2018  . Basal cell carcinoma 03/28/2018  . History of basal cell carcinoma 03/23/2018  . Primary localized osteoarthritis of left hip 04/06/2017  . Dyslipidemia associated with type 2 diabetes mellitus ([place]) 10/04/2016  . Trigger thumb of both thumbs 06/02/2016  . Chronic tension-type headache, intractable 09/25/2015  . Benign paroxysmal positional vertigo due to bilateral vestibular disorder 09/25/2015  . Hearing loss sensory, bilateral 08/26/2015  . Cerebral microvascular disease 08/26/2015  . Metabolic syndrome 76/73/4193  . Vertigo 06/21/2015  . Buzzing in ear 06/21/2015  . Hypertension goal BP (blood pressure) < 140/90 03/28/2015  . GERD (gastroesophageal reflux disease) 03/28/2015  . Hyperlipidemia 03/28/2015  . Depression, major 03/28/2015  .  CAD in native artery 03/28/2015  . Actinic keratosis 02/14/2015  . Obstructive sleep apnea 02/03/2015  . Diverticulosis of colon without hemorrhage 01/31/2015  . Hx of colonic polyps   . Benign neoplasm of descending colon   . Benign neoplasm of sigmoid colon   . Idiopathic colitis   . Sebaceous cyst 09/14/2014    Past Surgical History:  Procedure Laterality Date  . BACK SURGERY    . BASAL CELL CARCINOMA EXCISION Left 12/02/2015   Chest-Done by Dermatologist   . BASAL CELL CARCINOMA EXCISION  Left 02/21/2018   Nodulocytic pattern, deep margin involved - [facility] - Dr. Brendolyn Patty  . BASAL CELL CARCINOMA EXCISION  02/23/2018  . CARDIAC CATHETERIZATION Left 04/01/2016   Procedure: Left Heart Cath and Coronary Angiography;  Surgeon: Teodoro Spray, MD;  Location: [place] [facility];  Service: Cardiovascular;  Laterality: Left;  . CARDIAC CATHETERIZATION N/A 04/01/2016   Procedure: Intravascular Pressure Wire/FFR Study;  Surgeon: Isaias Cowman, MD;  Location: [place] [facility];  Service: Cardiovascular;  Laterality: N/A;  . COLONOSCOPY N/A 01/15/2015   Wohl-ileitis, 2 benign polyps, cryptitis, sigmoid diverticulosis, focal ulceration ICV  . CORONARY STENT PLACEMENT    . FRACTIONAL FLOW RESERVE WIRE  10/08/2011   Procedure: FRACTIONAL FLOW RESERVE WIRE;  Surgeon: Clent Demark, MD;  Location: [facility] CATH LAB;  Service: Cardiovascular;;  . HERNIA REPAIR    . KNEE SURGERY    . LEFT HEART CATHETERIZATION WITH CORONARY ANGIOGRAM N/A 10/08/2011   Procedure: LEFT HEART CATHETERIZATION WITH CORONARY ANGIOGRAM;  Surgeon: Clent Demark, MD;  Location: [place] CATH LAB;  Service: Cardiovascular;  Laterality: N/A;  . SKIN CANCER EXCISION  01/2018   [facility]  . TOTAL HIP ARTHROPLASTY Left 04/06/2017   Procedure: TOTAL HIP ARTHROPLASTY ANTERIOR APPROACH;  Surgeon: Hessie Knows, MD;  Location: [facility] ORS;  Service: Orthopedics;  Laterality: Left;    Family History  Problem Relation Age of Onset  . Lung cancer Father   . Heart disease Father   . Skin cancer Father     Social History   Tobacco Use  . Smoking status: Former Smoker    Years: 40.00    Types: Cigarettes    Quit date: 08/03/2012    Years since quitting: 7.2  . Smokeless tobacco: Never Used  Substance Use Topics  . Alcohol use: No    Alcohol/week: 0.0 standard drinks     Current Outpatient Medications:  .  amLODipine (NORVASC) 2.5 MG tablet, Take 1 tablet (2.5 mg total) by mouth daily., Disp: 90  tablet, Rfl: 1 .  aspirin EC 81 MG tablet, Take 81 mg by mouth every morning. , Disp: , Rfl:  .  atorvastatin (LIPITOR) 40 MG tablet, TAKE 1 TABLET (40 MG TOTAL) BY MOUTH DAILY., Disp: 90 tablet, Rfl: 1 .  buPROPion (WELLBUTRIN XL) 150 MG 24 hr tablet, TAKE 3 TABLETS BY MOUTH DAILY., Disp: 270 tablet, Rfl: 1 .  diclofenac sodium (VOLTAREN) 1 % GEL, Apply topically., Disp: , Rfl:  .  escitalopram (LEXAPRO) 20 MG tablet, Take 1 tablet (20 mg total) by mouth daily., Disp: 90 tablet, Rfl: 1 .  fluticasone (FLONASE) 50 MCG/ACT nasal spray, Place 2 sprays into both nostrils daily., Disp: 16 g, Rfl: 0 .  gabapentin (NEURONTIN) 100 MG capsule, Take 1-3 capsules (100-300 mg total) by mouth at bedtime., Disp: 90 capsule, Rfl: 0 .  glucose blood (ACCU-CHEK ACTIVE STRIPS) test strip, Use as instructed, Disp: 100 each, Rfl: 12 .  Lancets (ACCU-CHEK SOFT TOUCH) lancets, Use as instructed, Disp: 100 each, Rfl: 12 .  metFORMIN (GLUCOPHAGE-XR) 500 MG 24 hr tablet, Take 1 tablet (500 mg total) by mouth daily with breakfast., Disp: 90 tablet, Rfl: 1 .  metoprolol succinate (TOPROL-XL) 50 MG 24 hr tablet, Take 1 tablet (50 mg total) by mouth every evening. Take with or immediately following a meal., Disp: 90 tablet, Rfl: 1 .  NARCAN 4 MG/0.1ML LIQD nasal spray kit, , Disp: , Rfl: 0 .  nitroGLYCERIN (NITROSTAT) 0.4 MG SL tablet, DISSOLVE 1 TABLET UNDER THE TONGUE EVERY 5 MINUTES FOR UP TO 3 DOSES AS NEEDED FOR CHEST PAIN. IF NO RELIEF AFTER 3 DOSES, CALL 911 OR GO TO, Disp: 25 tablet, Rfl: 0 .  omeprazole (PRILOSEC) 20 MG capsule, Take 1 capsule (20 mg total) by mouth daily., Disp: 90 capsule, Rfl: 1 .  OVER THE COUNTER MEDICATION, hylands restless legs, Disp: , Rfl:  .  oxyCODONE (ROXICODONE) 15 MG immediate release tablet, Take 1 tablet by mouth 5 (five) times daily as needed., Disp: , Rfl:  .  PREVIDENT 5000 SENSITIVE 1.1-5 % PSTE, Apply 1 each topically 2 (two) times daily., Disp: , Rfl: 5 .   losartan-hydrochlorothiazide (HYZAAR) 100-12.5 MG tablet, Take 1 tablet by mouth daily., Disp: 90 tablet, Rfl: 1  Allergies  Allergen Reactions  . Elavil [Amitriptyline Hcl] Rash  . Tape Rash    Paper Tape    I personally reviewed active problem list, medication list, allergies, family history, social history, health maintenance with the patient/caregiver today.   ROS  Constitutional: Negative for fever or weight change.  Respiratory: Negative for cough and shortness of breath.   Cardiovascular: Negative for chest pain or palpitations.  Gastrointestinal: Negative for abdominal pain, no bowel changes.  Musculoskeletal: Negative for gait problem or joint swelling.  Skin: Negative for rash.  Neurological: Negative for dizziness or headache.  No other specific complaints in a complete review of systems (except as listed in HPI above).  Objective  Vitals:   11/13/19 0822  BP: 140/86  Pulse: 76  Resp: 16  Temp: (!) 97.5 F (36.4 C)  TempSrc: Temporal  SpO2: 95%  Weight: 227 lb 11.2 oz (103.3 kg)  Height: '5\' 7"'  (1.702 m)    Body mass index is 35.66 kg/m.  Physical Exam  Constitutional: Patient appears well-developed and well-nourished. Obese No distress.  HEENT: head atraumatic, normocephalic, pupils equal and reactive to light Cardiovascular: Normal rate, regular rhythm and normal heart sounds.  No murmur heard. No BLE edema. Pulmonary/Chest: Effort normal and breath sounds normal. No respiratory distress. Abdominal: Soft.  There is no tenderness. Psychiatric: Patient has a normal mood and affect. behavior is normal. Judgment and thought content normal.  PHQ2/9: Depression screen [facility] 2/9 11/13/2019 07/14/2019 06/23/2019 02/08/2019 09/16/2018  Decreased Interest 0 0 1 0 0  Down, Depressed, Hopeless 0 0 0 0 0  PHQ - 2 Score 0 0 1 0 0  Altered sleeping 0 0 1 0 1  Tired, decreased energy 0 2 3 0 1  Change in appetite 0 0 0 0 0  Feeling bad or failure about yourself  0 0 0  0 0  Trouble concentrating 0 0 0 0 0  Moving slowly or fidgety/restless 0 0 0 0 0  Suicidal thoughts 0 0 0 0 0  PHQ-9 Score 0 2 5 0 2  Difficult doing work/chores - Somewhat difficult Not difficult at all - Not difficult at all  Some recent data  might be hidden    phq 9 is negative   Fall Risk: Fall Risk  11/13/2019 06/23/2019 02/08/2019 09/16/2018 06/16/2018  Falls in the past year? 0 0 0 0 0  Number falls in past yr: 0 0 0 - 0  Injury with Fall? 0 0 0 - -  Risk for fall due to : - Orthopedic patient - - -  Follow up - Falls prevention discussed - - -     Functional Status Survey: Is the patient deaf or have difficulty hearing?: No Does the patient have difficulty seeing, even when wearing glasses/contacts?: No Does the patient have difficulty concentrating, remembering, or making decisions?: No Does the patient have difficulty walking or climbing stairs?: No Does the patient have difficulty dressing or bathing?: No Does the patient have difficulty doing errands alone such as visiting a doctor's office or shopping?: No    Assessment & Plan  1. Dyslipidemia associated with type 2 diabetes mellitus (HCC)  - POCT HgB A1C - metFORMIN (GLUCOPHAGE-XR) 750 MG 24 hr tablet; Take 1 tablet (750 mg total) by mouth daily with breakfast.  Dispense: 90 tablet; Refill: 1 - Lipid panel  2. Gastroesophageal reflux disease without esophagitis  - omeprazole (PRILOSEC) 20 MG capsule; Take 1 capsule (20 mg total) by mouth daily.  Dispense: 90 capsule; Refill: 1  3. CAD in native artery  - atorvastatin (LIPITOR) 40 MG tablet; Take 1 tablet (40 mg total) by mouth daily.  Dispense: 90 tablet; Refill: 1 - metoprolol succinate (TOPROL-XL) 50 MG 24 hr tablet; Take 1 tablet (50 mg total) by mouth every evening. Take with or immediately following a meal.  Dispense: 90 tablet; Refill: 1  4. B12 deficiency  - Vitamin B12 - CBC with Differential/Platelet - cyanocobalamin (,VITAMIN B-12,) 1000 MCG/ML  injection; Inject 1 mL (1,000 mcg total) into the muscle once for 1 dose.  Dispense: 1 mL; Refill: 0  5. Unstable angina (HCC)  Needs to follow up with Dr. Ubaldo Glassing   6. Hypertension, benign  - amLODipine (NORVASC) 2.5 MG tablet; Take 1 tablet (2.5 mg total) by mouth daily.  Dispense: 90 tablet; Refill: 1 - losartan-hydrochlorothiazide (HYZAAR) 100-12.5 MG tablet; Take 1 tablet by mouth daily.  Dispense: 90 tablet; Refill: 1 - metoprolol succinate (TOPROL-XL) 50 MG 24 hr tablet; Take 1 tablet (50 mg total) by mouth every evening. Take with or immediately following a meal.  Dispense: 90 tablet; Refill: 1 - COMPLETE METABOLIC PANEL WITH GFR  7. BPH with obstruction/lower urinary tract symptoms   8. Depression, major, recurrent, mild (HCC)  - buPROPion (WELLBUTRIN XL) 150 MG 24 hr tablet; Take 3 tablets (450 mg total) by mouth daily.  Dispense: 270 tablet; Refill: 1 - escitalopram (LEXAPRO) 20 MG tablet; Take 1 tablet (20 mg total) by mouth daily.  Dispense: 90 tablet; Refill: 1  9. RLS (restless legs syndrome)  - gabapentin (NEURONTIN) 300 MG capsule; Take 1 capsule (300 mg total) by mouth at bedtime.  Dispense: 90 capsule; Refill: 1  10. Morbid obesity ([place])  Explained importance of changing diet and losing weight   11. Mixed hyperlipidemia  - atorvastatin (LIPITOR) 40 MG tablet; Take 1 tablet (40 mg total) by mouth daily.  Dispense: 90 tablet; Refill: 1

## 2019-11-14 ENCOUNTER — Other Ambulatory Visit: Payer: Self-pay | Admitting: Family Medicine

## 2019-11-14 ENCOUNTER — Telehealth: Payer: Self-pay

## 2019-11-14 LAB — COMPLETE METABOLIC PANEL WITH GFR
AG Ratio: 1.5 (calc) (ref 1.0–2.5)
ALT: 32 U/L (ref 9–46)
AST: 24 U/L (ref 10–35)
Albumin: 3.9 g/dL (ref 3.6–5.1)
Alkaline phosphatase (APISO): 107 U/L (ref 35–144)
BUN/Creatinine Ratio: 16 (calc) (ref 6–22)
BUN: 11 mg/dL (ref 7–25)
CO2: 27 mmol/L (ref 20–32)
Calcium: 9.6 mg/dL (ref 8.6–10.3)
Chloride: 103 mmol/L (ref 98–110)
Creat: 0.69 mg/dL — ABNORMAL LOW (ref 0.70–1.25)
GFR, Est African American: 114 mL/min/{1.73_m2} (ref >=60)
GFR, Est Non African American: 98 mL/min/{1.73_m2} (ref >=60)
Globulin: 2.6 g/dL (calc) (ref 1.9–3.7)
Glucose, Bld: 145 mg/dL — ABNORMAL HIGH (ref 65–99)
Potassium: 4 mmol/L (ref 3.5–5.3)
Sodium: 138 mmol/L (ref 135–146)
Total Bilirubin: 1 mg/dL (ref 0.2–1.2)
Total Protein: 6.5 g/dL (ref 6.1–8.1)

## 2019-11-14 LAB — CBC WITH DIFFERENTIAL/PLATELET
Absolute Monocytes: 510 cells/uL (ref 200–950)
Basophils Absolute: 26 cells/uL (ref 0–200)
Basophils Relative: 0.3 %
Eosinophils Absolute: 220 cells/uL (ref 15–500)
Eosinophils Relative: 2.5 %
HCT: 45.1 % (ref 38.5–50.0)
Hemoglobin: 15.1 g/dL (ref 13.2–17.1)
Lymphs Abs: 1558 cells/uL (ref 850–3900)
MCH: 31.4 pg (ref 27.0–33.0)
MCHC: 33.5 g/dL (ref 32.0–36.0)
MCV: 93.8 fL (ref 80.0–100.0)
MPV: 10.6 fL (ref 7.5–12.5)
Monocytes Relative: 5.8 %
Neutro Abs: 6486 cells/uL (ref 1500–7800)
Neutrophils Relative %: 73.7 %
Platelets: 295 10*3/uL (ref 140–400)
RBC: 4.81 10*6/uL (ref 4.20–5.80)
RDW: 12.5 % (ref 11.0–15.0)
Total Lymphocyte: 17.7 %
WBC: 8.8 10*3/uL (ref 3.8–10.8)

## 2019-11-14 LAB — LIPID PANEL
Cholesterol: 136 mg/dL (ref <200)
HDL: 30 mg/dL — ABNORMAL LOW (ref >=40)
LDL Cholesterol (Calc): 82 mg/dL (calc)
Non-HDL Cholesterol (Calc): 106 mg/dL (calc) (ref <130)
Total CHOL/HDL Ratio: 4.5 (calc) (ref <5.0)
Triglycerides: 138 mg/dL (ref <150)

## 2019-11-14 LAB — VITAMIN B12: Vitamin B-12: 1395 pg/mL — ABNORMAL HIGH (ref 200–1100)

## 2019-11-14 MED ORDER — ROSUVASTATIN CALCIUM 40 MG PO TABS: 40.0000 mg | ORAL_TABLET | Freq: Every day | ORAL | 3 refills | Status: DC

## 2019-11-14 NOTE — Telephone Encounter (Signed)
Copied from [place] [id number]. Topic: Quick Communication - Lab Results (Clinic Use ONLY) >> Nov 14, 2019 10:28 AM Vonna Kotyk, CMA wrote: Called patient to inform them of most lab results. When patient returns call, triage nurse may disclose results.  Patient wife notified of lab results by phone and will call back with what her husband wants her to do. >> Nov 14, 2019 11:22 AM Leward Quan A wrote: Patient wife called to inform nurse that her husband would like to take the Crestor 40 MG. Asking for Rx to be sent to the pharmacy please

## 2019-11-14 NOTE — Telephone Encounter (Signed)
Copied from [place] [id number]. Topic: Quick Communication - Lab Results (Clinic Use ONLY) >> Nov 14, 2019 10:28 AM Vonna Kotyk, CMA wrote: Called patient to inform them of most lab results. When patient returns call, triage nurse may disclose results.  Patient wife notified of lab results by phone and will call back with what her husband wants her to do. >> Nov 14, 2019 11:22 AM Leward Quan A wrote: Patient wife called to inform nurse that her husband would like to take the Crestor 40 MG. Asking for Rx to be sent to the pharmacy please

## 2019-11-27 DIAGNOSIS — M5136 Other intervertebral disc degeneration, lumbar region: Secondary | ICD-10-CM | POA: Diagnosis not present

## 2019-11-27 DIAGNOSIS — G894 Chronic pain syndrome: Secondary | ICD-10-CM | POA: Diagnosis not present

## 2019-11-27 DIAGNOSIS — M545 Low back pain: Secondary | ICD-10-CM | POA: Diagnosis not present

## 2019-11-27 DIAGNOSIS — M169 Osteoarthritis of hip, unspecified: Secondary | ICD-10-CM | POA: Diagnosis not present

## 2019-11-30 DIAGNOSIS — I2510 Atherosclerotic heart disease of native coronary artery without angina pectoris: Secondary | ICD-10-CM | POA: Diagnosis not present

## 2019-11-30 DIAGNOSIS — E782 Mixed hyperlipidemia: Secondary | ICD-10-CM | POA: Diagnosis not present

## 2019-11-30 DIAGNOSIS — G4733 Obstructive sleep apnea (adult) (pediatric): Secondary | ICD-10-CM | POA: Diagnosis not present

## 2019-12-04 ENCOUNTER — Telehealth: Payer: Self-pay | Admitting: Family Medicine

## 2019-12-04 NOTE — Chronic Care Management (AMB) (Signed)
  Chronic Care Management   Note  12/04/2019 Name: Jimmy Green MRN: [id number] DOB: Apr 16, 1951  Jimmy Green is a 69 y.o. year old male who is a primary care patient of Steele Sizer, MD. I reached out to [name] by phone today in response to a referral sent by Jimmy Green health plan.     Jimmy Green was given information about Chronic Care Management services today including:  1. CCM service includes personalized support from designated clinical staff supervised by his physician, including individualized plan of care and coordination with other care providers 2. 24/7 contact phone numbers for assistance for urgent and routine care needs. 3. Service will only be billed when office clinical staff spend 20 minutes or more in a month to coordinate care. 4. Only one practitioner may furnish and bill the service in a calendar month. 5. The patient may stop CCM services at any time (effective at the end of the month) by phone call to the office staff. 6. The patient will be responsible for cost sharing (co-pay) of up to 20% of the service fee (after annual deductible is met).  Patient did not agree to enrollment in care management services and does not wish to consider at this time.  Follow up plan: The patient has been provided with contact information for the care management team and has been advised to call with any health related questions or concerns.   Jimmy Green, [address], [place], [place] [address] Direct Dial: [phone] [email or web address] Website: [email or web address]

## 2019-12-25 DIAGNOSIS — M5136 Other intervertebral disc degeneration, lumbar region: Secondary | ICD-10-CM | POA: Diagnosis not present

## 2019-12-25 DIAGNOSIS — G894 Chronic pain syndrome: Secondary | ICD-10-CM | POA: Diagnosis not present

## 2019-12-25 DIAGNOSIS — M545 Low back pain: Secondary | ICD-10-CM | POA: Diagnosis not present

## 2020-01-12 ENCOUNTER — Ambulatory Visit (INDEPENDENT_AMBULATORY_CARE_PROVIDER_SITE_OTHER): Payer: Medicare Other | Admitting: Family Medicine

## 2020-01-12 ENCOUNTER — Other Ambulatory Visit: Payer: Self-pay

## 2020-01-12 ENCOUNTER — Encounter: Payer: Self-pay | Admitting: Family Medicine

## 2020-01-12 VITALS — BP 140/86 | HR 79 | Temp 98.8°F | Resp 20 | Ht 67.0 in | Wt 228.4 lb

## 2020-01-12 DIAGNOSIS — I200 Unstable angina: Secondary | ICD-10-CM | POA: Diagnosis not present

## 2020-01-12 DIAGNOSIS — K219 Gastro-esophageal reflux disease without esophagitis: Secondary | ICD-10-CM | POA: Diagnosis not present

## 2020-01-12 DIAGNOSIS — R05 Cough: Secondary | ICD-10-CM

## 2020-01-12 DIAGNOSIS — G4730 Sleep apnea, unspecified: Secondary | ICD-10-CM

## 2020-01-12 DIAGNOSIS — R059 Cough, unspecified: Secondary | ICD-10-CM

## 2020-01-12 MED ORDER — TRELEGY ELLIPTA 100-62.5-25 MCG/INH IN AEPB: 1.0000 | INHALATION_SPRAY | Freq: Every day | RESPIRATORY_TRACT | 0 refills | Status: DC

## 2020-01-12 MED ORDER — OMEPRAZOLE 20 MG PO CPDR: 20.0000 mg | DELAYED_RELEASE_CAPSULE | Freq: Two times a day (BID) | ORAL | 0 refills | Status: DC

## 2020-01-12 MED ORDER — HYDROCOD POLST-CPM POLST ER 10-8 MG/5ML PO SUER: 5.0000 mL | Freq: Two times a day (BID) | ORAL | 0 refills | Status: DC | PRN

## 2020-01-12 NOTE — Progress Notes (Signed)
Name: Jimmy Green   MRN: [id number]    DOB: 1951/05/05   Date:01/12/2020       Progress Note  Subjective  Chief Complaint  Chief Complaint  Patient presents with  . Follow-up    Acid reflux  . Cough    HPI  Cough: he used to smoke but was a light smoker - one pack lasted a few days - and quit about 7 years ago. He states he has recurrent episodes of coughing spells. He was at the beach and his wife was very worried because he was gasping and choking during the night, coughing spells. He is also having more heartburn and indigestion and has been having to sleep in his recliner. He has been taking omeprazole daily. Cough is dry, some frontal headache. COVID -19 vaccine is up to date. No fever or chills.   Chest pain: he continues to have chest pain intermittently during rest or activity, he was seen by Dr. Ubaldo Glassing and advised to have CT angiogram but he has not heard back from scheduling crew. We tried contacting Dr. Ubaldo Glassing but office was already closed. I will send a copy of his office.    Patient Active Problem List   Diagnosis Date Noted  . BPH with obstruction/lower urinary tract symptoms 05/20/2018  . Basal cell carcinoma 03/28/2018  . History of basal cell carcinoma 03/23/2018  . Primary localized osteoarthritis of left hip 04/06/2017  . Dyslipidemia associated with type 2 diabetes mellitus ([place]) 10/04/2016  . Trigger thumb of both thumbs 06/02/2016  . Chronic tension-type headache, intractable 09/25/2015  . Benign paroxysmal positional vertigo due to bilateral vestibular disorder 09/25/2015  . Hearing loss sensory, bilateral 08/26/2015  . Cerebral microvascular disease 08/26/2015  . Metabolic syndrome 25/42/7062  . Vertigo 06/21/2015  . Buzzing in ear 06/21/2015  . Hypertension goal BP (blood pressure) < 140/90 03/28/2015  . GERD (gastroesophageal reflux disease) 03/28/2015  . Hyperlipidemia 03/28/2015  . Depression, major 03/28/2015  . CAD in native artery 03/28/2015  .  Actinic keratosis 02/14/2015  . Obstructive sleep apnea 02/03/2015  . Diverticulosis of colon without hemorrhage 01/31/2015  . Hx of colonic polyps   . Benign neoplasm of descending colon   . Benign neoplasm of sigmoid colon   . Idiopathic colitis   . Sebaceous cyst 09/14/2014    Past Surgical History:  Procedure Laterality Date  . BACK SURGERY    . BASAL CELL CARCINOMA EXCISION Left 12/02/2015   Chest-Done by Dermatologist   . BASAL CELL CARCINOMA EXCISION Left 02/21/2018   Nodulocytic pattern, deep margin involved - [facility] - Dr. Brendolyn Patty  . BASAL CELL CARCINOMA EXCISION  02/23/2018  . CARDIAC CATHETERIZATION Left 04/01/2016   Procedure: Left Heart Cath and Coronary Angiography;  Surgeon: Teodoro Spray, MD;  Location: [facility];  Service: Cardiovascular;  Laterality: Left;  . CARDIAC CATHETERIZATION N/A 04/01/2016   Procedure: Intravascular Pressure Wire/FFR Study;  Surgeon: Isaias Cowman, MD;  Location: [facility];  Service: Cardiovascular;  Laterality: N/A;  . COLONOSCOPY N/A 01/15/2015   Wohl-ileitis, 2 benign polyps, cryptitis, sigmoid diverticulosis, focal ulceration ICV  . CORONARY STENT PLACEMENT    . FRACTIONAL FLOW RESERVE WIRE  10/08/2011   Procedure: FRACTIONAL FLOW RESERVE WIRE;  Surgeon: Clent Demark, MD;  Location: [facility] CATH LAB;  Service: Cardiovascular;;  . HERNIA REPAIR    . KNEE SURGERY    . LEFT HEART CATHETERIZATION WITH CORONARY ANGIOGRAM N/A 10/08/2011   Procedure: LEFT  HEART CATHETERIZATION WITH CORONARY ANGIOGRAM;  Surgeon: Clent Demark, MD;  Location: [facility] CATH LAB;  Service: Cardiovascular;  Laterality: N/A;  . SKIN CANCER EXCISION  01/2018   [facility]  . TOTAL HIP ARTHROPLASTY Left 04/06/2017   Procedure: TOTAL HIP ARTHROPLASTY ANTERIOR APPROACH;  Surgeon: Hessie Knows, MD;  Location: [facility] ORS;  Service: Orthopedics;  Laterality: Left;    Family History  Problem Relation Age of Onset  . Lung cancer  Father   . Heart disease Father   . Skin cancer Father     Social History   Tobacco Use  . Smoking status: Former Smoker    Years: 40.00    Types: Cigarettes    Quit date: 08/03/2012    Years since quitting: 7.4  . Smokeless tobacco: Never Used  Substance Use Topics  . Alcohol use: No    Alcohol/week: 0.0 standard drinks     Current Outpatient Medications:  .  amLODipine (NORVASC) 2.5 MG tablet, Take 1 tablet (2.5 mg total) by mouth daily., Disp: 90 tablet, Rfl: 1 .  aspirin EC 81 MG tablet, Take 81 mg by mouth every morning. , Disp: , Rfl:  .  buPROPion (WELLBUTRIN XL) 150 MG 24 hr tablet, Take 3 tablets (450 mg total) by mouth daily., Disp: 270 tablet, Rfl: 1 .  diclofenac sodium (VOLTAREN) 1 % GEL, Apply topically., Disp: , Rfl:  .  escitalopram (LEXAPRO) 20 MG tablet, Take 1 tablet (20 mg total) by mouth daily., Disp: 90 tablet, Rfl: 1 .  gabapentin (NEURONTIN) 300 MG capsule, Take 1 capsule (300 mg total) by mouth at bedtime., Disp: 90 capsule, Rfl: 1 .  glucose blood (ACCU-CHEK ACTIVE STRIPS) test strip, Use as instructed, Disp: 100 each, Rfl: 12 .  Lancets (ACCU-CHEK SOFT TOUCH) lancets, Use as instructed, Disp: 100 each, Rfl: 12 .  losartan-hydrochlorothiazide (HYZAAR) 100-12.5 MG tablet, Take 1 tablet by mouth daily., Disp: 90 tablet, Rfl: 1 .  metFORMIN (GLUCOPHAGE-XR) 750 MG 24 hr tablet, Take 1 tablet (750 mg total) by mouth daily with breakfast., Disp: 90 tablet, Rfl: 1 .  metoprolol succinate (TOPROL-XL) 50 MG 24 hr tablet, Take 1 tablet (50 mg total) by mouth every evening. Take with or immediately following a meal., Disp: 90 tablet, Rfl: 1 .  NARCAN 4 MG/0.1ML LIQD nasal spray kit, , Disp: , Rfl: 0 .  nitroGLYCERIN (NITROSTAT) 0.4 MG SL tablet, DISSOLVE 1 TABLET UNDER THE TONGUE EVERY 5 MINUTES FOR UP TO 3 DOSES AS NEEDED FOR CHEST PAIN. IF NO RELIEF AFTER 3 DOSES, CALL 911 OR GO TO, Disp: 25 tablet, Rfl: 0 .  omeprazole (PRILOSEC) 20 MG capsule, Take 1 capsule (20 mg  total) by mouth 2 (two) times daily before a meal., Disp: 180 capsule, Rfl: 0 .  OVER THE COUNTER MEDICATION, hylands restless legs, Disp: , Rfl:  .  oxyCODONE (ROXICODONE) 15 MG immediate release tablet, Take 1 tablet by mouth 5 (five) times daily as needed., Disp: , Rfl:  .  PREVIDENT 5000 SENSITIVE 1.1-5 % PSTE, Apply 1 each topically 2 (two) times daily., Disp: , Rfl: 5 .  rosuvastatin (CRESTOR) 40 MG tablet, Take 1 tablet (40 mg total) by mouth daily., Disp: 90 tablet, Rfl: 3 .  chlorpheniramine-HYDROcodone (TUSSIONEX PENNKINETIC ER) 10-8 MG/5ML SUER, Take 5 mLs by mouth every 12 (twelve) hours as needed., Disp: 140 mL, Rfl: 0 .  Fluticasone-Umeclidin-Vilant (TRELEGY ELLIPTA) 100-62.5-25 MCG/INH AEPB, Inhale 1 puff into the lungs daily., Disp: 60 each, Rfl: 0  Allergies  Allergen Reactions  . Elavil [Amitriptyline Hcl] Rash  . Tape Rash    Paper Tape    I personally reviewed active problem list, medication list, allergies, family history, social history, health maintenance with the patient/caregiver today.   ROS  Constitutional: Negative for fever or weight change.  Respiratory: Positive  for cough and shortness of breath.   Cardiovascular: Positive  for chest pain but no  palpitations.  Gastrointestinal: Negative for abdominal pain, no bowel changes.  Musculoskeletal: Negative for gait problem or joint swelling.  Skin: Negative for rash.  Neurological: Negative for dizziness, positive for frontal  headache.  No other specific complaints in a complete review of systems (except as listed in HPI above).  Objective  Vitals:   01/12/20 1446  BP: 140/86  Pulse: 79  Resp: 20  Temp: 98.8 F (37.1 C)  TempSrc: Temporal  SpO2: 98%  Weight: 228 lb 6.4 oz (103.6 kg)  Height: '5\' 7"'  (1.702 m)    Body mass index is 35.77 kg/m.  Physical Exam  Constitutional: Patient appears well-developed and well-nourished. Obese  No distress.  HEENT: head atraumatic, normocephalic, pupils  equal and reactive to light, ears normal TM bilaterally , neck supple, throat within normal limits Cardiovascular: Normal rate, regular rhythm and normal heart sounds.  No murmur heard. No BLE edema. Pulmonary/Chest: Effort normal and breath sounds normal. No respiratory distress. Abdominal: Soft.  There is no tenderness. Psychiatric: Patient has a normal mood and affect. behavior is normal. Judgment and thought content normal.  Recent Results (from the past 2160 hour(s))  POCT HgB A1C     Status: Abnormal   Collection Time: 11/13/19  8:29 AM  Result Value Ref Range   Hemoglobin A1C     HbA1c POC (<> result, manual entry)     HbA1c, POC (prediabetic range)     HbA1c, POC (controlled diabetic range) 7.2 (A) 0.0 - 7.0 %  Vitamin B12     Status: Abnormal   Collection Time: 11/13/19  9:09 AM  Result Value Ref Range   Vitamin B-12 1,395 (H) 200 - 1,100 pg/mL  Lipid panel     Status: Abnormal   Collection Time: 11/13/19  9:09 AM  Result Value Ref Range   Cholesterol 136 <200 mg/dL   HDL 30 (L) > OR = 40 mg/dL   Triglycerides 138 <150 mg/dL   LDL Cholesterol (Calc) 82 mg/dL (calc)    Comment: Reference range: <100 . Desirable range <100 mg/dL for primary prevention;   <70 mg/dL for patients with CHD or diabetic patients  with > or = 2 CHD risk factors. [name] LDL-C is now calculated using the Martin-Hopkins  calculation, which is a validated novel method providing  better accuracy than the Friedewald equation in the  estimation of LDL-C.  Cresenciano Genre et al. Annamaria Helling. 6283;662(94): 2061-2068  (http://education.[facility].com/faq/FAQ164)    Total CHOL/HDL Ratio 4.5 <5.0 (calc)   Non-HDL Cholesterol (Calc) 106 <130 mg/dL (calc)    Comment: For patients with diabetes plus 1 major ASCVD risk  factor, treating to a non-HDL-C goal of <100 mg/dL  (LDL-C of <70 mg/dL) is considered a therapeutic  option.   COMPLETE METABOLIC PANEL WITH GFR     Status: Abnormal   Collection Time: 11/13/19   9:09 AM  Result Value Ref Range   Glucose, Bld 145 (H) 65 - 99 mg/dL    Comment: .            Fasting reference interval . For someone without known  diabetes, a glucose value >125 mg/dL indicates that they may have diabetes and this should be confirmed with a follow-up test. .    BUN 11 7 - 25 mg/dL   Creat 0.69 (L) 0.70 - 1.25 mg/dL    Comment: For patients >26 years of age, the reference limit for Creatinine is approximately 13% higher for people identified as African-American. .    GFR, Est Non African American 98 > OR = 60 mL/min/1.70m   GFR, Est African American 114 > OR = 60 mL/min/1.784m  BUN/Creatinine Ratio 16 6 - 22 (calc)   Sodium 138 135 - 146 mmol/L   Potassium 4.0 3.5 - 5.3 mmol/L   Chloride 103 98 - 110 mmol/L   CO2 27 20 - 32 mmol/L   Calcium 9.6 8.6 - 10.3 mg/dL   Total Protein 6.5 6.1 - 8.1 g/dL   Albumin 3.9 3.6 - 5.1 g/dL   Globulin 2.6 1.9 - 3.7 g/dL (calc)   AG Ratio 1.5 1.0 - 2.5 (calc)   Total Bilirubin 1.0 0.2 - 1.2 mg/dL   Alkaline phosphatase (APISO) 107 35 - 144 U/L   AST 24 10 - 35 U/L   ALT 32 9 - 46 U/L  CBC with Differential/Platelet     Status: None   Collection Time: 11/13/19  9:09 AM  Result Value Ref Range   WBC 8.8 3.8 - 10.8 Thousand/uL   RBC 4.81 4.20 - 5.80 Million/uL   Hemoglobin 15.1 13.2 - 17.1 g/dL   HCT 45.1 38 - 50 %   MCV 93.8 80.0 - 100.0 fL   MCH 31.4 27.0 - 33.0 pg   MCHC 33.5 32.0 - 36.0 g/dL   RDW 12.5 11.0 - 15.0 %   Platelets 295 140 - 400 Thousand/uL   MPV 10.6 7.5 - 12.5 fL   Neutro Abs 6,486 1,500 - 7,800 cells/uL   Lymphs Abs 1,558 850 - 3,900 cells/uL   Absolute Monocytes 510 200 - 950 cells/uL   Eosinophils Absolute 220 15 - 500 cells/uL   Basophils Absolute 26 0 - 200 cells/uL   Neutrophils Relative % 73.7 %   Total Lymphocyte 17.7 %   Monocytes Relative 5.8 %   Eosinophils Relative 2.5 %   Basophils Relative 0.3 %     PHQ2/9: Depression screen [facility]/9 01/12/2020 11/13/2019 07/14/2019 06/23/2019  02/08/2019  Decreased Interest 0 0 0 1 0  Down, Depressed, Hopeless 0 0 0 0 0  PHQ - 2 Score 0 0 0 1 0  Altered sleeping 0 0 0 1 0  Tired, decreased energy 0 0 2 3 0  Change in appetite 0 0 0 0 0  Feeling bad or failure about yourself  0 0 0 0 0  Trouble concentrating 0 0 0 0 0  Moving slowly or fidgety/restless 0 0 0 0 0  Suicidal thoughts 0 0 0 0 0  PHQ-9 Score 0 0 2 5 0  Difficult doing work/chores - - Somewhat difficult Not difficult at all -  Some recent data might be hidden    phq 9 is negative   Fall Risk: Fall Risk  01/12/2020 11/13/2019 06/23/2019 02/08/2019 09/16/2018  Falls in the past year? 0 0 0 0 0  Number falls in past yr: 0 0 0 0 -  Injury with Fall? - 0 0 0 -  Risk for fall due to : - - Orthopedic patient - -  Follow up - - Falls prevention discussed - -  Functional Status Survey: Is the patient deaf or have difficulty hearing?: No Does the patient have difficulty seeing, even when wearing glasses/contacts?: No Does the patient have difficulty concentrating, remembering, or making decisions?: No Does the patient have difficulty walking or climbing stairs?: No Does the patient have difficulty dressing or bathing?: No Does the patient have difficulty doing errands alone such as visiting a doctor's office or shopping?: No    Assessment & Plan  1. Unstable angina (HCC)  Seeing Dr. Ubaldo Glassing   2. Gastroesophageal reflux disease without esophagitis  - omeprazole (PRILOSEC) 20 MG capsule; Take 1 capsule (20 mg total) by mouth 2 (two) times daily before a meal.  Dispense: 180 capsule; Refill: 0  3. Sleep apnea in adult  - Ambulatory referral to Sleep Studies  4. Cough  - omeprazole (PRILOSEC) 20 MG capsule; Take 1 capsule (20 mg total) by mouth 2 (two) times daily before a meal.  Dispense: 180 capsule; Refill: 0 - chlorpheniramine-HYDROcodone (TUSSIONEX PENNKINETIC ER) 10-8 MG/5ML SUER; Take 5 mLs by mouth every 12 (twelve) hours as needed.  Dispense: 140 mL;  Refill: 0 - Fluticasone-Umeclidin-Vilant (TRELEGY ELLIPTA) 100-62.5-25 MCG/INH AEPB; Inhale 1 puff into the lungs daily.  Dispense: 60 each; Refill: 0

## 2020-01-22 DIAGNOSIS — M169 Osteoarthritis of hip, unspecified: Secondary | ICD-10-CM | POA: Diagnosis not present

## 2020-01-22 DIAGNOSIS — M545 Low back pain: Secondary | ICD-10-CM | POA: Diagnosis not present

## 2020-01-22 DIAGNOSIS — M5136 Other intervertebral disc degeneration, lumbar region: Secondary | ICD-10-CM | POA: Diagnosis not present

## 2020-01-22 DIAGNOSIS — G894 Chronic pain syndrome: Secondary | ICD-10-CM | POA: Diagnosis not present

## 2020-01-22 DIAGNOSIS — E1140 Type 2 diabetes mellitus with diabetic neuropathy, unspecified: Secondary | ICD-10-CM | POA: Diagnosis not present

## 2020-01-22 DIAGNOSIS — M9904 Segmental and somatic dysfunction of sacral region: Secondary | ICD-10-CM | POA: Diagnosis not present

## 2020-01-23 ENCOUNTER — Telehealth: Payer: Self-pay | Admitting: Family Medicine

## 2020-01-23 NOTE — Telephone Encounter (Signed)
Pt had an appt on yesterday with his pain doctor. The doctor stopped his pain meds because it showed in the system that he picked up a cough medication that had codeine in it. He never took the medication. He stated that he did not know that you where calling it in and his wife is the one that went to the pharmacy and just picked up all his medication. Now that the pain doctor will not prescribe anymore pain meds he does not know what to do and just wanted you to know.

## 2020-01-24 NOTE — Telephone Encounter (Signed)
Called Dr. Mohammed Kindle to explain to him about the medication. NO Answer. LVM. [phone].

## 2020-01-26 ENCOUNTER — Telehealth: Payer: Self-pay

## 2020-01-26 NOTE — Telephone Encounter (Signed)
Copied from [place] [phone]. Topic: General - Other >> Jan 26, 2020  9:39 AM Yvette Rack wrote: Reason for CRM: Pt wife stated they just missed a call from the office and it possibly was regarding his sleep study test. Pt wife requests call back after 1 pm >> Jan 26, 2020 11:47 AM Chilton Greathouse, CMA wrote: I spoke with patient and told him a referral has been placed and they would contact him to schedule. I also gave him the number to call if they didn't contact him soon.

## 2020-01-26 NOTE — Telephone Encounter (Signed)
Called patient. He thinks everything is going to be ok, but he doesn't know for sure until next week. He wants to stay with Dr. Primus Bravo.

## 2020-02-06 ENCOUNTER — Telehealth: Payer: Self-pay

## 2020-02-06 NOTE — Telephone Encounter (Signed)
Copied from [place] [id number]. Topic: General - Other >> Feb 06, 2020 11:58 AM Marya Landry D wrote: Reason for CRM: Patients wife called wanting to confirm the name of the sleep study location and phone number bc they still havent received a call to schedule appt

## 2020-02-14 ENCOUNTER — Ambulatory Visit: Payer: Medicare Other | Attending: Neurology

## 2020-02-14 DIAGNOSIS — G4733 Obstructive sleep apnea (adult) (pediatric): Secondary | ICD-10-CM | POA: Diagnosis not present

## 2020-02-15 ENCOUNTER — Other Ambulatory Visit: Payer: Self-pay

## 2020-02-18 DIAGNOSIS — G4733 Obstructive sleep apnea (adult) (pediatric): Secondary | ICD-10-CM | POA: Diagnosis not present

## 2020-02-19 DIAGNOSIS — M5136 Other intervertebral disc degeneration, lumbar region: Secondary | ICD-10-CM | POA: Diagnosis not present

## 2020-02-19 DIAGNOSIS — M545 Low back pain: Secondary | ICD-10-CM | POA: Diagnosis not present

## 2020-02-19 DIAGNOSIS — M169 Osteoarthritis of hip, unspecified: Secondary | ICD-10-CM | POA: Diagnosis not present

## 2020-02-26 ENCOUNTER — Telehealth: Payer: Self-pay

## 2020-02-26 NOTE — Telephone Encounter (Signed)
Copied from [place] [phone]. Topic: General - Call Back - No Documentation >> Feb 26, 2020 12:36 PM Erick Blinks wrote: Reason for CRM: Pt's wife called and would like a call back to discuss pt's sleep study results. Please advise  [phone].   Called patient's wife she wanted to know the results of the sleep study. I told her that he was positive for sleep apnea. BIPAP was recommended with 13-17 cm H2O. She wants to know when will he get his machine. He aspirated in his sleep a few days ago.

## 2020-02-28 NOTE — Telephone Encounter (Signed)
Rx has been completed and faxed.

## 2020-03-04 ENCOUNTER — Ambulatory Visit: Payer: Self-pay | Admitting: *Deleted

## 2020-03-04 NOTE — Telephone Encounter (Signed)
Patient is calling to report ongoing ulcers on tongue. Patient states they come and go- with no relief. He can only link stopping B12 injection with the occurrence. Patient has tried several mouthwashes for help with pain- but none seem to help. Appointment scheduled. Reason for Disposition  Mouth ulcer lasts > 2 weeks  Answer Assessment - Initial Assessment Questions 1. LOCATION: "Where is the ulcer located?"      Tongue only 2. NUMBER: "How many ulcers are there?"      Up to 3-4 3. SIZE: "How large is the ulcer?"      Size of dime-or smaller 4. SEVERITY: "Are they painful?" If Yes, ask: "How bad is it?"  (Scale 1-10; or mild, moderate, severe)  - MILD - eating  and drinking normally   - MODERATE - decreased liquid intake   - SEVERE - drinking very little      Yes- milds/moderate 5. ONSET: "When did you first notice the ulcer?"       Over 6 months ago 6. RECURRENT SYMPTOM: "Have you had a mouth ulcer before?" If Yes, ask: "When was the last time?" and "What happened that time?"      No- this is new problem 7. CAUSE: "What do you think is causing the mouth ulcer?"     Absence of B12 injection 8. OTHER SYMPTOMS: "Do you have any other symptoms?" (e.g., fever)     no 9. PREGNANCY: "Is there any chance you are pregnant?" "When was your last menstrual period?"     n/a  Protocols used: MOUTH ULCERS-A-AH

## 2020-03-04 NOTE — Telephone Encounter (Signed)
Patient's wife Jan, stated he has ulcers on tongue and they started after he stopped receiving B12 shot, patient is barely eating. Wife wants to know what he needs to do, whether he should make an appointment to start getting the B12 shot again.

## 2020-03-06 ENCOUNTER — Other Ambulatory Visit: Payer: Self-pay

## 2020-03-06 ENCOUNTER — Encounter: Payer: Self-pay | Admitting: Family Medicine

## 2020-03-06 ENCOUNTER — Ambulatory Visit (INDEPENDENT_AMBULATORY_CARE_PROVIDER_SITE_OTHER): Payer: Medicare Other | Admitting: Family Medicine

## 2020-03-06 VITALS — BP 140/86 | HR 69 | Temp 97.5°F | Resp 16 | Ht 67.0 in | Wt 230.8 lb

## 2020-03-06 DIAGNOSIS — K111 Hypertrophy of salivary gland: Secondary | ICD-10-CM

## 2020-03-06 DIAGNOSIS — K1370 Unspecified lesions of oral mucosa: Secondary | ICD-10-CM

## 2020-03-06 NOTE — Progress Notes (Signed)
Name: Jimmy Green   MRN: [id number]    DOB: 12/13/1950   Date:03/06/2020       Progress Note  Subjective  Chief Complaint  Chief Complaint  Patient presents with  . Mouth Lesions    He has white bumps on his tongue that started when he started taking b-12 supplements. He stopped taking them the other day and they bumps went away. He would like to start back taking b-12 injections instead.  . Mass    He has a mass on the left side of his face x 2 months    HPI  Oral irritation: patient states since he started taking sub-lingual B12 he noticed white spots and oral irritation, but since he stopped taking it symptoms resolved, he is not having problems today  Left parotid mass: he noticed enlargement of left side of face about 2 months ago, non tender, no redness, he denies dry mouth, no fever or chills. He states not sure if changing in size.   Patient Active Problem List   Diagnosis Date Noted  . BPH with obstruction/lower urinary tract symptoms 05/20/2018  . Basal cell carcinoma 03/28/2018  . History of basal cell carcinoma 03/23/2018  . Primary localized osteoarthritis of left hip 04/06/2017  . Dyslipidemia associated with type 2 diabetes mellitus ([place]) 10/04/2016  . Trigger thumb of both thumbs 06/02/2016  . Chronic tension-type headache, intractable 09/25/2015  . Benign paroxysmal positional vertigo due to bilateral vestibular disorder 09/25/2015  . Hearing loss sensory, bilateral 08/26/2015  . Cerebral microvascular disease 08/26/2015  . Metabolic syndrome 79/09/4095  . Vertigo 06/21/2015  . Buzzing in ear 06/21/2015  . Hypertension goal BP (blood pressure) < 140/90 03/28/2015  . GERD (gastroesophageal reflux disease) 03/28/2015  . Hyperlipidemia 03/28/2015  . Depression, major 03/28/2015  . CAD in native artery 03/28/2015  . Actinic keratosis 02/14/2015  . Obstructive sleep apnea 02/03/2015  . Diverticulosis of colon without hemorrhage 01/31/2015  . Hx of colonic  polyps   . Benign neoplasm of descending colon   . Benign neoplasm of sigmoid colon   . Idiopathic colitis   . Sebaceous cyst 09/14/2014    Social History   Tobacco Use  . Smoking status: Former Smoker    Years: 40.00    Types: Cigarettes    Quit date: 08/03/2012    Years since quitting: 7.5  . Smokeless tobacco: Never Used  Substance Use Topics  . Alcohol use: No    Alcohol/week: 0.0 standard drinks     Current Outpatient Medications:  .  amLODipine (NORVASC) 2.5 MG tablet, Take 1 tablet (2.5 mg total) by mouth daily., Disp: 90 tablet, Rfl: 1 .  aspirin EC 81 MG tablet, Take 81 mg by mouth every morning. , Disp: , Rfl:  .  buPROPion (WELLBUTRIN XL) 150 MG 24 hr tablet, Take 3 tablets (450 mg total) by mouth daily., Disp: 270 tablet, Rfl: 1 .  diclofenac sodium (VOLTAREN) 1 % GEL, Apply topically., Disp: , Rfl:  .  escitalopram (LEXAPRO) 20 MG tablet, Take 1 tablet (20 mg total) by mouth daily., Disp: 90 tablet, Rfl: 1 .  Fluticasone-Umeclidin-Vilant (TRELEGY ELLIPTA) 100-62.5-25 MCG/INH AEPB, Inhale 1 puff into the lungs daily., Disp: 60 each, Rfl: 0 .  gabapentin (NEURONTIN) 300 MG capsule, Take 1 capsule (300 mg total) by mouth at bedtime., Disp: 90 capsule, Rfl: 1 .  glucose blood (ACCU-CHEK ACTIVE STRIPS) test strip, Use as instructed, Disp: 100 each, Rfl: 12 .  Lancets (ACCU-CHEK SOFT TOUCH) lancets,  Use as instructed, Disp: 100 each, Rfl: 12 .  metFORMIN (GLUCOPHAGE-XR) 750 MG 24 hr tablet, Take 1 tablet (750 mg total) by mouth daily with breakfast., Disp: 90 tablet, Rfl: 1 .  metoprolol succinate (TOPROL-XL) 50 MG 24 hr tablet, Take 1 tablet (50 mg total) by mouth every evening. Take with or immediately following a meal., Disp: 90 tablet, Rfl: 1 .  NARCAN 4 MG/0.1ML LIQD nasal spray kit, , Disp: , Rfl: 0 .  nitroGLYCERIN (NITROSTAT) 0.4 MG SL tablet, DISSOLVE 1 TABLET UNDER THE TONGUE EVERY 5 MINUTES FOR UP TO 3 DOSES AS NEEDED FOR CHEST PAIN. IF NO RELIEF AFTER 3 DOSES, CALL  911 OR GO TO, Disp: 25 tablet, Rfl: 0 .  omeprazole (PRILOSEC) 20 MG capsule, Take 1 capsule (20 mg total) by mouth 2 (two) times daily before a meal., Disp: 180 capsule, Rfl: 0 .  OVER THE COUNTER MEDICATION, hylands restless legs, Disp: , Rfl:  .  oxyCODONE (ROXICODONE) 15 MG immediate release tablet, Take 1 tablet by mouth 5 (five) times daily as needed., Disp: , Rfl:  .  PREVIDENT 5000 SENSITIVE 1.1-5 % PSTE, Apply 1 each topically 2 (two) times daily., Disp: , Rfl: 5 .  rosuvastatin (CRESTOR) 40 MG tablet, Take 1 tablet (40 mg total) by mouth daily., Disp: 90 tablet, Rfl: 3 .  chlorpheniramine-HYDROcodone (TUSSIONEX PENNKINETIC ER) 10-8 MG/5ML SUER, Take 5 mLs by mouth every 12 (twelve) hours as needed. (Patient not taking: Reported on 03/06/2020), Disp: 140 mL, Rfl: 0 .  losartan-hydrochlorothiazide (HYZAAR) 100-12.5 MG tablet, Take 1 tablet by mouth daily., Disp: 90 tablet, Rfl: 1  Allergies  Allergen Reactions  . Elavil [Amitriptyline Hcl] Rash  . Tape Rash    Paper Tape    ROS  Ten systems reviewed and is negative except as mentioned in HPI   Objective  Vitals:   03/06/20 1124  BP: 140/86  Pulse: 69  Resp: 16  Temp: (!) 97.5 F (36.4 C)  TempSrc: Temporal  SpO2: 98%  Weight: 230 lb 12.8 oz (104.7 kg)  Height: '5\' 7"'  (1.702 m)    Body mass index is 36.15 kg/m.    Physical Exam  Constitutional: Patient appears well-developed and well-nourished. Obese No distress.  HEENT: head atraumatic, normocephalic, pupils equal and reactive to light, neck supple, throat within normal limits, oral mucosa moist and without lesions, no masses palpable on left inner oral mucosa, missing teeth, parotid gland is soft and enlarged on left side, no redness or increase in warmth  Cardiovascular: Normal rate, regular rhythm and normal heart sounds.  No murmur heard. No BLE edema. Pulmonary/Chest: Effort normal and breath sounds normal. No respiratory distress. Abdominal: Soft.  There is no  tenderness. Psychiatric: Patient has a normal mood and affect. behavior is normal. Judgment and thought content normal.  Assessment & Plan  1. Enlarged parotid gland  - CT MAXILLOFACIAL W & WO CONTRAST; Future - Ambulatory referral to ENT - discussed with Dr. Tami Ribas, his CMA will contact patient   2. Mouth lesion  - CT MAXILLOFACIAL W & WO CONTRAST; Future

## 2020-03-07 ENCOUNTER — Other Ambulatory Visit: Payer: Self-pay | Admitting: Family Medicine

## 2020-03-11 NOTE — Telephone Encounter (Signed)
Called [facility] about CPAP machine they are waiting on previous notes. Notes were faxed today.

## 2020-03-11 NOTE — Telephone Encounter (Signed)
Pt received a call over  Week ago from company stating they were shipping CPAP machine but they still have not received it/ Pt needs to know where the machine orders were placed / Pt has aspirated 4 times since sleep study and need cpap machine asap/ please advise

## 2020-03-12 NOTE — Telephone Encounter (Signed)
Refaxed  previous notes to [place] on today, 03/12/20 @ 8:56 am with a confirmation page that fax went through.  Fax used was [phone].  The previous fax number given [phone] did not work.

## 2020-03-14 ENCOUNTER — Ambulatory Visit (INDEPENDENT_AMBULATORY_CARE_PROVIDER_SITE_OTHER): Payer: Medicare Other | Admitting: Family Medicine

## 2020-03-14 ENCOUNTER — Other Ambulatory Visit: Payer: Self-pay

## 2020-03-14 ENCOUNTER — Encounter: Payer: Self-pay | Admitting: Family Medicine

## 2020-03-14 VITALS — BP 140/100 | HR 71 | Temp 98.4°F | Resp 16 | Ht 67.0 in | Wt 234.5 lb

## 2020-03-14 DIAGNOSIS — K1370 Unspecified lesions of oral mucosa: Secondary | ICD-10-CM

## 2020-03-14 DIAGNOSIS — G4730 Sleep apnea, unspecified: Secondary | ICD-10-CM | POA: Diagnosis not present

## 2020-03-14 DIAGNOSIS — I200 Unstable angina: Secondary | ICD-10-CM

## 2020-03-14 DIAGNOSIS — Z1211 Encounter for screening for malignant neoplasm of colon: Secondary | ICD-10-CM

## 2020-03-14 DIAGNOSIS — I2510 Atherosclerotic heart disease of native coronary artery without angina pectoris: Secondary | ICD-10-CM

## 2020-03-14 DIAGNOSIS — E538 Deficiency of other specified B group vitamins: Secondary | ICD-10-CM

## 2020-03-14 DIAGNOSIS — E1169 Type 2 diabetes mellitus with other specified complication: Secondary | ICD-10-CM | POA: Diagnosis not present

## 2020-03-14 DIAGNOSIS — E785 Hyperlipidemia, unspecified: Secondary | ICD-10-CM

## 2020-03-14 DIAGNOSIS — I152 Hypertension secondary to endocrine disorders: Secondary | ICD-10-CM

## 2020-03-14 DIAGNOSIS — I10 Essential (primary) hypertension: Secondary | ICD-10-CM

## 2020-03-14 DIAGNOSIS — N138 Other obstructive and reflux uropathy: Secondary | ICD-10-CM

## 2020-03-14 DIAGNOSIS — N401 Enlarged prostate with lower urinary tract symptoms: Secondary | ICD-10-CM

## 2020-03-14 DIAGNOSIS — E1159 Type 2 diabetes mellitus with other circulatory complications: Secondary | ICD-10-CM

## 2020-03-14 DIAGNOSIS — F330 Major depressive disorder, recurrent, mild: Secondary | ICD-10-CM

## 2020-03-14 DIAGNOSIS — G2581 Restless legs syndrome: Secondary | ICD-10-CM

## 2020-03-14 LAB — POCT GLYCOSYLATED HEMOGLOBIN (HGB A1C): Hemoglobin A1C: 7.8 % — AB (ref 4.0–5.6)

## 2020-03-14 MED ORDER — METOPROLOL SUCCINATE ER 50 MG PO TB24: 50.0000 mg | ORAL_TABLET | Freq: Every evening | ORAL | 1 refills | Status: DC

## 2020-03-14 MED ORDER — BUPROPION HCL ER (XL) 300 MG PO TB24: 300.0000 mg | ORAL_TABLET | Freq: Every day | ORAL | 1 refills | Status: DC

## 2020-03-14 MED ORDER — ESCITALOPRAM OXALATE 20 MG PO TABS: 20.0000 mg | ORAL_TABLET | Freq: Every day | ORAL | 1 refills | Status: DC

## 2020-03-14 MED ORDER — AMLODIPINE BESYLATE 2.5 MG PO TABS: 2.5000 mg | ORAL_TABLET | Freq: Every day | ORAL | 1 refills | Status: DC

## 2020-03-14 MED ORDER — METFORMIN HCL ER 750 MG PO TB24: 1500.0000 mg | ORAL_TABLET | Freq: Every day | ORAL | 0 refills | Status: DC

## 2020-03-14 MED ORDER — CYANOCOBALAMIN 1000 MCG/ML IJ SOLN
1000.0000 ug | Freq: Once | INTRAMUSCULAR | Status: AC
  Administered 2020-03-14: 1000 ug via INTRAMUSCULAR

## 2020-03-14 MED ORDER — OLMESARTAN MEDOXOMIL-HCTZ 40-12.5 MG PO TABS: 1.0000 | ORAL_TABLET | Freq: Every day | ORAL | 1 refills | Status: DC

## 2020-03-14 NOTE — Patient Instructions (Addendum)
03/21/2020  2:00 PM  at  [facility]  For CT parathyroid   Medication changes:  Stop losartan hctz and start Olmasartan hctz 40/12.5 Decrease wellbutrin from three 150 mg XL to one 300 mg daily  Increase Metformin from one daily to two daily

## 2020-03-14 NOTE — Progress Notes (Signed)
Name: Jimmy Green   MRN: [id number]    DOB: 03-Sep-1950   Date:03/14/2020       Progress Note  Subjective  Chief Complaint  Chief Complaint  Patient presents with  . Diabetes  . Dyslipidemia  . Hypertension  . Sleep Apnea    HPI  Depression Major:taking medication daily, no suicidal thoughts or ideation,he is currently in remission. PHq9is zero, he has been working in his yard. He is worried about his sister that recently was diagnosed with possible cancer/ ovaries/pathology report pending   RLS:he is taking Gabapentin 300 mg qhs and it helps with symptoms, taking it prn now   GERD/Cough: he is doing well on Omeprazole , no longer coughing. Unchanged   Dyslipidemia with type II DM: he denies polyphagia, polydipsia or polyuria. Hislast A1C went from 6.9 % to 7.2 % and again 7.2 % , today it is  7.8%   , he continues to drink sodas throughout the day, discussed healthier diet, he will try to cut down on regular sodas and we increased  Metformin from 500 to 750 mg ER on his last visit but since still not at goal, discussed adding another medication but he prefers going up on Metformin He has been [place] daily, ARBand statin therapy. He has not been checking his glucose lately. Eye exam is up to date   HTN: she has been taking medication daily. BP is very high today, he is not sure why. No chest pain, palpitation or sob. Not wearing CPAP , we will switch from Losartan hctz to Benicar hctz , continue metoprolol and norvasc   Hyperlipidemia:he was taking Atorvastatin  LDL was not at goal, so we changed to Rosuvastatin 40 mg and is due for repeat labs    OSA: he got a CPAP mask was not fitting properly and causing right forehead pain , ordered by Dr. Jacqualine Code is now waiting to get another mask, having problems contacting company that did the study, but supposedly it will be ready the end of August   History of basal cell carcinoma: he was seeing Dr. Nicole Kindred every 6  months but lost to follow up, explained importance of going back. He states he will call her back   CAD: taking aspirin, statin and beta blocker and ARB,and he has NTG in his pocket, but has not used it yet.. Nochest pain or decrease in exercise tolerance. He saw Dr. Ubaldo Glassing 2019 , and had another heart cath that showed no significant disease, 55-70% stenosis on the LAD. He went back 11/2017 and advised to have CT angi[facility] done at [facility] but did not get the appointment . We will try to contact Dr. Ubaldo Glassing about it   Left hip replacement history :under the care of Dr. Primus Bravo on pain medication and pain today is3/10, still under the care of pain clinic, symptoms usually present during activitybetter with rest   BPH: reviewed last labs PSA was 1.7   Morbid Obesity: his BMI is above 35 with co-morbidities, explained need to cut down on sodas, still drinking two 20 oz bottles daily , discussed importance of weight loss . He has DM, HTN, OA, cAD    B12 : he is off otc supplementation, we will resume B12 injections   Oral irritation : patient states since he started taking sub-lingual B12 he noticed white spots and oral irritation, but since he stopped symptoms improved. Only one lesion since, we will give him B12 injection instead   Left parotid mass: he  noticed enlargement of left side of face about 2 months ago, non tender, no redness, he denies dry mouth, no fever or chills. He states not sure if changing in size, we ordered a CT scan, that will be done on 08/19 at 2 pm and he has an appointment scheduled with ENT on the 24 th  Patient Active Problem List   Diagnosis Date Noted  . BPH with obstruction/lower urinary tract symptoms 05/20/2018  . Basal cell carcinoma 03/28/2018  . History of basal cell carcinoma 03/23/2018  . Primary localized osteoarthritis of left hip 04/06/2017  . Dyslipidemia associated with type 2 diabetes mellitus ([place]) 10/04/2016  . Trigger thumb of both thumbs 06/02/2016   . Chronic tension-type headache, intractable 09/25/2015  . Benign paroxysmal positional vertigo due to bilateral vestibular disorder 09/25/2015  . Hearing loss sensory, bilateral 08/26/2015  . Cerebral microvascular disease 08/26/2015  . Metabolic syndrome 46/28/6381  . Vertigo 06/21/2015  . Buzzing in ear 06/21/2015  . Hypertension goal BP (blood pressure) < 140/90 03/28/2015  . GERD (gastroesophageal reflux disease) 03/28/2015  . Hyperlipidemia 03/28/2015  . Depression, major 03/28/2015  . CAD in native artery 03/28/2015  . Actinic keratosis 02/14/2015  . Obstructive sleep apnea 02/03/2015  . Diverticulosis of colon without hemorrhage 01/31/2015  . Hx of colonic polyps   . Benign neoplasm of descending colon   . Benign neoplasm of sigmoid colon   . Idiopathic colitis   . Sebaceous cyst 09/14/2014    Past Surgical History:  Procedure Laterality Date  . BACK SURGERY    . BASAL CELL CARCINOMA EXCISION Left 12/02/2015   Chest-Done by Dermatologist   . BASAL CELL CARCINOMA EXCISION Left 02/21/2018   Nodulocytic pattern, deep margin involved - [facility] - Dr. Brendolyn Patty  . BASAL CELL CARCINOMA EXCISION  02/23/2018  . CARDIAC CATHETERIZATION Left 04/01/2016   Procedure: Left Heart Cath and Coronary Angiography;  Surgeon: Teodoro Spray, MD;  Location: [place] [facility];  Service: Cardiovascular;  Laterality: Left;  . CARDIAC CATHETERIZATION N/A 04/01/2016   Procedure: Intravascular Pressure Wire/FFR Study;  Surgeon: Isaias Cowman, MD;  Location: [place] [facility];  Service: Cardiovascular;  Laterality: N/A;  . COLONOSCOPY N/A 01/15/2015   Wohl-ileitis, 2 benign polyps, cryptitis, sigmoid diverticulosis, focal ulceration ICV  . CORONARY STENT PLACEMENT    . FRACTIONAL FLOW RESERVE WIRE  10/08/2011   Procedure: FRACTIONAL FLOW RESERVE WIRE;  Surgeon: Clent Demark, MD;  Location: [facility] C[facility];  Service: Cardiovascular;;  . HERNIA REPAIR    . KNEE SURGERY    .  LEFT HEART CATHETERIZATION WITH CORONARY ANGIOGRAM N/A 10/08/2011   Procedure: LEFT HEART CATHETERIZATION WITH CORONARY ANGIOGRAM;  Surgeon: Clent Demark, MD;  Location: [place] [facility];  Service: Cardiovascular;  Laterality: N/A;  . SKIN CANCER EXCISION  01/2018   [facility]  . TOTAL HIP ARTHROPLASTY Left 04/06/2017   Procedure: TOTAL HIP ARTHROPLASTY ANTERIOR APPROACH;  Surgeon: Hessie Knows, MD;  Location: [facility] ORS;  Service: Orthopedics;  Laterality: Left;    Family History  Problem Relation Age of Onset  . Lung cancer Father   . Heart disease Father   . Skin cancer Father     Social History   Tobacco Use  . Smoking status: Former Smoker    Years: 40.00    Types: Cigarettes    Quit date: 08/03/2012    Years since quitting: 7.6  . Smokeless tobacco: Never Used  Substance Use Topics  . Alcohol use: No  Alcohol/week: 0.0 standard drinks     Current Outpatient Medications:  .  amLODipine (NORVASC) 2.5 MG tablet, Take 1 tablet (2.5 mg total) by mouth daily., Disp: 90 tablet, Rfl: 1 .  aspirin EC 81 MG tablet, Take 81 mg by mouth every morning. , Disp: , Rfl:  .  buPROPion (WELLBUTRIN XL) 300 MG 24 hr tablet, Take 1 tablet (300 mg total) by mouth daily. New dose, instead of 3 of 150 mg, Disp: 90 tablet, Rfl: 1 .  diclofenac sodium (VOLTAREN) 1 % GEL, Apply topically., Disp: , Rfl:  .  escitalopram (LEXAPRO) 20 MG tablet, Take 1 tablet (20 mg total) by mouth daily., Disp: 90 tablet, Rfl: 1 .  gabapentin (NEURONTIN) 300 MG capsule, Take 1 capsule (300 mg total) by mouth at bedtime., Disp: 90 capsule, Rfl: 1 .  glucose blood (ACCU-CHEK ACTIVE STRIPS) test strip, Use as instructed, Disp: 100 each, Rfl: 12 .  Lancets (ACCU-CHEK SOFT TOUCH) lancets, Use as instructed, Disp: 100 each, Rfl: 12 .  metFORMIN (GLUCOPHAGE-XR) 750 MG 24 hr tablet, Take 2 tablets (1,500 mg total) by mouth daily with breakfast., Disp: 180 tablet, Rfl: 0 .  metoprolol succinate (TOPROL-XL) 50 MG 24 hr  tablet, Take 1 tablet (50 mg total) by mouth every evening. Take with or immediately following a meal., Disp: 90 tablet, Rfl: 1 .  NARCAN 4 MG/0.1ML LIQD nasal spray kit, , Disp: , Rfl: 0 .  nitroGLYCERIN (NITROSTAT) 0.4 MG SL tablet, DISSOLVE 1 TABLET UNDER THE TONGUE EVERY 5 MINUTES FOR UP TO 3 DOSES AS NEEDED FOR CHEST PAIN. IF NO RELIEF AFTER 3 DOSES, CALL 911 OR GO TO, Disp: 25 tablet, Rfl: 0 .  omeprazole (PRILOSEC) 20 MG capsule, Take 1 capsule (20 mg total) by mouth 2 (two) times daily before a meal., Disp: 180 capsule, Rfl: 0 .  OVER THE COUNTER MEDICATION, hylands restless legs, Disp: , Rfl:  .  oxyCODONE (ROXICODONE) 15 MG immediate release tablet, Take 1 tablet by mouth 5 (five) times daily as needed., Disp: , Rfl:  .  PREVIDENT 5000 SENSITIVE 1.1-5 % PSTE, Apply 1 each topically 2 (two) times daily., Disp: , Rfl: 5 .  rosuvastatin (CRESTOR) 40 MG tablet, Take 1 tablet (40 mg total) by mouth daily., Disp: 90 tablet, Rfl: 3 .  olmesartan-hydrochlorothiazide (BENICAR HCT) 40-12.5 MG tablet, Take 1 tablet by mouth daily. In place of losartan hctz for bp, Disp: 90 tablet, Rfl: 1  Allergies  Allergen Reactions  . Elavil [Amitriptyline Hcl] Rash  . Tape Rash    Paper Tape    I personally reviewed active problem list, medication list, allergies, family history, social history, health maintenance with the patient/caregiver today.   ROS  Constitutional: Negative for fever or weight change.  Respiratory: Negative for cough and shortness of breath.   Cardiovascular: Negative for chest pain or palpitations.  Gastrointestinal: Negative for abdominal pain, no bowel changes.  Musculoskeletal: positive  for gait problem or joint swelling.  Skin: Negative for rash.  Neurological: Negative for dizziness , positive for intermittent  headache.  No other specific complaints in a complete review of systems (except as listed in HPI above).  Objective  Vitals:   03/14/20 0804 03/14/20 0901   BP: (!) 160/100 (!) 140/100  Pulse: 71   Resp: 16   Temp: 98.4 F (36.9 C)   TempSrc: Oral   SpO2: 96%   Weight: 234 lb 8 oz (106.4 kg)   Height: '5\' 7"'  (1.702 m)  Body mass index is 36.73 kg/m.  Physical Exam  Constitutional: Patient appears well-developed and well-nourished. Obese No distress.  HEENT: head atraumatic, normocephalic, pupils equal and reactive to light,  neck supple, one small white bump on tip of tongue, possible aphthous ulcer  Cardiovascular: Normal rate, regular rhythm and normal heart sounds.  No murmur heard. No BLE edema. Pulmonary/Chest: Effort normal and breath sounds normal. No respiratory distress. Abdominal: Soft.  There is no tenderness. Psychiatric: Patient has a normal mood and affect. behavior is normal. Judgment and thought content normal.    PHQ2/9: Depression screen [facility] 2/9 03/14/2020 03/06/2020 01/12/2020 11/13/2019 07/14/2019  Decreased Interest 0 0 0 0 0  Down, Depressed, Hopeless 0 0 0 0 0  PHQ - 2 Score 0 0 0 0 0  Altered sleeping 0 0 0 0 0  Tired, decreased energy 0 0 0 0 2  Change in appetite 0 0 0 0 0  Feeling bad or failure about yourself  0 0 0 0 0  Trouble concentrating 0 0 0 0 0  Moving slowly or fidgety/restless 0 0 0 0 0  Suicidal thoughts 0 0 0 0 0  PHQ-9 Score 0 0 0 0 2  Difficult doing work/chores - - - - Somewhat difficult  Some recent data might be hidden    phq 9 is negative  Fall Risk: Fall Risk  03/14/2020 03/06/2020 01/12/2020 11/13/2019 06/23/2019  Falls in the past year? 0 0 0 0 0  Number falls in past yr: 0 0 0 0 0  Injury with Fall? 0 0 - 0 0  Risk for fall due to : - - - - Orthopedic patient  Follow up - - - - Falls prevention discussed     Assessment & Plan  1. Dyslipidemia associated with type 2 diabetes mellitus (HCC)  - POCT HgB A1C - Lipid panel - Microalbumin / creatinine urine ratio - COMPLETE METABOLIC PANEL WITH GFR - metFORMIN (GLUCOPHAGE-XR) 750 MG 24 hr tablet; Take 2 tablets (1,500 mg  total) by mouth daily with breakfast.  Dispense: 180 tablet; Refill: 0  2. Unstable angina (HCC)  - metoprolol succinate (TOPROL-XL) 50 MG 24 hr tablet; Take 1 tablet (50 mg total) by mouth every evening. Take with or immediately following a meal.  Dispense: 90 tablet; Refill: 1  3. Sleep apnea in adult  Waiting for the mask  4. CAD in native artery  - metoprolol succinate (TOPROL-XL) 50 MG 24 hr tablet; Take 1 tablet (50 mg total) by mouth every evening. Take with or immediately following a meal.  Dispense: 90 tablet; Refill: 1  5. BPH with obstruction/lower urinary tract symptoms   6. Hypertension, benign  - metoprolol succinate (TOPROL-XL) 50 MG 24 hr tablet; Take 1 tablet (50 mg total) by mouth every evening. Take with or immediately following a meal.  Dispense: 90 tablet; Refill: 1 - amLODipine (NORVASC) 2.5 MG tablet; Take 1 tablet (2.5 mg total) by mouth daily.  Dispense: 90 tablet; Refill: 1 - olmesartan-hydrochlorothiazide (BENICAR HCT) 40-12.5 MG tablet; Take 1 tablet by mouth daily. In place of losartan hctz for bp  Dispense: 90 tablet; Refill: 1  7. Depression, major, recurrent, mild (HCC)  - escitalopram (LEXAPRO) 20 MG tablet; Take 1 tablet (20 mg total) by mouth daily.  Dispense: 90 tablet; Refill: 1 - buPROPion (WELLBUTRIN XL) 300 MG 24 hr tablet; Take 1 tablet (300 mg total) by mouth daily. New dose, instead of 3 of 150 mg  Dispense: 90 tablet; Refill:  1  8. B12 deficiency  - cyanocobalamin ((VITAMIN B-12)) injection 1,000 mcg  9. RLS (restless legs syndrome)  Doing better   10. Morbid obesity ([place])  BMI above 35 with co-morbidities   11. Mouth lesion  Likely aphthous ulcers  12. Colon cancer screening  - Ambulatory referral to Gastroenterology  13. Hypertension associated with diabetes ([place])  - metoprolol succinate (TOPROL-XL) 50 MG 24 hr tablet; Take 1 tablet (50 mg total) by mouth every evening. Take with or immediately following a meal.   Dispense: 90 tablet; Refill: 1 - amLODipine (NORVASC) 2.5 MG tablet; Take 1 tablet (2.5 mg total) by mouth daily.  Dispense: 90 tablet; Refill: 1 - olmesartan-hydrochlorothiazide (BENICAR HCT) 40-12.5 MG tablet; Take 1 tablet by mouth daily. In place of losartan hctz for bp  Dispense: 90 tablet; Refill: 1

## 2020-03-15 LAB — LIPID PANEL
Cholesterol: 112 mg/dL (ref <200)
HDL: 28 mg/dL — ABNORMAL LOW (ref >=40)
LDL Cholesterol (Calc): 60 mg/dL (calc)
Non-HDL Cholesterol (Calc): 84 mg/dL (calc) (ref <130)
Total CHOL/HDL Ratio: 4 (calc) (ref <5.0)
Triglycerides: 154 mg/dL — ABNORMAL HIGH (ref <150)

## 2020-03-15 LAB — COMPLETE METABOLIC PANEL WITH GFR
AG Ratio: 1.7 (calc) (ref 1.0–2.5)
ALT: 55 U/L — ABNORMAL HIGH (ref 9–46)
AST: 41 U/L — ABNORMAL HIGH (ref 10–35)
Albumin: 4 g/dL (ref 3.6–5.1)
Alkaline phosphatase (APISO): 100 U/L (ref 35–144)
BUN: 13 mg/dL (ref 7–25)
CO2: 30 mmol/L (ref 20–32)
Calcium: 9.6 mg/dL (ref 8.6–10.3)
Chloride: 100 mmol/L (ref 98–110)
Creat: 0.7 mg/dL (ref 0.70–1.25)
GFR, Est African American: 112 mL/min/{1.73_m2} (ref >=60)
GFR, Est Non African American: 97 mL/min/{1.73_m2} (ref >=60)
Globulin: 2.4 g/dL (calc) (ref 1.9–3.7)
Glucose, Bld: 150 mg/dL — ABNORMAL HIGH (ref 65–99)
Potassium: 4.1 mmol/L (ref 3.5–5.3)
Sodium: 136 mmol/L (ref 135–146)
Total Bilirubin: 1.4 mg/dL — ABNORMAL HIGH (ref 0.2–1.2)
Total Protein: 6.4 g/dL (ref 6.1–8.1)

## 2020-03-15 LAB — MICROALBUMIN / CREATININE URINE RATIO
Creatinine, Urine: 84 mg/dL (ref 20–320)
Microalb Creat Ratio: 24 mcg/mg creat (ref <30)
Microalb, Ur: 2 mg/dL

## 2020-03-19 ENCOUNTER — Other Ambulatory Visit: Payer: Self-pay | Admitting: Family Medicine

## 2020-03-19 DIAGNOSIS — G894 Chronic pain syndrome: Secondary | ICD-10-CM | POA: Diagnosis not present

## 2020-03-19 DIAGNOSIS — I2510 Atherosclerotic heart disease of native coronary artery without angina pectoris: Secondary | ICD-10-CM

## 2020-03-21 ENCOUNTER — Telehealth: Payer: Medicare Other

## 2020-03-21 ENCOUNTER — Ambulatory Visit
Admission: RE | Admit: 2020-03-21 | Discharge: 2020-03-21 | Disposition: A | Discharge status: Home / Self Care | Payer: Medicare Other | Source: Ambulatory Visit | Attending: Family Medicine | Admitting: Family Medicine

## 2020-03-21 ENCOUNTER — Other Ambulatory Visit: Payer: Self-pay

## 2020-03-21 DIAGNOSIS — K118 Other diseases of salivary glands: Secondary | ICD-10-CM | POA: Diagnosis not present

## 2020-03-21 DIAGNOSIS — K111 Hypertrophy of salivary gland: Secondary | ICD-10-CM | POA: Insufficient documentation

## 2020-03-21 DIAGNOSIS — K1370 Unspecified lesions of oral mucosa: Secondary | ICD-10-CM | POA: Diagnosis not present

## 2020-03-21 LAB — POCT I-STAT CREATININE: Creatinine, Ser: 0.8 mg/dL (ref 0.61–1.24)

## 2020-03-21 MED ORDER — IOHEXOL 300 MG/ML  SOLN
75.0000 mL | Freq: Once | INTRAMUSCULAR | Status: AC | PRN
  Administered 2020-03-21: 75 mL via INTRAVENOUS

## 2020-03-25 DIAGNOSIS — E119 Type 2 diabetes mellitus without complications: Secondary | ICD-10-CM | POA: Diagnosis not present

## 2020-03-25 DIAGNOSIS — D37030 Neoplasm of uncertain behavior of the parotid salivary glands: Secondary | ICD-10-CM | POA: Diagnosis not present

## 2020-03-26 DIAGNOSIS — G4733 Obstructive sleep apnea (adult) (pediatric): Secondary | ICD-10-CM | POA: Diagnosis not present

## 2020-03-27 ENCOUNTER — Other Ambulatory Visit: Payer: Self-pay | Admitting: Unknown Physician Specialty

## 2020-03-27 DIAGNOSIS — K118 Other diseases of salivary glands: Secondary | ICD-10-CM

## 2020-04-02 ENCOUNTER — Other Ambulatory Visit: Payer: Self-pay | Admitting: Radiology

## 2020-04-03 ENCOUNTER — Other Ambulatory Visit: Payer: Self-pay

## 2020-04-03 ENCOUNTER — Ambulatory Visit
Admission: RE | Admit: 2020-04-03 | Discharge: 2020-04-03 | Disposition: A | Discharge status: Home / Self Care | Payer: Medicare Other | Source: Ambulatory Visit | Attending: Unknown Physician Specialty | Admitting: Unknown Physician Specialty

## 2020-04-03 ENCOUNTER — Ambulatory Visit: Payer: Medicare Other

## 2020-04-03 DIAGNOSIS — D110 Benign neoplasm of parotid gland: Secondary | ICD-10-CM | POA: Diagnosis not present

## 2020-04-03 DIAGNOSIS — K118 Other diseases of salivary glands: Secondary | ICD-10-CM | POA: Diagnosis not present

## 2020-04-03 NOTE — Discharge Instructions (Signed)
Needle Biopsy, Care After This sheet gives you information about how to care for yourself after your procedure. Your health care provider may also give you more specific instructions. If you have problems or questions, contact your health care provider. What can I expect after the procedure? After the procedure, it is common to have soreness, bruising, or mild pain at the puncture site. This should go away in a few days. Follow these instructions at home: Needle insertion site care   Wash your hands with soap and water before you change your bandage (dressing). If you cannot use soap and water, use hand sanitizer.  Follow instructions from your health care provider about how to take care of your puncture site. This includes: ? When and how to change your dressing. ? When to remove your dressing.  Check your puncture site every day for signs of infection. Check for: ? Redness, swelling, or pain. ? Fluid or blood. ? Pus or a bad smell. ? Warmth. General instructions  Return to your normal activities as told by your health care provider. Ask your health care provider what activities are safe for you.  Do not take baths, swim, or use a hot tub until your health care provider approves. Ask your health care provider if you may take showers. You may only be allowed to take sponge baths.  Take over-the-counter and prescription medicines only as told by your health care provider.  Keep all follow-up visits as told by your health care provider. This is important. Contact a health care provider if:  You have a fever.  You have redness, swelling, or pain at the puncture site that lasts longer than a few days.  You have fluid, blood, or pus coming from your puncture site.  Your puncture site feels warm to the touch. Get help right away if:  You have severe bleeding from the puncture site. Summary  After the procedure, it is common to have soreness, bruising, or mild pain at the puncture  site. This should go away in a few days.  Check your puncture site every day for signs of infection, such as redness, swelling, or pain.  Get help right away if you have severe bleeding from your puncture site. This information is not intended to replace advice given to you by your health care provider. Make sure you discuss any questions you have with your health care provider. Document Revised: 10/01/2017 Document Reviewed: 08/02/2017 Elsevier Patient Education  2020 Elsevier Inc.  

## 2020-04-03 NOTE — Procedures (Signed)
Interventional Radiology Procedure Note  Procedure: US guided core biopsy of left parotid mass.   Complications: None  Estimated Blood Loss: None  Recommendations: - DC home  Signed,  Criselda Peaches, MD

## 2020-04-04 LAB — SURGICAL PATHOLOGY

## 2020-04-09 DIAGNOSIS — D37030 Neoplasm of uncertain behavior of the parotid salivary glands: Secondary | ICD-10-CM | POA: Diagnosis not present

## 2020-04-12 ENCOUNTER — Other Ambulatory Visit: Payer: Self-pay

## 2020-04-12 DIAGNOSIS — K111 Hypertrophy of salivary gland: Secondary | ICD-10-CM

## 2020-04-12 DIAGNOSIS — R220 Localized swelling, mass and lump, head: Secondary | ICD-10-CM | POA: Diagnosis not present

## 2020-04-16 DIAGNOSIS — M169 Osteoarthritis of hip, unspecified: Secondary | ICD-10-CM | POA: Diagnosis not present

## 2020-04-16 DIAGNOSIS — M5136 Other intervertebral disc degeneration, lumbar region: Secondary | ICD-10-CM | POA: Diagnosis not present

## 2020-04-16 DIAGNOSIS — D119 Benign neoplasm of major salivary gland, unspecified: Secondary | ICD-10-CM | POA: Diagnosis not present

## 2020-04-16 DIAGNOSIS — G894 Chronic pain syndrome: Secondary | ICD-10-CM | POA: Diagnosis not present

## 2020-04-17 DIAGNOSIS — E119 Type 2 diabetes mellitus without complications: Secondary | ICD-10-CM | POA: Diagnosis not present

## 2020-04-17 DIAGNOSIS — K219 Gastro-esophageal reflux disease without esophagitis: Secondary | ICD-10-CM | POA: Diagnosis not present

## 2020-04-17 DIAGNOSIS — Z7982 Long term (current) use of aspirin: Secondary | ICD-10-CM | POA: Diagnosis not present

## 2020-04-17 DIAGNOSIS — K1120 Sialoadenitis, unspecified: Secondary | ICD-10-CM | POA: Diagnosis not present

## 2020-04-17 DIAGNOSIS — Z87891 Personal history of nicotine dependence: Secondary | ICD-10-CM | POA: Diagnosis not present

## 2020-04-17 DIAGNOSIS — Z96649 Presence of unspecified artificial hip joint: Secondary | ICD-10-CM | POA: Diagnosis not present

## 2020-04-17 DIAGNOSIS — D110 Benign neoplasm of parotid gland: Secondary | ICD-10-CM | POA: Diagnosis not present

## 2020-04-17 DIAGNOSIS — Z7984 Long term (current) use of oral hypoglycemic drugs: Secondary | ICD-10-CM | POA: Diagnosis not present

## 2020-04-17 DIAGNOSIS — K118 Other diseases of salivary glands: Secondary | ICD-10-CM | POA: Diagnosis not present

## 2020-04-17 DIAGNOSIS — B370 Candidal stomatitis: Secondary | ICD-10-CM | POA: Diagnosis not present

## 2020-04-17 DIAGNOSIS — Z85828 Personal history of other malignant neoplasm of skin: Secondary | ICD-10-CM | POA: Diagnosis not present

## 2020-04-17 DIAGNOSIS — Z955 Presence of coronary angioplasty implant and graft: Secondary | ICD-10-CM | POA: Diagnosis not present

## 2020-04-17 DIAGNOSIS — Z79899 Other long term (current) drug therapy: Secondary | ICD-10-CM | POA: Diagnosis not present

## 2020-04-17 DIAGNOSIS — I2510 Atherosclerotic heart disease of native coronary artery without angina pectoris: Secondary | ICD-10-CM | POA: Diagnosis not present

## 2020-04-17 DIAGNOSIS — I10 Essential (primary) hypertension: Secondary | ICD-10-CM | POA: Diagnosis not present

## 2020-04-17 DIAGNOSIS — G4730 Sleep apnea, unspecified: Secondary | ICD-10-CM | POA: Diagnosis not present

## 2020-04-26 DIAGNOSIS — G4733 Obstructive sleep apnea (adult) (pediatric): Secondary | ICD-10-CM | POA: Diagnosis not present

## 2020-05-02 ENCOUNTER — Ambulatory Visit (INDEPENDENT_AMBULATORY_CARE_PROVIDER_SITE_OTHER): Payer: Medicare Other

## 2020-05-02 ENCOUNTER — Other Ambulatory Visit: Payer: Self-pay

## 2020-05-02 DIAGNOSIS — E538 Deficiency of other specified B group vitamins: Secondary | ICD-10-CM

## 2020-05-02 MED ORDER — CYANOCOBALAMIN 1000 MCG/ML IJ SOLN
1000.0000 ug | Freq: Once | INTRAMUSCULAR | Status: AC
  Administered 2020-05-02: 1000 ug via INTRAMUSCULAR

## 2020-05-03 DIAGNOSIS — K219 Gastro-esophageal reflux disease without esophagitis: Secondary | ICD-10-CM | POA: Diagnosis not present

## 2020-05-03 DIAGNOSIS — F32A Depression, unspecified: Secondary | ICD-10-CM | POA: Diagnosis not present

## 2020-05-03 DIAGNOSIS — G4730 Sleep apnea, unspecified: Secondary | ICD-10-CM | POA: Diagnosis not present

## 2020-05-03 DIAGNOSIS — E785 Hyperlipidemia, unspecified: Secondary | ICD-10-CM | POA: Diagnosis not present

## 2020-05-03 DIAGNOSIS — I10 Essential (primary) hypertension: Secondary | ICD-10-CM | POA: Diagnosis not present

## 2020-05-04 DIAGNOSIS — Z01812 Encounter for preprocedural laboratory examination: Secondary | ICD-10-CM | POA: Diagnosis not present

## 2020-05-07 DIAGNOSIS — K118 Other diseases of salivary glands: Secondary | ICD-10-CM | POA: Diagnosis not present

## 2020-05-07 DIAGNOSIS — E785 Hyperlipidemia, unspecified: Secondary | ICD-10-CM | POA: Diagnosis not present

## 2020-05-07 DIAGNOSIS — Z23 Encounter for immunization: Secondary | ICD-10-CM | POA: Diagnosis not present

## 2020-05-07 DIAGNOSIS — Z794 Long term (current) use of insulin: Secondary | ICD-10-CM | POA: Diagnosis not present

## 2020-05-07 DIAGNOSIS — E119 Type 2 diabetes mellitus without complications: Secondary | ICD-10-CM | POA: Diagnosis not present

## 2020-05-07 DIAGNOSIS — Z96649 Presence of unspecified artificial hip joint: Secondary | ICD-10-CM | POA: Diagnosis not present

## 2020-05-07 DIAGNOSIS — Z79899 Other long term (current) drug therapy: Secondary | ICD-10-CM | POA: Diagnosis not present

## 2020-05-07 DIAGNOSIS — I2510 Atherosclerotic heart disease of native coronary artery without angina pectoris: Secondary | ICD-10-CM | POA: Diagnosis not present

## 2020-05-07 DIAGNOSIS — Z87891 Personal history of nicotine dependence: Secondary | ICD-10-CM | POA: Diagnosis not present

## 2020-05-07 DIAGNOSIS — I96 Gangrene, not elsewhere classified: Secondary | ICD-10-CM | POA: Diagnosis not present

## 2020-05-07 DIAGNOSIS — Z85828 Personal history of other malignant neoplasm of skin: Secondary | ICD-10-CM | POA: Diagnosis not present

## 2020-05-07 DIAGNOSIS — D110 Benign neoplasm of parotid gland: Secondary | ICD-10-CM | POA: Diagnosis not present

## 2020-05-07 DIAGNOSIS — B370 Candidal stomatitis: Secondary | ICD-10-CM | POA: Diagnosis not present

## 2020-05-07 DIAGNOSIS — D119 Benign neoplasm of major salivary gland, unspecified: Secondary | ICD-10-CM | POA: Diagnosis not present

## 2020-05-07 DIAGNOSIS — Z7984 Long term (current) use of oral hypoglycemic drugs: Secondary | ICD-10-CM | POA: Diagnosis not present

## 2020-05-07 DIAGNOSIS — Z955 Presence of coronary angioplasty implant and graft: Secondary | ICD-10-CM | POA: Diagnosis not present

## 2020-05-07 DIAGNOSIS — G4733 Obstructive sleep apnea (adult) (pediatric): Secondary | ICD-10-CM | POA: Diagnosis not present

## 2020-05-07 DIAGNOSIS — Z7901 Long term (current) use of anticoagulants: Secondary | ICD-10-CM | POA: Diagnosis not present

## 2020-05-07 DIAGNOSIS — I10 Essential (primary) hypertension: Secondary | ICD-10-CM | POA: Diagnosis not present

## 2020-05-07 DIAGNOSIS — K219 Gastro-esophageal reflux disease without esophagitis: Secondary | ICD-10-CM | POA: Diagnosis not present

## 2020-05-07 DIAGNOSIS — F32A Depression, unspecified: Secondary | ICD-10-CM | POA: Diagnosis not present

## 2020-05-07 DIAGNOSIS — Z7982 Long term (current) use of aspirin: Secondary | ICD-10-CM | POA: Diagnosis not present

## 2020-05-07 HISTORY — PX: OTHER SURGICAL HISTORY: SHX169

## 2020-05-08 DIAGNOSIS — Z87891 Personal history of nicotine dependence: Secondary | ICD-10-CM | POA: Diagnosis not present

## 2020-05-08 DIAGNOSIS — Z955 Presence of coronary angioplasty implant and graft: Secondary | ICD-10-CM | POA: Diagnosis not present

## 2020-05-08 DIAGNOSIS — E785 Hyperlipidemia, unspecified: Secondary | ICD-10-CM | POA: Diagnosis not present

## 2020-05-08 DIAGNOSIS — E119 Type 2 diabetes mellitus without complications: Secondary | ICD-10-CM | POA: Diagnosis not present

## 2020-05-08 DIAGNOSIS — Z7901 Long term (current) use of anticoagulants: Secondary | ICD-10-CM | POA: Diagnosis not present

## 2020-05-08 DIAGNOSIS — Z85828 Personal history of other malignant neoplasm of skin: Secondary | ICD-10-CM | POA: Diagnosis not present

## 2020-05-08 DIAGNOSIS — Z7982 Long term (current) use of aspirin: Secondary | ICD-10-CM | POA: Diagnosis not present

## 2020-05-08 DIAGNOSIS — Z794 Long term (current) use of insulin: Secondary | ICD-10-CM | POA: Diagnosis not present

## 2020-05-08 DIAGNOSIS — T380X5A Adverse effect of glucocorticoids and synthetic analogues, initial encounter: Secondary | ICD-10-CM | POA: Diagnosis not present

## 2020-05-08 DIAGNOSIS — F32A Depression, unspecified: Secondary | ICD-10-CM | POA: Diagnosis not present

## 2020-05-08 DIAGNOSIS — I2510 Atherosclerotic heart disease of native coronary artery without angina pectoris: Secondary | ICD-10-CM | POA: Diagnosis not present

## 2020-05-08 DIAGNOSIS — Z7984 Long term (current) use of oral hypoglycemic drugs: Secondary | ICD-10-CM | POA: Diagnosis not present

## 2020-05-08 DIAGNOSIS — Z79899 Other long term (current) drug therapy: Secondary | ICD-10-CM | POA: Diagnosis not present

## 2020-05-08 DIAGNOSIS — G4733 Obstructive sleep apnea (adult) (pediatric): Secondary | ICD-10-CM | POA: Diagnosis not present

## 2020-05-08 DIAGNOSIS — Z23 Encounter for immunization: Secondary | ICD-10-CM | POA: Diagnosis not present

## 2020-05-08 DIAGNOSIS — D110 Benign neoplasm of parotid gland: Secondary | ICD-10-CM | POA: Diagnosis not present

## 2020-05-08 DIAGNOSIS — Z96649 Presence of unspecified artificial hip joint: Secondary | ICD-10-CM | POA: Diagnosis not present

## 2020-05-08 DIAGNOSIS — I96 Gangrene, not elsewhere classified: Secondary | ICD-10-CM | POA: Diagnosis not present

## 2020-05-08 DIAGNOSIS — R638 Other symptoms and signs concerning food and fluid intake: Secondary | ICD-10-CM | POA: Diagnosis not present

## 2020-05-08 DIAGNOSIS — I10 Essential (primary) hypertension: Secondary | ICD-10-CM | POA: Diagnosis not present

## 2020-05-08 DIAGNOSIS — B370 Candidal stomatitis: Secondary | ICD-10-CM | POA: Diagnosis not present

## 2020-05-08 DIAGNOSIS — E1165 Type 2 diabetes mellitus with hyperglycemia: Secondary | ICD-10-CM | POA: Diagnosis not present

## 2020-05-08 DIAGNOSIS — K219 Gastro-esophageal reflux disease without esophagitis: Secondary | ICD-10-CM | POA: Diagnosis not present

## 2020-05-09 DIAGNOSIS — I2510 Atherosclerotic heart disease of native coronary artery without angina pectoris: Secondary | ICD-10-CM | POA: Diagnosis not present

## 2020-05-09 DIAGNOSIS — Z96649 Presence of unspecified artificial hip joint: Secondary | ICD-10-CM | POA: Diagnosis not present

## 2020-05-09 DIAGNOSIS — E119 Type 2 diabetes mellitus without complications: Secondary | ICD-10-CM | POA: Diagnosis not present

## 2020-05-09 DIAGNOSIS — Z794 Long term (current) use of insulin: Secondary | ICD-10-CM | POA: Diagnosis not present

## 2020-05-09 DIAGNOSIS — K219 Gastro-esophageal reflux disease without esophagitis: Secondary | ICD-10-CM | POA: Diagnosis not present

## 2020-05-09 DIAGNOSIS — R638 Other symptoms and signs concerning food and fluid intake: Secondary | ICD-10-CM | POA: Diagnosis not present

## 2020-05-09 DIAGNOSIS — I10 Essential (primary) hypertension: Secondary | ICD-10-CM | POA: Diagnosis not present

## 2020-05-09 DIAGNOSIS — Z85828 Personal history of other malignant neoplasm of skin: Secondary | ICD-10-CM | POA: Diagnosis not present

## 2020-05-09 DIAGNOSIS — F32A Depression, unspecified: Secondary | ICD-10-CM | POA: Diagnosis not present

## 2020-05-09 DIAGNOSIS — E1165 Type 2 diabetes mellitus with hyperglycemia: Secondary | ICD-10-CM | POA: Diagnosis not present

## 2020-05-09 DIAGNOSIS — Z7984 Long term (current) use of oral hypoglycemic drugs: Secondary | ICD-10-CM | POA: Diagnosis not present

## 2020-05-09 DIAGNOSIS — B370 Candidal stomatitis: Secondary | ICD-10-CM | POA: Diagnosis not present

## 2020-05-09 DIAGNOSIS — D110 Benign neoplasm of parotid gland: Secondary | ICD-10-CM | POA: Diagnosis not present

## 2020-05-09 DIAGNOSIS — Z7982 Long term (current) use of aspirin: Secondary | ICD-10-CM | POA: Diagnosis not present

## 2020-05-09 DIAGNOSIS — Z87891 Personal history of nicotine dependence: Secondary | ICD-10-CM | POA: Diagnosis not present

## 2020-05-09 DIAGNOSIS — I96 Gangrene, not elsewhere classified: Secondary | ICD-10-CM | POA: Diagnosis not present

## 2020-05-09 DIAGNOSIS — G4733 Obstructive sleep apnea (adult) (pediatric): Secondary | ICD-10-CM | POA: Diagnosis not present

## 2020-05-09 DIAGNOSIS — Z79899 Other long term (current) drug therapy: Secondary | ICD-10-CM | POA: Diagnosis not present

## 2020-05-09 DIAGNOSIS — Z7901 Long term (current) use of anticoagulants: Secondary | ICD-10-CM | POA: Diagnosis not present

## 2020-05-09 DIAGNOSIS — E785 Hyperlipidemia, unspecified: Secondary | ICD-10-CM | POA: Diagnosis not present

## 2020-05-09 DIAGNOSIS — Z23 Encounter for immunization: Secondary | ICD-10-CM | POA: Diagnosis not present

## 2020-05-09 DIAGNOSIS — Z955 Presence of coronary angioplasty implant and graft: Secondary | ICD-10-CM | POA: Diagnosis not present

## 2020-05-09 DIAGNOSIS — T380X5A Adverse effect of glucocorticoids and synthetic analogues, initial encounter: Secondary | ICD-10-CM | POA: Diagnosis not present

## 2020-05-10 ENCOUNTER — Telehealth: Payer: Self-pay | Admitting: Family Medicine

## 2020-05-10 NOTE — Telephone Encounter (Signed)
Since pt had surgery, pt's blood sugar has been running high. His dr at [facility] suggested he get the new freestyle Libre monitor to check his sugar.  Pt would like to know if Dr Ancil Boozer will order for him.  [place], [place] Phone:  [phone]  Fax:  [phone]

## 2020-05-13 ENCOUNTER — Other Ambulatory Visit: Payer: Self-pay | Admitting: Family Medicine

## 2020-05-13 DIAGNOSIS — E1169 Type 2 diabetes mellitus with other specified complication: Secondary | ICD-10-CM

## 2020-05-13 DIAGNOSIS — G2581 Restless legs syndrome: Secondary | ICD-10-CM

## 2020-05-13 NOTE — Telephone Encounter (Signed)
Spoke with wife because Jimmy Green was sleep. She said they will call back if they need to schedule an appointment. Says they think with was another medication causing the issue

## 2020-05-14 DIAGNOSIS — M47897 Other spondylosis, lumbosacral region: Secondary | ICD-10-CM | POA: Diagnosis not present

## 2020-05-14 DIAGNOSIS — M792 Neuralgia and neuritis, unspecified: Secondary | ICD-10-CM | POA: Diagnosis not present

## 2020-05-14 DIAGNOSIS — M5136 Other intervertebral disc degeneration, lumbar region: Secondary | ICD-10-CM | POA: Diagnosis not present

## 2020-05-14 DIAGNOSIS — M79669 Pain in unspecified lower leg: Secondary | ICD-10-CM | POA: Diagnosis not present

## 2020-05-14 DIAGNOSIS — M169 Osteoarthritis of hip, unspecified: Secondary | ICD-10-CM | POA: Diagnosis not present

## 2020-05-14 DIAGNOSIS — G8929 Other chronic pain: Secondary | ICD-10-CM | POA: Diagnosis not present

## 2020-05-14 DIAGNOSIS — M79609 Pain in unspecified limb: Secondary | ICD-10-CM | POA: Diagnosis not present

## 2020-05-14 DIAGNOSIS — G894 Chronic pain syndrome: Secondary | ICD-10-CM | POA: Diagnosis not present

## 2020-05-14 DIAGNOSIS — M4726 Other spondylosis with radiculopathy, lumbar region: Secondary | ICD-10-CM | POA: Diagnosis not present

## 2020-05-14 DIAGNOSIS — M25519 Pain in unspecified shoulder: Secondary | ICD-10-CM | POA: Diagnosis not present

## 2020-05-14 DIAGNOSIS — M25559 Pain in unspecified hip: Secondary | ICD-10-CM | POA: Diagnosis not present

## 2020-05-14 DIAGNOSIS — M4807 Spinal stenosis, lumbosacral region: Secondary | ICD-10-CM | POA: Diagnosis not present

## 2020-05-14 DIAGNOSIS — M48062 Spinal stenosis, lumbar region with neurogenic claudication: Secondary | ICD-10-CM | POA: Diagnosis not present

## 2020-05-15 DIAGNOSIS — Z09 Encounter for follow-up examination after completed treatment for conditions other than malignant neoplasm: Secondary | ICD-10-CM | POA: Diagnosis not present

## 2020-05-15 DIAGNOSIS — D119 Benign neoplasm of major salivary gland, unspecified: Secondary | ICD-10-CM | POA: Diagnosis not present

## 2020-05-15 DIAGNOSIS — K118 Other diseases of salivary glands: Secondary | ICD-10-CM | POA: Diagnosis not present

## 2020-05-17 ENCOUNTER — Other Ambulatory Visit: Payer: Self-pay | Admitting: Family Medicine

## 2020-05-17 DIAGNOSIS — K219 Gastro-esophageal reflux disease without esophagitis: Secondary | ICD-10-CM

## 2020-05-17 DIAGNOSIS — R059 Cough, unspecified: Secondary | ICD-10-CM

## 2020-05-20 ENCOUNTER — Ambulatory Visit: Payer: Medicare Other | Attending: Internal Medicine

## 2020-05-20 DIAGNOSIS — Z23 Encounter for immunization: Secondary | ICD-10-CM

## 2020-05-20 NOTE — Progress Notes (Signed)
   Covid-19 Vaccination Clinic  Name:  Jimmy Green    MRN: [id number] DOB: 1950/12/26  05/20/2020  Mr. Jimmy Green was observed post Covid-19 immunization for 15 minutes without incident. He was provided with Vaccine Information Sheet and instruction to access the V-Safe system.   Mr. Jimmy Green was instructed to call 911 with any severe reactions post vaccine: [name] Difficulty breathing  . Swelling of face and throat  . A fast heartbeat  . A bad rash all over body  . Dizziness and weakness

## 2020-05-23 ENCOUNTER — Other Ambulatory Visit: Payer: Self-pay

## 2020-05-23 ENCOUNTER — Ambulatory Visit (INDEPENDENT_AMBULATORY_CARE_PROVIDER_SITE_OTHER): Payer: Medicare Other | Admitting: Emergency Medicine

## 2020-05-23 DIAGNOSIS — E538 Deficiency of other specified B group vitamins: Secondary | ICD-10-CM

## 2020-05-23 MED ORDER — CYANOCOBALAMIN 1000 MCG/ML IJ SOLN
1000.0000 ug | Freq: Once | INTRAMUSCULAR | Status: AC
  Administered 2020-05-23: 1000 ug via INTRAMUSCULAR

## 2020-06-03 ENCOUNTER — Other Ambulatory Visit: Payer: Self-pay | Admitting: Family Medicine

## 2020-06-03 DIAGNOSIS — G4733 Obstructive sleep apnea (adult) (pediatric): Secondary | ICD-10-CM | POA: Diagnosis not present

## 2020-06-03 DIAGNOSIS — R059 Cough, unspecified: Secondary | ICD-10-CM

## 2020-06-03 DIAGNOSIS — K219 Gastro-esophageal reflux disease without esophagitis: Secondary | ICD-10-CM

## 2020-06-10 ENCOUNTER — Ambulatory Visit: Payer: Medicare Other | Admitting: Family Medicine

## 2020-06-10 NOTE — Progress Notes (Deleted)
Name: Jimmy Green   MRN: [id number]    DOB: 10-07-1950   Date:06/10/2020       Progress Note  Subjective  Chief Complaint  Follow up   HPI Depression Major:taking medication daily, no suicidal thoughts or ideation,he is currently in remission. PHq9is zero, he has been working in his yard. He is worried about his sister that recently was diagnosed with possible cancer/ ovaries/pathology report pending   RLS:he is taking Gabapentin 300 mg qhs and it helps with symptoms, taking it prn now   GERD/Cough: he is doing well on Omeprazole , no longer coughing. Unchanged   Dyslipidemia with type II DM: he denies polyphagia, polydipsia or polyuria. Hislast A1C went from 6.9 % to 7.2 % and again 7.2 % , today it is  7.8%   , he continues to drink sodas throughout the day, discussed healthier diet, he will try to cut down on regular sodas and we increased  Metformin from 500 to 750 mg ER on his last visit but since still not at goal, discussed adding another medication but he prefers going up on Metformin He has been [place] daily, ARBand statin therapy. He has not been checking his glucose lately. Eye exam is up to date   HTN: she has been taking medication daily. BP is very high today, he is not sure why. No chest pain, palpitation or sob. Not wearing CPAP , we will switch from Losartan hctz to Benicar hctz , continue metoprolol and norvasc   Hyperlipidemia:he was taking Atorvastatin  LDL was not at goal, so we changed to Rosuvastatin 40 mg and is due for repeat labs    OSA: he got a CPAP mask was not fitting properly and causing right forehead pain , ordered by Dr. Jacqualine Code is now waiting to get another mask, having problems contacting company that did the study, but supposedly it will be ready the end of August   History of basal cell carcinoma: he was seeing Dr. Nicole Kindred every 6 months but lost to follow up, explained importance of going back. He states he will call her back    CAD: taking aspirin, statin and beta blocker and ARB,and he has NTG in his pocket, but has not used it yet.. Nochest pain or decrease in exercise tolerance. He saw Dr. Ubaldo Glassing 2019 , and had another heart cath that showed no significant disease, 55-70% stenosis on the LAD. He went back 11/2017 and advised to have CT angiogram done at [facility] but did not get the appointment . We will try to contact Dr. Ubaldo Glassing about it   Left hip replacement history :under the care of Dr. Primus Bravo on pain medication and pain today is3/10, still under the care of pain clinic, symptoms usually present during activitybetter with rest   BPH: reviewed last labs PSA was 1.7   Morbid Obesity: his BMI is above 35 with co-morbidities, explained need to cut down on sodas, still drinking two 20 oz bottles daily , discussed importance of weight loss . He has DM, HTN, OA, cAD    B12 : he is off otc supplementation, we will resume B12 injections   Oral irritation : patient states since he started taking sub-lingual B12 he noticed white spots and oral irritation, but since he stopped symptoms improved. Only one lesion since, we will give him B12 injection instead   Left parotid mass: he noticed enlargement of left side of face about 2 months ago, non tender, no redness, he denies dry mouth,  no fever or chills. He states not sure if changing in size, we ordered a CT scan, that will be done on 08/19 at 2 pm and he has an appointment scheduled with ENT on the 24 th *** Patient Active Problem List   Diagnosis Date Noted  . Parotid mass 04/17/2020  . BPH with obstruction/lower urinary tract symptoms 05/20/2018  . Basal cell carcinoma 03/28/2018  . History of basal cell carcinoma 03/23/2018  . Primary localized osteoarthritis of left hip 04/06/2017  . Dyslipidemia associated with type 2 diabetes mellitus ([place]) 10/04/2016  . Trigger thumb of both thumbs 06/02/2016  . Chronic tension-type headache, intractable 09/25/2015  .  Benign paroxysmal positional vertigo due to bilateral vestibular disorder 09/25/2015  . Hearing loss sensory, bilateral 08/26/2015  . Cerebral microvascular disease 08/26/2015  . Metabolic syndrome 34/91/7915  . Vertigo 06/21/2015  . Buzzing in ear 06/21/2015  . Hypertension goal BP (blood pressure) < 140/90 03/28/2015  . GERD (gastroesophageal reflux disease) 03/28/2015  . Hyperlipidemia 03/28/2015  . Depression, major 03/28/2015  . CAD in native artery 03/28/2015  . Actinic keratosis 02/14/2015  . Obstructive sleep apnea 02/03/2015  . Diverticulosis of colon without hemorrhage 01/31/2015  . Hx of colonic polyps   . Benign neoplasm of descending colon   . Benign neoplasm of sigmoid colon   . Idiopathic colitis   . Sebaceous cyst 09/14/2014    Past Surgical History:  Procedure Laterality Date  . BACK SURGERY    . BASAL CELL CARCINOMA EXCISION Left 12/02/2015   Chest-Done by Dermatologist   . BASAL CELL CARCINOMA EXCISION Left 02/21/2018   Nodulocytic pattern, deep margin involved - [facility] - Dr. Brendolyn Patty  . BASAL CELL CARCINOMA EXCISION  02/23/2018  . CARDIAC CATHETERIZATION Left 04/01/2016   Procedure: Left Heart Cath and Coronary Angiography;  Surgeon: Teodoro Spray, MD;  Location: [place] [facility];  Service: Cardiovascular;  Laterality: Left;  . CARDIAC CATHETERIZATION N/A 04/01/2016   Procedure: Intravascular Pressure Wire/FFR Study;  Surgeon: Isaias Cowman, MD;  Location: [place] [facility];  Service: Cardiovascular;  Laterality: N/A;  . COLONOSCOPY N/A 01/15/2015   Wohl-ileitis, 2 benign polyps, cryptitis, sigmoid diverticulosis, focal ulceration ICV  . CORONARY STENT PLACEMENT    . FRACTIONAL FLOW RESERVE WIRE  10/08/2011   Procedure: FRACTIONAL FLOW RESERVE WIRE;  Surgeon: Clent Demark, MD;  Location: [facility] CATH LAB;  Service: Cardiovascular;;  . HERNIA REPAIR    . KNEE SURGERY    . LEFT HEART CATHETERIZATION WITH CORONARY ANGIOGRAM N/A  10/08/2011   Procedure: LEFT HEART CATHETERIZATION WITH CORONARY ANGIOGRAM;  Surgeon: Clent Demark, MD;  Location: [place] CATH LAB;  Service: Cardiovascular;  Laterality: N/A;  . SKIN CANCER EXCISION  01/2018   [facility]  . TOTAL HIP ARTHROPLASTY Left 04/06/2017   Procedure: TOTAL HIP ARTHROPLASTY ANTERIOR APPROACH;  Surgeon: Hessie Knows, MD;  Location: [facility] ORS;  Service: Orthopedics;  Laterality: Left;    Family History  Problem Relation Age of Onset  . Lung cancer Father   . Heart disease Father   . Skin cancer Father     Social History   Tobacco Use  . Smoking status: Former Smoker    Years: 40.00    Types: Cigarettes    Quit date: 08/03/2012    Years since quitting: 7.8  . Smokeless tobacco: Never Used  Substance Use Topics  . Alcohol use: No    Alcohol/week: 0.0 standard drinks     Current Outpatient Medications:  .  Accu-Chek FastClix Lancets MISC, USE AS INSTRUCTED ONCE DAILY, Disp: 102 each, Rfl: 12 .  ACCU-CHEK GUIDE test strip, USE AS INSTRUCTED ONCE DAILY, Disp: 100 strip, Rfl: 12 .  amLODipine (NORVASC) 2.5 MG tablet, Take 1 tablet (2.5 mg total) by mouth daily., Disp: 90 tablet, Rfl: 1 .  aspirin EC 81 MG tablet, Take 81 mg by mouth every morning. , Disp: , Rfl:  .  buPROPion (WELLBUTRIN XL) 300 MG 24 hr tablet, Take 1 tablet (300 mg total) by mouth daily. New dose, instead of 3 of 150 mg, Disp: 90 tablet, Rfl: 1 .  diclofenac sodium (VOLTAREN) 1 % GEL, Apply topically., Disp: , Rfl:  .  escitalopram (LEXAPRO) 20 MG tablet, Take 1 tablet (20 mg total) by mouth daily., Disp: 90 tablet, Rfl: 1 .  gabapentin (NEURONTIN) 300 MG capsule, TAKE 1 CAPSULE (300 MG TOTAL) BY MOUTH AT BEDTIME., Disp: 90 capsule, Rfl: 1 .  Lancets MISC, See admin instructions., Disp: , Rfl:  .  metFORMIN (GLUCOPHAGE-XR) 750 MG 24 hr tablet, Take 2 tablets (1,500 mg total) by mouth daily with breakfast., Disp: 180 tablet, Rfl: 0 .  metoprolol succinate (TOPROL-XL) 50 MG 24 hr tablet,  Take 1 tablet (50 mg total) by mouth every evening. Take with or immediately following a meal., Disp: 90 tablet, Rfl: 1 .  NARCAN 4 MG/0.1ML LIQD nasal spray kit, , Disp: , Rfl: 0 .  nitroGLYCERIN (NITROSTAT) 0.4 MG SL tablet, DISSOLVE 1 TABLET UNDER THE TONGUE EVERY 5 MINUTES FOR UP TO 3 DOSES AS NEEDED FOR CHEST PAIN. IF NO RELIEF AFTER 3 DOSES, CALL 911 OR GO TO, Disp: 25 tablet, Rfl: 0 .  olmesartan-hydrochlorothiazide (BENICAR HCT) 40-12.5 MG tablet, Take 1 tablet by mouth daily. In place of losartan hctz for bp, Disp: 90 tablet, Rfl: 1 .  omeprazole (PRILOSEC) 20 MG capsule, TAKE 1 CAPSULE BY MOUTH 2 TIMES DAILY BEFORE A MEAL., Disp: 180 capsule, Rfl: 1 .  OVER THE COUNTER MEDICATION, hylands restless legs, Disp: , Rfl:  .  oxyCODONE (ROXICODONE) 15 MG immediate release tablet, Take 1 tablet by mouth 5 (five) times daily as needed., Disp: , Rfl:  .  PREVIDENT 5000 SENSITIVE 1.1-5 % PSTE, Apply 1 each topically 2 (two) times daily., Disp: , Rfl: 5 .  rosuvastatin (CRESTOR) 40 MG tablet, Take 1 tablet (40 mg total) by mouth daily., Disp: 90 tablet, Rfl: 3  Allergies  Allergen Reactions  . Elavil [Amitriptyline Hcl] Rash  . Tape Rash    Paper Tape    I personally reviewed {Reviewed:14835} with the patient/caregiver today.   ROS  ***  Objective  There were no vitals filed for this visit.  There is no height or weight on file to calculate BMI.  Physical Exam ***  Recent Results (from the past 2160 hour(s))  POCT HgB A1C     Status: Abnormal   Collection Time: 03/14/20  8:53 AM  Result Value Ref Range   Hemoglobin A1C 7.8 (A) 4.0 - 5.6 %   HbA1c POC (<> result, manual entry)     HbA1c, POC (prediabetic range)     HbA1c, POC (controlled diabetic range)    Lipid panel     Status: Abnormal   Collection Time: 03/14/20  9:16 AM  Result Value Ref Range   Cholesterol 112 <200 mg/dL   HDL 28 (L) > OR = 40 mg/dL   Triglycerides 154 (H) <150 mg/dL   LDL Cholesterol (Calc) 60  mg/dL (calc)  Comment: Reference range: <100 . Desirable range <100 mg/dL for primary prevention;   <70 mg/dL for patients with CHD or diabetic patients  with > or = 2 CHD risk factors. [name] LDL-C is now calculated using the Martin-Hopkins  calculation, which is a validated novel method providing  better accuracy than the Friedewald equation in the  estimation of LDL-C.  Cresenciano Genre et al. Annamaria Helling. 8099;833(82): 2061-2068  (http://education.[employer].com/faq/FAQ164)    Total CHOL/HDL Ratio 4.0 <5.0 (calc)   Non-HDL Cholesterol (Calc) 84 <130 mg/dL (calc)    Comment: For patients with diabetes plus 1 major ASCVD risk  factor, treating to a non-HDL-C goal of <100 mg/dL  (LDL-C of <70 mg/dL) is considered a therapeutic  option.   Microalbumin / creatinine urine ratio     Status: None   Collection Time: 03/14/20  9:16 AM  Result Value Ref Range   Creatinine, Urine 84 20 - 320 mg/dL   Microalb, Ur 2.0 mg/dL    Comment: Reference Range Not established    Microalb Creat Ratio 24 <30 mcg/mg creat    Comment: . The ADA defines abnormalities in albumin excretion as follows: [name] Category         Result (mcg/mg creatinine) . Normal                    <30 Microalbuminuria         30-299  Clinical albuminuria   > OR = 300 . The ADA recommends that at least two of three specimens collected within a 3-6 month period be abnormal before considering a patient to be within a diagnostic category.   COMPLETE METABOLIC PANEL WITH GFR     Status: Abnormal   Collection Time: 03/14/20  9:16 AM  Result Value Ref Range   Glucose, Bld 150 (H) 65 - 99 mg/dL    Comment: .            Fasting reference interval . For someone without known diabetes, a glucose value >125 mg/dL indicates that they may have diabetes and this should be confirmed with a follow-up test. .    BUN 13 7 - 25 mg/dL   Creat 0.70 0.70 - 1.25 mg/dL    Comment: For patients >40 years of age, the reference limit for  Creatinine is approximately 13% higher for people identified as African-American. .    GFR, Est Non African American 97 > OR = 60 mL/min/1.70m   GFR, Est African American 112 > OR = 60 mL/min/1.755m  BUN/Creatinine Ratio NOT APPLICABLE 6 - 22 (calc)   Sodium 136 135 - 146 mmol/L   Potassium 4.1 3.5 - 5.3 mmol/L   Chloride 100 98 - 110 mmol/L   CO2 30 20 - 32 mmol/L   Calcium 9.6 8.6 - 10.3 mg/dL   Total Protein 6.4 6.1 - 8.1 g/dL   Albumin 4.0 3.6 - 5.1 g/dL   Globulin 2.4 1.9 - 3.7 g/dL (calc)   AG Ratio 1.7 1.0 - 2.5 (calc)   Total Bilirubin 1.4 (H) 0.2 - 1.2 mg/dL   Alkaline phosphatase (APISO) 100 35 - 144 U/L   AST 41 (H) 10 - 35 U/L   ALT 55 (H) 9 - 46 U/L  I-STAT creatinine     Status: None   Collection Time: 03/21/20  1:51 PM  Result Value Ref Range   Creatinine, Ser 0.80 0.61 - 1.24 mg/dL  Surgical pathology     Status: None   Collection Time: 04/03/20  2:01 PM  Result Value Ref Range   SURGICAL PATHOLOGY      SURGICAL PATHOLOGY CASE: [id number] PATIENT: [name] Surgical Pathology Report     Specimen Submitted: A. Parotid mass, left  Clinical History: Parotid mass    DIAGNOSIS: A. PAROTID GLAND, LEFT; ULTRASOUND-GUIDED CORE NEEDLE BIOPSY: - BENIGN WARTHIN TUMOR (PAPILLARY CYSTADENOMA LYMPHOMATOSUM). - NEGATIVE FOR MALIGNANCY.  GROSS DESCRIPTION: A. Labeled: Left parotid Received: Formalin Tissue fragment(s): Multiple Size: Aggregate, 1.5 x 0.4 x 0.2 cm Description: Received are multiple fragmented cores of tan soft tissue. Entirely submitted in cassettes 1-2.      Final Diagnosis performed by Allena Napoleon, MD.   Electronically signed 04/04/2020 8:12:31AM The electronic signature indicates that the named Attending Pathologist has evaluated the specimen Technical component performed at [facility], [address], [place], [place] [address] Lab: [phone] Dir: Rush Farmer, MD, MMM  Professional component performed at [facility], [facility], [place], [place], [place] [address] Lab: [phone] Dir: Dellia Nims. Rubinas, MD     Diabetic Foot Exam: Diabetic Foot Exam - Simple   No data filed     ***  PHQ2/9: Depression screen [facility] 2/9 03/14/2020 03/06/2020 01/12/2020 11/13/2019 07/14/2019  Decreased Interest 0 0 0 0 0  Down, Depressed, Hopeless 0 0 0 0 0  PHQ - 2 Score 0 0 0 0 0  Altered sleeping 0 0 0 0 0  Tired, decreased energy 0 0 0 0 2  Change in appetite 0 0 0 0 0  Feeling bad or failure about yourself  0 0 0 0 0  Trouble concentrating 0 0 0 0 0  Moving slowly or fidgety/restless 0 0 0 0 0  Suicidal thoughts 0 0 0 0 0  PHQ-9 Score 0 0 0 0 2  Difficult doing work/chores - - - - Somewhat difficult  Some recent data might be hidden    phq 9 is {gen pos ULA:453646} ***  Fall Risk: Fall Risk  03/14/2020 03/06/2020 01/12/2020 11/13/2019 06/23/2019  Falls in the past year? 0 0 0 0 0  Number falls in past yr: 0 0 0 0 0  Injury with Fall? 0 0 - 0 0  Risk for fall due to : - - - - Orthopedic patient  Follow up - - - - Falls prevention discussed   ***   Functional Status Survey:   ***   Assessment & Plan  *** There are no diagnoses linked to this encounter.

## 2020-06-11 DIAGNOSIS — Z96642 Presence of left artificial hip joint: Secondary | ICD-10-CM | POA: Diagnosis not present

## 2020-06-11 DIAGNOSIS — M5136 Other intervertebral disc degeneration, lumbar region: Secondary | ICD-10-CM | POA: Diagnosis not present

## 2020-06-11 DIAGNOSIS — M9904 Segmental and somatic dysfunction of sacral region: Secondary | ICD-10-CM | POA: Diagnosis not present

## 2020-06-11 DIAGNOSIS — M169 Osteoarthritis of hip, unspecified: Secondary | ICD-10-CM | POA: Diagnosis not present

## 2020-06-12 ENCOUNTER — Encounter: Payer: Self-pay | Admitting: Dermatology

## 2020-06-12 ENCOUNTER — Ambulatory Visit (INDEPENDENT_AMBULATORY_CARE_PROVIDER_SITE_OTHER): Payer: Medicare Other | Admitting: Dermatology

## 2020-06-12 ENCOUNTER — Other Ambulatory Visit: Payer: Self-pay

## 2020-06-12 DIAGNOSIS — L820 Inflamed seborrheic keratosis: Secondary | ICD-10-CM | POA: Diagnosis not present

## 2020-06-12 DIAGNOSIS — L738 Other specified follicular disorders: Secondary | ICD-10-CM

## 2020-06-12 DIAGNOSIS — L720 Epidermal cyst: Secondary | ICD-10-CM | POA: Diagnosis not present

## 2020-06-12 DIAGNOSIS — Z85828 Personal history of other malignant neoplasm of skin: Secondary | ICD-10-CM | POA: Diagnosis not present

## 2020-06-12 DIAGNOSIS — L578 Other skin changes due to chronic exposure to nonionizing radiation: Secondary | ICD-10-CM | POA: Diagnosis not present

## 2020-06-12 NOTE — Patient Instructions (Signed)
Cryotherapy Aftercare   Wash gently with soap and water everyday.    Apply Vaseline and Band-Aid daily until healed.  Prior to procedure, discussed risks of blister formation, small wound, skin dyspigmentation, or rare scar following cryotherapy.   Seborrheic Keratosis  What causes seborrheic keratoses? Seborrheic keratoses are harmless, common skin growths that first appear during adult life.  As time goes by, more growths appear.  Some people may develop a large number of them.  Seborrheic keratoses appear on both covered and uncovered body parts.  They are not caused by sunlight.  The tendency to develop seborrheic keratoses can be inherited.  They vary in color from skin-colored to gray, brown, or even black.  They can be either smooth or have a rough, warty surface.   Seborrheic keratoses are superficial and look as if they were stuck on the skin.  Under the microscope this type of keratosis looks like layers upon layers of skin.  That is why at times the top layer may seem to fall off, but the rest of the growth remains and re-grows.    Treatment Seborrheic keratoses do not need to be treated, but can easily be removed in the office.  Seborrheic keratoses often cause symptoms when they rub on clothing or jewelry.  Lesions can be in the way of shaving.  If they become inflamed, they can cause itching, soreness, or burning.  Removal of a seborrheic keratosis can be accomplished by freezing, burning, or surgery. If any spot bleeds, scabs, or grows rapidly, please return to have it checked, as these can be an indication of a skin cancer.

## 2020-06-12 NOTE — Progress Notes (Signed)
   Follow-Up Visit   Subjective  Jimmy Green is a 69 y.o. male who presents for the following: Skin Problem (Check white bump on nose. 4-5 months. Non tender, does not bleed. ) and lesions (neck, chest. Rubbed, irritated, itching.). He has a h/o BCC L post shoulder.    The following portions of the chart were reviewed this encounter and updated as appropriate:     Review of Systems: No other skin or systemic complaints except as noted in HPI or Assessment and Plan.  Objective  Well appearing patient in no apparent distress; mood and affect are within normal limits.  A focused examination was performed including face, neck, chest, arms. Relevant physical exam findings are noted in the Assessment and Plan.  Objective  Dorsum of Nose: 2.37mm white papule  Objective  left upper back x1, left upper chest x2 (3): Erythematous keratotic or waxy stuck-on papule    Assessment & Plan  Milia Dorsum of Nose  Acne/Milia surgery - Dorsum of Nose Procedure risks and benefits were discussed with the patient and verbal consent was obtained. Following prep of the skin on the nose with an alcohol swab, extraction of milia was performed with a comedone extractor following superficial incision made over their surfaces with a #11 surgical blade. Capillary hemostasis was achieved with 20% aluminum chloride solution. Vaseline ointment was applied to each site. The patient tolerated the procedure well.  Inflamed seborrheic keratosis (3) left upper back x1, left upper chest x2  Destruction of lesion - left upper back x1, left upper chest x2  Destruction method: cryotherapy   Informed consent: discussed and consent obtained   Lesion destroyed using liquid nitrogen: Yes   Region frozen until ice ball extended beyond lesion: Yes   Outcome: patient tolerated procedure well with no complications   Post-procedure details: wound care instructions given    History of Basal Cell Carcinoma of the  Skin - No evidence of recurrence today at left posterior shoulder - Recommend regular full body skin exams - Recommend daily broad spectrum sunscreen SPF 30+ to sun-exposed areas, reapply every 2 hours as needed.  - Call if any new or changing lesions are noted between office visits  Actinic Damage - chronic, secondary to cumulative UV radiation exposure/sun exposure over time - diffuse scaly erythematous macules with underlying dyspigmentation - Recommend daily broad spectrum sunscreen SPF 30+ to sun-exposed areas, reapply every 2 hours as needed.  - Call for new or changing lesions.  Sebaceous Hyperplasia - Small yellow papules with a central dell - Benign - Observe  Return in about 3 months (around 09/12/2020) for upper body exam, h/o BCC.   I, Emelia Salisbury, CMA, am acting as scribe for Brendolyn Patty, MD.  Documentation: I have reviewed the above documentation for accuracy and completeness, and I agree with the above.  Brendolyn Patty MD

## 2020-06-25 ENCOUNTER — Ambulatory Visit: Payer: Medicare Other

## 2020-07-01 ENCOUNTER — Other Ambulatory Visit: Payer: Self-pay

## 2020-07-01 ENCOUNTER — Ambulatory Visit (INDEPENDENT_AMBULATORY_CARE_PROVIDER_SITE_OTHER): Payer: Medicare Other

## 2020-07-01 DIAGNOSIS — E538 Deficiency of other specified B group vitamins: Secondary | ICD-10-CM

## 2020-07-01 MED ORDER — CYANOCOBALAMIN 1000 MCG/ML IJ SOLN
1000.0000 ug | Freq: Once | INTRAMUSCULAR | Status: AC
  Administered 2020-07-01: 1000 ug via INTRAMUSCULAR

## 2020-07-09 DIAGNOSIS — M169 Osteoarthritis of hip, unspecified: Secondary | ICD-10-CM | POA: Diagnosis not present

## 2020-07-09 DIAGNOSIS — M5136 Other intervertebral disc degeneration, lumbar region: Secondary | ICD-10-CM | POA: Diagnosis not present

## 2020-07-09 DIAGNOSIS — Z96642 Presence of left artificial hip joint: Secondary | ICD-10-CM | POA: Diagnosis not present

## 2020-07-09 DIAGNOSIS — M9904 Segmental and somatic dysfunction of sacral region: Secondary | ICD-10-CM | POA: Diagnosis not present

## 2020-07-15 ENCOUNTER — Ambulatory Visit (INDEPENDENT_AMBULATORY_CARE_PROVIDER_SITE_OTHER): Payer: Medicare Other | Admitting: Family Medicine

## 2020-07-15 ENCOUNTER — Encounter: Payer: Self-pay | Admitting: Family Medicine

## 2020-07-15 ENCOUNTER — Other Ambulatory Visit: Payer: Self-pay

## 2020-07-15 VITALS — BP 138/86 | HR 67 | Temp 98.1°F | Resp 18 | Ht 67.0 in | Wt 226.9 lb

## 2020-07-15 DIAGNOSIS — E538 Deficiency of other specified B group vitamins: Secondary | ICD-10-CM

## 2020-07-15 DIAGNOSIS — G4730 Sleep apnea, unspecified: Secondary | ICD-10-CM

## 2020-07-15 DIAGNOSIS — I200 Unstable angina: Secondary | ICD-10-CM

## 2020-07-15 DIAGNOSIS — I152 Hypertension secondary to endocrine disorders: Secondary | ICD-10-CM

## 2020-07-15 DIAGNOSIS — I10 Essential (primary) hypertension: Secondary | ICD-10-CM

## 2020-07-15 DIAGNOSIS — E785 Hyperlipidemia, unspecified: Secondary | ICD-10-CM | POA: Diagnosis not present

## 2020-07-15 DIAGNOSIS — K219 Gastro-esophageal reflux disease without esophagitis: Secondary | ICD-10-CM

## 2020-07-15 DIAGNOSIS — F330 Major depressive disorder, recurrent, mild: Secondary | ICD-10-CM

## 2020-07-15 DIAGNOSIS — E1159 Type 2 diabetes mellitus with other circulatory complications: Secondary | ICD-10-CM

## 2020-07-15 DIAGNOSIS — Z1211 Encounter for screening for malignant neoplasm of colon: Secondary | ICD-10-CM | POA: Diagnosis not present

## 2020-07-15 DIAGNOSIS — I2510 Atherosclerotic heart disease of native coronary artery without angina pectoris: Secondary | ICD-10-CM | POA: Diagnosis not present

## 2020-07-15 DIAGNOSIS — E1169 Type 2 diabetes mellitus with other specified complication: Secondary | ICD-10-CM | POA: Diagnosis not present

## 2020-07-15 LAB — POCT GLYCOSYLATED HEMOGLOBIN (HGB A1C): Hemoglobin A1C: 8.2 % — AB (ref 4.0–5.6)

## 2020-07-15 NOTE — Progress Notes (Signed)
Name: Jimmy Green   MRN: [id number]    DOB: [birth year]   Date:07/15/2020       Progress Note  Subjective  Chief Complaint  Follow Up  HPI   Depression Major:taking medication daily, no suicidal thoughts or ideation,he is currently in remission. PHq9is zero, doing well and wants to continue medications  RLS:he is taking Gabapentin 300 mg qhs and it helps with symptoms, taking it prn now Unchanged   GERD/Cough: he is doing well on Omeprazole , denies cough or heartburn   Dyslipidemia with type II DM: he denies polyphagia, polydipsia or polyuria. Hislast A1C went from 6.9 % to 7.2, again 7.2 % , 7.8% and today is up again at 8.2 % , he has lost weight but is still drinking sodas daily, he does not want to change medications, but today he is wiling to take 1500 mg of Metformin .He has been [place] daily, ARBand statin therapy.   HTN: she has been taking medication daily. BP was high and we switched from Valsartan hctz to  Benicar hctz continue metoprolol and norvasc . BP is at goal today   Hyperlipidemia:he was taking Atorvastatin  LDL improved , down to 60 on higher dose of Crestor    OSA: he got a CPAP mask was not fitting properly and causing right forehead pain , ordered by Dr. Jacqualine Code was wearing some before surgery , but since surgery he has been unable to tolerate mask due to pressure around surgical site. Advised him to contact the clinic to get a different masks. He has lost weight and states sleeping better at night   History of basal cell carcinoma: he was seeing Dr. Nicole Kindred every 6 months but lost to follow up, explained importance of going back. He states he will call her back   CAD: taking aspirin, statin and beta blocker and ARB,and he has NTG in his pocket, but has not used it yet.. Nochest pain or decrease in exercise tolerance. He saw Dr. Ubaldo Glassing 2019 , and had another heart cath that showed no significant disease, 55-70% stenosis on the LAD. He  went back 11/2017 and advised to have CT angiogram done at [facility] but did not get the appointment . He did not call him back. LDL at goal   Left hip replacement history :under the care of Dr. Primus Bravo on pain medication and pain today is3-4/10, still under the care of pain clinic, symptoms usually present during activitybetter with rest, worse when cold    BPH: reviewed last labs PSA was 1.7 , he will return for a CPE and repeat PSA   Morbid Obesity: his BMI is above 35 with co-morbidities, he has lost 9 lbs over the past 4 months, he states since parotid surgery his lower left lip is numb and is difficulty to chew, he also has noticed decrease in appetite. . He has DM, HTN, OA, CAD    B12 : he is off otc supplementation because it cause oral lesions, he is getting monthly B12 injections without problems   History of left parodectomy on 05/07/2020 , it was done in [facility] , since the surgery he has noticed lower lip numbness and some palsy on left side of face, stable . 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 Parotid mass 04/17/2020  . BPH with obstruction/lower urinary tract symptoms 05/20/2018  . Basal cell carcinoma 03/28/2018  . History of basal cell carcinoma 03/23/2018  . Primary localized osteoarthritis of left hip 04/06/2017  . Dyslipidemia associated with type 2 diabetes mellitus ([place]) 10/04/2016  . Trigger thumb of both thumbs 06/02/2016  . Chronic tension-type headache, intractable 09/25/2015   . Benign paroxysmal positional vertigo due to bilateral vestibular disorder 09/25/2015  . Hearing loss sensory, bilateral 08/26/2015  . Cerebral microvascular disease 08/26/2015  . Metabolic syndrome 93/81/0175  . Vertigo 06/21/2015  . Buzzing in ear 06/21/2015  . Hypertension goal BP (blood pressure) < 140/90 03/28/2015  . GERD (gastroesophageal reflux disease) 03/28/2015  . Hyperlipidemia 03/28/2015  . Depression, major 03/28/2015  . CAD in native artery 03/28/2015  . Actinic keratosis 02/14/2015  . Obstructive sleep apnea 02/03/2015  . Diverticulosis of colon without hemorrhage 01/31/2015  . Hx of colonic polyps   . Benign neoplasm of descending colon   . Benign neoplasm of sigmoid colon   . Idiopathic colitis   . Sebaceous cyst 09/14/2014    Past Surgical History:  Procedure Laterality Date  . BACK SURGERY    . BASAL CELL CARCINOMA EXCISION Left 12/02/2015   Chest-Done by Dermatologist   . BASAL CELL CARCINOMA EXCISION Left 02/21/2018   Nodulocytic pattern, deep margin involved - [facility] - Dr. Brendolyn Patty  . BASAL CELL CARCINOMA EXCISION  02/23/2018  . CARDIAC CATHETERIZATION Left 04/01/2016   Procedure: Left Heart Cath and Coronary Angiography;  Surgeon: Teodoro Spray, MD;  Location: [place] [facility];  Service: Cardiovascular;  Laterality: Left;  . CARDIAC CATHETERIZATION N/A 04/01/2016   Procedure: Intravascular Pressure Wire/FFR Study;  Surgeon: Isaias Cowman, MD;  Location: [place] [facility];  Service: Cardiovascular;  Laterality: N/A;  . COLONOSCOPY N/A 01/15/2015   Wohl-ileitis, 2 benign polyps, cryptitis, sigmoid diverticulosis, focal ulceration ICV  . CORONARY STENT PLACEMENT    . FRACTIONAL FLOW RESERVE WIRE  10/08/2011   Procedure: FRACTIONAL FLOW RESERVE WIRE;  Surgeon: Clent Demark, MD;  Location: [facility] CATH LAB;  Service: Cardiovascular;;  . HERNIA REPAIR    . KNEE SURGERY    . LEFT HEART CATHETERIZATION WITH CORONARY ANGIOGRAM N/A  10/08/2011   Procedure: LEFT HEART CATHETERIZATION WITH CORONARY ANGIOGRAM;  Surgeon: Clent Demark, MD;  Location: [place] CATH LAB;  Service: Cardiovascular;  Laterality: N/A;  . parodectomy Left 05/07/2020  . SKIN CANCER EXCISION  01/2018   [facility]  . TOTAL HIP ARTHROPLASTY Left 04/06/2017   Procedure: TOTAL HIP ARTHROPLASTY ANTERIOR APPROACH;  Surgeon: Hessie Knows, MD;  Location: [facility] ORS;  Service: Orthopedics;  Laterality: Left;    Family History  Problem Relation Age of Onset  . Lung cancer Father   . Heart disease Father   . Skin cancer Father     Social History   Tobacco Use  . Smoking status: Former Smoker    Years: 40.00    Types: Cigarettes    Quit date: 08/03/2012    Years since quitting: 7.9  . Smokeless tobacco: Never Used  Substance Use Topics  . Alcohol use: No    Alcohol/week: 0.0 standard drinks  Current Outpatient Medications:  .  Accu-Chek FastClix Lancets MISC, USE AS INSTRUCTED ONCE DAILY, Disp: 102 each, Rfl: 12 .  ACCU-CHEK GUIDE test strip, USE AS INSTRUCTED ONCE DAILY, Disp: 100 strip, Rfl: 12 .  amLODipine (NORVASC) 2.5 MG tablet, Take 1 tablet (2.5 mg total) by mouth daily., Disp: 90 tablet, Rfl: 1 .  aspirin EC 81 MG tablet, Take 81 mg by mouth every morning. , Disp: , Rfl:  .  buPROPion (WELLBUTRIN XL) 300 MG 24 hr tablet, Take 1 tablet (300 mg total) by mouth daily. New dose, instead of 3 of 150 mg, Disp: 90 tablet, Rfl: 1 .  escitalopram (LEXAPRO) 20 MG tablet, Take 1 tablet (20 mg total) by mouth daily., Disp: 90 tablet, Rfl: 1 .  gabapentin (NEURONTIN) 300 MG capsule, TAKE 1 CAPSULE (300 MG TOTAL) BY MOUTH AT BEDTIME., Disp: 90 capsule, Rfl: 1 .  Lancets MISC, See admin instructions., Disp: , Rfl:  .  metFORMIN (GLUCOPHAGE-XR) 750 MG 24 hr tablet, Take 2 tablets (1,500 mg total) by mouth daily with breakfast., Disp: 180 tablet, Rfl: 0 .  metoprolol succinate (TOPROL-XL) 50 MG 24 hr tablet, Take 1 tablet (50 mg total) by mouth every  evening. Take with or immediately following a meal., Disp: 90 tablet, Rfl: 1 .  nitroGLYCERIN (NITROSTAT) 0.4 MG SL tablet, DISSOLVE 1 TABLET UNDER THE TONGUE EVERY 5 MINUTES FOR UP TO 3 DOSES AS NEEDED FOR CHEST PAIN. IF NO RELIEF AFTER 3 DOSES, CALL 911 OR GO TO, Disp: 25 tablet, Rfl: 0 .  olmesartan-hydrochlorothiazide (BENICAR HCT) 40-12.5 MG tablet, Take 1 tablet by mouth daily. In place of losartan hctz for bp, Disp: 90 tablet, Rfl: 1 .  omeprazole (PRILOSEC) 20 MG capsule, TAKE 1 CAPSULE BY MOUTH 2 TIMES DAILY BEFORE A MEAL., Disp: 180 capsule, Rfl: 1 .  oxyCODONE (ROXICODONE) 15 MG immediate release tablet, Take 1 tablet by mouth 5 (five) times daily as needed., Disp: , Rfl:  .  PREVIDENT 5000 SENSITIVE 1.1-5 % PSTE, Apply 1 each topically 2 (two) times daily., Disp: , Rfl: 5 .  rosuvastatin (CRESTOR) 40 MG tablet, Take 1 tablet (40 mg total) by mouth daily., Disp: 90 tablet, Rfl: 3 .  diclofenac sodium (VOLTAREN) 1 % GEL, Apply topically. (Patient not taking: Reported on 07/15/2020), Disp: , Rfl:  .  NARCAN 4 MG/0.1ML LIQD nasal spray kit, , Disp: , Rfl: 0 .  OVER THE COUNTER MEDICATION, hylands restless legs (Patient not taking: Reported on 07/15/2020), Disp: , Rfl:   Allergies  Allergen Reactions  . Elavil [Amitriptyline Hcl] Rash  . Tape Rash    Paper Tape    I personally reviewed active problem list, medication list, allergies, family history, social history, health maintenance with the patient/caregiver today.   ROS  Constitutional: Negative for fever , positive for weight change.  Respiratory: Negative for cough and shortness of breath.   Cardiovascular: Negative for chest pain or palpitations.  Gastrointestinal: Negative for abdominal pain, no bowel changes.  Musculoskeletal: Negative for gait problem or joint swelling.  Skin: Negative for rash.  Neurological: Negative for dizziness or headache.  No other specific complaints in a complete review of systems (except as  listed in HPI above).  Objective  Vitals:   07/15/20 0833  BP: 138/86  Pulse: 67  Resp: 18  Temp: 98.1 F (36.7 C)  TempSrc: Oral  SpO2: 96%  Weight: 226 lb 14.4 oz (102.9 kg)  Height: 5' 7" (1.702 m)    Body mass index  is 35.54 kg/m.  Physical Exam  Constitutional: Patient appears well-developed and well-nourished. Obese  No distress.  HEENT: head atraumatic, normocephalic, pupils equal and reactive to light,  neck supple. Left side of face has some scar tissue on the area of surgery  Cardiovascular: Normal rate, regular rhythm and normal heart sounds.  No murmur heard. No BLE edema. Pulmonary/Chest: Effort normal and breath sounds normal. No respiratory distress. Abdominal: Soft.  There is no tenderness. Neurological: lower lip is numb and weak  Psychiatric: Patient has a normal mood and affect. behavior is normal. Judgment and thought content normal.  Recent Results (from the past 2160 hour(s))  POCT HgB A1C     Status: Abnormal   Collection Time: 07/15/20  8:48 AM  Result Value Ref Range   Hemoglobin A1C 8.2 (A) 4.0 - 5.6 %   HbA1c POC (<> result, manual entry)     HbA1c, POC (prediabetic range)     HbA1c, POC (controlled diabetic range)      Diabetic Foot Exam: Diabetic Foot Exam - Simple   Simple Foot Form Diabetic Foot exam was performed with the following findings: Yes 07/15/2020  9:24 AM  Visual Inspection No deformities, no ulcerations, no other skin breakdown bilaterally: Yes Sensation Testing Intact to touch and monofilament testing bilaterally: Yes Pulse Check Posterior Tibialis and Dorsalis pulse intact bilaterally: Yes Comments      PHQ2/9: Depression screen PHQ 2/9 07/15/2020 03/14/2020 03/06/2020 01/12/2020 11/13/2019  Decreased Interest 0 0 0 0 0  Down, Depressed, Hopeless 0 0 0 0 0  PHQ - 2 Score 0 0 0 0 0  Altered sleeping - 0 0 0 0  Tired, decreased energy - 0 0 0 0  Change in appetite - 0 0 0 0  Feeling bad or failure about yourself  - 0  0 0 0  Trouble concentrating - 0 0 0 0  Moving slowly or fidgety/restless - 0 0 0 0  Suicidal thoughts - 0 0 0 0  PHQ-9 Score - 0 0 0 0  Difficult doing work/chores - - - - -  Some recent data might be hidden    phq 9 is negative   Fall Risk: Fall Risk  07/15/2020 03/14/2020 03/06/2020 01/12/2020 11/13/2019  Falls in the past year? 0 0 0 0 0  Number falls in past yr: 0 0 0 0 0  Injury with Fall? 0 0 0 - 0  Risk for fall due to : - - - - -  Follow up - - - - -     Functional Status Survey: Is the patient deaf or have difficulty hearing?: Yes Does the patient have difficulty seeing, even when wearing glasses/contacts?: No Does the patient have difficulty concentrating, remembering, or making decisions?: No Does the patient have difficulty walking or climbing stairs?: Yes Does the patient have difficulty dressing or bathing?: No Does the patient have difficulty doing errands alone such as visiting a doctor's office or shopping?: No    Assessment & Plan  1. Dyslipidemia associated with type 2 diabetes mellitus (HCC)  - POCT HgB A1C - [facility] Diabetes Foot Exam  2. Colon cancer screening  - Ambulatory referral to Gastroenterology  3. CAD in native artery   4. Sleep apnea in adult   5. Unstable angina (HCC)  No problems, on medication  6. B12 deficiency  Getting B12 monthly   7. Depression, major, recurrent, mild (HCC)   8. Morbid obesity (HCC)  Discussed with the patient the risk   posed by an increased BMI. Discussed importance of portion control, calorie counting and at least 150 minutes of physical activity weekly. Avoid sweet beverages and drink more water. Eat at least 6 servings of fruit and vegetables daily   9. Gastroesophageal reflux disease without esophagitis   10. Hypertension, benign  Controlled   11. Hypertension associated with diabetes (HCC)  He will take two metformins daily as prescribed and cut down on sodas  

## 2020-07-23 ENCOUNTER — Ambulatory Visit (INDEPENDENT_AMBULATORY_CARE_PROVIDER_SITE_OTHER): Payer: Medicare Other

## 2020-07-23 ENCOUNTER — Telehealth (INDEPENDENT_AMBULATORY_CARE_PROVIDER_SITE_OTHER): Payer: Self-pay | Admitting: Gastroenterology

## 2020-07-23 ENCOUNTER — Other Ambulatory Visit: Payer: Self-pay

## 2020-07-23 DIAGNOSIS — Z86010 Personal history of colonic polyps: Secondary | ICD-10-CM

## 2020-07-23 DIAGNOSIS — E538 Deficiency of other specified B group vitamins: Secondary | ICD-10-CM

## 2020-07-23 MED ORDER — CYANOCOBALAMIN 1000 MCG/ML IJ SOLN
1000.0000 ug | Freq: Once | INTRAMUSCULAR | Status: AC
  Administered 2020-07-23: 12:00:00 1000 ug via INTRAMUSCULAR

## 2020-07-23 NOTE — Progress Notes (Signed)
Gastroenterology Pre-Procedure Review  Request Date: Tuesday 08/20/20 Requesting Physician: Dr. Allen Norris  PATIENT REVIEW QUESTIONS: The patient responded to the following health history questions as indicated:    1. Are you having any GI issues? no 2. Do you have a personal history of Polyps? yes (01/15/15 colon polyps noted on colonoscopy report performed by Dr. Allen Norris) 3. Do you have a family history of Colon Cancer or Polyps? no 4. Diabetes Mellitus? yes (type 2) 5. Joint replacements in the past 12 months?no 6. Major health problems in the past 3 months?no 7. Any artificial heart valves, MVP, or defibrillator?no    MEDICATIONS & ALLERGIES:    Patient reports the following regarding taking any anticoagulation/antiplatelet therapy:   Plavix, Coumadin, Eliquis, Xarelto, Lovenox, Pradaxa, Brilinta, or Effient? no Aspirin? yes (81 mg daily)  Patient confirms/reports the following medications:  Current Outpatient Medications  Medication Sig Dispense Refill   Accu-Chek FastClix Lancets MISC USE AS INSTRUCTED ONCE DAILY 102 each 12   ACCU-CHEK GUIDE test strip USE AS INSTRUCTED ONCE DAILY 100 strip 12   amLODipine (NORVASC) 2.5 MG tablet Take 1 tablet (2.5 mg total) by mouth daily. 90 tablet 1   aspirin EC 81 MG tablet Take 81 mg by mouth every morning.      buPROPion (WELLBUTRIN XL) 300 MG 24 hr tablet Take 1 tablet (300 mg total) by mouth daily. New dose, instead of 3 of 150 mg 90 tablet 1   escitalopram (LEXAPRO) 20 MG tablet Take 1 tablet (20 mg total) by mouth daily. 90 tablet 1   Lancets MISC See admin instructions.     metFORMIN (GLUCOPHAGE-XR) 750 MG 24 hr tablet Take 2 tablets (1,500 mg total) by mouth daily with breakfast. 180 tablet 0   metoprolol succinate (TOPROL-XL) 50 MG 24 hr tablet Take 1 tablet (50 mg total) by mouth every evening. Take with or immediately following a meal. 90 tablet 1   nitroGLYCERIN (NITROSTAT) 0.4 MG SL tablet DISSOLVE 1 TABLET UNDER THE TONGUE  EVERY 5 MINUTES FOR UP TO 3 DOSES AS NEEDED FOR CHEST PAIN. IF NO RELIEF AFTER 3 DOSES, CALL 911 OR GO TO 25 tablet 0   olmesartan-hydrochlorothiazide (BENICAR HCT) 40-12.5 MG tablet Take 1 tablet by mouth daily. In place of losartan hctz for bp 90 tablet 1   omeprazole (PRILOSEC) 20 MG capsule TAKE 1 CAPSULE BY MOUTH 2 TIMES DAILY BEFORE A MEAL. 180 capsule 1   oxyCODONE (ROXICODONE) 15 MG immediate release tablet Take 1 tablet by mouth 5 (five) times daily as needed.     rosuvastatin (CRESTOR) 40 MG tablet Take 1 tablet (40 mg total) by mouth daily. 90 tablet 3   diclofenac sodium (VOLTAREN) 1 % GEL Apply topically. (Patient not taking: No sig reported)     gabapentin (NEURONTIN) 300 MG capsule TAKE 1 CAPSULE (300 MG TOTAL) BY MOUTH AT BEDTIME. 90 capsule 1   NARCAN 4 MG/0.1ML LIQD nasal spray kit  (Patient not taking: No sig reported)  0   OVER THE COUNTER MEDICATION hylands restless legs (Patient not taking: No sig reported)     PREVIDENT 5000 SENSITIVE 1.1-5 % PSTE Apply 1 each topically 2 (two) times daily.  5   No current facility-administered medications for this visit.    Patient confirms/reports the following allergies:  Allergies  Allergen Reactions   Elavil [Amitriptyline Hcl] Rash   Tape Rash    Paper Tape    No orders of the defined types were placed in this encounter.  AUTHORIZATION INFORMATION Primary Insurance: 1D#: Group #:  Secondary Insurance: 1D#: Group #:  SCHEDULE INFORMATION: Date: 08/20/20 Time: Location:[facility]

## 2020-07-23 NOTE — Progress Notes (Signed)
Corrected Triage  Gastroenterology Pre-Procedure Review  Request Date: 08/20/20 Requesting Physician: Dr. Servando Snare  PATIENT REVIEW QUESTIONS: The patient responded to the following health history questions as indicated:    1. Are you having any GI issues? No 2. Do you have a personal history of Polyps? yes (yes (history of colon polyps with Dr. Servando Snare 01/05/15)) 3. Do you have a family history of Colon Cancer or Polyps? no 4. Diabetes Mellitus? yes (type 2) 5. 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 Any artificial heart valves, MVP, or defibrillator?no    MEDICATIONS & ALLERGIES:    Patient reports the following regarding taking any anticoagulation/antiplatelet therapy:   Plavix, Coumadin, Eliquis, Xarelto, Lovenox, Pradaxa, Brilinta, or Effient? no Aspirin? yes (81 mg daily)  Patient confirms/reports the following medications:  Current Outpatient Medications  Medication Sig Dispense Refill   Accu-Chek FastClix Lancets MISC USE AS INSTRUCTED ONCE DAILY 102 each 12   ACCU-CHEK GUIDE test strip USE AS INSTRUCTED ONCE DAILY 100 strip 12   amLODipine (NORVASC) 2.5 MG tablet Take 1 tablet (2.5 mg total) by mouth daily. 90 tablet 1   aspirin EC 81 MG tablet Take 81 mg by mouth every morning.      buPROPion (WELLBUTRIN XL) 300 MG 24 hr tablet Take 1 tablet (300 mg total) by mouth daily. New dose, instead of 3 of 150 mg 90 tablet 1   diclofenac sodium (VOLTAREN) 1 % GEL Apply topically. (Patient not taking: No sig reported)     escitalopram (LEXAPRO) 20 MG tablet Take 1 tablet (20 mg total) by mouth daily. 90 tablet 1   gabapentin (NEURONTIN) 300 MG capsule TAKE 1 CAPSULE (300 MG TOTAL) BY MOUTH AT BEDTIME. 90 capsule 1   Lancets MISC See admin instructions.     metFORMIN (GLUCOPHAGE-XR) 750 MG 24 hr tablet Take 2 tablets (1,500  mg total) by mouth daily with breakfast. 180 tablet 0   metoprolol succinate (TOPROL-XL) 50 MG 24 hr tablet Take 1 tablet (50 mg total) by mouth every evening. Take with or immediately following a meal. 90 tablet 1   NARCAN 4 MG/0.1ML LIQD nasal spray kit  (Patient not taking: No sig reported)  0   nitroGLYCERIN (NITROSTAT) 0.4 MG SL tablet DISSOLVE 1 TABLET UNDER THE TONGUE EVERY 5 MINUTES FOR UP TO 3 DOSES AS NEEDED FOR CHEST PAIN. IF NO RELIEF AFTER 3 DOSES, CALL 911 OR GO TO 25 tablet 0   olmesartan-hydrochlorothiazide (BENICAR HCT) 40-12.5 MG tablet Take 1 tablet by mouth daily. In place of losartan hctz for bp 90 tablet 1   omeprazole (PRILOSEC) 20 MG capsule TAKE 1 CAPSULE BY MOUTH 2 TIMES DAILY BEFORE A MEAL. 180 capsule 1   OVER THE COUNTER MEDICATION hylands restless legs (Patient not taking: No sig reported)     oxyCODONE (ROXICODONE) 15 MG immediate release tablet Take 1 tablet by mouth 5 (five) times daily as needed.     PREVIDENT 5000 SENSITIVE 1.1-5 % PSTE Apply 1 each topically 2 (two) times daily.  5   rosuvastatin (CRESTOR) 40 MG tablet Take 1 tablet (40 mg total) by mouth daily. 90 tablet 3   No current facility-administered medications for this visit.    Patient confirms/reports the following allergies:  Allergies  Allergen Reactions   Elavil [Amitriptyline Hcl] Rash   Tape Rash    Paper Tape  No orders of the defined types were placed in this encounter.   AUTHORIZATION INFORMATION Primary Insurance: 1D#: Group #:  Secondary Insurance: 1D#: Group #:  SCHEDULE INFORMATION: Date: 08/20/20 Time: Location:[facility]

## 2020-07-26 DIAGNOSIS — G4733 Obstructive sleep apnea (adult) (pediatric): Secondary | ICD-10-CM | POA: Diagnosis not present

## 2020-08-06 DIAGNOSIS — M5136 Other intervertebral disc degeneration, lumbar region: Secondary | ICD-10-CM | POA: Diagnosis not present

## 2020-08-06 DIAGNOSIS — E1140 Type 2 diabetes mellitus with diabetic neuropathy, unspecified: Secondary | ICD-10-CM | POA: Diagnosis not present

## 2020-08-06 DIAGNOSIS — M9904 Segmental and somatic dysfunction of sacral region: Secondary | ICD-10-CM | POA: Diagnosis not present

## 2020-08-06 DIAGNOSIS — Z96642 Presence of left artificial hip joint: Secondary | ICD-10-CM | POA: Diagnosis not present

## 2020-08-16 ENCOUNTER — Other Ambulatory Visit
Admission: RE | Admit: 2020-08-16 | Discharge: 2020-08-16 | Disposition: A | Discharge status: Home / Self Care | Payer: Medicare Other | Source: Ambulatory Visit | Attending: Gastroenterology | Admitting: Gastroenterology

## 2020-08-16 ENCOUNTER — Other Ambulatory Visit: Payer: Self-pay

## 2020-08-16 DIAGNOSIS — Z01812 Encounter for preprocedural laboratory examination: Secondary | ICD-10-CM | POA: Insufficient documentation

## 2020-08-16 DIAGNOSIS — Z20822 Contact with and (suspected) exposure to covid-19: Secondary | ICD-10-CM | POA: Insufficient documentation

## 2020-08-16 LAB — SARS CORONAVIRUS 2 (TAT 6-24 HRS): SARS Coronavirus 2: NEGATIVE

## 2020-08-20 ENCOUNTER — Encounter: Admission: RE | Payer: Self-pay | Source: Home / Self Care

## 2020-08-20 ENCOUNTER — Ambulatory Visit: Admission: RE | Admit: 2020-08-20 | Payer: Medicare Other | Source: Home / Self Care | Admitting: Gastroenterology

## 2020-08-20 ENCOUNTER — Telehealth: Payer: Self-pay

## 2020-08-20 SURGERY — COLONOSCOPY WITH PROPOFOL
Anesthesia: General

## 2020-08-20 NOTE — Telephone Encounter (Signed)
Patients wife contacted the office yesterday to reschedule her husband Jimmy Green's colonoscopy scheduled for today due to road conditions related to weather.  Call was returned.[name] LVM for her to call the office back to reschedule.  Thanks,  [place], [place]

## 2020-08-21 ENCOUNTER — Telehealth: Payer: Self-pay | Admitting: Gastroenterology

## 2020-08-21 ENCOUNTER — Telehealth: Payer: Self-pay

## 2020-08-21 NOTE — Telephone Encounter (Signed)
Returned patients wife Jan's call in regards to scheduling her husband's repeat colonoscopy due to history of colon polyps with Dr. Allen Norris.  LVM for Jan to call back.  Thanks,  [place], [place]

## 2020-08-21 NOTE — Telephone Encounter (Signed)
Patient's wife called to get Colonoscopy rescheduled due to bad weather, please call.

## 2020-08-23 ENCOUNTER — Other Ambulatory Visit: Payer: Self-pay

## 2020-08-23 DIAGNOSIS — Z86010 Personal history of colonic polyps: Secondary | ICD-10-CM

## 2020-08-26 DIAGNOSIS — G4733 Obstructive sleep apnea (adult) (pediatric): Secondary | ICD-10-CM | POA: Diagnosis not present

## 2020-08-27 ENCOUNTER — Ambulatory Visit (INDEPENDENT_AMBULATORY_CARE_PROVIDER_SITE_OTHER): Payer: Medicare Other

## 2020-08-27 ENCOUNTER — Other Ambulatory Visit
Admission: RE | Admit: 2020-08-27 | Discharge: 2020-08-27 | Disposition: A | Discharge status: Home / Self Care | Payer: Medicare Other | Source: Ambulatory Visit | Attending: Gastroenterology | Admitting: Gastroenterology

## 2020-08-27 ENCOUNTER — Other Ambulatory Visit: Payer: Self-pay

## 2020-08-27 DIAGNOSIS — Z01812 Encounter for preprocedural laboratory examination: Secondary | ICD-10-CM | POA: Insufficient documentation

## 2020-08-27 DIAGNOSIS — E538 Deficiency of other specified B group vitamins: Secondary | ICD-10-CM

## 2020-08-27 DIAGNOSIS — Z20822 Contact with and (suspected) exposure to covid-19: Secondary | ICD-10-CM | POA: Diagnosis not present

## 2020-08-27 LAB — SARS CORONAVIRUS 2 (TAT 6-24 HRS): SARS Coronavirus 2: NEGATIVE

## 2020-08-27 MED ORDER — CYANOCOBALAMIN 1000 MCG/ML IJ SOLN
1000.0000 ug | Freq: Once | INTRAMUSCULAR | Status: AC
  Administered 2020-08-27: 1000 ug via INTRAMUSCULAR

## 2020-08-29 ENCOUNTER — Ambulatory Visit: Payer: Medicare Other | Admitting: Certified Registered"

## 2020-08-29 ENCOUNTER — Encounter: Payer: Self-pay | Admitting: Gastroenterology

## 2020-08-29 ENCOUNTER — Encounter
Admission: RE | Disposition: A | Discharge status: Home / Self Care | Payer: Self-pay | Source: Home / Self Care | Attending: Gastroenterology

## 2020-08-29 ENCOUNTER — Ambulatory Visit
Admission: RE | Admit: 2020-08-29 | Discharge: 2020-08-29 | Disposition: A | Discharge status: Home / Self Care | Payer: Medicare Other | Attending: Gastroenterology | Admitting: Gastroenterology

## 2020-08-29 DIAGNOSIS — K640 First degree hemorrhoids: Secondary | ICD-10-CM | POA: Insufficient documentation

## 2020-08-29 DIAGNOSIS — Z79899 Other long term (current) drug therapy: Secondary | ICD-10-CM | POA: Insufficient documentation

## 2020-08-29 DIAGNOSIS — Z860100 Personal history of colon polyps, unspecified: Secondary | ICD-10-CM

## 2020-08-29 DIAGNOSIS — Z7982 Long term (current) use of aspirin: Secondary | ICD-10-CM | POA: Diagnosis not present

## 2020-08-29 DIAGNOSIS — I10 Essential (primary) hypertension: Secondary | ICD-10-CM | POA: Diagnosis not present

## 2020-08-29 DIAGNOSIS — Z888 Allergy status to other drugs, medicaments and biological substances status: Secondary | ICD-10-CM | POA: Insufficient documentation

## 2020-08-29 DIAGNOSIS — E785 Hyperlipidemia, unspecified: Secondary | ICD-10-CM | POA: Diagnosis not present

## 2020-08-29 DIAGNOSIS — D126 Benign neoplasm of colon, unspecified: Secondary | ICD-10-CM | POA: Diagnosis not present

## 2020-08-29 DIAGNOSIS — M1990 Unspecified osteoarthritis, unspecified site: Secondary | ICD-10-CM | POA: Diagnosis not present

## 2020-08-29 DIAGNOSIS — Z7984 Long term (current) use of oral hypoglycemic drugs: Secondary | ICD-10-CM | POA: Diagnosis not present

## 2020-08-29 DIAGNOSIS — Z1211 Encounter for screening for malignant neoplasm of colon: Secondary | ICD-10-CM | POA: Diagnosis not present

## 2020-08-29 DIAGNOSIS — Z87891 Personal history of nicotine dependence: Secondary | ICD-10-CM | POA: Diagnosis not present

## 2020-08-29 DIAGNOSIS — K635 Polyp of colon: Secondary | ICD-10-CM | POA: Diagnosis not present

## 2020-08-29 HISTORY — PX: COLONOSCOPY WITH PROPOFOL: SHX5780

## 2020-08-29 SURGERY — COLONOSCOPY WITH PROPOFOL
Anesthesia: General

## 2020-08-29 MED ORDER — SODIUM CHLORIDE 0.9 % IV SOLN: INTRAVENOUS | Status: DC

## 2020-08-29 MED ORDER — LIDOCAINE 2% (20 MG/ML) 5 ML SYRINGE
INTRAMUSCULAR | Status: DC | PRN
  Administered 2020-08-29: 25 mg via INTRAVENOUS

## 2020-08-29 MED ORDER — FENTANYL CITRATE (PF) 100 MCG/2ML IJ SOLN
INTRAMUSCULAR | Status: AC
  Filled 2020-08-29: qty 2

## 2020-08-29 MED ORDER — PROPOFOL 500 MG/50ML IV EMUL
INTRAVENOUS | Status: DC | PRN
  Administered 2020-08-29: 120 ug/kg/min via INTRAVENOUS

## 2020-08-29 MED ORDER — PROPOFOL 10 MG/ML IV BOLUS
INTRAVENOUS | Status: DC | PRN
  Administered 2020-08-29: 30 mg via INTRAVENOUS
  Administered 2020-08-29: 70 mg via INTRAVENOUS

## 2020-08-29 MED ORDER — FENTANYL CITRATE (PF) 100 MCG/2ML IJ SOLN
INTRAMUSCULAR | Status: DC | PRN
  Administered 2020-08-29 (×2): 50 ug via INTRAVENOUS

## 2020-08-29 NOTE — Op Note (Signed)
[facility] Patient Name: Jimmy Green Procedure Date: 08/29/2020 9:19 AM MRN: [id number] Account #: [id number] Date of Birth: Sep 19, 1950 Admit Type: Outpatient Age: 70 Room: [facility] ENDO ROOM 1 Gender: Male Note Status: Finalized Procedure:             Colonoscopy Indications:           High risk colon cancer surveillance: Personal history                         of colonic polyps Providers:             Lucilla Lame MD, MD Referring MD:          Bethena Roys. Sowles, MD (Referring MD) Medicines:             Propofol per Anesthesia Complications:         No immediate complications. Procedure:             Pre-Anesthesia Assessment:                        - Prior to the procedure, a History and Physical was                         performed, and patient medications and allergies were                         reviewed. The patient's tolerance of previous                         anesthesia was also reviewed. The risks and benefits                         of the procedure and the sedation options and risks                         were discussed with the patient. All questions were                         answered, and informed consent was obtained. Prior                         Anticoagulants: The patient has taken no previous                         anticoagulant or antiplatelet agents. ASA Grade                         Assessment: II - A patient with mild systemic disease.                         After reviewing the risks and benefits, the patient                         was deemed in satisfactory condition to undergo the                         procedure.  After obtaining informed consent, the colonoscope was                         passed under direct vision. Throughout the procedure,                         the patient's blood pressure, pulse, and oxygen                         saturations were monitored continuously. The                          Colonoscope was introduced through the anus and                         advanced to the the cecum, identified by appendiceal                         orifice and ileocecal valve. The colonoscopy was                         performed without difficulty. The patient tolerated                         the procedure well. The quality of the bowel                         preparation was excellent. Findings:      The perianal and digital rectal examinations were normal.      A 2 mm polyp was found in the transverse colon. The polyp was sessile.       The polyp was removed with a cold snare. Resection and retrieval were       complete.      A 4 mm polyp was found in the ascending colon. The polyp was sessile.       The polyp was removed with a cold snare. Resection and retrieval were       complete.      Non-bleeding internal hemorrhoids were found during retroflexion. The       hemorrhoids were Grade I (internal hemorrhoids that do not prolapse). Impression:            - One 2 mm polyp in the transverse colon, removed with                         a cold snare. Resected and retrieved.                        - One 4 mm polyp in the ascending colon, removed with                         a cold snare. Resected and retrieved.                        - Non-bleeding internal hemorrhoids. Recommendation:        - Discharge patient to home.                        - Resume previous diet.                        -  Continue present medications.                        - Repeat colonoscopy in 7 years for surveillance. Procedure Code(s):     --- Professional ---                        [phone], Colonoscopy, flexible; with removal of                         tumor(s), polyp(s), or other lesion(s) by snare                         technique Diagnosis Code(s):     --- Professional ---                        Z86.010, Personal history of colonic polyps                        K63.5, Polyp of colon CPT  copyright 2019 American Medical Association. All rights reserved. The codes documented in this report are preliminary and upon coder review may  be revised to meet current compliance requirements. Lucilla Lame MD, MD 08/29/2020 9:41:33 AM This report has been signed electronically. Number of Addenda: 0 Note Initiated On: 08/29/2020 9:19 AM Scope Withdrawal Time: 0 hours 5 minutes 42 seconds  Total Procedure Duration: 0 hours 10 minutes 23 seconds  Estimated Blood Loss:  Estimated blood loss: none.      [facility]

## 2020-08-29 NOTE — H&P (Signed)
Lucilla Lame, MD [address] [place], [place] [address] Phone:[phone] Fax : [phone]  Primary Care Physician:  Steele Sizer, MD Primary Gastroenterologist:  Dr. Allen Norris  Pre-Procedure History & Physical: HPI:  Jimmy Green is a 70 y.o. male is here for an colonoscopy.   Past Medical History:  Diagnosis Date  . Anginal pain ([place])   . Basal cell carcinoma 02/21/2018   left posterior shoulder at lat edge of scar  . Coronary artery disease   . Depression   . Diabetes mellitus without complication ([place])   . GERD (gastroesophageal reflux disease)   . Hyperlipidemia   . Hypertension   . Hypogonadism in male   . Metabolic syndrome   . Myocardial infarction ([place])   . Orthopnea   . Seborrheic keratosis   . Skin cancer 2014   nose and right knee  . Sleep apnea     Past Surgical History:  Procedure Laterality Date  . BACK SURGERY    . BASAL CELL CARCINOMA EXCISION Left 12/02/2015   Chest-Done by Dermatologist   . BASAL CELL CARCINOMA EXCISION Left 02/21/2018   Nodulocytic pattern, deep margin involved - [facility] - Dr. Brendolyn Patty  . BASAL CELL CARCINOMA EXCISION  02/23/2018  . CARDIAC CATHETERIZATION Left 04/01/2016   Procedure: Left Heart Cath and Coronary Angiography;  Surgeon: Teodoro Spray, MD;  Location: [facility];  Service: Cardiovascular;  Laterality: Left;  . CARDIAC CATHETERIZATION N/A 04/01/2016   Procedure: Intravascular Pressure Wire/FFR Study;  Surgeon: Isaias Cowman, MD;  Location: [facility];  Service: Cardiovascular;  Laterality: N/A;  . COLONOSCOPY N/A 01/15/2015   Johan Creveling-ileitis, 2 benign polyps, cryptitis, sigmoid diverticulosis, focal ulceration ICV  . CORONARY STENT PLACEMENT    . FRACTIONAL FLOW RESERVE WIRE  10/08/2011   Procedure: FRACTIONAL FLOW RESERVE WIRE;  Surgeon: Clent Demark, MD;  Location: [facility] CATH LAB;  Service: Cardiovascular;;  . HERNIA REPAIR    . KNEE SURGERY    . LEFT  HEART CATHETERIZATION WITH CORONARY ANGIOGRAM N/A 10/08/2011   Procedure: LEFT HEART CATHETERIZATION WITH CORONARY ANGIOGRAM;  Surgeon: Clent Demark, MD;  Location: [facility] CATH LAB;  Service: Cardiovascular;  Laterality: N/A;  . parodectomy Left 05/07/2020  . SKIN CANCER EXCISION  01/2018   [facility]  . TOTAL HIP ARTHROPLASTY Left 04/06/2017   Procedure: TOTAL HIP ARTHROPLASTY ANTERIOR APPROACH;  Surgeon: Hessie Knows, MD;  Location: [facility] ORS;  Service: Orthopedics;  Laterality: Left;    Prior to Admission medications   Medication Sig Start Date End Date Taking? Authorizing Provider  amLODipine (NORVASC) 2.5 MG tablet Take 1 tablet (2.5 mg total) by mouth daily. 03/14/20  Yes Steele Sizer, MD  aspirin EC 81 MG tablet Take 81 mg by mouth every morning.    Yes [provider]  buPROPion (WELLBUTRIN XL) 300 MG 24 hr tablet Take 1 tablet (300 mg total) by mouth daily. New dose, instead of 3 of 150 mg 03/14/20  Yes Sowles, Drue Stager, MD  escitalopram (LEXAPRO) 20 MG tablet Take 1 tablet (20 mg total) by mouth daily. 03/14/20  Yes Sowles, Drue Stager, MD  gabapentin (NEURONTIN) 300 MG capsule TAKE 1 CAPSULE (300 MG TOTAL) BY MOUTH AT BEDTIME. 05/13/20  Yes Sowles, Drue Stager, MD  metFORMIN (GLUCOPHAGE-XR) 750 MG 24 hr tablet Take 2 tablets (1,500 mg total) by mouth daily with breakfast. 03/14/20  Yes Sowles, Drue Stager, MD  metoprolol succinate (TOPROL-XL) 50 MG 24 hr tablet Take 1 tablet (50 mg  total) by mouth every evening. Take with or immediately following a meal. 03/14/20  Yes Sowles, Drue Stager, MD  olmesartan-hydrochlorothiazide (BENICAR HCT) 40-12.5 MG tablet Take 1 tablet by mouth daily. In place of losartan hctz for bp 03/14/20  Yes Sowles, Drue Stager, MD  omeprazole (PRILOSEC) 20 MG capsule TAKE 1 CAPSULE BY MOUTH 2 TIMES DAILY BEFORE A MEAL. 06/03/20  Yes Sowles, Drue Stager, MD  rosuvastatin (CRESTOR) 40 MG tablet Take 1 tablet (40 mg total) by mouth daily. 11/14/19  Yes Steele Sizer, MD   Accu-Chek FastClix Lancets MISC USE AS INSTRUCTED ONCE DAILY 05/13/20   Steele Sizer, MD  ACCU-CHEK GUIDE test strip USE AS INSTRUCTED ONCE DAILY 05/13/20   Steele Sizer, MD  diclofenac sodium (VOLTAREN) 1 % GEL Apply topically. Patient not taking: No sig reported 07/28/18   [provider]  Lancets MISC See admin instructions. 08/18/17   [provider]  NARCAN 4 MG/0.1ML LIQD nasal spray kit  09/06/17   [provider]  nitroGLYCERIN (NITROSTAT) 0.4 MG SL tablet DISSOLVE 1 TABLET UNDER THE TONGUE EVERY 5 MINUTES FOR UP TO 3 DOSES AS NEEDED FOR CHEST PAIN. IF NO RELIEF AFTER 3 DOSES, CALL 911 OR GO TO 03/19/20   Steele Sizer, MD  OVER THE COUNTER MEDICATION hylands restless legs Patient not taking: No sig reported    [provider]  oxyCODONE (ROXICODONE) 15 MG immediate release tablet Take 1 tablet by mouth 5 (five) times daily as needed. 04/17/19   Mohammed Kindle, MD  PREVIDENT 5000 SENSITIVE 1.1-5 % PSTE Apply 1 each topically 2 (two) times daily. 02/19/18   [provider]    Allergies as of 08/23/2020 - Review Complete 07/15/2020  Allergen Reaction Noted  . Elavil [amitriptyline hcl] Rash 07/02/2016  . Tape Rash 05/11/2017    Family History  Problem Relation Age of Onset  . Lung cancer Father   . Heart disease Father   . Skin cancer Father     Social History   Socioeconomic History  . Marital status: Married    Spouse name: Thayer Headings  . Number of children: 1  . Years of education: Not on file  . Highest education level: High school graduate  Occupational History  . Occupation: retired  Tobacco Use  . Smoking status: Former Smoker    Years: 40.00    Types: Cigarettes    Quit date: 08/03/2012    Years since quitting: 8.0  . Smokeless tobacco: Never Used  Vaping Use  . Vaping Use: Never used  Substance and Sexual Activity  . Alcohol use: No    Alcohol/week: 0.0 standard drinks  . Drug use: No  . Sexual activity: Yes     Partners: Female    Birth control/protection: None  Other Topics Concern  . Not on file  Social History Narrative   Wife has 2 children by another marriage and he has 1 child by a previous marriage.   Social Determinants of Health   Financial Resource Strain: Not on file  Food Insecurity: Not on file  Transportation Needs: Not on file  Physical Activity: Not on file  Stress: Not on file  Social Connections: Not on file  Intimate Partner Violence: Not on file    Review of Systems: See HPI, otherwise negative ROS  Physical Exam: BP 128/87   Pulse 69   Temp (!) 97.2 F (36.2 C) (Temporal)   Resp 16   Ht '5\' 9"'  (1.753 m)   Wt 106.6 kg   SpO2 98%  BMI 34.70 kg/m  General:   Alert,  pleasant and cooperative in NAD Head:  Normocephalic and atraumatic. Neck:  Supple; no masses or thyromegaly. Lungs:  Clear throughout to auscultation.    Heart:  Regular rate and rhythm. Abdomen:  Soft, nontender and nondistended. Normal bowel sounds, without guarding, and without rebound.   Neurologic:  Alert and  oriented x4;  grossly normal neurologically.  Impression/Plan: Jimmy Green is here for an colonoscopy to be performed for a history of adenomatous polyps on 01/2015   Risks, benefits, limitations, and alternatives regarding  colonoscopy have been reviewed with the patient.  Questions have been answered.  All parties agreeable.   Lucilla Lame, MD  08/29/2020, 8:42 AM

## 2020-08-29 NOTE — Transfer of Care (Signed)
Immediate Anesthesia Transfer of Care Note  Patient: Jimmy Green  Procedure(s) Performed: COLONOSCOPY WITH PROPOFOL (N/A )  Patient Location: Endoscopy Unit  Anesthesia Type:General  Level of Consciousness: awake and alert   Airway & Oxygen Therapy: Patient Spontanous Breathing  Post-op Assessment: Report given to RN and Post -op Vital signs reviewed and stable  Post vital signs: Reviewed  Last Vitals:  Vitals Value Taken Time  BP 111/65 08/29/20 0943  Temp 36.4 C 08/29/20 0943  Pulse 73 08/29/20 0943  Resp 11 08/29/20 0943  SpO2 96 % 08/29/20 0943  Vitals shown include unvalidated device data.  Last Pain:  Vitals:   08/29/20 0838  TempSrc: Temporal  PainSc: 0-No pain         Complications: No complications documented.

## 2020-08-29 NOTE — Anesthesia Preprocedure Evaluation (Addendum)
Anesthesia Evaluation  Patient identified by MRN, date of birth, ID band Patient awake    Reviewed: Allergy & Precautions, NPO status , Patient's Chart, lab work & pertinent test results, reviewed documented beta blocker date and time   History of Anesthesia Complications Negative for: history of anesthetic complications  Airway Mallampati: III  TM Distance: >3 FB Neck ROM: Full    Dental  (+) Chipped,    Pulmonary sleep apnea , former smoker,  Recently had left jaw surgery and has a hard time wearing CPAP mask.   Pulmonary exam normal breath sounds clear to auscultation       Cardiovascular hypertension, Pt. on medications and Pt. on home beta blockers + angina with exertion + CAD, + Past MI and + Orthopnea  Normal cardiovascular exam Rhythm:Regular Rate:Normal  S/p cardiac stent 2013   Neuro/Psych  Headaches, PSYCHIATRIC DISORDERS Depression negative neurological ROS     GI/Hepatic negative GI ROS, Neg liver ROS, GERD  Medicated and Controlled,  Endo/Other  negative endocrine ROSdiabetes, Well Controlled, Type 2, Oral Hypoglycemic Agents  Renal/GU negative Renal ROS  negative genitourinary   Musculoskeletal  (+) Arthritis , Osteoarthritis,    Abdominal   Peds negative pediatric ROS (+)  Hematology negative hematology ROS (+)   Anesthesia Other Findings Past Medical History: No date: Anginal pain ([place]) 02/21/2018: Basal cell carcinoma     Comment:  left posterior shoulder at lat edge of scar No date: Coronary artery disease No date: Depression No date: Diabetes mellitus without complication ([facility]) No date: GERD (gastroesophageal reflux disease) No date: Hyperlipidemia No date: Hypertension No date: Hypogonadism in male No date: Metabolic syndrome No date: Myocardial infarction ([place]) No date: Orthopnea No date: Seborrheic keratosis 2014: Skin cancer     Comment:  nose and right knee No date: Sleep  apnea  Reproductive/Obstetrics negative OB ROS                            Anesthesia Physical  Anesthesia Plan  ASA: III  Anesthesia Plan: General   Post-op Pain Management:    Induction: Intravenous  PONV Risk Score and Plan:   Airway Management Planned: Nasal Cannula  Additional Equipment:   Intra-op Plan:   Post-operative Plan:   Informed Consent: I have reviewed the patients History and Physical, chart, labs and discussed the procedure including the risks, benefits and alternatives for the proposed anesthesia with the patient or authorized representative who has indicated his/her understanding and acceptance.     Dental advisory given  Plan Discussed with: CRNA and Surgeon  Anesthesia Plan Comments:         Anesthesia Quick Evaluation

## 2020-08-30 ENCOUNTER — Encounter: Payer: Self-pay | Admitting: Gastroenterology

## 2020-08-30 LAB — SURGICAL PATHOLOGY

## 2020-09-01 ENCOUNTER — Encounter: Payer: Self-pay | Admitting: Gastroenterology

## 2020-09-03 DIAGNOSIS — R519 Headache, unspecified: Secondary | ICD-10-CM | POA: Diagnosis not present

## 2020-09-03 DIAGNOSIS — M79669 Pain in unspecified lower leg: Secondary | ICD-10-CM | POA: Diagnosis not present

## 2020-09-03 DIAGNOSIS — M25559 Pain in unspecified hip: Secondary | ICD-10-CM | POA: Diagnosis not present

## 2020-09-03 DIAGNOSIS — M792 Neuralgia and neuritis, unspecified: Secondary | ICD-10-CM | POA: Diagnosis not present

## 2020-09-03 DIAGNOSIS — M79609 Pain in unspecified limb: Secondary | ICD-10-CM | POA: Diagnosis not present

## 2020-09-03 DIAGNOSIS — M4726 Other spondylosis with radiculopathy, lumbar region: Secondary | ICD-10-CM | POA: Diagnosis not present

## 2020-09-03 DIAGNOSIS — G8929 Other chronic pain: Secondary | ICD-10-CM | POA: Diagnosis not present

## 2020-09-03 DIAGNOSIS — M48062 Spinal stenosis, lumbar region with neurogenic claudication: Secondary | ICD-10-CM | POA: Diagnosis not present

## 2020-09-03 DIAGNOSIS — M25519 Pain in unspecified shoulder: Secondary | ICD-10-CM | POA: Diagnosis not present

## 2020-09-03 DIAGNOSIS — M47897 Other spondylosis, lumbosacral region: Secondary | ICD-10-CM | POA: Diagnosis not present

## 2020-09-03 DIAGNOSIS — G894 Chronic pain syndrome: Secondary | ICD-10-CM | POA: Diagnosis not present

## 2020-09-03 DIAGNOSIS — M4807 Spinal stenosis, lumbosacral region: Secondary | ICD-10-CM | POA: Diagnosis not present

## 2020-09-03 NOTE — Anesthesia Postprocedure Evaluation (Signed)
Anesthesia Post Note  Patient: Jimmy Green  Procedure(s) Performed: COLONOSCOPY WITH PROPOFOL (N/A )  Patient location during evaluation: Endoscopy Anesthesia Type: General Level of consciousness: awake and alert and oriented Pain management: pain level controlled Vital Signs Assessment: post-procedure vital signs reviewed and stable Respiratory status: spontaneous breathing Cardiovascular status: blood pressure returned to baseline Anesthetic complications: no   No complications documented.   Last Vitals:  Vitals:   08/29/20 0943 08/29/20 0950  BP: 111/65 116/85  Pulse: 75 71  Resp: 12 11  Temp: (!) 36.4 C   SpO2: 96% 97%    Last Pain:  Vitals:   08/30/20 0756  TempSrc:   PainSc: 0-No pain                 [name]

## 2020-09-06 ENCOUNTER — Ambulatory Visit: Payer: Self-pay | Admitting: *Deleted

## 2020-09-06 ENCOUNTER — Telehealth: Payer: Medicare Other | Admitting: Family Medicine

## 2020-09-06 DIAGNOSIS — R197 Diarrhea, unspecified: Secondary | ICD-10-CM | POA: Diagnosis not present

## 2020-09-06 DIAGNOSIS — U071 COVID-19: Secondary | ICD-10-CM

## 2020-09-06 NOTE — Telephone Encounter (Signed)
Patient was diagnosised with COVID 8 days ago- he has had diarrhea the whole time- patient has been taking imodium.Call to office- advised virtual visit for patient through [facility]- protal.  Reason for Disposition . [1] SEVERE diarrhea (e.g., 7 or more times / day more than normal) AND [2] age > 60 years  Answer Assessment - Initial Assessment Questions 1. DIARRHEA SEVERITY: "How bad is the diarrhea?" "How many extra stools have you had in the past 24 hours than normal?"    - NO DIARRHEA (SCALE 0)   - MILD (SCALE 1-3): Few loose or mushy BMs; increase of 1-3 stools over normal daily number of stools; mild increase in ostomy output.   -  MODERATE (SCALE 4-7): Increase of 4-6 stools daily over normal; moderate increase in ostomy output. * SEVERE (SCALE 8-10; OR 'WORST POSSIBLE'): Increase of 7 or more stools daily over normal; moderate increase in ostomy output; incontinence.     severe 2. ONSET: "When did the diarrhea begin?"      8 days 3. BM CONSISTENCY: "How loose or watery is the diarrhea?"      watery 4. VOMITING: "Are you also vomiting?" If Yes, ask: "How many times in the past 24 hours?"      Did at beginning- but none now 5. ABDOMINAL PAIN: "Are you having any abdominal pain?" If Yes, ask: "What does it feel like?" (e.g., crampy, dull, intermittent, constant)      Yes- at beginning- none now 6. ABDOMINAL PAIN SEVERITY: If present, ask: "How bad is the pain?"  (e.g., Scale 1-10; mild, moderate, or severe)   - MILD (1-3): doesn't interfere with normal activities, abdomen soft and not tender to touch    - MODERATE (4-7): interferes with normal activities or awakens from sleep, tender to touch    - SEVERE (8-10): excruciating pain, doubled over, unable to do any normal activities       None now 7. ORAL INTAKE: If vomiting, "Have you been able to drink liquids?" "How much fluids have you had in the past 24 hours?"     n/a 8. HYDRATION: "Any signs of dehydration?" (e.g., dry mouth [not  just dry lips], too weak to stand, dizziness, new weight loss) "When did you last urinate?"     2 bottle Gatorade  9. EXPOSURE: "Have you traveled to a foreign country recently?" "Have you been exposed to anyone with diarrhea?" "Could you have eaten any food that was spoiled?"     COVID + 10. ANTIBIOTIC USE: "Are you taking antibiotics now or have you taken antibiotics in the past 2 months?"       no 11. OTHER SYMPTOMS: "Do you have any other symptoms?" (e.g., fever, blood in stool)       no 12. PREGNANCY: "Is there any chance you are pregnant?" "When was your last menstrual period?"       n/a  Protocols used: DIARRHEA-A-AH

## 2020-09-06 NOTE — Patient Instructions (Signed)
Recommend a bland diet until stool becomes formed. Advance your diet as tolerated. Continue electrolyte replenishment in the form of gateraid.   Diarrhea, Adult Diarrhea is when you pass loose and watery poop (stool) often. Diarrhea can make you feel weak and cause you to lose water in your body (get dehydrated). Losing water in your body can cause you to:  Feel tired and thirsty.  Have a dry mouth.  Go pee (urinate) less often. Diarrhea often lasts 2-3 days. However, it can last longer if it is a sign of something more serious. It is important to treat your diarrhea as told by your doctor. Follow these instructions at home: Eating and drinking Follow these instructions as told by your doctor:  Take an ORS (oral rehydration solution). This is a drink that helps you replace fluids and minerals your body lost. It is sold at pharmacies and stores.  Drink plenty of fluids, such as: ? Water. ? Ice chips. ? Diluted fruit juice. ? Low-calorie sports drinks. ? Milk, if you want.  Avoid drinking fluids that have a lot of sugar or caffeine in them.  Eat bland, easy-to-digest foods in small amounts as you are able. These foods include: ? Bananas. ? Applesauce. ? Rice. ? Low-fat (lean) meats. ? Toast. ? Crackers.  Avoid alcohol.  Avoid spicy or fatty foods.      Medicines  Take over-the-counter and prescription medicines only as told by your doctor.  If you were prescribed an antibiotic medicine, take it as told by your doctor. Do not stop using the antibiotic even if you start to feel better. General instructions  Wash your hands often using soap and water. If soap and water are not available, use a hand sanitizer. Others in your home should wash their hands as well. Hands should be washed: ? After using the toilet or changing a diaper. ? Before preparing, cooking, or serving food. ? While caring for a sick person. ? While visiting someone in a hospital.  Drink enough fluid  to keep your pee (urine) pale yellow.  Rest at home while you get better.  Watch your condition for any changes.  Take a warm bath to help with any burning or pain from having diarrhea.  Keep all follow-up visits as told by your doctor. This is important.   Contact a doctor if:  You have a fever.  Your diarrhea gets worse.  You have new symptoms.  You cannot keep fluids down.  You feel light-headed or dizzy.  You have a headache.  You have muscle cramps. Get help right away if:  You have chest pain.  You feel very weak or you pass out (faint).  You have bloody or black poop or poop that looks like tar.  You have very bad pain, cramping, or bloating in your belly (abdomen).  You have trouble breathing or you are breathing very quickly.  Your heart is beating very quickly.  Your skin feels cold and clammy.  You feel confused.  You have signs of losing too much water in your body, such as: ? Dark pee, very little pee, or no pee. ? Cracked lips. ? Dry mouth. ? Sunken eyes. ? Sleepiness. ? Weakness. Summary  Diarrhea is when you pass loose and watery poop (stool) often.  Diarrhea can make you feel weak and cause you to lose water in your body (get dehydrated).  Take an ORS (oral rehydration solution). This is a drink that is sold at pharmacies and stores.  Eat bland, easy-to-digest foods in small amounts as you are able.  Contact a doctor if your condition gets worse. Get help right away if you have signs that you have lost too much water in your body. This information is not intended to replace advice given to you by your health care provider. Make sure you discuss any questions you have with your health care provider. Document Revised: 12/24/2017 Document Reviewed: 12/24/2017 Elsevier Patient Education  2021 [employer] Diet A bland diet consists of foods that are often soft and do not have a lot of fat, fiber, or extra seasonings. Foods  without fat, fiber, or seasoning are easier for the body to digest. They are also less likely to irritate your mouth, throat, stomach, and other parts of your digestive system. A bland diet is sometimes called a BRAT diet. What is my plan? Your health care provider or food and nutrition specialist (dietitian) may recommend specific changes to your diet to prevent symptoms or to treat your symptoms. These changes may include:  Eating small meals often.  Cooking food until it is soft enough to chew easily.  Chewing your food well.  Drinking fluids slowly.  Not eating foods that are very spicy, sour, or fatty.  Not eating citrus fruits, such as oranges and grapefruit. What do I need to know about this diet?  Eat a variety of foods from the bland diet food list.  Do not follow a bland diet longer than needed.  Ask your health care provider whether you should take vitamins or supplements. What foods can I eat? Grains Hot cereals, such as cream of wheat. Rice. Bread, crackers, or tortillas made from refined white flour.   Vegetables Canned or cooked vegetables. Mashed or boiled potatoes. Fruits Bananas. Applesauce. Other types of cooked or canned fruit with the skin and seeds removed, such as canned peaches or pears.   Meats and other proteins Scrambled eggs. Creamy peanut butter or other nut butters. Lean, well-cooked meats, such as chicken or fish. Tofu. Soups or broths.   Dairy Low-fat dairy products, such as milk, cottage cheese, or yogurt. Beverages Water. Herbal tea. Apple juice.   Fats and oils Mild salad dressings. Canola or olive oil. Sweets and desserts Pudding. Custard. Fruit gelatin. Ice cream. The items listed above may not be a complete list of recommended foods and beverages. Contact a dietitian for more options. What foods are not recommended? Grains Whole grain breads and cereals. Vegetables Raw vegetables. Fruits Raw fruits, especially citrus, berries, or  dried fruits. Dairy Whole fat dairy foods. Beverages Caffeinated drinks. Alcohol. Seasonings and condiments Strongly flavored seasonings or condiments. Hot sauce. Salsa. Other foods Spicy foods. Fried foods. Sour foods, such as pickled or fermented foods. Foods with high sugar content. Foods high in fiber. The items listed above may not be a complete list of foods and beverages to avoid. Contact a dietitian for more information. Summary  A bland diet consists of foods that are often soft and do not have a lot of fat, fiber, or extra seasonings.  Foods without fat, fiber, or seasoning are easier for the body to digest.  Check with your health care provider to see how long you should follow this diet plan. It is not meant to be followed for long periods. This information is not intended to replace advice given to you by your health care provider. Make sure you discuss any questions you have with your health care provider. Document Revised: 08/18/2017  Document Reviewed: 08/18/2017 Elsevier Patient Education  2021 [employer].

## 2020-09-06 NOTE — Progress Notes (Signed)
Mr. Jimmy, Green are scheduled for a virtual visit with your provider today.    Just as we do with appointments in the office, we must obtain your consent to participate.  Your consent will be active for this visit and any virtual visit you may have with one of our providers in the next 365 days.    If you have a MyChart account, I can also send a copy of this consent to you electronically.  All virtual visits are billed to your insurance company just like a traditional visit in the office.  As this is a virtual visit, video technology does not allow for your provider to perform a traditional examination.  This may limit your provider's ability to fully assess your condition.  If your provider identifies any concerns that need to be evaluated in person or the need to arrange testing such as labs, EKG, etc, we will make arrangements to do so.    Although advances in technology are sophisticated, we cannot ensure that it will always work on either your end or our end.  If the connection with a video visit is poor, we may have to switch to a telephone visit.  With either a video or telephone visit, we are not always able to ensure that we have a secure connection.   I need to obtain your verbal consent now.   Are you willing to proceed with your visit today?   Jimmy Green has provided verbal consent on 09/06/2020 for a virtual visit (video or telephone).   Cammie Sickle, FNP 09/06/2020  12:22 PM   Virtual Visit via Telephone Note  I connected with Jimmy Green on 09/06/20 at 12:15 PM EST by telephone and verified that I am speaking with the correct person using two identifiers.  Location: Patient: Home Provider: [facility]   I discussed the limitations, risks, security and privacy concerns of performing an evaluation and management service by telephone and the availability of in person appointments. I also discussed with the patient that there may be a patient  responsible charge related to this service. The patient expressed understanding and agreed to proceed.   History of Present Illness:   Jimmy Green is a 70 year old male that presents via telephone with complaints of diarrhea 1 week post COVID 19 infection. Patient was diagnosed with COVID 19 on 08/29/2020. He says that since that time diarrhea has remained and he charterizes it as watery and had decreased to 2-3 stools per day. He has not attempted any OTC interventions to assist with this problem. He also endorses a poor appetite. Of note, patient is fully vaccinated against COVID 19 including booster.   Diarrhea  The current episode started in the past 7 days. The problem occurs 2 to 4 times per day. The problem has been gradually improving. The stool consistency is described as watery. The patient states that diarrhea does not awaken him from sleep. Pertinent negatives include no abdominal pain, arthralgias, bloating, chills, coughing, fever, headaches, increased  flatus, myalgias, sweats, URI, vomiting or weight loss. Nothing aggravates the symptoms. He has tried nothing for the symptoms.   Past Medical History:  Diagnosis Date  . Anginal pain ([place])   . Basal cell carcinoma 02/21/2018   left posterior shoulder at lat edge of scar  . Coronary artery disease   . Depression   . Diabetes mellitus without complication ([place])   . GERD (gastroesophageal reflux disease)   . Hyperlipidemia   .  Hypertension   . Hypogonadism in male   . Metabolic syndrome   . Myocardial infarction ([place])   . Orthopnea   . Seborrheic keratosis   . Skin cancer 2014   nose and right knee  . Sleep apnea    Social History   Socioeconomic History  . Marital status: Married    Spouse name: Thayer Headings  . Number of children: 1  . Years of education: Not on file  . Highest education level: High school graduate  Occupational History  . Occupation: retired  Tobacco Use  . Smoking status: Former Smoker    Years:  40.00    Types: Cigarettes    Quit date: 08/03/2012    Years since quitting: 8.0  . Smokeless tobacco: Never Used  Vaping Use  . Vaping Use: Never used  Substance and Sexual Activity  . Alcohol use: No    Alcohol/week: 0.0 standard drinks  . Drug use: No  . Sexual activity: Yes    Partners: Female    Birth control/protection: None  Other Topics Concern  . Not on file  Social History Narrative   Wife has 2 children by another marriage and he has 1 child by a previous marriage.   Social Determinants of Health   Financial Resource Strain: Not on file  Food Insecurity: Not on file  Transportation Needs: Not on file  Physical Activity: Not on file  Stress: Not on file  Social Connections: Not on file  Intimate Partner Violence: Not on file   Allergies  Allergen Reactions  . Elavil [Amitriptyline Hcl] Rash  . Tape Rash    Paper Tape   Immunization History  Administered Date(s) Administered  . Fluad Quad(high Dose 65+) 05/09/2019  . Influenza, High Dose Seasonal PF 05/11/2017, 05/20/2018  . Influenza,inj,Quad PF,6+ Mos 03/28/2015, 05/11/2016, 05/09/2020  . Influenza-Unspecified 04/03/2014  . PFIZER(Purple Top)SARS-COV-2 Vaccination 08/25/2019, 09/14/2019, 05/20/2020  . Pneumococcal Conjugate-13 04/03/2014  . Pneumococcal Polysaccharide-23 04/07/2017  . Tdap 04/03/2014  . Zoster 06/18/2014  . Zoster Recombinat (Shingrix) 12/16/2017      Assessment and Plan:  1. COVID-19 Patient was diagnosed with COVID-19 on 08/29/2020.  Initially patient has symptoms of sore throat, runny nose, chest congestion, however all symptoms have dissipated with the exception of diarrhea.  Patient is within window of infectivity, advised patient to continue to quarantine per CDC guidelines.  2. Diarrhea, unspecified type No medications warranted at this time.  Patient is currently having 2-3 stools per day, which has improved from previous.  Patient advised to continue replete electrolytes with  Gatorade.  Also, recommend bland diet and advance as tolerated.  Follow Up Instructions:    I discussed the assessment and treatment plan with the patient. The patient was provided an opportunity to ask questions and all were answered. The patient agreed with the plan and demonstrated an understanding of the instructions.   The patient was advised to call back or seek an in-person evaluation if the symptoms worsen or if the condition fails to improve as anticipated.  I provided 6 minutes of non-face-to-face time during this encounter.   Donia Pounds  APRN, MSN, FNP-C [facility] [address] [place], [place] [address] [phone]

## 2020-09-12 ENCOUNTER — Ambulatory Visit (INDEPENDENT_AMBULATORY_CARE_PROVIDER_SITE_OTHER): Payer: Medicare Other

## 2020-09-12 DIAGNOSIS — Z0000 Encounter for general adult medical examination without abnormal findings: Secondary | ICD-10-CM

## 2020-09-12 NOTE — Progress Notes (Signed)
Subjective:   Jimmy Green is a 71 y.o. male who presents for Medicare Annual/Subsequent preventive examination.  Virtual Visit via Telephone Note  I connected with  Jimmy Green on 09/12/20 at  8:00 AM EST by telephone and verified that I am speaking with the correct person using two identifiers.  Location: Patient: home Provider: [place] Persons participating in the virtual visit: [place]   I discussed the limitations, risks, security and privacy concerns of performing an evaluation and management service by telephone and the availability of in person appointments. The patient expressed understanding and agreed to proceed.  Interactive audio and video telecommunications were attempted between this nurse and patient, however failed, due to patient having technical difficulties OR patient did not have access to video capability.  We continued and completed visit with audio only.  Some vital signs may be absent or patient reported.   Jimmy Marker, LPN    Review of Systems     Cardiac Risk Factors include: advanced age (>65mn, >>26women);diabetes mellitus;dyslipidemia;male gender;hypertension;obesity (BMI >30kg/m2)     Objective:    There were no vitals filed for this visit. There is no height or weight on file to calculate BMI.  Advanced Directives 09/12/2020 08/29/2020 06/23/2019 06/16/2018 05/21/2017 05/11/2017 04/06/2017  Does Patient Have a Medical Advance Directive? No No No No No No No  Does patient want to make changes to medical advance directive? - - No - Patient declined - - - -  Would patient like information on creating a medical advance directive? No - Patient declined - - Yes (MAU/Ambulatory/Procedural Areas - Information given) - - No - Patient declined  Pre-existing out of facility DNR order (yellow form or pink MOST form) - - - - - - -    Current Medications (verified) Outpatient Encounter Medications as of 09/12/2020  Medication Sig   . Accu-Chek FastClix Lancets MISC USE AS INSTRUCTED ONCE DAILY  . ACCU-CHEK GUIDE test strip USE AS INSTRUCTED ONCE DAILY  . amLODipine (NORVASC) 2.5 MG tablet Take 1 tablet (2.5 mg total) by mouth daily.  .Marland Kitchenaspirin EC 81 MG tablet Take 81 mg by mouth every morning.   .Marland KitchenbuPROPion (WELLBUTRIN XL) 300 MG 24 hr tablet Take 1 tablet (300 mg total) by mouth daily. New dose, instead of 3 of 150 mg  . escitalopram (LEXAPRO) 20 MG tablet Take 1 tablet (20 mg total) by mouth daily.  .Marland Kitchengabapentin (NEURONTIN) 300 MG capsule TAKE 1 CAPSULE (300 MG TOTAL) BY MOUTH AT BEDTIME.  . metFORMIN (GLUCOPHAGE-XR) 750 MG 24 hr tablet Take 2 tablets (1,500 mg total) by mouth daily with breakfast.  . metoprolol succinate (TOPROL-XL) 50 MG 24 hr tablet Take 1 tablet (50 mg total) by mouth every evening. Take with or immediately following a meal.  . nitroGLYCERIN (NITROSTAT) 0.4 MG SL tablet DISSOLVE 1 TABLET UNDER THE TONGUE EVERY 5 MINUTES FOR UP TO 3 DOSES AS NEEDED FOR CHEST PAIN. IF NO RELIEF AFTER 3 DOSES, CALL 911 OR GO TO  . olmesartan-hydrochlorothiazide (BENICAR HCT) 40-12.5 MG tablet Take 1 tablet by mouth daily. In place of losartan hctz for bp  . omeprazole (PRILOSEC) 20 MG capsule TAKE 1 CAPSULE BY MOUTH 2 TIMES DAILY BEFORE A MEAL.  .Marland KitchenoxyCODONE (ROXICODONE) 15 MG immediate release tablet Take 1 tablet by mouth 5 (five) times daily as needed.  .Marland KitchenPREVIDENT 5000 SENSITIVE 1.1-5 % PSTE Apply 1 each topically 2 (two) times daily.  . rosuvastatin (CRESTOR) 40 MG tablet  Take 1 tablet (40 mg total) by mouth daily.  [name] NARCAN 4 MG/0.1ML LIQD nasal spray kit  (Patient not taking: No sig reported)  . [DISCONTINUED] diclofenac sodium (VOLTAREN) 1 % GEL Apply topically. (Patient not taking: No sig reported)  . [DISCONTINUED] IBU 800 MG tablet Take 800 mg by mouth every 6 (six) hours as needed.  . [DISCONTINUED] Lancets MISC See admin instructions.  . [DISCONTINUED] OVER THE COUNTER MEDICATION hylands restless legs  (Patient not taking: No sig reported)   No facility-administered encounter medications on file as of 09/12/2020.    Allergies (verified) Elavil [amitriptyline hcl] and Tape   History: Past Medical History:  Diagnosis Date  . Anginal pain ([place])   . Basal cell carcinoma 02/21/2018   left posterior shoulder at lat edge of scar  . Coronary artery disease   . Depression   . Diabetes mellitus without complication ([place])   . GERD (gastroesophageal reflux disease)   . Hyperlipidemia   . Hypertension   . Hypogonadism in male   . Metabolic syndrome   . Myocardial infarction ([place])   . Orthopnea   . Seborrheic keratosis   . Skin cancer 2014   nose and right knee  . Sleep apnea    Past Surgical History:  Procedure Laterality Date  . BACK SURGERY    . BASAL CELL CARCINOMA EXCISION Left 12/02/2015   Chest-Done by Dermatologist   . BASAL CELL CARCINOMA EXCISION Left 02/21/2018   Nodulocytic pattern, deep margin involved - [facility] - Dr. Brendolyn Patty  . BASAL CELL CARCINOMA EXCISION  02/23/2018  . CARDIAC CATHETERIZATION Left 04/01/2016   Procedure: Left Heart Cath and Coronary Angiography;  Surgeon: Teodoro Spray, MD;  Location: [place] CV LAB;  Service: Cardiovascular;  Laterality: Left;  . CARDIAC CATHETERIZATION N/A 04/01/2016   Procedure: Intravascular Pressure Wire/FFR Study;  Surgeon: Isaias Cowman, MD;  Location: [name] CV LAB;  Service: Cardiovascular;  Laterality: N/A;  . COLONOSCOPY N/A 01/15/2015   Wohl-ileitis, 2 benign polyps, cryptitis, sigmoid diverticulosis, focal ulceration ICV  . COLONOSCOPY WITH PROPOFOL N/A 08/29/2020   Procedure: COLONOSCOPY WITH PROPOFOL;  Surgeon: Lucilla Lame, MD;  Location: [facility] ENDOSCOPY;  Service: Endoscopy;  Laterality: N/A;  . CORONARY STENT PLACEMENT    . FRACTIONAL FLOW RESERVE WIRE  10/08/2011   Procedure: FRACTIONAL FLOW RESERVE WIRE;  Surgeon: Clent Demark, MD;  Location: [facility] CATH LAB;  Service:  Cardiovascular;;  . HERNIA REPAIR    . KNEE SURGERY    . LEFT HEART CATHETERIZATION WITH CORONARY ANGIOGRAM N/A 10/08/2011   Procedure: LEFT HEART CATHETERIZATION WITH CORONARY ANGIOGRAM;  Surgeon: Clent Demark, MD;  Location: [place] CATH LAB;  Service: Cardiovascular;  Laterality: N/A;  . parodectomy Left 05/07/2020  . SKIN CANCER EXCISION  01/2018   [facility]  . TOTAL HIP ARTHROPLASTY Left 04/06/2017   Procedure: TOTAL HIP ARTHROPLASTY ANTERIOR APPROACH;  Surgeon: Hessie Knows, MD;  Location: [facility] ORS;  Service: Orthopedics;  Laterality: Left;   Family History  Problem Relation Age of Onset  . Lung cancer Father   . Heart disease Father   . Skin cancer Father    Social History   Socioeconomic History  . Marital status: Married    Spouse name: Thayer Headings  . Number of children: 1  . Years of education: Not on file  . Highest education level: High school graduate  Occupational History  . Occupation: retired  Tobacco Use  . Smoking status: Former Smoker    Years:  40.00    Types: Cigarettes    Quit date: 08/03/2012    Years since quitting: 8.1  . Smokeless tobacco: Never Used  Vaping Use  . Vaping Use: Never used  Substance and Sexual Activity  . Alcohol use: No    Alcohol/week: 0.0 standard drinks  . Drug use: No  . Sexual activity: Yes    Partners: Female    Birth control/protection: None  Other Topics Concern  . Not on file  Social History Narrative   Wife has 2 children by another marriage and he has 1 child by a previous marriage.   Social Determinants of Health   Financial Resource Strain: Low Risk   . Difficulty of Paying Living Expenses: Not hard at all  Food Insecurity: No Food Insecurity  . Worried About Charity fundraiser in the Last Year: Never true  . Ran Out of Food in the Last Year: Never true  Transportation Needs: No Transportation Needs  . Lack of Transportation (Medical): No  . Lack of Transportation (Non-Medical): No  Physical Activity:  Inactive  . Days of Exercise per Week: 0 days  . Minutes of Exercise per Session: 0 min  Stress: No Stress Concern Present  . Feeling of Stress : Not at all  Social Connections: Moderately Isolated  . Frequency of Communication with Friends and Family: More than three times a week  . Frequency of Social Gatherings with Friends and Family: Three times a week  . Attends Religious Services: Never  . Active Member of Clubs or Organizations: No  . Attends Archivist Meetings: Never  . Marital Status: Married    Tobacco Counseling Counseling given: Not Answered   Clinical Intake:  Pre-visit preparation completed: Yes  Pain : No/denies pain     Nutritional Status: BMI > 30  Obese Nutritional Risks: Nausea/ vomitting/ diarrhea (due to having Covid) Diabetes: Yes CBG done?: No Did pt. bring in CBG monitor from home?: No  How often do you need to have someone help you when you read instructions, pamphlets, or other written materials from your doctor or pharmacy?: 1 - Never  Nutrition Risk Assessment:  Has the patient had any N/V/D within the last 2 months?  Yes  Does the patient have any non-healing wounds?  No  Has the patient had any unintentional weight loss or weight gain?  No   Diabetes:  Is the patient diabetic?  Yes  If diabetic, was a CBG obtained today?  No  Did the patient bring in their glucometer from home?  No  How often do you monitor your CBG's? Every other day per patient.   Financial Strains and Diabetes Management:  Are you having any financial strains with the device, your supplies or your medication? No .  Does the patient want to be seen by Chronic Care Management for management of their diabetes?  No  Would the patient like to be referred to a Nutritionist or for Diabetic Management?  No   Diabetic Exams:  Diabetic Eye Exam: Overdue for diabetic eye exam. Pt has been advised about the importance in completing this exam.   Diabetic Foot  Exam: Completed 07/15/20.   Interpreter Needed?: No  Information entered by :: Jimmy Marker LPN   Activities of Daily Living In your present state of health, do you have any difficulty performing the following activities: 09/12/2020 07/15/2020  Hearing? Y Y  Comment declines hearing aids -  Vision? N N  Difficulty concentrating or making decisions?  N N  Walking or climbing stairs? Y Y  Dressing or bathing? N N  Doing errands, shopping? N N  Preparing Food and eating ? N -  Using the Toilet? N -  In the past six months, have you accidently leaked urine? N -  Do you have problems with loss of bowel control? N -  Managing your Medications? N -  Managing your Finances? N -  Housekeeping or managing your Housekeeping? N -  Some recent data might be hidden    Patient Care Team: Steele Sizer, MD as PCP - General (Family Medicine) Steele Sizer, MD as Attending Physician (Family Medicine) Teodoro Spray, MD as Consulting Physician (Cardiology) Mohammed Kindle, MD as Referring Physician (Pain Medicine) Brendolyn Patty, MD (Dermatology) Lennox Grumbles, MD as Referring Physician (Otolaryngology) Lucilla Lame, MD as Consulting Physician (Gastroenterology)  Indicate any recent Medical Services you may have received from other than [facility] providers in the past year (date may be approximate).     Assessment:   This is a routine wellness examination for [place].  Hearing/Vision screen  Hearing Screening   '125Hz'  '250Hz'  '500Hz'  '1000Hz'  '2000Hz'  '3000Hz'  '4000Hz'  '6000Hz'  '8000Hz'   Right ear:           Left ear:           Comments: Pt c/o mild hearing difficulty, declines hearing aids  Vision Screening Comments: Past [facility]  Dietary issues and exercise activities discussed: Current Exercise Habits: The patient does not participate in regular exercise at present, Exercise limited by: orthopedic condition(s)  Goals    . Increase physical activity      Recommend increase moderate physical activity to 30 minutes per day 3 days per week. Pt states he plans to join silver sneakers program at [facility] 2/9 Scores 09/12/2020 07/15/2020 03/14/2020 03/06/2020 01/12/2020 11/13/2019 07/14/2019  PHQ - 2 Score 0 0 0 0 0 0 0  PHQ- 9 Score - - 0 0 0 0 2    Fall Risk Fall Risk  09/12/2020 07/15/2020 03/14/2020 03/06/2020 01/12/2020  Falls in the past year? 0 0 0 0 0  Number falls in past yr: 0 0 0 0 0  Injury with Fall? 0 0 0 0 -  Risk for fall due to : No Fall Risks - - - -  Follow up Falls prevention discussed - - - -    FALL RISK PREVENTION PERTAINING TO THE HOME:  Any stairs in or around the home? Yes  If so, are there any without handrails? No  Home free of loose throw rugs in walkways, pet beds, electrical cords, etc? Yes  Adequate lighting in your home to reduce risk of falls? Yes   ASSISTIVE DEVICES UTILIZED TO PREVENT FALLS:  Life alert? No  Use of a cane, walker or w/c? No  Grab bars in the bathroom? No  Shower chair or bench in shower? Yes  Elevated toilet seat or a handicapped toilet? Yes   TIMED UP AND GO:  Was the test performed? No . Telephonic visit.   Cognitive Function: pt declines 6CIT for 2022 AWV        Immunizations Immunization History  Administered Date(s) Administered  . Fluad Quad(high Dose 65+) 05/09/2019  . Influenza, High Dose Seasonal PF 05/11/2017, 05/20/2018  . Influenza,inj,Quad PF,6+ Mos 03/28/2015, 05/11/2016, 05/09/2020  . Influenza-Unspecified 04/03/2014  . PFIZER(Purple Top)SARS-COV-2 Vaccination 08/25/2019, 09/14/2019, 05/20/2020  . Pneumococcal Conjugate-13 04/03/2014  .  Pneumococcal Polysaccharide-23 04/07/2017  . Tdap 04/03/2014  . Zoster 06/18/2014  . Zoster Recombinat (Shingrix) 12/16/2017    TDAP status: Up to date  Flu Vaccine status: Up to date  Pneumococcal vaccine status: Up to date  Covid-19 vaccine status: Completed vaccines  Qualifies for Shingles  Vaccine? Yes   Zostavax completed Yes   Shingrix Completed?: Yes  Screening Tests Health Maintenance  Topic Date Due  . OPHTHALMOLOGY EXAM  03/22/2018  . COVID-19 Vaccine (4 - Booster for Pfizer series) 11/18/2020  . HEMOGLOBIN A1C  01/13/2021  . FOOT EXAM  07/15/2021  . TETANUS/TDAP  04/03/2024  . COLONOSCOPY (Pts 45-28yr Insurance coverage will need to be confirmed)  08/30/2027  . INFLUENZA VACCINE  Completed  . Hepatitis C Screening  Completed  . PNA vac Low Risk Adult  Completed    Health Maintenance  Health Maintenance Due  Topic Date Due  . OPHTHALMOLOGY EXAM  03/22/2018    Colorectal cancer screening: Type of screening: Colonoscopy. Completed 08/29/20. Repeat every 7 years  Lung Cancer Screening: (Low Dose CT Chest recommended if Age 69-80years, 30 pack-year currently smoking OR have quit w/in 15years.) does not qualify.   Additional Screening:  Hepatitis C Screening: does qualify; Completed 06/07/15  Vision Screening: Recommended annual ophthalmology exams for early detection of glaucoma and other disorders of the eye. Is the patient up to date with their annual eye exam?  No  Who is the provider or what is the name of the office in which the patient attends annual eye exams? [facility]: Recommended annual dental exams for proper oral hygiene  Community Resource Referral / Chronic Care Management: CRR required this visit?  No   CCM required this visit?  No  - qualifies but declined     Plan:     I have personally reviewed and noted the following in the patient's chart:   . Medical and social history . Use of alcohol, tobacco or illicit drugs  . Current medications and supplements . Functional ability and status . Nutritional status . Physical activity . Advanced directives . List of other physicians . Hospitalizations, surgeries, and ER visits in previous 12 months . Vitals . Screenings to include cognitive, depression,  and falls . Referrals and appointments  In addition, I have reviewed and discussed with patient certain preventive protocols, quality metrics, and best practice recommendations. A written personalized care plan for preventive services as well as general preventive health recommendations were provided to patient.     KClemetine Marker LPN   29/47/0761  Nurse Notes: pt states he began having covid symptoms following his colonoscopy on 08/29/20 and had a positive home test. Pt experienced GI symptoms and was advised to drink gatorade by FNP with [place] [place] 09[place]20. Pt states he is feeling much better now and denies fever, SOB and GI sxs have resolved.

## 2020-09-12 NOTE — Patient Instructions (Addendum)
Jimmy Green , Thank you for taking time to come for your Medicare Wellness Visit. I appreciate your ongoing commitment to your health goals. Please review the following plan we discussed and let me know if I can assist you in the future.   Screening recommendations/referrals: Colonoscopy: done 08/29/20. Repeat in 2029 Recommended yearly ophthalmology/optometry visit for glaucoma screening and checkup Recommended yearly dental visit for hygiene and checkup  Vaccinations: Influenza vaccine: done 05/09/20 Pneumococcal vaccine: done 04/07/17 Tdap vaccine: done 04/03/14 Shingles vaccine: done 12/16/17   Covid-19: done 08/25/19, 09/14/19 & 05/20/20  Advanced directives: Please bring a copy of your health care power of attorney and living will to the office at your convenience once you have completed that paperwork.   Conditions/risks identified: Recommend healthy eating and physical activity to lower A1c. For additional resources to manage your diabetes please visit:  Www.diabetes.org  [email or web address]  Next appointment: Follow up in one year for your annual wellness visit.   Preventive Care 66 Years and Older, Male Preventive care refers to lifestyle choices and visits with your health care provider that can promote health and wellness. What does preventive care include?  A yearly physical exam. This is also called an annual well check.  Dental exams once or twice a year.  Routine eye exams. Ask your health care provider how often you should have your eyes checked.  Personal lifestyle choices, including:  Daily care of your teeth and gums.  Regular physical activity.  Eating a healthy diet.  Avoiding tobacco and drug use.  Limiting alcohol use.  Practicing safe sex.  Taking low doses of aspirin every day.  Taking vitamin and mineral supplements as recommended by your health care provider. What happens during an annual well check? The services and screenings done by  your health care provider during your annual well check will depend on your age, overall health, lifestyle risk factors, and family history of disease. Counseling  Your health care provider may ask you questions about your:  Alcohol use.  Tobacco use.  Drug use.  Emotional well-being.  Home and relationship well-being.  Sexual activity.  Eating habits.  History of falls.  Memory and ability to understand (cognition).  Work and work Statistician. Screening  You may have the following tests or measurements:  Height, weight, and BMI.  Blood pressure.  Lipid and cholesterol levels. These may be checked every 5 years, or more frequently if you are over 61 years old.  Skin check.  Lung cancer screening. You may have this screening every year starting at age 35 if you have a 30-pack-year history of smoking and currently smoke or have quit within the past 15 years.  Fecal occult blood test (FOBT) of the stool. You may have this test every year starting at age 30.  Flexible sigmoidoscopy or colonoscopy. You may have a sigmoidoscopy every 5 years or a colonoscopy every 10 years starting at age 25.  Prostate cancer screening. Recommendations will vary depending on your family history and other risks.  Hepatitis C blood test.  Hepatitis B blood test.  Sexually transmitted disease (STD) testing.  Diabetes screening. This is done by checking your blood sugar (glucose) after you have not eaten for a while (fasting). You may have this done every 1-3 years.  Abdominal aortic aneurysm (AAA) screening. You may need this if you are a current or former smoker.  Osteoporosis. You may be screened starting at age 69 if you are at high risk. Talk with your health care  provider about your test results, treatment options, and if necessary, the need for more tests. Vaccines  Your health care provider may recommend certain vaccines, such as:  Influenza vaccine. This is recommended every  year.  Tetanus, diphtheria, and acellular pertussis (Tdap, Td) vaccine. You may need a Td booster every 10 years.  Zoster vaccine. You may need this after age 77.  Pneumococcal 13-valent conjugate (PCV13) vaccine. One dose is recommended after age 71.  Pneumococcal polysaccharide (PPSV23) vaccine. One dose is recommended after age 68. Talk to your health care provider about which screenings and vaccines you need and how often you need them. This information is not intended to replace advice given to you by your health care provider. Make sure you discuss any questions you have with your health care provider. Document Released: 08/16/2015 Document Revised: 04/08/2016 Document Reviewed: 05/21/2015 Elsevier Interactive Patient Education  2017 [place] Prevention in the Home Falls can cause injuries. They can happen to people of all ages. There are many things you can do to make your home safe and to help prevent falls. What can I do on the outside of my home?  Regularly fix the edges of walkways and driveways and fix any cracks.  Remove anything that might make you trip as you walk through a door, such as a raised step or threshold.  Trim any bushes or trees on the path to your home.  Use bright outdoor lighting.  Clear any walking paths of anything that might make someone trip, such as rocks or tools.  Regularly check to see if handrails are loose or broken. Make sure that both sides of any steps have handrails.  Any raised decks and porches should have guardrails on the edges.  Have any leaves, snow, or ice cleared regularly.  Use sand or salt on walking paths during winter.  Clean up any spills in your garage right away. This includes oil or grease spills. What can I do in the bathroom?  Use night lights.  Install grab bars by the toilet and in the tub and shower. Do not use towel bars as grab bars.  Use non-skid mats or decals in the tub or shower.  If you  need to sit down in the shower, use a plastic, non-slip stool.  Keep the floor dry. Clean up any water that spills on the floor as soon as it happens.  Remove soap buildup in the tub or shower regularly.  Attach bath mats securely with double-sided non-slip rug tape.  Do not have throw rugs and other things on the floor that can make you trip. What can I do in the bedroom?  Use night lights.  Make sure that you have a light by your bed that is easy to reach.  Do not use any sheets or blankets that are too big for your bed. They should not hang down onto the floor.  Have a firm chair that has side arms. You can use this for support while you get dressed.  Do not have throw rugs and other things on the floor that can make you trip. What can I do in the kitchen?  Clean up any spills right away.  Avoid walking on wet floors.  Keep items that you use a lot in easy-to-reach places.  If you need to reach something above you, use a strong step stool that has a grab bar.  Keep electrical cords out of the way.  Do not use floor polish  or wax that makes floors slippery. If you must use wax, use non-skid floor wax.  Do not have throw rugs and other things on the floor that can make you trip. What can I do with my stairs?  Do not leave any items on the stairs.  Make sure that there are handrails on both sides of the stairs and use them. Fix handrails that are broken or loose. Make sure that handrails are as long as the stairways.  Check any carpeting to make sure that it is firmly attached to the stairs. Fix any carpet that is loose or worn.  Avoid having throw rugs at the top or bottom of the stairs. If you do have throw rugs, attach them to the floor with carpet tape.  Make sure that you have a light switch at the top of the stairs and the bottom of the stairs. If you do not have them, ask someone to add them for you. What else can I do to help prevent falls?  Wear shoes  that:  Do not have high heels.  Have rubber bottoms.  Are comfortable and fit you well.  Are closed at the toe. Do not wear sandals.  If you use a stepladder:  Make sure that it is fully opened. Do not climb a closed stepladder.  Make sure that both sides of the stepladder are locked into place.  Ask someone to hold it for you, if possible.  Clearly mark and make sure that you can see:  Any grab bars or handrails.  First and last steps.  Where the edge of each step is.  Use tools that help you move around (mobility aids) if they are needed. These include:  Canes.  Walkers.  Scooters.  Crutches.  Turn on the lights when you go into a dark area. Replace any light bulbs as soon as they burn out.  Set up your furniture so you have a clear path. Avoid moving your furniture around.  If any of your floors are uneven, fix them.  If there are any pets around you, be aware of where they are.  Review your medicines with your doctor. Some medicines can make you feel dizzy. This can increase your chance of falling. Ask your doctor what other things that you can do to help prevent falls. This information is not intended to replace advice given to you by your health care provider. Make sure you discuss any questions you have with your health care provider. Document Released: 05/16/2009 Document Revised: 12/26/2015 Document Reviewed: 08/24/2014 Elsevier Interactive Patient Education  2017 [employer].

## 2020-09-14 ENCOUNTER — Other Ambulatory Visit: Payer: Self-pay | Admitting: Family Medicine

## 2020-09-14 DIAGNOSIS — I152 Hypertension secondary to endocrine disorders: Secondary | ICD-10-CM

## 2020-09-14 DIAGNOSIS — I10 Essential (primary) hypertension: Secondary | ICD-10-CM

## 2020-09-24 ENCOUNTER — Ambulatory Visit: Payer: Medicare Other

## 2020-09-24 ENCOUNTER — Ambulatory Visit: Payer: Medicare Other | Admitting: Dermatology

## 2020-09-26 DIAGNOSIS — G4733 Obstructive sleep apnea (adult) (pediatric): Secondary | ICD-10-CM | POA: Diagnosis not present

## 2020-09-30 ENCOUNTER — Ambulatory Visit (INDEPENDENT_AMBULATORY_CARE_PROVIDER_SITE_OTHER): Payer: Medicare Other

## 2020-09-30 ENCOUNTER — Other Ambulatory Visit: Payer: Self-pay

## 2020-09-30 DIAGNOSIS — E538 Deficiency of other specified B group vitamins: Secondary | ICD-10-CM

## 2020-09-30 MED ORDER — CYANOCOBALAMIN 1000 MCG/ML IJ SOLN
1000.0000 ug | Freq: Once | INTRAMUSCULAR | Status: AC
  Administered 2020-09-30: 1000 ug via INTRAMUSCULAR

## 2020-10-01 ENCOUNTER — Ambulatory Visit: Payer: Medicare Other | Admitting: Dermatology

## 2020-10-01 DIAGNOSIS — G894 Chronic pain syndrome: Secondary | ICD-10-CM | POA: Diagnosis not present

## 2020-10-10 ENCOUNTER — Encounter: Payer: Medicare Other | Admitting: Family Medicine

## 2020-10-24 DIAGNOSIS — G4733 Obstructive sleep apnea (adult) (pediatric): Secondary | ICD-10-CM | POA: Diagnosis not present

## 2020-10-28 ENCOUNTER — Other Ambulatory Visit: Payer: Self-pay

## 2020-10-28 ENCOUNTER — Ambulatory Visit (INDEPENDENT_AMBULATORY_CARE_PROVIDER_SITE_OTHER): Payer: Medicare Other

## 2020-10-28 ENCOUNTER — Ambulatory Visit: Payer: Self-pay | Admitting: *Deleted

## 2020-10-28 DIAGNOSIS — E538 Deficiency of other specified B group vitamins: Secondary | ICD-10-CM | POA: Diagnosis not present

## 2020-10-28 MED ORDER — CYANOCOBALAMIN 1000 MCG/ML IJ SOLN
1000.0000 ug | Freq: Once | INTRAMUSCULAR | Status: AC
Start: 2020-10-28 — End: 2020-10-28
  Administered 2020-10-28: 1000 ug via INTRAMUSCULAR

## 2020-10-28 NOTE — Telephone Encounter (Addendum)
Pt called in c/o left shoulder aching after getting his B12 shot this morning about 9:00.   "It started aching by the time I got to the car".   "I've never had a B12 shot hurt".   "I don't usually know I've had one".  Wife initially called but put Ronnie on the phone so I could ask him the triage questions.  "It was a new nurse that gave the shot this morning and she used a bigger needle than usual".    He denies numbness or tingling going down his left arm into his fingers.   He can move the shoulder normally but it just "really aches".    I let him know someone would be getting back in contact with him.   I let him know if he needed to he could take some Tylenol to help with the pain.  He was agreeable to being called back.  I sent my notes to [facility] high priority I also called into the office and made them aware of the situation.  Reason for Disposition . Immunization needed, questions about    Got B12 shot this morning and now left shoulder is aching real bad.  Answer Assessment - Initial Assessment Questions 1. SYMPTOMS: "What is the main symptom?" (e.g., redness, swelling, pain)      Wife called in.  Husband had B12 shot this morning.   It was a new nurse that gave the shot.  She used a longer needle than usual.  Given at 9:00 this morning. 2. ONSET: "When was the vaccine (shot) given?" "How much later did the B12 shot begin?" (e.g., hours, days ago)      It's aching in left shoulder.   No numbness or tingling.    3. SEVERITY: "How bad is it?"      I've never had it hurt like 4. FEVER: "Is there a fever?" If Yes, ask: "What is it, how was it measured, and when did it start?"      No 5. IMMUNIZATIONS GIVEN: "What shots have you recently received?"     B12 6. PAST REACTIONS: "Have you reacted to immunizations before?" If Yes, ask: "What happened?"     No 7. OTHER SYMPTOMS: "Do you have any other symptoms?"     No swelling  Protocols used: IMMUNIZATION  REACTIONS-A-AH

## 2020-10-28 NOTE — Telephone Encounter (Signed)
Patient called notified him that student actually gave the shot, and it could of been her technique.  Patient was told to take tylenol/ibuprofen for pain/swelling, ice for sorness and exercise/ move arm.  Let us know if any other problems or does not get better and from now on make sure nurse gives injection.

## 2020-10-29 ENCOUNTER — Ambulatory Visit: Payer: Self-pay

## 2020-10-29 DIAGNOSIS — G894 Chronic pain syndrome: Secondary | ICD-10-CM | POA: Diagnosis not present

## 2020-10-29 NOTE — Telephone Encounter (Signed)
Pt. Reports he has had headaches "for years and Dr. Ancil Boozer knows about them, but they are getting worse." Come and go. Only relief is to lay down with a pillow on his head. Reports a cough can trigger the pain. Hurts in his right eye.Appointment made for tomorrow. Instructed wife to go to ED for worsening of symptoms.  Reason for Disposition . [1] MODERATE headache (e.g., interferes with normal activities) AND [2] present > 24 hours AND [3] unexplained  (Exceptions: analgesics not tried, typical migraine, or headache part of viral illness)  Answer Assessment - Initial Assessment Questions 1. LOCATION: "Where does it hurt?"      Hurts in the right eye 2. ONSET: "When did the headache start?" (Minutes, hours or days)      Years ago 3. PATTERN: "Does the pain come and go, or has it been constant since it started?"     Comes and goes 4. SEVERITY: "How bad is the pain?" and "What does it keep you from doing?"  (e.g., Scale 1-10; mild, moderate, or severe)   - MILD (1-3): doesn't interfere with normal activities    - MODERATE (4-7): interferes with normal activities or awakens from sleep    - SEVERE (8-10): excruciating pain, unable to do any normal activities        10 5. RECURRENT SYMPTOM: "Have you ever had headaches before?" If Yes, ask: "When was the last time?" and "What happened that time?"      Yes 6. CAUSE: "What do you think is causing the headache?"     Unsure 7. MIGRAINE: "Have you been diagnosed with migraine headaches?" If Yes, ask: "Is this headache similar?"      No 8. HEAD INJURY: "Has there been any recent injury to the head?"      No 9. OTHER SYMPTOMS: "Do you have any other symptoms?" (fever, stiff neck, eye pain, sore throat, cold symptoms)     Eye pain 10. PREGNANCY: "Is there any chance you are pregnant?" "When was your last menstrual period?"       n/a  Protocols used: HEADACHE-A-AH

## 2020-10-30 ENCOUNTER — Encounter: Payer: Self-pay | Admitting: Family Medicine

## 2020-10-30 ENCOUNTER — Other Ambulatory Visit: Payer: Self-pay

## 2020-10-30 ENCOUNTER — Ambulatory Visit (INDEPENDENT_AMBULATORY_CARE_PROVIDER_SITE_OTHER): Payer: Medicare Other | Admitting: Family Medicine

## 2020-10-30 VITALS — BP 136/88 | HR 83 | Temp 98.0°F | Resp 16 | Ht 69.0 in | Wt 225.0 lb

## 2020-10-30 DIAGNOSIS — I152 Hypertension secondary to endocrine disorders: Secondary | ICD-10-CM | POA: Diagnosis not present

## 2020-10-30 DIAGNOSIS — E1159 Type 2 diabetes mellitus with other circulatory complications: Secondary | ICD-10-CM | POA: Diagnosis not present

## 2020-10-30 DIAGNOSIS — R519 Headache, unspecified: Secondary | ICD-10-CM | POA: Diagnosis not present

## 2020-10-30 MED ORDER — PREDNISONE 10 MG PO TABS: 10.0000 mg | ORAL_TABLET | Freq: Every day | ORAL | 0 refills | Status: DC

## 2020-10-30 NOTE — Progress Notes (Signed)
Name: Jimmy Green   MRN: [id number]    DOB: 11-19-1950   Date:10/30/2020       Progress Note  Subjective  Chief Complaint  Headache x2 weeks  HPI  Unilateral headache: he states he had similar pain years ago, he states this time around the pain is much stronger. He states pain has been constant for the past 2 weeks, decreases in intensity when he takes Tylenol. He states without medication the pain is intense, feels like his right eye will pop out, he states tender to touch over right eye lid, it can be sharp, dull, stabbing or throbbing. Denies tearing or rhinorrhea. No associated with nausea and vomiting, he states worse when coughing, the pain is so intense that he wakes up with pain during the night and has to apply a pillow to his face or sit up straight to decrease pain. He also has photophobia. No change in his vision. Denies any neuro focal deficit. He has seen Dr. Melrose Nakayama in the past for dizziness but not for headaches.   CT 2017  1. No acute intracranial abnormality. 2. 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  MRI 08/2015   IMPRESSION: Atrophy and chronic microvascular ischemic change. No acute intracranial abnormality.  No cause for dizziness or hearing loss identified.  Patient Active Problem List   Diagnosis Date Noted  . Personal history of colonic polyps   . Polyp of transverse colon   . Parotid mass 04/17/2020  . BPH with obstruction/lower urinary tract symptoms 05/20/2018  . Basal cell carcinoma 03/28/2018  . History of basal cell carcinoma 03/23/2018  . Primary localized osteoarthritis of left hip 04/06/2017  . Dyslipidemia associated with type 2 diabetes mellitus ([place]) 10/04/2016  . Trigger thumb of both thumbs 06/02/2016  . Chronic tension-type headache, intractable 09/25/2015  . Benign paroxysmal positional vertigo due to bilateral vestibular  disorder 09/25/2015  . Hearing loss sensory, bilateral 08/26/2015  . Cerebral microvascular disease 08/26/2015  . Metabolic syndrome 97/09/6376  . Vertigo 06/21/2015  . Buzzing in ear 06/21/2015  . Hypertension goal BP (blood pressure) < 140/90 03/28/2015  . GERD (gastroesophageal reflux disease) 03/28/2015  . Hyperlipidemia 03/28/2015  . Depression, major 03/28/2015  . CAD in native artery 03/28/2015  . Actinic keratosis 02/14/2015  . Obstructive sleep apnea 02/03/2015  . Diverticulosis of colon without hemorrhage 01/31/2015  . Hx of colonic polyps   . Benign neoplasm of descending colon   . Benign neoplasm of sigmoid colon   . Idiopathic colitis   . Sebaceous cyst 09/14/2014    Past Surgical History:  Procedure Laterality Date  . BACK SURGERY    . BASAL CELL CARCINOMA EXCISION Left 12/02/2015   Chest-Done by Dermatologist   . BASAL CELL CARCINOMA EXCISION Left 02/21/2018   Nodulocytic pattern, deep margin involved - [facility] - Dr. Brendolyn Patty  . BASAL CELL CARCINOMA EXCISION  02/23/2018  . CARDIAC CATHETERIZATION Left 04/01/2016   Procedure: Left Heart Cath and Coronary Angiography;  Surgeon: Teodoro Spray, MD;  Location: [place];  Service: Cardiovascular;  Laterality: Left;  . CARDIAC CATHETERIZATION N/A 04/01/2016   Procedure: Intravascular Pressure Wire/FFR Study;  Surgeon: Isaias Cowman, MD;  Location: [place] [facility];  Service: Cardiovascular;  Laterality: N/A;  . COLONOSCOPY N/A 01/15/2015   Wohl-ileitis, 2 benign polyps, cryptitis, sigmoid diverticulosis, focal ulceration ICV  . COLONOSCOPY WITH PROPOFOL  N/A 08/29/2020   Procedure: COLONOSCOPY WITH PROPOFOL;  Surgeon: Lucilla Lame, MD;  Location: [facility] ENDOSCOPY;  Service: Endoscopy;  Laterality: N/A;  . CORONARY STENT PLACEMENT    . FRACTIONAL FLOW RESERVE WIRE  10/08/2011   Procedure: FRACTIONAL FLOW RESERVE WIRE;  Surgeon: Clent Demark, MD;  Location: [facility] CATH LAB;  Service:  Cardiovascular;;  . HERNIA REPAIR    . KNEE SURGERY    . LEFT HEART CATHETERIZATION WITH CORONARY ANGIOGRAM N/A 10/08/2011   Procedure: LEFT HEART CATHETERIZATION WITH CORONARY ANGIOGRAM;  Surgeon: Clent Demark, MD;  Location: [facility] CATH LAB;  Service: Cardiovascular;  Laterality: N/A;  . parodectomy Left 05/07/2020  . SKIN CANCER EXCISION  01/2018   [facility]  . TOTAL HIP ARTHROPLASTY Left 04/06/2017   Procedure: TOTAL HIP ARTHROPLASTY ANTERIOR APPROACH;  Surgeon: Hessie Knows, MD;  Location: [facility] ORS;  Service: Orthopedics;  Laterality: Left;    Family History  Problem Relation Age of Onset  . Lung cancer Father   . Heart disease Father   . Skin cancer Father     Social History   Tobacco Use  . Smoking status: Former Smoker    Years: 40.00    Types: Cigarettes    Quit date: 08/03/2012    Years since quitting: 8.2  . Smokeless tobacco: Never Used  Substance Use Topics  . Alcohol use: No    Alcohol/week: 0.0 standard drinks     Current Outpatient Medications:  .  amLODipine (NORVASC) 2.5 MG tablet, Take 1 tablet (2.5 mg total) by mouth daily., Disp: 90 tablet, Rfl: 1 .  aspirin EC 81 MG tablet, Take 81 mg by mouth every morning. , Disp: , Rfl:  .  buPROPion (WELLBUTRIN XL) 300 MG 24 hr tablet, Take 1 tablet (300 mg total) by mouth daily. New dose, instead of 3 of 150 mg, Disp: 90 tablet, Rfl: 1 .  escitalopram (LEXAPRO) 20 MG tablet, Take 1 tablet (20 mg total) by mouth daily., Disp: 90 tablet, Rfl: 1 .  gabapentin (NEURONTIN) 300 MG capsule, TAKE 1 CAPSULE (300 MG TOTAL) BY MOUTH AT BEDTIME., Disp: 90 capsule, Rfl: 1 .  metFORMIN (GLUCOPHAGE-XR) 750 MG 24 hr tablet, Take 2 tablets (1,500 mg total) by mouth daily with breakfast., Disp: 180 tablet, Rfl: 0 .  metoprolol succinate (TOPROL-XL) 50 MG 24 hr tablet, Take 1 tablet (50 mg total) by mouth every evening. Take with or immediately following a meal., Disp: 90 tablet, Rfl: 1 .  nitroGLYCERIN (NITROSTAT) 0.4 MG SL  tablet, DISSOLVE 1 TABLET UNDER THE TONGUE EVERY 5 MINUTES FOR UP TO 3 DOSES AS NEEDED FOR CHEST PAIN. IF NO RELIEF AFTER 3 DOSES, CALL 911 OR GO TO, Disp: 25 tablet, Rfl: 0 .  olmesartan-hydrochlorothiazide (BENICAR HCT) 40-12.5 MG tablet, TAKE 1 TABLET BY MOUTH DAILY FOR BLOOD PRESSURE. STOP LOSARTAN HCTZ, Disp: 90 tablet, Rfl: 1 .  omeprazole (PRILOSEC) 20 MG capsule, TAKE 1 CAPSULE BY MOUTH 2 TIMES DAILY BEFORE A MEAL., Disp: 180 capsule, Rfl: 1 .  oxyCODONE (ROXICODONE) 15 MG immediate release tablet, Take 1 tablet by mouth 5 (five) times daily as needed., Disp: , Rfl:  .  PREVIDENT 5000 SENSITIVE 1.1-5 % PSTE, Apply 1 each topically 2 (two) times daily., Disp: , Rfl: 5 .  rosuvastatin (CRESTOR) 40 MG tablet, Take 1 tablet (40 mg total) by mouth daily., Disp: 90 tablet, Rfl: 3 .  Accu-Chek FastClix Lancets MISC, USE AS INSTRUCTED ONCE DAILY (Patient not taking: Reported on 10/30/2020), Disp: 102 each,  Rfl: 12 .  ACCU-CHEK GUIDE test strip, USE AS INSTRUCTED ONCE DAILY (Patient not taking: Reported on 10/30/2020), Disp: 100 strip, Rfl: 12 .  NARCAN 4 MG/0.1ML LIQD nasal spray kit, , Disp: , Rfl: 0  Allergies  Allergen Reactions  . Elavil [Amitriptyline Hcl] Rash  . Tape Rash    Paper Tape    I personally reviewed active problem list, medication list, allergies, family history, social history, health maintenance with the patient/caregiver today.   ROS  Constitutional: Negative for fever or weight change.  Respiratory: Negative for cough and shortness of breath.   Cardiovascular: Negative for chest pain or palpitations.  Gastrointestinal: Negative for abdominal pain, no bowel changes.  Musculoskeletal: Negative for gait problem or joint swelling.  Skin: Negative for rash.  Neurological: Negative for dizziness , positive for  headache.  No other specific complaints in a complete review of systems (except as listed in HPI above).  Objective  Vitals:   10/30/20 1122  BP: 136/88   Pulse: 83  Resp: 16  Temp: 98 F (36.7 C)  TempSrc: Oral  SpO2: 96%  Weight: 225 lb (102.1 kg)  Height: '5\' 9"'  (1.753 m)    Body mass index is 33.23 kg/m.  Physical Exam  Constitutional: Patient appears well-developed and well-nourished. Obese No distress.  HEENT: head atraumatic, normocephalic, pupils equal and reactive to light, fundoscopic exam attempted /blinking due to photophobia ,  neck supple Cardiovascular: Normal rate, regular rhythm and normal heart sounds.  No murmur heard. No BLE edema. Pulmonary/Chest: Effort normal and breath sounds normal. No respiratory distress. Abdominal: Soft.  There is no tenderness. Neurological no focal findings, normal tandem walk, normal cranial nerves, strength, sensation, romberg negative Psychiatric: Patient has a normal mood and affect. behavior is normal. Judgment and thought content normal.  Recent Results (from the past 2160 hour(s))  SARS CORONAVIRUS 2 (TAT 6-24 HRS) Nasopharyngeal Nasopharyngeal Swab     Status: None   Collection Time: 08/16/20  8:48 AM   Specimen: Nasopharyngeal Swab  Result Value Ref Range   SARS Coronavirus 2 NEGATIVE NEGATIVE    Comment: (NOTE) SARS-CoV-2 target nucleic acids are NOT DETECTED.  The SARS-CoV-2 RNA is generally detectable in upper and lower respiratory specimens during the acute phase of infection. Negative results do not preclude SARS-CoV-2 infection, do not rule out co-infections with other pathogens, and should not be used as the sole basis for treatment or other patient management decisions. Negative results must be combined with clinical observations, patient history, and epidemiological information. The expected result is Negative.  Fact Sheet for Patients: SugarRoll.be  Fact Sheet for Healthcare Providers: https://www.woods-mathews.com/  This test is not yet approved or cleared by the [place] FDA and  has been authorized for  detection and/or diagnosis of SARS-CoV-2 by FDA under an Emergency Use Authorization (EUA). This EUA will remain  in effect (meaning this test can be used) for the duration of the COVID-19 declaration under Se ction 564(b)(1) of the Act, 21 U.S.C. section 360bbb-3(b)(1), unless the authorization is terminated or revoked sooner.  Performed at [facility], [place] [address] [place], [place] [address]   SARS CORONAVIRUS 2 (TAT 6-24 HRS) Nasopharyngeal Nasopharyngeal Swab     Status: None   Collection Time: 08/27/20  8:24 AM   Specimen: Nasopharyngeal Swab  Result Value Ref Range   SARS Coronavirus 2 NEGATIVE NEGATIVE    Comment: (NOTE) SARS-CoV-2 target nucleic acids are NOT DETECTED.  The SARS-CoV-2 RNA is generally detectable in upper  and lower respiratory specimens during the acute phase of infection. Negative results do not preclude SARS-CoV-2 infection, do not rule out co-infections with other pathogens, and should not be used as the sole basis for treatment or other patient management decisions. Negative results must be combined with clinical observations, patient history, and epidemiological information. The expected result is Negative.  Fact Sheet for Patients: SugarRoll.be  Fact Sheet for Healthcare Providers: https://www.woods-mathews.com/  This test is not yet approved or cleared by the [place] FDA and  has been authorized for detection and/or diagnosis of SARS-CoV-2 by FDA under an Emergency Use Authorization (EUA). This EUA will remain  in effect (meaning this test can be used) for the duration of the COVID-19 declaration under Se ction 564(b)(1) of the Act, 21 U.S.C. section 360bbb-3(b)(1), unless the authorization is terminated or revoked sooner.  Performed at [facility], [place] [address], [place], [place] [address]   Surgical pathology     Status: None   Collection Time: 08/29/20  9:31 AM  Result Value  Ref Range   SURGICAL PATHOLOGY      SURGICAL PATHOLOGY CASE: [id number] PATIENT: [name] Surgical Pathology Report     Specimen Submitted: A. Colon polyp, ascending; cs B. Colon polyp, transverse; cs  Clinical History: History of colon polyps, colon polyps      DIAGNOSIS: A. COLON POLYP, ASCENDING; COLD SNARE: - HYPERPLASTIC POLYP. - NEGATIVE FOR DYSPLASIA AND MALIGNANCY.  B.  COLON POLYP, TRANSVERSE; COLD SNARE: - HYPERPLASTIC POLYP. - NEGATIVE FOR DYSPLASIA AND MALIGNANCY.  GROSS DESCRIPTION: A. Labeled: Cold snare ascending colon polyp Received: Formalin Collection time: 9:31 AM on 08/29/2020 Placed into formalin time: 9:31 AM on 08/29/2020 Tissue fragment(s): 1 Size: 0.6 x 0.4 x 0.2 cm Description: Tan soft tissue fragment Entirely submitted in 1 cassette.  B. Labeled: Cold snare transverse colon polyp Received: Formalin Collection time: 9:34 AM on 08/29/2020 Placed into formalin time: 9:34 AM on 08/29/2020 Tissue fragment(s): Multiple Size: Aggregate, 1.8 x 0.4 x 0.2 c m Description: Received is a single fragment of tan soft tissue admixed with fecal matter.  The ratio of soft to tissue fecal matter is 50: 50. Entirely submitted in 1 cassette.  Final Diagnosis performed by Quay Burow, MD.   Electronically signed 08/30/2020 3:05:38PM The electronic signature indicates that the named Attending Pathologist has evaluated the specimen Technical component performed at [facility], [address], [place], [place] [address] Lab: [phone] Dir: Rush Farmer, MD, MMM  Professional component performed at [facility], [facility], [place], [place], [place] [address] Lab: [phone] Dir: Dellia Nims. Rubinas, MD      PHQ2/9: Depression screen [facility] 2/9 10/30/2020 09/12/2020 07/15/2020 03/14/2020 03/06/2020  Decreased Interest 0 0 0 0 0  Down, Depressed, Hopeless 0 0 0 0 0  PHQ - 2 Score 0 0 0 0 0  Altered sleeping 0 - - 0 0  Tired,  decreased energy 0 - - 0 0  Change in appetite 0 - - 0 0  Feeling bad or failure about yourself  0 - - 0 0  Trouble concentrating 0 - - 0 0  Moving slowly or fidgety/restless 0 - - 0 0  Suicidal thoughts 0 - - 0 0  PHQ-9 Score 0 - - 0 0  Difficult doing work/chores - - - - -  Some recent data might be hidden    phq 9 is negative   Fall Risk: Fall Risk  10/30/2020 09/12/2020 07/15/2020 03/14/2020 03/06/2020  Falls in  the past year? 0 0 0 0 0  Number falls in past yr: 0 0 0 0 0  Injury with Fall? 0 0 0 0 0  Risk for fall due to : - No Fall Risks - - -  Follow up - Falls prevention discussed - - -    Functional Status Survey: Is the patient deaf or have difficulty hearing?: Yes Does the patient have difficulty seeing, even when wearing glasses/contacts?: No Does the patient have difficulty concentrating, remembering, or making decisions?: No Does the patient have difficulty walking or climbing stairs?: Yes Does the patient have difficulty dressing or bathing?: No Does the patient have difficulty doing errands alone such as visiting a doctor's office or shopping?: No    Assessment & Plan   1. Sudden onset unilateral headache  - MR BRAIN W WO CONTRAST; Future - Ambulatory referral to Ophthalmology - predniSONE (DELTASONE) 10 MG tablet; Take 1 tablet (10 mg total) by mouth daily with breakfast.  Dispense: 6 tablet; Refill: 0 Go to EC if worsening of symptoms   2. Hypertension associated with diabetes [facility])  - Ambulatory referral to Ophthalmology

## 2020-11-04 ENCOUNTER — Other Ambulatory Visit: Payer: Self-pay | Admitting: Family Medicine

## 2020-11-04 DIAGNOSIS — I2510 Atherosclerotic heart disease of native coronary artery without angina pectoris: Secondary | ICD-10-CM

## 2020-11-04 NOTE — Telephone Encounter (Signed)
Requested Prescriptions  Pending Prescriptions Disp Refills  . nitroGLYCERIN (NITROSTAT) 0.4 MG SL tablet [Pharmacy Med Name: NITROGLYCERIN 0.4 MG SUBL 0.4 Tablet] 25 tablet 0    Sig: DISSOLVE 1 TABLET UNDER THE TONGUE EVERY 5 MINUTES FOR UP TO 3 DOSES AS NEEDED FOR CHEST PAIN. IF NO RELIEF AFTER 3 DOSES, CALL 911 OR GO TO ER.     Cardiovascular:  Nitrates Passed - 11/04/2020 11:21 AM      Passed - Last BP in normal range    BP Readings from Last 1 Encounters:  10/30/20 136/88         Passed - Last Heart Rate in normal range    Pulse Readings from Last 1 Encounters:  10/30/20 83         Passed - Valid encounter within last 12 months    Recent Outpatient Visits          5 days ago Sudden onset unilateral headache   [facility], Drue Stager, MD   3 months ago Dyslipidemia associated with type 2 diabetes mellitus [facility])   [facility] Steele Sizer, MD   7 months ago Dyslipidemia associated with type 2 diabetes mellitus [facility])   [facility] Steele Sizer, MD   8 months ago Enlarged parotid gland   [facility] Steele Sizer, MD   9 months ago Unstable angina [facility])   [facility] Steele Sizer, MD      Future Appointments            In 1 week Steele Sizer, MD [facility], [place]   In 4 weeks Delsa Grana, PA-C [facility], [place]   In 1 month Brendolyn Patty, MD [place]   In 10 months  [facility], [facility]

## 2020-11-05 ENCOUNTER — Other Ambulatory Visit: Payer: Self-pay | Admitting: Family Medicine

## 2020-11-05 DIAGNOSIS — E1169 Type 2 diabetes mellitus with other specified complication: Secondary | ICD-10-CM

## 2020-11-05 NOTE — Telephone Encounter (Signed)
   Notes to clinic:  Patient has appointment on 11/13/2020 Medication was last filled on 05/13/2020 Review for refill Patient should have ran out in 08/2020  Requested Prescriptions  Pending Prescriptions Disp Refills   metFORMIN (GLUCOPHAGE-XR) 750 MG 24 hr tablet [Pharmacy Med Name: METFORMIN HCL ER 750 MG TAB 750 Tablet] 180 tablet 0    Sig: TAKE 2 TABLETS BY MOUTH DAILY WITH BREAKFAST.      Endocrinology:  Diabetes - Biguanides Failed - 11/05/2020  9:31 AM      Failed - HBA1C is between 0 and 7.9 and within 180 days    Hemoglobin A1C  Date Value Ref Range Status  07/15/2020 8.2 (A) 4.0 - 5.6 % Final   HbA1c, POC (controlled diabetic range)  Date Value Ref Range Status  11/13/2019 7.2 (A) 0.0 - 7.0 % Final          Passed - Cr in normal range and within 360 days    Creat  Date Value Ref Range Status  03/14/2020 0.70 0.70 - 1.25 mg/dL Final    Comment:    For patients >65 years of age, the reference limit for Creatinine is approximately 13% higher for people identified as African-American. .    Creatinine, Ser  Date Value Ref Range Status  03/21/2020 0.80 0.61 - 1.24 mg/dL Final   Creatinine, Urine  Date Value Ref Range Status  03/14/2020 84 20 - 320 mg/dL Final          Passed - eGFR in normal range and within 360 days    GFR, Est African American  Date Value Ref Range Status  03/14/2020 112 > OR = 60 mL/min/1.30m2 Final   GFR, Est Non African American  Date Value Ref Range Status  03/14/2020 97 > OR = 60 mL/min/1.51m2 Final          Passed - Valid encounter within last 6 months    Recent Outpatient Visits           6 days ago Sudden onset unilateral headache   [facility], Drue Stager, MD   3 months ago Dyslipidemia associated with type 2 diabetes mellitus [facility])   [facility], Drue Stager, MD   7 months ago Dyslipidemia associated with type 2 diabetes mellitus [facility])   [facility] Steele Sizer, MD   8 months ago Enlarged parotid gland   [facility] Steele Sizer, MD   9 months ago Unstable angina [facility])   [facility] Steele Sizer, MD       Future Appointments             In 1 week Steele Sizer, MD [facility], [place]   In 4 weeks Delsa Grana, PA-C [facility], [place]   In 1 month Brendolyn Patty, MD [place]   In 10 months  [facility], [facility]

## 2020-11-08 ENCOUNTER — Ambulatory Visit: Payer: Medicare Other

## 2020-11-08 ENCOUNTER — Other Ambulatory Visit: Payer: Self-pay

## 2020-11-08 ENCOUNTER — Ambulatory Visit: Payer: Medicare Other | Admitting: Family Medicine

## 2020-11-08 DIAGNOSIS — R519 Headache, unspecified: Secondary | ICD-10-CM

## 2020-11-08 MED ORDER — ERENUMAB-AOOE 140 MG/ML ~~LOC~~ SOAJ
140.0000 mg | Freq: Once | SUBCUTANEOUS | Status: AC
  Administered 2020-11-08: 140 mg via SUBCUTANEOUS

## 2020-11-10 ENCOUNTER — Ambulatory Visit
Admission: RE | Admit: 2020-11-10 | Discharge: 2020-11-10 | Disposition: A | Discharge status: Home / Self Care | Payer: Medicare Other | Source: Ambulatory Visit | Attending: Family Medicine | Admitting: Family Medicine

## 2020-11-10 ENCOUNTER — Other Ambulatory Visit: Payer: Self-pay

## 2020-11-10 DIAGNOSIS — R519 Headache, unspecified: Secondary | ICD-10-CM | POA: Diagnosis not present

## 2020-11-10 MED ORDER — GADOBUTROL 1 MMOL/ML IV SOLN
10.0000 mL | Freq: Once | INTRAVENOUS | Status: AC | PRN
  Administered 2020-11-10: 10 mL via INTRAVENOUS

## 2020-11-12 NOTE — Progress Notes (Signed)
Name: Jimmy Green   MRN: [id number]    DOB: 1951/05/26   Date:11/13/2020       Progress Note  Subjective  Chief Complaint  Follow Up  HPI  Depression Major:taking medication daily, no suicidal thoughts or ideation,he is currently in remission. PHq9is zero. No side effects of medications.   RLS:he is taking Gabapentin 300 mg qhs and it helps with symptoms. Unchanged   GERD/Cough: he is doing well on Omeprazole taking two 20 mg daily, we will change to 40 mg once daily  , denies cough or heartburn   Dyslipidemia with type II DM: he denies polyphagia, polydipsia or polyuria. A1C is gradually going up and today is 8.6 %, he has noticed some increased in urinary frequency and has nocturia. He denies polyphagia or polydipsia. He has associated HTN, obesity and dyslipidemia. He is taking Rosuvastatin daily, also on ARB. Discussed importance of weight loss. We will add [place].   HTN: she has been taking medication daily. BP today has improved. No chest pain or palpitation   Hyperlipidemia:he was taking Atorvastatin  LDL improved , last LDL was 60 , continue Crestor    OSA: he got a CPAP mask was not fitting properly and causing right forehead pain , ordered by Dr. Jacqualine Code has new supplies but still causes pain over the incision on left side of face, therefore he is not using it again.   History of basal cell carcinoma: he was seeing Dr. Nicole Kindred every 6 months but lost to follow up, he has an appointment coming up soon   CAD: taking aspirin, statin and beta blocker and ARB,and he has NTG in his pocket, but has not used it yet.. Nochest pain or decrease in exercise tolerance. He saw Dr. Ubaldo Glassing 2019 , and had another heart cath that showed no significant disease, 55-70% stenosis on the LAD. He went back 11/2017 and advised to have CT angiogram but did not have it done. LDL at goal , continue medication  Left hip replacement history :under the care of Dr. Primus Bravo on pain  medication and pain today is4-5 /10, still under the care of pain clinic, symptoms usually present during activitybetter with rest, worse when cold .   BPH: reviewed last labs PSA was 1.7 , discussed repeating level today but he states he wants to wait   Morbid Obesity: his BMI is above 35 with co-morbidities, his weight is stable now, but needs to lose weight  . He has DM, HTN, OA, CAD    B12 : he is off otc supplementation because it cause oral lesions, he is getting monthly B12 injections without problems . Unchanged   History of left parodectomy on 05/07/2020 , it was done in [facility] , since the surgery he has noticed lower lip numbness and some palsy on left side of face, stable .      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, consistent with changes due to prior core biopsy - Tumor is 3.9 cm, margins negative - Three benign lymph nodes within background benign parotid gland tissue - One of the benign lymph nodes contains a benign lymphoepithelial cyst (0.4 cm) - Negative for malignancy    Unilateral headache: he states he had similar pain years ago, he states this time around the pain is much stronger.The pain was constant for a period of two weeks and he came in 03/30 for evaluation, improved with Tylenol  He states  without medication the pain is intense, feels like his right eye will pop out, he states tender to touch over right eye lid, it can be sharp, dull, stabbing or throbbing. Denies tearing or rhinorrhea. No associated with nausea and vomiting, he states worse when coughing, the pain is so intense at time  that he wakes up with pain during the night and has to apply a pillow to his face or sit up straight to decrease pain. He also has photophobia. No change in his vision. Denies any neuro focal deficit. He has seen Dr. Melrose Nakayama in the past for dizziness but not for headaches. We tried prednisone  , Aimovig and he states pain no longer daily but at least every other day and still very bothersome. MRI this week was negative, explained he may need CTA but we will refer him to neurologist of his choice first .   MRI 11/11/2020  No acute abnormality  Mild white matter changes, with progression since 2017. Likely due to chronic microvascular ischemia.  CT 2017  1. No acute intracranial abnormality. 2. 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  MRI 08/2015   IMPRESSION: Atrophy and chronic microvascular ischemic change. No acute intracranial abnormality.  No cause for dizziness or hearing loss identified.  Patient Active Problem List   Diagnosis Date Noted  . Personal history of colonic polyps   . Polyp of transverse colon   . Parotid mass 04/17/2020  . BPH with obstruction/lower urinary tract symptoms 05/20/2018  . Basal cell carcinoma 03/28/2018  . History of basal cell carcinoma 03/23/2018  . Primary localized osteoarthritis of left hip 04/06/2017  . Dyslipidemia associated with type 2 diabetes mellitus ([place]) 10/04/2016  . Trigger thumb of both thumbs 06/02/2016  . Chronic tension-type headache, intractable 09/25/2015  . Benign paroxysmal positional vertigo due to bilateral vestibular disorder 09/25/2015  . Hearing loss sensory, bilateral 08/26/2015  . Cerebral microvascular disease 08/26/2015  . Metabolic syndrome 16/38/4536  . Vertigo 06/21/2015  . Buzzing in ear 06/21/2015  . Hypertension goal BP (blood pressure) < 140/90 03/28/2015  . GERD (gastroesophageal reflux disease) 03/28/2015  . Hyperlipidemia 03/28/2015  . Depression, major 03/28/2015  . CAD in native artery 03/28/2015  . Actinic keratosis 02/14/2015  . Obstructive sleep apnea 02/03/2015  . Diverticulosis of colon without hemorrhage 01/31/2015  . Hx of colonic polyps   . Benign  neoplasm of descending colon   . Benign neoplasm of sigmoid colon   . Idiopathic colitis   . Sebaceous cyst 09/14/2014    Past Surgical History:  Procedure Laterality Date  . BACK SURGERY    . BASAL CELL CARCINOMA EXCISION Left 12/02/2015   Chest-Done by Dermatologist   . BASAL CELL CARCINOMA EXCISION Left 02/21/2018   Nodulocytic pattern, deep margin involved [facility] Dr. Brendolyn Patty  . BASAL CELL CARCINOMA EXCISION  02/23/2018  . CARDIAC CATHETERIZATION Left 04/01/2016   Procedure: Left Heart Cath and Coronary Angiography;  Surgeon: Teodoro Spray, MD;  Location: [facility];  Service: Cardiovascular;  Laterality: Left;  . CARDIAC CATHETERIZATION N/A 04/01/2016   Procedure: Intravascular Pressure Wire/FFR Study;  Surgeon: Isaias Cowman, MD;  Location: [facility];  Service: Cardiovascular;  Laterality: N/A;  . COLONOSCOPY N/A 01/15/2015   Wohl-ileitis, 2 benign polyps, cryptitis, sigmoid diverticulosis, focal ulceration ICV  . COLONOSCOPY WITH PROPOFOL N/A 08/29/2020   Procedure: COLONOSCOPY  WITH PROPOFOL;  Surgeon: Lucilla Lame, MD;  Location: [facility] ENDOSCOPY;  Service: Endoscopy;  Laterality: N/A;  . CORONARY STENT PLACEMENT    . FRACTIONAL FLOW RESERVE WIRE  10/08/2011   Procedure: FRACTIONAL FLOW RESERVE WIRE;  Surgeon: Clent Demark, MD;  Location: [facility] CATH LAB;  Service: Cardiovascular;;  . HERNIA REPAIR    . KNEE SURGERY    . LEFT HEART CATHETERIZATION WITH CORONARY ANGIOGRAM N/A 10/08/2011   Procedure: LEFT HEART CATHETERIZATION WITH CORONARY ANGIOGRAM;  Surgeon: Clent Demark, MD;  Location: [facility];  Service: Cardiovascular;  Laterality: N/A;  . parodectomy Left 05/07/2020  . SKIN CANCER EXCISION  01/2018   [facility]  . TOTAL HIP ARTHROPLASTY Left 04/06/2017   Procedure: TOTAL HIP ARTHROPLASTY ANTERIOR APPROACH;  Surgeon: Hessie Knows, MD;  Location: [facility] ORS;  Service: Orthopedics;  Laterality: Left;    Family History   Problem Relation Age of Onset  . Lung cancer Father   . Heart disease Father   . Skin cancer Father     Social History   Tobacco Use  . Smoking status: Former Smoker    Years: 40.00    Types: Cigarettes    Quit date: 08/03/2012    Years since quitting: 8.2  . Smokeless tobacco: Never Used  Substance Use Topics  . Alcohol use: No    Alcohol/week: 0.0 standard drinks     Current Outpatient Medications:  .  aspirin EC 81 MG tablet, Take 81 mg by mouth every morning. , Disp: , Rfl:  .  dapagliflozin propanediol (FARXIGA) 10 MG TABS tablet, Take 1 tablet (10 mg total) by mouth daily before breakfast., Disp: 90 tablet, Rfl: 1 .  nitroGLYCERIN (NITROSTAT) 0.4 MG SL tablet, DISSOLVE 1 TABLET UNDER THE TONGUE EVERY 5 MINUTES FOR UP TO 3 DOSES AS NEEDED FOR CHEST PAIN. IF NO RELIEF AFTER 3 DOSES, CALL 911 OR GO TO ER., Disp: 25 tablet, Rfl: 0 .  olmesartan-hydrochlorothiazide (BENICAR HCT) 40-12.5 MG tablet, TAKE 1 TABLET BY MOUTH DAILY FOR BLOOD PRESSURE. STOP LOSARTAN HCTZ, Disp: 90 tablet, Rfl: 1 .  omeprazole (PRILOSEC) 40 MG capsule, Take 1 capsule (40 mg total) by mouth daily., Disp: 90 capsule, Rfl: 1 .  oxyCODONE (ROXICODONE) 15 MG immediate release tablet, Take 1 tablet by mouth 5 (five) times daily as needed., Disp: , Rfl:  .  PREVIDENT 5000 SENSITIVE 1.1-5 % PSTE, Apply 1 each topically 2 (two) times daily., Disp: , Rfl: 5 .  Accu-Chek FastClix Lancets MISC, USE AS INSTRUCTED ONCE DAILY (Patient not taking: No sig reported), Disp: 102 each, Rfl: 12 .  ACCU-CHEK GUIDE test strip, USE AS INSTRUCTED ONCE DAILY (Patient not taking: No sig reported), Disp: 100 strip, Rfl: 12 .  amLODipine (NORVASC) 2.5 MG tablet, Take 1 tablet (2.5 mg total) by mouth daily., Disp: 90 tablet, Rfl: 1 .  buPROPion (WELLBUTRIN XL) 300 MG 24 hr tablet, Take 1 tablet (300 mg total) by mouth daily. New dose, instead of 3 of 150 mg, Disp: 90 tablet, Rfl: 1 .  escitalopram (LEXAPRO) 20 MG tablet, Take 1 tablet  (20 mg total) by mouth daily., Disp: 90 tablet, Rfl: 1 .  gabapentin (NEURONTIN) 300 MG capsule, Take 1 capsule (300 mg total) by mouth at bedtime., Disp: 90 capsule, Rfl: 1 .  metFORMIN (GLUCOPHAGE-XR) 750 MG 24 hr tablet, Take 2 tablets (1,500 mg total) by mouth daily with breakfast., Disp: 180 tablet, Rfl: 0 .  metoprolol succinate (TOPROL-XL) 50 MG 24 hr tablet, Take  1 tablet (50 mg total) by mouth every evening. Take with or immediately following a meal., Disp: 90 tablet, Rfl: 1 .  NARCAN 4 MG/0.1ML LIQD nasal spray kit, , Disp: , Rfl: 0 .  rosuvastatin (CRESTOR) 40 MG tablet, Take 1 tablet (40 mg total) by mouth daily., Disp: 90 tablet, Rfl: 3  Allergies  Allergen Reactions  . Elavil [Amitriptyline Hcl] Rash  . Tape Rash    Paper Tape    I personally reviewed active problem list, medication list, allergies, family history, social history, health maintenance with the patient/caregiver today.   ROS  Constitutional: Negative for fever or weight change.  Respiratory: Negative for cough and shortness of breath.   Cardiovascular: Negative for chest pain or palpitations.  Gastrointestinal: Negative for abdominal pain, no bowel changes.  Musculoskeletal: Negative for gait problem or joint swelling.  Skin: Negative for rash.  Neurological: Negative for dizziness, positive  headache.  No other specific complaints in a complete review of systems (except as listed in HPI above).  Objective  Vitals:   11/13/20 0848  BP: 138/84  Pulse: 75  Resp: 18  Temp: 98.1 F (36.7 C)  TempSrc: Oral  SpO2: 97%  Weight: 227 lb (103 kg)  Height: _0  (1.753 m)    Body mass index is 33.52 kg/m.  Physical Exam  Constitutional: Patient appears well-developed and well-nourished. Obese No distress.  HEENT: head atraumatic, normocephalic, pupils equal and reactive to light, , neck supple Cardiovascular: Normal rate, regular rhythm and normal heart sounds.  No murmur heard. Trace  BLE  edema. Pulmonary/Chest: Effort normal and breath sounds normal. No respiratory distress. Abdominal: Soft.  There is no tenderness. Psychiatric: Patient has a normal mood and affect. behavior is normal. Judgment and thought content normal.  Recent Results (from the past 2160 hour(s))  SARS CORONAVIRUS 2 (TAT 6-24 HRS) Nasopharyngeal Nasopharyngeal Swab     Status: None   Collection Time: 08/16/20  8:48 AM   Specimen: Nasopharyngeal Swab  Result Value Ref Range   SARS Coronavirus 2 NEGATIVE NEGATIVE    Comment: (NOTE) SARS-CoV-2 target nucleic acids are NOT DETECTED.  The SARS-CoV-2 RNA is generally detectable in upper and lower respiratory specimens during the acute phase of infection. Negative results do not preclude SARS-CoV-2 infection, do not rule out co-infections with other pathogens, and should not be used as the sole basis for treatment or other patient management decisions. Negative results must be combined with clinical observations, patient history, and epidemiological information. The expected result is Negative.  Fact Sheet for Patients: SugarRoll.be  Fact Sheet for Healthcare Providers: https://www.woods-mathews.com/  This test is not yet approved or cleared by the [place] FDA and  has been authorized for detection and/or diagnosis of SARS-CoV-2 by FDA under an Emergency Use Authorization (EUA). This EUA will remain  in effect (meaning this test can be used) for the duration of the COVID-19 declaration under Se ction 564(b)(1) of the Act, 21 U.S.C. section 360bbb-3(b)(1), unless the authorization is terminated or revoked sooner.  Performed at [facility], [place] [address], [place], [place] [address]   SARS CORONAVIRUS 2 (TAT 6-24 HRS) Nasopharyngeal Nasopharyngeal Swab     Status: None   Collection Time: 08/27/20  8:24 AM   Specimen: Nasopharyngeal Swab  Result Value Ref Range   SARS Coronavirus 2 NEGATIVE  NEGATIVE    Comment: (NOTE) SARS-CoV-2 target nucleic acids are NOT DETECTED.  The SARS-CoV-2 RNA is generally detectable in upper and lower respiratory specimens during  the acute phase of infection. Negative results do not preclude SARS-CoV-2 infection, do not rule out co-infections with other pathogens, and should not be used as the sole basis for treatment or other patient management decisions. Negative results must be combined with clinical observations, patient history, and epidemiological information. The expected result is Negative.  Fact Sheet for Patients: SugarRoll.be  Fact Sheet for Healthcare Providers: https://www.woods-mathews.com/  This test is not yet approved or cleared by the [place] FDA and  has been authorized for detection and/or diagnosis of SARS-CoV-2 by FDA under an Emergency Use Authorization (EUA). This EUA will remain  in effect (meaning this test can be used) for the duration of the COVID-19 declaration under Se ction 564(b)(1) of the Act, 21 U.S.C. section 360bbb-3(b)(1), unless the authorization is terminated or revoked sooner.  Performed at [facility], [place] [address] [place], [place] [address]   Surgical pathology     Status: None   Collection Time: 08/29/20  9:31 AM  Result Value Ref Range   SURGICAL PATHOLOGY      SURGICAL PATHOLOGY CASE: [id number] PATIENT: [name] Surgical Pathology Report     Specimen Submitted: A. Colon polyp, ascending; cs B. Colon polyp, transverse; cs  Clinical History: History of colon polyps, colon polyps      DIAGNOSIS: A. COLON POLYP, ASCENDING; COLD SNARE: - HYPERPLASTIC POLYP. - NEGATIVE FOR DYSPLASIA AND MALIGNANCY.  B.  COLON POLYP, TRANSVERSE; COLD SNARE: - HYPERPLASTIC POLYP. - NEGATIVE FOR DYSPLASIA AND MALIGNANCY.  GROSS DESCRIPTION: A. Labeled: Cold snare ascending colon polyp Received: Formalin Collection time: 9:31 AM  on 08/29/2020 Placed into formalin time: 9:31 AM on 08/29/2020 Tissue fragment(s): 1 Size: 0.6 x 0.4 x 0.2 cm Description: Tan soft tissue fragment Entirely submitted in 1 cassette.  B. Labeled: Cold snare transverse colon polyp Received: Formalin Collection time: 9:34 AM on 08/29/2020 Placed into formalin time: 9:34 AM on 08/29/2020 Tissue fragment(s): Multiple Size: Aggregate, 1.8 x 0.4 x 0.2 c m Description: Received is a single fragment of tan soft tissue admixed with fecal matter.  The ratio of soft to tissue fecal matter is 50: 50. Entirely submitted in 1 cassette.  Final Diagnosis performed by Quay Burow, MD.   Electronically signed 08/30/2020 3:05:38PM The electronic signature indicates that the named Attending Pathologist has evaluated the specimen Technical component performed at [facility], [address] [place], [place] [address] Lab: [phone] Dir: Rush Farmer, MD, MMM  Professional component performed at [facility], [facility], [place], [place], [place] [address] Lab: [phone] Dir: Dellia Nims. Rubinas, MD   POCT HgB A1C     Status: Abnormal   Collection Time: 11/13/20  9:01 AM  Result Value Ref Range   Hemoglobin A1C 8.6 (A) 4.0 - 5.6 %   HbA1c POC (<> result, manual entry)     HbA1c, POC (prediabetic range)     HbA1c, POC (controlled diabetic range)       PHQ2/9: Depression screen [facility] 2/9 11/13/2020 10/30/2020 09/12/2020 07/15/2020 03/14/2020  Decreased Interest 0 0 0 0 0  Down, Depressed, Hopeless 0 0 0 0 0  PHQ - 2 Score 0 0 0 0 0  Altered sleeping 0 0 - - 0  Tired, decreased energy 0 0 - - 0  Change in appetite 0 0 - - 0  Feeling bad or failure about yourself  0 0 - - 0  Trouble concentrating 0 0 - - 0  Moving slowly or fidgety/restless 0 0 - - 0  Suicidal thoughts 0 0 - - 0  PHQ-9 Score 0 0 - - 0  Difficult doing work/chores - - - - -  Some recent data might be hidden    phq 9 is negative   Fall Risk: Fall Risk  11/13/2020  10/30/2020 09/12/2020 07/15/2020 03/14/2020  Falls in the past year? 0 0 0 0 0  Number falls in past yr: - 0 0 0 0  Injury with Fall? - 0 0 0 0  Risk for fall due to : - - No Fall Risks - -  Follow up Falls prevention discussed - Falls prevention discussed - -     Functional Status Survey: Is the patient deaf or have difficulty hearing?: Yes Does the patient have difficulty seeing, even when wearing glasses/contacts?: No Does the patient have difficulty concentrating, remembering, or making decisions?: No Does the patient have difficulty walking or climbing stairs?: Yes Does the patient have difficulty dressing or bathing?: No Does the patient have difficulty doing errands alone such as visiting a doctor's office or shopping?: No    Assessment & Plan  1. Hypertension associated with diabetes ([place])  - POCT HgB A1C - CBC with Differential/Platelet - amLODipine (NORVASC) 2.5 MG tablet; Take 1 tablet (2.5 mg total) by mouth daily.  Dispense: 90 tablet; Refill: 1 - metoprolol succinate (TOPROL-XL) 50 MG 24 hr tablet; Take 1 tablet (50 mg total) by mouth every evening. Take with or immediately following a meal.  Dispense: 90 tablet; Refill: 1 - dapagliflozin propanediol (FARXIGA) 10 MG TABS tablet; Take 1 tablet (10 mg total) by mouth daily before breakfast.  Dispense: 90 tablet; Refill: 1  2. Sudden onset unilateral headache  - Ambulatory referral to Neurology  3. Hypertension, benign  - COMPLETE METABOLIC PANEL WITH GFR - amLODipine (NORVASC) 2.5 MG tablet; Take 1 tablet (2.5 mg total) by mouth daily.  Dispense: 90 tablet; Refill: 1 - metoprolol succinate (TOPROL-XL) 50 MG 24 hr tablet; Take 1 tablet (50 mg total) by mouth every evening. Take with or immediately following a meal.  Dispense: 90 tablet; Refill: 1  4. Depression, major, recurrent, mild ([facility])  - buPROPion (WELLBUTRIN XL) 300 MG 24 hr tablet; Take 1 tablet (300 mg total) by mouth daily. New dose, instead of 3 of 150 mg   Dispense: 90 tablet; Refill: 1 - escitalopram (LEXAPRO) 20 MG tablet; Take 1 tablet (20 mg total) by mouth daily.  Dispense: 90 tablet; Refill: 1  5. RLS (restless legs syndrome)  - gabapentin (NEURONTIN) 300 MG capsule; Take 1 capsule (300 mg total) by mouth at bedtime.  Dispense: 90 capsule; Refill: 1  6. Dyslipidemia associated with type 2 diabetes mellitus ([facility])  - metFORMIN (GLUCOPHAGE-XR) 750 MG 24 hr tablet; Take 2 tablets (1,500 mg total) by mouth daily with breakfast.  Dispense: 180 tablet; Refill: 0 - rosuvastatin (CRESTOR) 40 MG tablet; Take 1 tablet (40 mg total) by mouth daily.  Dispense: 90 tablet; Refill: 3 - dapagliflozin propanediol (FARXIGA) 10 MG TABS tablet; Take 1 tablet (10 mg total) by mouth daily before breakfast.  Dispense: 90 tablet; Refill: 1  7. Unstable angina ([facility])  - metoprolol succinate (TOPROL-XL) 50 MG 24 hr tablet; Take 1 tablet (50 mg total) by mouth every evening. Take with or immediately following a meal.  Dispense: 90 tablet; Refill: 1  8. CAD in native artery  - metoprolol succinate (TOPROL-XL) 50 MG 24 hr tablet; Take 1 tablet (50 mg total) by mouth every evening. Take with or immediately following  a meal.  Dispense: 90 tablet; Refill: 1  9. Gastroesophageal reflux disease without esophagitis  - omeprazole (PRILOSEC) 40 MG capsule; Take 1 capsule (40 mg total) by mouth daily.  Dispense: 90 capsule; Refill: 1

## 2020-11-13 ENCOUNTER — Ambulatory Visit (INDEPENDENT_AMBULATORY_CARE_PROVIDER_SITE_OTHER): Payer: Medicare Other | Admitting: Family Medicine

## 2020-11-13 ENCOUNTER — Encounter: Payer: Self-pay | Admitting: Family Medicine

## 2020-11-13 ENCOUNTER — Other Ambulatory Visit: Payer: Self-pay

## 2020-11-13 VITALS — BP 138/84 | HR 75 | Temp 98.1°F | Resp 18 | Ht 69.0 in | Wt 227.0 lb

## 2020-11-13 DIAGNOSIS — I10 Essential (primary) hypertension: Secondary | ICD-10-CM | POA: Diagnosis not present

## 2020-11-13 DIAGNOSIS — E1159 Type 2 diabetes mellitus with other circulatory complications: Secondary | ICD-10-CM | POA: Diagnosis not present

## 2020-11-13 DIAGNOSIS — I2510 Atherosclerotic heart disease of native coronary artery without angina pectoris: Secondary | ICD-10-CM

## 2020-11-13 DIAGNOSIS — F330 Major depressive disorder, recurrent, mild: Secondary | ICD-10-CM | POA: Diagnosis not present

## 2020-11-13 DIAGNOSIS — K219 Gastro-esophageal reflux disease without esophagitis: Secondary | ICD-10-CM | POA: Diagnosis not present

## 2020-11-13 DIAGNOSIS — R519 Headache, unspecified: Secondary | ICD-10-CM | POA: Diagnosis not present

## 2020-11-13 DIAGNOSIS — E785 Hyperlipidemia, unspecified: Secondary | ICD-10-CM

## 2020-11-13 DIAGNOSIS — I152 Hypertension secondary to endocrine disorders: Secondary | ICD-10-CM | POA: Diagnosis not present

## 2020-11-13 DIAGNOSIS — G2581 Restless legs syndrome: Secondary | ICD-10-CM | POA: Diagnosis not present

## 2020-11-13 DIAGNOSIS — N138 Other obstructive and reflux uropathy: Secondary | ICD-10-CM

## 2020-11-13 DIAGNOSIS — I200 Unstable angina: Secondary | ICD-10-CM | POA: Diagnosis not present

## 2020-11-13 DIAGNOSIS — E1169 Type 2 diabetes mellitus with other specified complication: Secondary | ICD-10-CM

## 2020-11-13 DIAGNOSIS — N401 Enlarged prostate with lower urinary tract symptoms: Secondary | ICD-10-CM

## 2020-11-13 DIAGNOSIS — E538 Deficiency of other specified B group vitamins: Secondary | ICD-10-CM

## 2020-11-13 LAB — POCT GLYCOSYLATED HEMOGLOBIN (HGB A1C): Hemoglobin A1C: 8.6 % — AB (ref 4.0–5.6)

## 2020-11-13 MED ORDER — OMEPRAZOLE 40 MG PO CPDR: 40.0000 mg | DELAYED_RELEASE_CAPSULE | Freq: Every day | ORAL | 1 refills | Status: DC

## 2020-11-13 MED ORDER — DAPAGLIFLOZIN PROPANEDIOL 10 MG PO TABS: 10.0000 mg | ORAL_TABLET | Freq: Every day | ORAL | 1 refills | Status: DC

## 2020-11-13 MED ORDER — BUPROPION HCL ER (XL) 300 MG PO TB24: 300.0000 mg | ORAL_TABLET | Freq: Every day | ORAL | 1 refills | Status: DC

## 2020-11-13 MED ORDER — METFORMIN HCL ER 750 MG PO TB24: 1500.0000 mg | ORAL_TABLET | Freq: Every day | ORAL | 0 refills | Status: DC

## 2020-11-13 MED ORDER — METOPROLOL SUCCINATE ER 50 MG PO TB24: 50.0000 mg | ORAL_TABLET | Freq: Every evening | ORAL | 1 refills | Status: DC

## 2020-11-13 MED ORDER — ROSUVASTATIN CALCIUM 40 MG PO TABS: 40.0000 mg | ORAL_TABLET | Freq: Every day | ORAL | 3 refills | Status: DC

## 2020-11-13 MED ORDER — AMLODIPINE BESYLATE 2.5 MG PO TABS: 2.5000 mg | ORAL_TABLET | Freq: Every day | ORAL | 1 refills | Status: DC

## 2020-11-13 MED ORDER — GABAPENTIN 300 MG PO CAPS: 300.0000 mg | ORAL_CAPSULE | Freq: Every day | ORAL | 1 refills | Status: DC

## 2020-11-13 MED ORDER — ESCITALOPRAM OXALATE 20 MG PO TABS: 20.0000 mg | ORAL_TABLET | Freq: Every day | ORAL | 1 refills | Status: DC

## 2020-11-14 ENCOUNTER — Encounter: Payer: Self-pay | Admitting: Family Medicine

## 2020-11-14 LAB — PSA: PSA: 1.48 ng/mL (ref <=4.0)

## 2020-11-14 LAB — CBC WITH DIFFERENTIAL/PLATELET
Absolute Monocytes: 517 cells/uL (ref 200–950)
Basophils Absolute: 61 cells/uL (ref 0–200)
Basophils Relative: 0.9 %
Eosinophils Absolute: 177 cells/uL (ref 15–500)
Eosinophils Relative: 2.6 %
HCT: 47.1 % (ref 38.5–50.0)
Hemoglobin: 15.7 g/dL (ref 13.2–17.1)
Lymphs Abs: 1435 cells/uL (ref 850–3900)
MCH: 30.2 pg (ref 27.0–33.0)
MCHC: 33.3 g/dL (ref 32.0–36.0)
MCV: 90.6 fL (ref 80.0–100.0)
MPV: 10.8 fL (ref 7.5–12.5)
Monocytes Relative: 7.6 %
Neutro Abs: 4610 cells/uL (ref 1500–7800)
Neutrophils Relative %: 67.8 %
Platelets: 264 10*3/uL (ref 140–400)
RBC: 5.2 10*6/uL (ref 4.20–5.80)
RDW: 13.5 % (ref 11.0–15.0)
Total Lymphocyte: 21.1 %
WBC: 6.8 10*3/uL (ref 3.8–10.8)

## 2020-11-14 LAB — COMPLETE METABOLIC PANEL WITH GFR
AG Ratio: 1.6 (calc) (ref 1.0–2.5)
ALT: 40 U/L (ref 9–46)
AST: 31 U/L (ref 10–35)
Albumin: 4 g/dL (ref 3.6–5.1)
Alkaline phosphatase (APISO): 107 U/L (ref 35–144)
BUN/Creatinine Ratio: 17 (calc) (ref 6–22)
BUN: 10 mg/dL (ref 7–25)
CO2: 29 mmol/L (ref 20–32)
Calcium: 9.5 mg/dL (ref 8.6–10.3)
Chloride: 101 mmol/L (ref 98–110)
Creat: 0.6 mg/dL — ABNORMAL LOW (ref 0.70–1.25)
GFR, Est African American: 120 mL/min/{1.73_m2} (ref >=60)
GFR, Est Non African American: 103 mL/min/{1.73_m2} (ref >=60)
Globulin: 2.5 g/dL (calc) (ref 1.9–3.7)
Glucose, Bld: 216 mg/dL — ABNORMAL HIGH (ref 65–99)
Potassium: 4.3 mmol/L (ref 3.5–5.3)
Sodium: 138 mmol/L (ref 135–146)
Total Bilirubin: 1.1 mg/dL (ref 0.2–1.2)
Total Protein: 6.5 g/dL (ref 6.1–8.1)

## 2020-11-14 LAB — B12 AND FOLATE PANEL
Folate: 10.4 ng/mL
Vitamin B-12: 667 pg/mL (ref 200–1100)

## 2020-11-24 DIAGNOSIS — G4733 Obstructive sleep apnea (adult) (pediatric): Secondary | ICD-10-CM | POA: Diagnosis not present

## 2020-11-26 DIAGNOSIS — G894 Chronic pain syndrome: Secondary | ICD-10-CM | POA: Diagnosis not present

## 2020-11-27 ENCOUNTER — Ambulatory Visit (INDEPENDENT_AMBULATORY_CARE_PROVIDER_SITE_OTHER): Payer: Medicare Other

## 2020-11-27 DIAGNOSIS — E538 Deficiency of other specified B group vitamins: Secondary | ICD-10-CM

## 2020-11-27 MED ORDER — CYANOCOBALAMIN 1000 MCG/ML IJ SOLN
1000.0000 ug | Freq: Once | INTRAMUSCULAR | Status: AC
  Administered 2020-11-27: 1000 ug via INTRAMUSCULAR

## 2020-12-03 ENCOUNTER — Encounter: Payer: Medicare Other | Admitting: Family Medicine

## 2020-12-09 ENCOUNTER — Ambulatory Visit (INDEPENDENT_AMBULATORY_CARE_PROVIDER_SITE_OTHER): Payer: Medicare Other

## 2020-12-09 DIAGNOSIS — R519 Headache, unspecified: Secondary | ICD-10-CM | POA: Diagnosis not present

## 2020-12-09 MED ORDER — ERENUMAB-AOOE 140 MG/ML ~~LOC~~ SOAJ
140.0000 mg | Freq: Once | SUBCUTANEOUS | Status: AC
  Administered 2020-12-09: 140 mg via SUBCUTANEOUS

## 2020-12-20 DIAGNOSIS — E119 Type 2 diabetes mellitus without complications: Secondary | ICD-10-CM | POA: Diagnosis not present

## 2020-12-20 LAB — HM DIABETES EYE EXAM

## 2020-12-24 ENCOUNTER — Ambulatory Visit: Payer: Medicare Other | Admitting: Dermatology

## 2020-12-24 DIAGNOSIS — M9973 Connective tissue and disc stenosis of intervertebral foramina of lumbar region: Secondary | ICD-10-CM | POA: Diagnosis not present

## 2020-12-24 DIAGNOSIS — M5136 Other intervertebral disc degeneration, lumbar region: Secondary | ICD-10-CM | POA: Diagnosis not present

## 2020-12-24 DIAGNOSIS — M4807 Spinal stenosis, lumbosacral region: Secondary | ICD-10-CM | POA: Diagnosis not present

## 2020-12-24 DIAGNOSIS — R519 Headache, unspecified: Secondary | ICD-10-CM | POA: Diagnosis not present

## 2020-12-24 DIAGNOSIS — M79669 Pain in unspecified lower leg: Secondary | ICD-10-CM | POA: Diagnosis not present

## 2020-12-24 DIAGNOSIS — M48062 Spinal stenosis, lumbar region with neurogenic claudication: Secondary | ICD-10-CM | POA: Diagnosis not present

## 2020-12-24 DIAGNOSIS — M9931 Osseous stenosis of neural canal of cervical region: Secondary | ICD-10-CM | POA: Diagnosis not present

## 2020-12-24 DIAGNOSIS — M25519 Pain in unspecified shoulder: Secondary | ICD-10-CM | POA: Diagnosis not present

## 2020-12-24 DIAGNOSIS — M47897 Other spondylosis, lumbosacral region: Secondary | ICD-10-CM | POA: Diagnosis not present

## 2020-12-24 DIAGNOSIS — M792 Neuralgia and neuritis, unspecified: Secondary | ICD-10-CM | POA: Diagnosis not present

## 2020-12-24 DIAGNOSIS — M25559 Pain in unspecified hip: Secondary | ICD-10-CM | POA: Diagnosis not present

## 2020-12-24 DIAGNOSIS — G894 Chronic pain syndrome: Secondary | ICD-10-CM | POA: Diagnosis not present

## 2020-12-24 DIAGNOSIS — G8929 Other chronic pain: Secondary | ICD-10-CM | POA: Diagnosis not present

## 2020-12-24 DIAGNOSIS — M9951 Intervertebral disc stenosis of neural canal of cervical region: Secondary | ICD-10-CM | POA: Diagnosis not present

## 2020-12-24 DIAGNOSIS — G4733 Obstructive sleep apnea (adult) (pediatric): Secondary | ICD-10-CM | POA: Diagnosis not present

## 2020-12-24 DIAGNOSIS — M545 Low back pain, unspecified: Secondary | ICD-10-CM | POA: Diagnosis not present

## 2020-12-31 ENCOUNTER — Telehealth: Payer: Self-pay

## 2020-12-31 NOTE — Telephone Encounter (Signed)
B12 due now, Migraine due 01/09/21. Left vm letting wife know.

## 2020-12-31 NOTE — Telephone Encounter (Signed)
Copied from [place] [id number]. Topic: Appointment Scheduling - Scheduling Inquiry for Clinic >> Dec 31, 2020 10:53 AM Loma Boston wrote: Reason for CRM:Pt wife, Mary Sella is questioning went it is time for her husband's migraine injeection and also his b-12. She cannot remember how often and how far apart they are. Call back after 3:00 am today for scheduling if appropriate.  [phone]

## 2021-01-01 ENCOUNTER — Other Ambulatory Visit: Payer: Self-pay

## 2021-01-01 ENCOUNTER — Telehealth (INDEPENDENT_AMBULATORY_CARE_PROVIDER_SITE_OTHER): Payer: Medicare Other

## 2021-01-01 DIAGNOSIS — E538 Deficiency of other specified B group vitamins: Secondary | ICD-10-CM | POA: Diagnosis not present

## 2021-01-01 MED ORDER — CYANOCOBALAMIN 1000 MCG/ML IJ SOLN
1000.0000 ug | Freq: Once | INTRAMUSCULAR | Status: AC
  Administered 2021-01-01: 1000 ug via INTRAMUSCULAR

## 2021-01-06 ENCOUNTER — Other Ambulatory Visit: Payer: Self-pay | Admitting: Family Medicine

## 2021-01-06 DIAGNOSIS — R059 Cough, unspecified: Secondary | ICD-10-CM

## 2021-01-06 DIAGNOSIS — K219 Gastro-esophageal reflux disease without esophagitis: Secondary | ICD-10-CM

## 2021-01-09 ENCOUNTER — Other Ambulatory Visit: Payer: Self-pay

## 2021-01-09 ENCOUNTER — Ambulatory Visit (INDEPENDENT_AMBULATORY_CARE_PROVIDER_SITE_OTHER): Payer: Medicare Other

## 2021-01-09 DIAGNOSIS — R519 Headache, unspecified: Secondary | ICD-10-CM

## 2021-01-09 MED ORDER — ERENUMAB-AOOE 140 MG/ML ~~LOC~~ SOAJ
140.0000 mg | Freq: Once | SUBCUTANEOUS | Status: AC
  Administered 2021-01-09: 140 mg via SUBCUTANEOUS

## 2021-01-17 NOTE — Progress Notes (Signed)
B12 injection 

## 2021-01-21 DIAGNOSIS — M169 Osteoarthritis of hip, unspecified: Secondary | ICD-10-CM | POA: Diagnosis not present

## 2021-01-21 DIAGNOSIS — G894 Chronic pain syndrome: Secondary | ICD-10-CM | POA: Diagnosis not present

## 2021-01-21 DIAGNOSIS — M5136 Other intervertebral disc degeneration, lumbar region: Secondary | ICD-10-CM | POA: Diagnosis not present

## 2021-01-21 DIAGNOSIS — M545 Low back pain, unspecified: Secondary | ICD-10-CM | POA: Diagnosis not present

## 2021-01-24 DIAGNOSIS — G4733 Obstructive sleep apnea (adult) (pediatric): Secondary | ICD-10-CM | POA: Diagnosis not present

## 2021-01-27 ENCOUNTER — Ambulatory Visit (INDEPENDENT_AMBULATORY_CARE_PROVIDER_SITE_OTHER): Payer: Medicare Other

## 2021-01-27 ENCOUNTER — Other Ambulatory Visit: Payer: Self-pay

## 2021-01-27 DIAGNOSIS — E538 Deficiency of other specified B group vitamins: Secondary | ICD-10-CM | POA: Diagnosis not present

## 2021-01-27 MED ORDER — CYANOCOBALAMIN 1000 MCG/ML IJ SOLN
1000.0000 ug | Freq: Once | INTRAMUSCULAR | Status: AC
  Administered 2021-01-27: 1000 ug via INTRAMUSCULAR

## 2021-02-06 ENCOUNTER — Ambulatory Visit: Payer: Medicare Other

## 2021-02-10 ENCOUNTER — Other Ambulatory Visit: Payer: Self-pay | Admitting: Family Medicine

## 2021-02-10 ENCOUNTER — Telehealth: Payer: Self-pay

## 2021-02-10 DIAGNOSIS — R519 Headache, unspecified: Secondary | ICD-10-CM | POA: Diagnosis not present

## 2021-02-10 DIAGNOSIS — Z9049 Acquired absence of other specified parts of digestive tract: Secondary | ICD-10-CM | POA: Diagnosis not present

## 2021-02-10 DIAGNOSIS — G4733 Obstructive sleep apnea (adult) (pediatric): Secondary | ICD-10-CM | POA: Diagnosis not present

## 2021-02-10 DIAGNOSIS — R9082 White matter disease, unspecified: Secondary | ICD-10-CM | POA: Diagnosis not present

## 2021-02-10 DIAGNOSIS — G44099 Other trigeminal autonomic cephalgias (TAC), not intractable: Secondary | ICD-10-CM | POA: Diagnosis not present

## 2021-02-10 DIAGNOSIS — H9193 Unspecified hearing loss, bilateral: Secondary | ICD-10-CM | POA: Diagnosis not present

## 2021-02-10 NOTE — Telephone Encounter (Signed)
Per Jimmy Green pt is needing a rx for his Aimvog called in to [facility].

## 2021-02-12 ENCOUNTER — Other Ambulatory Visit: Payer: Self-pay | Admitting: Student

## 2021-02-12 DIAGNOSIS — R519 Headache, unspecified: Secondary | ICD-10-CM

## 2021-02-13 ENCOUNTER — Telehealth: Payer: Self-pay

## 2021-02-13 NOTE — Telephone Encounter (Signed)
Copied from [place] [phone]. Topic: General - Other >> Feb 13, 2021 11:07 AM Tessa Lerner A wrote: Reason for CRM: Patient's wife would like to be conacted by staff member Larene Beach when possible  Patient's wife has additional concerns related to patient's shot for headaches  The patient's wife would like to discuss further

## 2021-02-13 NOTE — Progress Notes (Signed)
Name: Jimmy Green   MRN: [id number]    DOB: Dec 02, 1950   Date:02/14/2021       Progress Note  Subjective  Chief Complaint  Migraines  HPI  Migraine type headache: he developed acute onset of unilateral headache mid March, described as right eye would pop out of the socked, associated with photophobia and phonophobia. Pain is still intense, but no longer daily, about 4 days a week, lasts about 2-3 hours but he needs to sleep and apply pressure over right eye. He states the frequency decreased with Aimovig and would like to get another injection today until medication can be approved by insurance We referred him to Dr. Manuella Ghazi and will have a repeat MRI to find out the cause of his new onset headache  HTN: bp is not at goal for him, we will adjust dose of Benicar HCTZ from 40/12.5 to Benicar HCTZ 40/25 mg, he denies chest pain or palpitation   DM: he has not been checking his glucose lately, A1C back in April was 8.6 % , he has not been compliant with his diet, weight is going up, he is willing to try another oral medication, but not an injectable. He has not been taking Metformin. He has obesity. He will have labs when he returns next month. Discussed possible side effects of Rybelsus. Needs to resume a diabetic diet.   Depression Major Recurrent: symptoms started when he lost his job in 2016, he has been on medications but lately feeling discouraged again, annoyed about all the medication visits, difficulty getting supplies, having lack of motivation. He states not losing his temper, just frustrated. He wants to continue current medications, discussed counseling    Patient Active Problem List   Diagnosis Date Noted   Personal history of colonic polyps    Polyp of transverse colon    Parotid mass 04/17/2020   BPH with obstruction/lower urinary tract symptoms 05/20/2018   Basal cell carcinoma 03/28/2018   History of basal cell carcinoma 03/23/2018   Primary localized osteoarthritis of left  hip 04/06/2017   Dyslipidemia associated with type 2 diabetes mellitus ([place]) 10/04/2016   Trigger thumb of both thumbs 06/02/2016   Chronic tension-type headache, intractable 09/25/2015   Benign paroxysmal positional vertigo due to bilateral vestibular disorder 09/25/2015   Hearing loss sensory, bilateral 08/26/2015   Cerebral microvascular disease 01/31/1600   Metabolic syndrome 09/32/3557   Vertigo 06/21/2015   Buzzing in ear 06/21/2015   Hypertension goal BP (blood pressure) < 140/90 03/28/2015   GERD (gastroesophageal reflux disease) 03/28/2015   Hyperlipidemia 03/28/2015   Depression, major 03/28/2015   CAD in native artery 03/28/2015   Actinic keratosis 02/14/2015   Obstructive sleep apnea 02/03/2015   Diverticulosis of colon without hemorrhage 01/31/2015   Hx of colonic polyps    Benign neoplasm of descending colon    Benign neoplasm of sigmoid colon    Idiopathic colitis    Sebaceous cyst 09/14/2014    Past Surgical History:  Procedure Laterality Date   BACK SURGERY     BASAL CELL CARCINOMA EXCISION Left 12/02/2015   Chest-Done by Dermatologist    BASAL CELL CARCINOMA EXCISION Left 02/21/2018   Nodulocytic pattern, deep margin involved - [facility] - Dr. Brendolyn Patty   BASAL CELL CARCINOMA EXCISION  02/23/2018   CARDIAC CATHETERIZATION Left 04/01/2016   Procedure: Left Heart Cath and Coronary Angiography;  Surgeon: Teodoro Spray, MD;  Location: [place];  Service: Cardiovascular;  Laterality: Left;  CARDIAC CATHETERIZATION N/A 04/01/2016   Procedure: Intravascular Pressure Wire/FFR Study;  Surgeon: Isaias Cowman, MD;  Location: [place] [facility];  Service: Cardiovascular;  Laterality: N/A;   COLONOSCOPY N/A 01/15/2015   Wohl-ileitis, 2 benign polyps, cryptitis, sigmoid diverticulosis, focal ulceration ICV   COLONOSCOPY WITH PROPOFOL N/A 08/29/2020   Procedure: COLONOSCOPY WITH PROPOFOL;  Surgeon: Lucilla Lame, MD;  Location: [facility] ENDOSCOPY;   Service: Endoscopy;  Laterality: N/A;   CORONARY STENT PLACEMENT     FRACTIONAL FLOW RESERVE WIRE  10/08/2011   Procedure: FRACTIONAL FLOW RESERVE WIRE;  Surgeon: Clent Demark, MD;  Location: [facility] [facility];  Service: Cardiovascular;;   HERNIA REPAIR     KNEE SURGERY     LEFT HEART CATHETERIZATION WITH CORONARY ANGIOGRAM N/A 10/08/2011   Procedure: LEFT HEART CATHETERIZATION WITH CORONARY ANGIOGRAM;  Surgeon: Clent Demark, MD;  Location: [facility];  Service: Cardiovascular;  Laterality: N/A;   parodectomy Left 05/07/2020   SKIN CANCER EXCISION  01/2018   [facility]   TOTAL HIP ARTHROPLASTY Left 04/06/2017   Procedure: TOTAL HIP ARTHROPLASTY ANTERIOR APPROACH;  Surgeon: Hessie Knows, MD;  Location: [facility] ORS;  Service: Orthopedics;  Laterality: Left;    Family History  Problem Relation Age of Onset   Lung cancer Father    Heart disease Father    Skin cancer Father     Social History   Tobacco Use   Smoking status: Former    Years: 40.00    Types: Cigarettes    Quit date: 08/03/2012    Years since quitting: 8.5   Smokeless tobacco: Never  Substance Use Topics   Alcohol use: No    Alcohol/week: 0.0 standard drinks     Current Outpatient Medications:    Accu-Chek FastClix Lancets MISC, USE AS INSTRUCTED ONCE DAILY, Disp: 102 each, Rfl: 12   ACCU-CHEK GUIDE test strip, USE AS INSTRUCTED ONCE DAILY, Disp: 100 strip, Rfl: 12   amLODipine (NORVASC) 2.5 MG tablet, Take 1 tablet (2.5 mg total) by mouth daily., Disp: 90 tablet, Rfl: 1   aspirin EC 81 MG tablet, Take 81 mg by mouth every morning. , Disp: , Rfl:    buPROPion (WELLBUTRIN XL) 300 MG 24 hr tablet, Take 1 tablet (300 mg total) by mouth daily. New dose, instead of 3 of 150 mg, Disp: 90 tablet, Rfl: 1   dapagliflozin propanediol (FARXIGA) 10 MG TABS tablet, Take 1 tablet (10 mg total) by mouth daily before breakfast., Disp: 90 tablet, Rfl: 1   escitalopram (LEXAPRO) 20 MG tablet, Take 1 tablet (20 mg total) by mouth  daily., Disp: 90 tablet, Rfl: 1   gabapentin (NEURONTIN) 300 MG capsule, Take 1 capsule (300 mg total) by mouth at bedtime., Disp: 90 capsule, Rfl: 1   metFORMIN (GLUCOPHAGE-XR) 750 MG 24 hr tablet, Take 2 tablets (1,500 mg total) by mouth daily with breakfast., Disp: 180 tablet, Rfl: 0   metoprolol succinate (TOPROL-XL) 50 MG 24 hr tablet, Take 1 tablet (50 mg total) by mouth every evening. Take with or immediately following a meal., Disp: 90 tablet, Rfl: 1   NARCAN 4 MG/0.1ML LIQD nasal spray kit, , Disp: , Rfl: 0   nitroGLYCERIN (NITROSTAT) 0.4 MG SL tablet, DISSOLVE 1 TABLET UNDER THE TONGUE EVERY 5 MINUTES FOR UP TO 3 DOSES AS NEEDED FOR CHEST PAIN. IF NO RELIEF AFTER 3 DOSES, CALL 911 OR GO TO ER., Disp: 25 tablet, Rfl: 0   omeprazole (PRILOSEC) 40 MG capsule, Take 1 capsule (40 mg total)  by mouth daily., Disp: 90 capsule, Rfl: 1   oxyCODONE (ROXICODONE) 15 MG immediate release tablet, Take 1 tablet by mouth 5 (five) times daily as needed., Disp: , Rfl:    PREVIDENT 5000 SENSITIVE 1.1-5 % PSTE, Apply 1 each topically 2 (two) times daily., Disp: , Rfl: 5   rosuvastatin (CRESTOR) 40 MG tablet, Take 1 tablet (40 mg total) by mouth daily., Disp: 90 tablet, Rfl: 3  Allergies  Allergen Reactions   Elavil [Amitriptyline Hcl] Rash   Tape Rash    Paper Tape    I personally reviewed active problem list, medication list, allergies, family history, social history with the patient/caregiver today.   ROS  Ten systems reviewed and is negative except as mentioned in HPI   Objective  Vitals:   02/14/21 0757  BP: 132/86  Pulse: 76  Resp: 16  Temp: 97.9 F (36.6 C)  TempSrc: Oral  SpO2: 97%  Weight: 226 lb (102.5 kg)  Height: '5\' 9"'  (1.753 m)    Body mass index is 33.37 kg/m.  Physical Exam  Constitutional: Patient appears well-developed and well-nourished. Obese  No distress.  HEENT: head atraumatic, normocephalic, pupils equal and reactive to light, neck supple, throat within normal  limits Cardiovascular: Normal rate, regular rhythm and normal heart sounds.  No murmur heard. No BLE edema. Pulmonary/Chest: Effort normal and breath sounds normal. No respiratory distress. Abdominal: Soft.  There is no tenderness. Neurological ; no focal deficit  Psychiatric: Patient has a normal mood and affect. behavior is normal. Judgment and thought content normal.   Recent Results (from the past 2160 hour(s))  HM DIABETES EYE EXAM     Status: None   Collection Time: 12/20/20 12:00 AM  Result Value Ref Range   HM Diabetic Eye Exam No Retinopathy No Retinopathy      PHQ2/9: Depression screen [facility] 2/9 02/14/2021 11/13/2020 10/30/2020 09/12/2020 07/15/2020  Decreased Interest 0 0 0 0 0  Down, Depressed, Hopeless 1 0 0 0 0  PHQ - 2 Score 1 0 0 0 0  Altered sleeping 0 0 0 - -  Tired, decreased energy 0 0 0 - -  Change in appetite 3 0 0 - -  Feeling bad or failure about yourself  0 0 0 - -  Trouble concentrating 1 0 0 - -  Moving slowly or fidgety/restless 0 0 0 - -  Suicidal thoughts 0 0 0 - -  PHQ-9 Score 5 0 0 - -  Difficult doing work/chores Somewhat difficult - - - -  Some recent data might be hidden    phq 9 is positive   Fall Risk: Fall Risk  02/14/2021 11/13/2020 10/30/2020 09/12/2020 07/15/2020  Falls in the past year? 0 0 0 0 0  Number falls in past yr: 0 - 0 0 0  Injury with Fall? 0 - 0 0 0  Risk for fall due to : - - - No Fall Risks -  Follow up - Falls prevention discussed - Falls prevention discussed -     Functional Status Survey: Is the patient deaf or have difficulty hearing?: No Does the patient have difficulty seeing, even when wearing glasses/contacts?: No Does the patient have difficulty concentrating, remembering, or making decisions?: No Does the patient have difficulty walking or climbing stairs?: No Does the patient have difficulty dressing or bathing?: No Does the patient have difficulty doing errands alone such as visiting a doctor's office or  shopping?: No   Assessment & Plan  1. Hypertension, benign  -  olmesartan-hydrochlorothiazide (BENICAR HCT) 40-25 MG tablet; Take 1 tablet by mouth daily.  Dispense: 90 tablet; Refill: 1  2. Hypertension associated with diabetes ([place])  - olmesartan-hydrochlorothiazide (BENICAR HCT) 40-25 MG tablet; Take 1 tablet by mouth daily.  Dispense: 90 tablet; Refill: 1  3. Intractable migraine without aura and without status migrainosus  - Erenumab-aooe SOAJ 140 mg  4. Dyslipidemia associated with type 2 diabetes mellitus (HCC)  - Semaglutide (RYBELSUS) 3 MG TABS; Take 3 mg by mouth daily.  Dispense: 30 tablet; Refill: 0 - metFORMIN (GLUCOPHAGE-XR) 750 MG 24 hr tablet; Take 2 tablets (1,500 mg total) by mouth daily with breakfast.  Dispense: 180 tablet; Refill: 0  5. Diabetes mellitus type 2 in obese (HCC)  - Semaglutide (RYBELSUS) 3 MG TABS; Take 3 mg by mouth daily.  Dispense: 30 tablet; Refill: 0 - metFORMIN (GLUCOPHAGE-XR) 750 MG 24 hr tablet; Take 2 tablets (1,500 mg total) by mouth daily with breakfast.  Dispense: 180 tablet; Refill: 0

## 2021-02-14 ENCOUNTER — Encounter: Payer: Self-pay | Admitting: Family Medicine

## 2021-02-14 ENCOUNTER — Ambulatory Visit (INDEPENDENT_AMBULATORY_CARE_PROVIDER_SITE_OTHER): Payer: Medicare Other | Admitting: Family Medicine

## 2021-02-14 ENCOUNTER — Other Ambulatory Visit: Payer: Self-pay

## 2021-02-14 VITALS — BP 132/86 | HR 76 | Temp 97.9°F | Resp 16 | Ht 69.0 in | Wt 226.0 lb

## 2021-02-14 DIAGNOSIS — E1159 Type 2 diabetes mellitus with other circulatory complications: Secondary | ICD-10-CM | POA: Diagnosis not present

## 2021-02-14 DIAGNOSIS — E1169 Type 2 diabetes mellitus with other specified complication: Secondary | ICD-10-CM

## 2021-02-14 DIAGNOSIS — I10 Essential (primary) hypertension: Secondary | ICD-10-CM | POA: Diagnosis not present

## 2021-02-14 DIAGNOSIS — G43019 Migraine without aura, intractable, without status migrainosus: Secondary | ICD-10-CM

## 2021-02-14 DIAGNOSIS — E669 Obesity, unspecified: Secondary | ICD-10-CM

## 2021-02-14 DIAGNOSIS — E785 Hyperlipidemia, unspecified: Secondary | ICD-10-CM | POA: Diagnosis not present

## 2021-02-14 DIAGNOSIS — I152 Hypertension secondary to endocrine disorders: Secondary | ICD-10-CM

## 2021-02-14 MED ORDER — ERENUMAB-AOOE 140 MG/ML ~~LOC~~ SOAJ
140.0000 mg | Freq: Once | SUBCUTANEOUS | Status: AC
  Administered 2021-02-14: 140 mg via SUBCUTANEOUS

## 2021-02-14 MED ORDER — OLMESARTAN MEDOXOMIL-HCTZ 40-25 MG PO TABS: 1.0000 | ORAL_TABLET | Freq: Every day | ORAL | 1 refills | Status: DC

## 2021-02-14 MED ORDER — METFORMIN HCL ER 750 MG PO TB24: 1500.0000 mg | ORAL_TABLET | Freq: Every day | ORAL | 0 refills | Status: DC

## 2021-02-14 MED ORDER — RYBELSUS 3 MG PO TABS: 3.0000 mg | ORAL_TABLET | Freq: Every day | ORAL | 0 refills | Status: AC

## 2021-02-14 MED ORDER — AIMOVIG 140 MG/ML ~~LOC~~ SOAJ
1.0000 | SUBCUTANEOUS | 5 refills | Status: DC
Start: 2021-02-14 — End: 2021-08-12

## 2021-02-14 NOTE — Patient Instructions (Signed)
Take Rybelsus ( new medication for diabetes ) in the mornings - before breakfast  Continue farxiga also in the mornings Metformin at night   Changed dose of Benicar 40/12.5 to Benicar 40/25 for better bp control

## 2021-02-18 DIAGNOSIS — M545 Low back pain, unspecified: Secondary | ICD-10-CM | POA: Diagnosis not present

## 2021-02-18 DIAGNOSIS — M169 Osteoarthritis of hip, unspecified: Secondary | ICD-10-CM | POA: Diagnosis not present

## 2021-02-18 DIAGNOSIS — G894 Chronic pain syndrome: Secondary | ICD-10-CM | POA: Diagnosis not present

## 2021-02-18 DIAGNOSIS — M5136 Other intervertebral disc degeneration, lumbar region: Secondary | ICD-10-CM | POA: Diagnosis not present

## 2021-02-20 ENCOUNTER — Ambulatory Visit
Admission: RE | Admit: 2021-02-20 | Discharge: 2021-02-20 | Disposition: A | Discharge status: Home / Self Care | Payer: Medicare Other | Source: Ambulatory Visit | Attending: Student | Admitting: Student

## 2021-02-20 ENCOUNTER — Other Ambulatory Visit: Payer: Self-pay

## 2021-02-20 DIAGNOSIS — H53131 Sudden visual loss, right eye: Secondary | ICD-10-CM | POA: Insufficient documentation

## 2021-02-20 DIAGNOSIS — R42 Dizziness and giddiness: Secondary | ICD-10-CM | POA: Insufficient documentation

## 2021-02-20 DIAGNOSIS — R519 Headache, unspecified: Secondary | ICD-10-CM

## 2021-02-20 DIAGNOSIS — G4452 New daily persistent headache (NDPH): Secondary | ICD-10-CM | POA: Insufficient documentation

## 2021-02-20 MED ORDER — GADOBUTROL 1 MMOL/ML IV SOLN
10.0000 mL | Freq: Once | INTRAVENOUS | Status: AC | PRN
  Administered 2021-02-20: 10 mL via INTRAVENOUS

## 2021-02-24 ENCOUNTER — Ambulatory Visit (INDEPENDENT_AMBULATORY_CARE_PROVIDER_SITE_OTHER): Payer: Medicare Other

## 2021-02-24 ENCOUNTER — Other Ambulatory Visit: Payer: Self-pay

## 2021-02-24 DIAGNOSIS — E538 Deficiency of other specified B group vitamins: Secondary | ICD-10-CM | POA: Diagnosis not present

## 2021-02-24 MED ORDER — CYANOCOBALAMIN 1000 MCG/ML IJ SOLN
1000.0000 ug | Freq: Once | INTRAMUSCULAR | Status: AC
  Administered 2021-02-24: 1000 ug via INTRAMUSCULAR

## 2021-02-26 DIAGNOSIS — I82C12 Acute embolism and thrombosis of left internal jugular vein: Secondary | ICD-10-CM | POA: Diagnosis not present

## 2021-02-26 DIAGNOSIS — Z9049 Acquired absence of other specified parts of digestive tract: Secondary | ICD-10-CM | POA: Diagnosis not present

## 2021-02-26 DIAGNOSIS — H9193 Unspecified hearing loss, bilateral: Secondary | ICD-10-CM | POA: Diagnosis not present

## 2021-02-26 DIAGNOSIS — G4733 Obstructive sleep apnea (adult) (pediatric): Secondary | ICD-10-CM | POA: Diagnosis not present

## 2021-02-26 DIAGNOSIS — R519 Headache, unspecified: Secondary | ICD-10-CM | POA: Diagnosis not present

## 2021-02-26 DIAGNOSIS — R9082 White matter disease, unspecified: Secondary | ICD-10-CM | POA: Diagnosis not present

## 2021-03-03 ENCOUNTER — Encounter (INDEPENDENT_AMBULATORY_CARE_PROVIDER_SITE_OTHER): Payer: Self-pay | Admitting: Vascular Surgery

## 2021-03-03 ENCOUNTER — Ambulatory Visit (INDEPENDENT_AMBULATORY_CARE_PROVIDER_SITE_OTHER): Payer: Medicare Other | Admitting: Vascular Surgery

## 2021-03-03 ENCOUNTER — Other Ambulatory Visit: Payer: Self-pay

## 2021-03-03 VITALS — BP 143/80 | HR 66 | Resp 16 | Ht 66.5 in | Wt 226.0 lb

## 2021-03-03 DIAGNOSIS — E1159 Type 2 diabetes mellitus with other circulatory complications: Secondary | ICD-10-CM

## 2021-03-03 DIAGNOSIS — I82409 Acute embolism and thrombosis of unspecified deep veins of unspecified lower extremity: Secondary | ICD-10-CM | POA: Insufficient documentation

## 2021-03-03 DIAGNOSIS — E782 Mixed hyperlipidemia: Secondary | ICD-10-CM | POA: Diagnosis not present

## 2021-03-03 DIAGNOSIS — I2510 Atherosclerotic heart disease of native coronary artery without angina pectoris: Secondary | ICD-10-CM

## 2021-03-03 DIAGNOSIS — Z86718 Personal history of other venous thrombosis and embolism: Secondary | ICD-10-CM | POA: Insufficient documentation

## 2021-03-03 DIAGNOSIS — E119 Type 2 diabetes mellitus without complications: Secondary | ICD-10-CM | POA: Insufficient documentation

## 2021-03-03 DIAGNOSIS — I8290 Acute embolism and thrombosis of unspecified vein: Secondary | ICD-10-CM

## 2021-03-03 DIAGNOSIS — I10 Essential (primary) hypertension: Secondary | ICD-10-CM

## 2021-03-03 MED ORDER — APIXABAN (ELIQUIS) VTE STARTER PACK (10MG AND 5MG): ORAL_TABLET | ORAL | 0 refills | Status: DC

## 2021-03-03 MED ORDER — APIXABAN 5 MG PO TABS: 5.0000 mg | ORAL_TABLET | Freq: Two times a day (BID) | ORAL | 3 refills | Status: DC

## 2021-03-03 NOTE — Progress Notes (Signed)
MRN : [id number]  Jimmy Green is a 70 y.o. ([birth year]) male who presents with chief complaint of  Chief Complaint  Patient presents with   New Patient (Initial Visit)  .  History of Present Illness:   I am asked to evaluate Jimmy Green for left IJ DVT identified by a recent MRV.  I have been asked to see him by Dr. Manuella Ghazi of neurology.  Patient is a 70 year old gentleman with a significant history of head neck surgery on the left side in October 2021.  This was performed at [facility] and the op note is listed in care everywhere but is not available for download.  Patient is being worked up by Dr. Manuella Ghazi for recalcitrant headaches.  Per the patient and his wife he has not noted any unusual swelling of the face or left arm.  He still has some numbness and dysesthesias that are been present since his surgery on the left side but no new acute onset pain.  There is no past history of DVT.  MRV is reviewed with the patient there is a filling defect in the left IJ suggestive of thrombus.  Current Meds  Medication Sig   Accu-Chek FastClix Lancets MISC USE AS INSTRUCTED ONCE DAILY   ACCU-CHEK GUIDE test strip USE AS INSTRUCTED ONCE DAILY   amLODipine (NORVASC) 2.5 MG tablet Take 1 tablet (2.5 mg total) by mouth daily.   aspirin EC 81 MG tablet Take 81 mg by mouth every morning.    buPROPion (WELLBUTRIN XL) 300 MG 24 hr tablet Take 1 tablet (300 mg total) by mouth daily. New dose, instead of 3 of 150 mg   dapagliflozin propanediol (FARXIGA) 10 MG TABS tablet Take 1 tablet (10 mg total) by mouth daily before breakfast.   Erenumab-aooe (AIMOVIG) 140 MG/ML SOAJ Inject 140 mg into the skin every 30 (thirty) days.   escitalopram (LEXAPRO) 20 MG tablet Take 1 tablet (20 mg total) by mouth daily.   gabapentin (NEURONTIN) 300 MG capsule Take 1 capsule (300 mg total) by mouth at bedtime.   metFORMIN (GLUCOPHAGE-XR) 750 MG 24 hr tablet Take 2 tablets (1,500 mg total) by mouth daily with breakfast.    metoprolol succinate (TOPROL-XL) 50 MG 24 hr tablet Take 1 tablet (50 mg total) by mouth every evening. Take with or immediately following a meal.   NARCAN 4 MG/0.1ML LIQD nasal spray kit    nitroGLYCERIN (NITROSTAT) 0.4 MG SL tablet DISSOLVE 1 TABLET UNDER THE TONGUE EVERY 5 MINUTES FOR UP TO 3 DOSES AS NEEDED FOR CHEST PAIN. IF NO RELIEF AFTER 3 DOSES, CALL 911 OR GO TO ER.   olmesartan-hydrochlorothiazide (BENICAR HCT) 40-25 MG tablet Take 1 tablet by mouth daily.   omeprazole (PRILOSEC) 40 MG capsule Take 1 capsule (40 mg total) by mouth daily.   oxyCODONE (ROXICODONE) 15 MG immediate release tablet Take 1 tablet by mouth 5 (five) times daily as needed.   rosuvastatin (CRESTOR) 40 MG tablet Take 1 tablet (40 mg total) by mouth daily.   Semaglutide (RYBELSUS) 3 MG TABS Take 3 mg by mouth daily.    Past Medical History:  Diagnosis Date   Anginal pain ([place])    Basal cell carcinoma 02/21/2018   left posterior shoulder at lat edge of scar   Coronary artery disease    Depression    Diabetes mellitus without complication (HCC)    GERD (gastroesophageal reflux disease)    Hyperlipidemia    Hypertension    Hypogonadism in male  Metabolic syndrome    Myocardial infarction ([place])    Orthopnea    Seborrheic keratosis    Skin cancer 2014   nose and right knee   Sleep apnea     Past Surgical History:  Procedure Laterality Date   BACK SURGERY     BASAL CELL CARCINOMA EXCISION Left 12/02/2015   Chest-Done by Dermatologist    BASAL CELL CARCINOMA EXCISION Left 02/21/2018   Nodulocytic pattern, deep margin involved - [facility] - Dr. Brendolyn Patty   BASAL CELL CARCINOMA EXCISION  02/23/2018   CARDIAC CATHETERIZATION Left 04/01/2016   Procedure: Left Heart Cath and Coronary Angiography;  Surgeon: Teodoro Spray, MD;  Location: [place] [facility];  Service: Cardiovascular;  Laterality: Left;   CARDIAC CATHETERIZATION N/A 04/01/2016   Procedure: Intravascular Pressure Wire/FFR  Study;  Surgeon: Isaias Cowman, MD;  Location: [place] [facility];  Service: Cardiovascular;  Laterality: N/A;   COLONOSCOPY N/A 01/15/2015   Wohl-ileitis, 2 benign polyps, cryptitis, sigmoid diverticulosis, focal ulceration ICV   COLONOSCOPY WITH PROPOFOL N/A 08/29/2020   Procedure: COLONOSCOPY WITH PROPOFOL;  Surgeon: Lucilla Lame, MD;  Location: [facility] ENDOSCOPY;  Service: Endoscopy;  Laterality: N/A;   CORONARY STENT PLACEMENT     FRACTIONAL FLOW RESERVE WIRE  10/08/2011   Procedure: FRACTIONAL FLOW RESERVE WIRE;  Surgeon: Clent Demark, MD;  Location: [place] CATH LAB;  Service: Cardiovascular;;   HERNIA REPAIR     KNEE SURGERY     LEFT HEART CATHETERIZATION WITH CORONARY ANGIOGRAM N/A 10/08/2011   Procedure: LEFT HEART CATHETERIZATION WITH CORONARY ANGIOGRAM;  Surgeon: Clent Demark, MD;  Location: [facility] LAB;  Service: Cardiovascular;  Laterality: N/A;   parodectomy Left 05/07/2020   SKIN CANCER EXCISION  01/2018   [facility]   TOTAL HIP ARTHROPLASTY Left 04/06/2017   Procedure: TOTAL HIP ARTHROPLASTY ANTERIOR APPROACH;  Surgeon: Hessie Knows, MD;  Location: [facility] ORS;  Service: Orthopedics;  Laterality: Left;    Social History Social History   Tobacco Use   Smoking status: Former    Years: 40.00    Types: Cigarettes    Quit date: 08/03/2012    Years since quitting: 8.5   Smokeless tobacco: Never  Vaping Use   Vaping Use: Never used  Substance Use Topics   Alcohol use: No    Alcohol/week: 0.0 standard drinks   Drug use: No    Family History Family History  Problem Relation Age of Onset   Lung cancer Father    Heart disease Father    Skin cancer Father     Allergies  Allergen Reactions   Elavil [Amitriptyline Hcl] Rash   Tape Rash    Paper Tape     REVIEW OF SYSTEMS (Negative unless checked)  Constitutional: '[]' Weight loss  '[]' Fever  '[]' Chills Cardiac: '[]' Chest pain   '[]' Chest pressure   '[]' Palpitations   '[]' Shortness of breath when laying flat    '[]' Shortness of breath with exertion. Vascular:  '[]' Pain in legs with walking   '[]' Pain in legs at rest  '[]' History of DVT   '[]' Phlebitis   '[]' Swelling in legs   '[]' Varicose veins   '[]' Non-healing ulcers Pulmonary:   '[]' Uses home oxygen   '[]' Productive cough   '[]' Hemoptysis   '[]' Wheeze  '[]' COPD   '[]' Asthma Neurologic:  '[]' Dizziness   '[]' Seizures   '[]' History of stroke   '[]' History of TIA  '[]' Aphasia   '[]' Vissual changes   '[]' Weakness or numbness in arm   '[]' Weakness or numbness in leg Musculoskeletal:   '[]' Joint  swelling   '[]' Joint pain   '[]' Low back pain Hematologic:  '[]' Easy bruising  '[]' Easy bleeding   '[]' Hypercoagulable state   '[]' Anemic Gastrointestinal:  '[]' Diarrhea   '[]' Vomiting  '[]' Gastroesophageal reflux/heartburn   '[]' Difficulty swallowing. Genitourinary:  '[]' Chronic kidney disease   '[]' Difficult urination  '[]' Frequent urination   '[]' Blood in urine Skin:  '[]' Rashes   '[]' Ulcers  Psychological:  '[]' History of anxiety   '[]'  History of major depression.  Physical Examination  Vitals:   03/03/21 0939  BP: (!) 143/80  Pulse: 66  Resp: 16  Weight: 226 lb (102.5 kg)  Height: 5' 6.5" (1.689 m)   Body mass index is 35.93 kg/m. Gen: WD/WN, NAD Head: [place]/AT, No temporalis wasting.  Face is symmetric no unilateral swelling Ear/Nose/Throat: Hearing grossly intact, nares w/o erythema or drainage Eyes: PER, EOMI, sclera nonicteric.  Neck: Supple, no large masses.   Pulmonary:  Good air movement, no audible wheezing bilaterally, no use of accessory muscles.  Cardiac: RRR, no JVD Vascular:  Vessel Right Left  Radial Palpable Palpable  Carotid Palpable Palpable  Gastrointestinal: Non-distended. No guarding/no peritoneal signs.  Musculoskeletal: M/S 5/5 throughout.  No deformity or atrophy.  Neurologic: CN 2-12 intact. Symmetrical.  Speech is fluent. Motor exam as listed above. Psychiatric: Judgment intact, Mood & affect appropriate for pt's clinical situation.   CBC Lab Results  Component Value Date   WBC 6.8 11/13/2020    HGB 15.7 11/13/2020   HCT 47.1 11/13/2020   MCV 90.6 11/13/2020   PLT 264 11/13/2020    BMET    Component Value Date/Time   NA 138 11/13/2020 0000   NA 140 06/07/2015 0935   K 4.3 11/13/2020 0000   CL 101 11/13/2020 0000   CO2 29 11/13/2020 0000   GLUCOSE 216 (H) 11/13/2020 0000   BUN 10 11/13/2020 0000   BUN 8 06/07/2015 0935   CREATININE 0.60 (L) 11/13/2020 0000   CALCIUM 9.5 11/13/2020 0000   GFRNONAA 103 11/13/2020 0000   GFRAA 120 11/13/2020 0000   CrCl cannot be calculated (Patient's most recent lab result is older than the maximum 21 days allowed.).  COAG Lab Results  Component Value Date   INR 1.15 03/31/2017   INR 1.07 10/01/2011   INR 1.2 02/25/2008    Radiology MR MRV HEAD W WO CONTRAST  Result Date: 02/20/2021 CLINICAL DATA:  Daily headaches for 6 months. Light and sound sensitivity. Dizziness and right eye blurry vision. EXAM: MR VENOGRAM HEAD WITHOUT AND WITH CONTRAST TECHNIQUE: Angiographic images of the intracranial venous structures were acquired using MRV technique without and with intravenous contrast. CONTRAST:  26m GADAVIST GADOBUTROL 1 MMOL/ML IV SOLN COMPARISON:  No pertinent prior exam. FINDINGS: Superior sagittal sinus: Normal. Straight sinus: Normal. Inferior sagittal sinus, vein of [place] and internal cerebral veins: Normal. Transverse sinuses: Normal. Sigmoid sinuses: Normal. Visualized jugular veins: Filling defect within the left internal jugular vein (12:124). IMPRESSION: 1. No dural venous sinus thrombosis. 2. Filling defect within the left internal jugular vein, consistent with thrombus. Confirmation with CT venogram may be helpful. Electronically Signed   By: KUlyses JarredM.D.   On: 02/20/2021 21:17     Assessment/Plan 1. Acute deep vein thrombosis (DVT) of non-extremity vein Recommend:   No surgery or intervention at this point in time.  IVC filter is not indicated at present.  Patient's MRV of the venous system shows DVT in the left IJ  vein.  I will initiate the patient on Eliquis therapy and this was reviewed with the patient  and his wife.  We will plan for a 85-monthcourse with a follow-up duplex ultrasound.  The patient will begin anticoagulation he is concerned about the blood tests that have been ordered historically there is no history of thrombocytopenia and labs from several months ago showed normal platelet count.  Likewise there is no history of severe renal dysfunction labs from a few months ago showed normal function.  Therefore I do not feel compelled to revisit these labs for a short course of anticoagulation.  Also, I have asked that he continue his baby aspirin on a daily basis as he does have a coronary stent and again we are planning for a short 364-monthourse of anticoagulation.  - VAS US[place]PPER EXTREMITY VENOUS DUPLEX; Future  2. CAD in native artery Continue cardiac and antihypertensive medications as already ordered and reviewed, no changes at this time.  Continue statin as ordered and reviewed, no changes at this time  Nitrates PRN for chest pain   3. Hypertension goal BP (blood pressure) < 140/90 Continue antihypertensive medications as already ordered, these medications have been reviewed and there are no changes at this time.   4. Type 2 diabetes mellitus with other circulatory complication, unspecified whether long term insulin use (HCC) Continue hypoglycemic medications as already ordered, these medications have been reviewed and there are no changes at this time.  Hgb A1C to be monitored as already arranged by primary service   5. Mixed hyperlipidemia Continue statin as ordered and reviewed, no changes at this time    GrHortencia PilarMD  03/03/2021 10:09 AM

## 2021-03-05 ENCOUNTER — Other Ambulatory Visit
Admission: RE | Admit: 2021-03-05 | Discharge: 2021-03-05 | Disposition: A | Discharge status: Home / Self Care | Payer: Medicare Other | Source: Ambulatory Visit | Attending: Neurology | Admitting: Neurology

## 2021-03-05 DIAGNOSIS — I82C12 Acute embolism and thrombosis of left internal jugular vein: Secondary | ICD-10-CM | POA: Diagnosis not present

## 2021-03-05 LAB — APTT: aPTT: 35 seconds (ref 24–36)

## 2021-03-07 DIAGNOSIS — I82C12 Acute embolism and thrombosis of left internal jugular vein: Secondary | ICD-10-CM | POA: Diagnosis not present

## 2021-03-11 ENCOUNTER — Encounter: Payer: Self-pay | Admitting: Dermatology

## 2021-03-11 ENCOUNTER — Ambulatory Visit (INDEPENDENT_AMBULATORY_CARE_PROVIDER_SITE_OTHER): Payer: Medicare Other | Admitting: Dermatology

## 2021-03-11 ENCOUNTER — Other Ambulatory Visit: Payer: Self-pay

## 2021-03-11 DIAGNOSIS — Z85828 Personal history of other malignant neoplasm of skin: Secondary | ICD-10-CM

## 2021-03-11 DIAGNOSIS — B079 Viral wart, unspecified: Secondary | ICD-10-CM

## 2021-03-11 DIAGNOSIS — L711 Rhinophyma: Secondary | ICD-10-CM

## 2021-03-11 DIAGNOSIS — C44519 Basal cell carcinoma of skin of other part of trunk: Secondary | ICD-10-CM

## 2021-03-11 DIAGNOSIS — L821 Other seborrheic keratosis: Secondary | ICD-10-CM | POA: Diagnosis not present

## 2021-03-11 DIAGNOSIS — D492 Neoplasm of unspecified behavior of bone, soft tissue, and skin: Secondary | ICD-10-CM

## 2021-03-11 DIAGNOSIS — L814 Other melanin hyperpigmentation: Secondary | ICD-10-CM

## 2021-03-11 DIAGNOSIS — L719 Rosacea, unspecified: Secondary | ICD-10-CM

## 2021-03-11 DIAGNOSIS — Z1283 Encounter for screening for malignant neoplasm of skin: Secondary | ICD-10-CM

## 2021-03-11 DIAGNOSIS — C4491 Basal cell carcinoma of skin, unspecified: Secondary | ICD-10-CM

## 2021-03-11 DIAGNOSIS — L570 Actinic keratosis: Secondary | ICD-10-CM

## 2021-03-11 DIAGNOSIS — B078 Other viral warts: Secondary | ICD-10-CM | POA: Diagnosis not present

## 2021-03-11 DIAGNOSIS — D1800 Hemangioma unspecified site: Secondary | ICD-10-CM

## 2021-03-11 DIAGNOSIS — L578 Other skin changes due to chronic exposure to nonionizing radiation: Secondary | ICD-10-CM

## 2021-03-11 DIAGNOSIS — D229 Melanocytic nevi, unspecified: Secondary | ICD-10-CM

## 2021-03-11 DIAGNOSIS — L700 Acne vulgaris: Secondary | ICD-10-CM | POA: Diagnosis not present

## 2021-03-11 DIAGNOSIS — L720 Epidermal cyst: Secondary | ICD-10-CM | POA: Diagnosis not present

## 2021-03-11 HISTORY — DX: Basal cell carcinoma of skin, unspecified: C44.91

## 2021-03-11 MED ORDER — TRETINOIN 0.025 % EX CREA: TOPICAL_CREAM | CUTANEOUS | 0 refills | Status: DC

## 2021-03-11 NOTE — Progress Notes (Signed)
Follow-Up Visit   Subjective  Jimmy Green is a 70 y.o. male who presents for the following: Annual Exam (Mole check, hx of BCC ).   The patient presents for Upper Body Skin Exam (UBSE) for skin cancer screening and mole check.  Pt concerned about several places on his back and neck growing and not going away.   One spot at neck is painful.   The following portions of the chart were reviewed this encounter and updated as appropriate:   Tobacco  Allergies  Meds  Problems  Med Hx  Surg Hx  Fam Hx      Review of Systems:  No other skin or systemic complaints except as noted in HPI or Assessment and Plan.  Objective  Well appearing patient in no apparent distress; mood and affect are within normal limits.  All skin waist up examined.  left preauricular Erythematous thin papules/macules with gritty scale.   Nose Rhinophymatous changes at nose  face Trace open comedones with a rare smooth white papule  left anterior neck 0.4 cm erythematous papule        Left Upper Back medial 0.9 cm pink papule        Left Upper Back lateral 0.5 cm pink papule        right Mid Back 0.7 cm pink papule        Left Upper Back Verrucous papules -- Discussed viral etiology and contagion.    Assessment & Plan  AK (actinic keratosis) left preauricular  Prior to procedure, discussed risks of blister formation, small wound, skin dyspigmentation, or rare scar following cryotherapy. Recommend Vaseline ointment to treated areas while healing.  Destruction of lesion - left preauricular Complexity: simple   Destruction method: cryotherapy   Informed consent: discussed and consent obtained   Timeout:  patient name, date of birth, surgical site, and procedure verified Lesion destroyed using liquid nitrogen: Yes   Region frozen until ice ball extended beyond lesion: Yes   Outcome: patient tolerated procedure well with no complications   Post-procedure details:  wound care instructions given    Rosacea Nose  Rosacea- Rhinophymatous type  Rosacea is a chronic progressive skin condition, can enlarge the nose, usually affecting the face of adults, causing redness and/or acne bumps. It is treatable but not curable. It sometimes affects the eyes (ocular rosacea) as well. It may respond to topical and/or systemic medication and can flare with stress, sun exposure, alcohol, exercise and some foods.  Daily application of broad spectrum spf 30+ sunscreen to face is recommended to reduce flares.   Discussed treatment option Doxycycline 20 mg or Isotretinoin to prevent progressive enlargement/changes of nose. Pt declines treatment today.  Acne vulgaris face  Acne with Milia   Chronic condition with duration or expected duration over one year. Condition is bothersome to patient. Currently flared.  Start Retin A 025% cream apply a thin film to face at bedtime  Topical retinoid medications like tretinoin/Retin-A, adapalene/Differin, tazarotene/Fabior, and Epiduo/Epiduo Forte can cause dryness and irritation when first started. Only apply a pea-sized amount to the entire affected area. Avoid applying it around the eyes, edges of mouth and creases at the nose. If you experience irritation, use a good moisturizer first and/or apply the medicine less often. If you are doing well with the medicine, you can increase how often you use it until you are applying every night. Be careful with sun protection while using this medication as it can make you sensitive to the sun.  This medicine should not be used by pregnant women.    Related Medications tretinoin (RETIN-A) 0.025 % cream Apply a thin film to face at bedtime  Neoplasm of skin (4) left anterior neck  Epidermal / dermal shaving  Lesion diameter (cm):  0.4 Informed consent: discussed and consent obtained   Anesthesia: the lesion was anesthetized in a standard fashion   Anesthetic:  0.5% bupivicaine w/  epinephrine 1-100,000 nerve block Instrument used: scissors   Hemostasis achieved with: pressure, aluminum chloride and electrodesiccation   Outcome: patient tolerated procedure well   Post-procedure details: wound care instructions given    Specimen 1 - Surgical pathology Differential Diagnosis: R/O Traumatized Skin tag vs other   Check Margins: No  Left Upper Back medial  Skin / nail biopsy Type of biopsy: tangential   Informed consent: discussed and consent obtained   Patient was prepped and draped in usual sterile fashion: Area prepped with alcohol. Anesthesia: the lesion was anesthetized in a standard fashion   Anesthetic:  1% lidocaine w/ epinephrine 1-100,000 buffered w/ 8.4% NaHCO3 Instrument used: flexible razor blade   Hemostasis achieved with: pressure, aluminum chloride and electrodesiccation   Outcome: patient tolerated procedure well   Post-procedure details: wound care instructions given   Post-procedure details comment:  Ointment and small bandage applied  Specimen 2 - Surgical pathology Differential Diagnosis: R/O BCC   Check Margins: No  Left Upper Back lateral  Skin / nail biopsy Type of biopsy: tangential   Informed consent: discussed and consent obtained   Patient was prepped and draped in usual sterile fashion: Area prepped with alcohol. Anesthesia: the lesion was anesthetized in a standard fashion   Anesthetic:  1% lidocaine w/ epinephrine 1-100,000 buffered w/ 8.4% NaHCO3 Instrument used: flexible razor blade   Hemostasis achieved with: pressure, aluminum chloride and electrodesiccation   Outcome: patient tolerated procedure well   Post-procedure details: wound care instructions given   Post-procedure details comment:  Ointment and small bandage applied  Specimen 3 - Surgical pathology Differential Diagnosis: R/O BCC  Check Margins: No  right Mid Back  Skin / nail biopsy Type of biopsy: tangential   Informed consent: discussed and consent  obtained   Patient was prepped and draped in usual sterile fashion: Area prepped with alcohol. Anesthesia: the lesion was anesthetized in a standard fashion   Anesthetic:  1% lidocaine w/ epinephrine 1-100,000 buffered w/ 8.4% NaHCO3 Instrument used: flexible razor blade   Hemostasis achieved with: pressure, aluminum chloride and electrodesiccation   Outcome: patient tolerated procedure well   Post-procedure details: wound care instructions given   Post-procedure details comment:  Ointment and small bandage applied  Specimen 4 - Surgical pathology Differential Diagnosis: R/O BCC   Check Margins: No  R/O trauma skin tag vs other   Other viral warts Left Upper Back  Prior to procedure, discussed risks of blister formation, small wound, skin dyspigmentation, or rare scar following cryotherapy. Recommend Vaseline ointment to treated areas while healing.  Discussed viral etiology and risk of spread.  Discussed multiple treatments may be required to clear warts.  Discussed possible post-treatment dyspigmentation and risk of recurrence.   Destruction of lesion - Left Upper Back Complexity: simple   Destruction method: cryotherapy   Informed consent: discussed and consent obtained   Timeout:  patient name, date of birth, surgical site, and procedure verified Lesion destroyed using liquid nitrogen: Yes   Region frozen until ice ball extended beyond lesion: Yes   Outcome: patient tolerated procedure well with no  complications   Post-procedure details: wound care instructions given    Lentigines - Scattered tan macules - Due to sun exposure - Benign-appering, observe - Recommend daily broad spectrum sunscreen SPF 30+ to sun-exposed areas, reapply every 2 hours as needed. - Call for any changes  Seborrheic Keratoses - Stuck-on, waxy, tan-brown papules and/or plaques  - Benign-appearing - Discussed benign etiology and prognosis. - Observe - Call for any changes  Melanocytic Nevi -  Tan-brown and/or pink-flesh-colored symmetric macules and papules - Benign appearing on exam today - Observation - Call clinic for new or changing moles - Recommend daily use of broad spectrum spf 30+ sunscreen to sun-exposed areas.   Hemangiomas - Red papules - Discussed benign nature - Observe - Call for any changes  Actinic Damage - Chronic condition, secondary to cumulative UV/sun exposure - diffuse scaly erythematous macules with underlying dyspigmentation - Recommend daily broad spectrum sunscreen SPF 30+ to sun-exposed areas, reapply every 2 hours as needed.  - Staying in the shade or wearing long sleeves, sun glasses (UVA+UVB protection) and wide brim hats (4-inch brim around the entire circumference of the hat) are also recommended for sun protection.  - Call for new or changing lesions.  History of Basal Cell Carcinoma of the Skin Left posterior shoulder-2019 - No evidence of recurrence today - Recommend regular full body skin exams - Recommend daily broad spectrum sunscreen SPF 30+ to sun-exposed areas, reapply every 2 hours as needed.  - Call if any new or changing lesions are noted between office visits  Skin cancer screening performed today.   Return in about 3 months (around 06/11/2021) for AKs, lower body check, wart  .  I, Marye Round, CMA, am acting as scribe for Forest Gleason, MD .   Documentation: I have reviewed the above documentation for accuracy and completeness, and I agree with the above.  Forest Gleason, MD

## 2021-03-11 NOTE — Patient Instructions (Addendum)
Topical retinoid medications like tretinoin/Retin-A, adapalene/Differin, tazarotene/Fabior, and Epiduo/Epiduo Forte can cause dryness and irritation when first started. Only apply a pea-sized amount to the entire affected area. Avoid applying it around the eyes, edges of mouth and creases at the nose. If you experience irritation, use a good moisturizer first and/or apply the medicine less often. If you are doing well with the medicine, you can increase how often you use it until you are applying every night. Be careful with sun protection while using this medication as it can make you sensitive to the sun. This medicine should not be used by pregnant women.      Wound Care Instructions  Cleanse wound gently with soap and water once a day then pat dry with clean gauze. Apply a thing coat of Petrolatum (petroleum jelly, "Vaseline") over the wound (unless you have an allergy to this). We recommend that you use a new, sterile tube of Vaseline. Do not pick or remove scabs. Do not remove the yellow or white "healing tissue" from the base of the wound.  Cover the wound with fresh, clean, nonstick gauze and secure with paper tape. You may use Band-Aids in place of gauze and tape if the would is small enough, but would recommend trimming much of the tape off as there is often too much. Sometimes Band-Aids can irritate the skin.  You should call the office for your biopsy report after 1 week if you have not already been contacted.  If you experience any problems, such as abnormal amounts of bleeding, swelling, significant bruising, significant pain, or evidence of infection, please call the office immediately.  FOR ADULT SURGERY PATIENTS: If you need something for pain relief you may take 1 extra strength Tylenol (acetaminophen) AND 2 Ibuprofen ('200mg'$  each) together every 4 hours as needed for pain. (do not take these if you are allergic to them or if you have a reason you should not take them.) Typically, you  may only need pain medication for 1 to 3 days.        Cryotherapy Aftercare  Wash gently with soap and water everyday.   Apply Vaseline and Band-Aid daily until healed.     If you have any questions or concerns for your doctor, please call our main line at [phone] and press option 4 to reach your doctor's medical assistant. If no one answers, please leave a voicemail as directed and we will return your call as soon as possible. Messages left after 4 pm will be answered the following business day.   You may also send [place] a message via [place]. We typically respond to MyChart messages within 1-2 business days.  For prescription refills, please ask your pharmacy to contact our office. Our fax number is [phone].  If you have an urgent issue when the clinic is closed that cannot wait until the next business day, you can page your doctor at the number below.    Please note that while we do our best to be available for urgent issues outside of office hours, we are not available 24/7.   If you have an urgent issue and are unable to reach [place], you may choose to seek medical care at your doctor's office, retail clinic, urgent care center, or emergency room.  If you have a medical emergency, please immediately call 911 or go to the emergency department.  Pager Numbers  - Dr. Nehemiah Massed: [phone]  - Dr. Laurence Ferrari: [phone]  - Dr. Nicole Kindred: [phone]  In the event  of inclement weather, please call our main line at [phone] for an update on the status of any delays or closures.  Dermatology Medication Tips: Please keep the boxes that topical medications come in in order to help keep track of the instructions about where and how to use these. Pharmacies typically print the medication instructions only on the boxes and not directly on the medication tubes.   If your medication is too expensive, please contact our office at [phone] option 4 or send [place] a message through  [place].   We are unable to tell what your co-pay for medications will be in advance as this is different depending on your insurance coverage. However, we may be able to find a substitute medication at lower cost or fill out paperwork to get insurance to cover a needed medication.   If a prior authorization is required to get your medication covered by your insurance company, please allow [place] 1-2 business days to complete this process.  Drug prices often vary depending on where the prescription is filled and some pharmacies may offer cheaper prices.  The website www.goodrx.com contains coupons for medications through different pharmacies. The prices here do not account for what the cost may be with help from insurance (it may be cheaper with your insurance), but the website can give you the price if you did not use any insurance.  - You can print the associated coupon and take it with your prescription to the pharmacy.  - You may also stop by our office during regular business hours and pick up a GoodRx coupon card.  - If you need your prescription sent electronically to a different pharmacy, notify our office through [facility] or by phone at [phone] option 4.

## 2021-03-12 DIAGNOSIS — I82C12 Acute embolism and thrombosis of left internal jugular vein: Secondary | ICD-10-CM | POA: Insufficient documentation

## 2021-03-18 ENCOUNTER — Telehealth: Payer: Self-pay

## 2021-03-18 ENCOUNTER — Ambulatory Visit: Payer: Medicare Other | Admitting: Family Medicine

## 2021-03-18 DIAGNOSIS — G894 Chronic pain syndrome: Secondary | ICD-10-CM | POA: Diagnosis not present

## 2021-03-18 DIAGNOSIS — M545 Low back pain, unspecified: Secondary | ICD-10-CM | POA: Diagnosis not present

## 2021-03-18 DIAGNOSIS — Z79891 Long term (current) use of opiate analgesic: Secondary | ICD-10-CM | POA: Diagnosis not present

## 2021-03-18 DIAGNOSIS — M5136 Other intervertebral disc degeneration, lumbar region: Secondary | ICD-10-CM | POA: Diagnosis not present

## 2021-03-18 NOTE — Telephone Encounter (Signed)
-----   Message from Alfonso Patten, MD sent at 03/18/2021 11:40 AM EDT ----- 1. Skin , left anterior neck VERRUCA VULGARIS, IRRITATED  This is a WART caused by the human papilloma virus. It is not dangerous but is contagious and can spread to other areas of skin or other people if it is not completely gone. No additional treatment is needed. However, if it comes back, we can freeze it in clinic with liquid nitrogen (a quick in office procedure) or you can also treat it at home with an over the counter salicylic wart treatment (slower).  Please call the office at [phone] or message [place] if you have have any questions.  2. Skin , left upper back medial BASAL CELL CARCINOMA, SUPERFICIAL AND NODULAR PATTERNS, BASE INVOLVED --> given small size, suggest ED&C over excision  3. Skin , left upper back lateral SUPERFICIAL BASAL CELL CARCINOMA, CLOSE TO MARGIN --> ED&C  4. Skin , right mid back BASAL CELL CARCINOMA, NODULAR PATTERN, PERIPHERAL MARGIN INVOLVED --> given small size, suggest ED&C over excision  Dr. Jerilynn Mages called 03/18/2021 at 11:40 am, no answer, left voicemail  MAs please call. Thank you!

## 2021-03-25 IMAGING — US IR BIOPSY CORE SALIVARY GLAND
1 series · 15 of 16 positions shown · non-contrast
Comparison: none

INDICATION: 69-year-old male with left parotid mass. He presents for
ultrasound-guided core biopsy

[Series 1: ir biopsy core salivary gland · 15 of 16 slices shown]
[im 1/16]
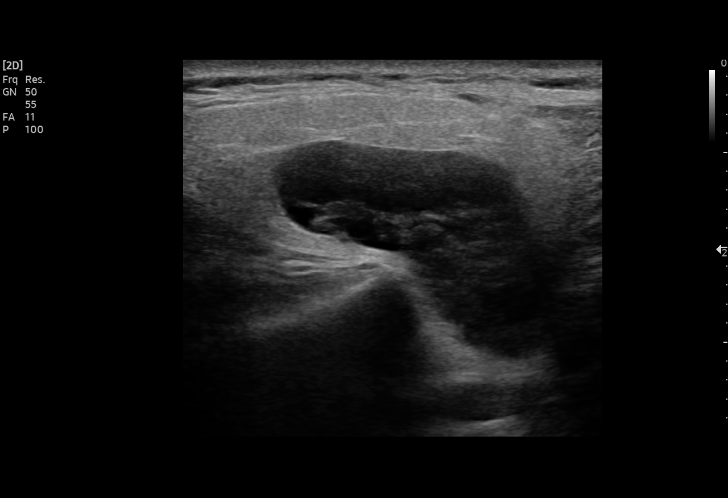
[im 2/16]
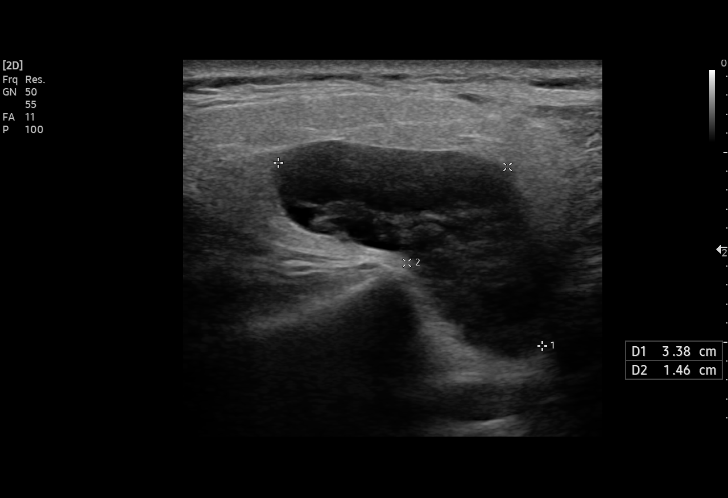
[im 3/16]
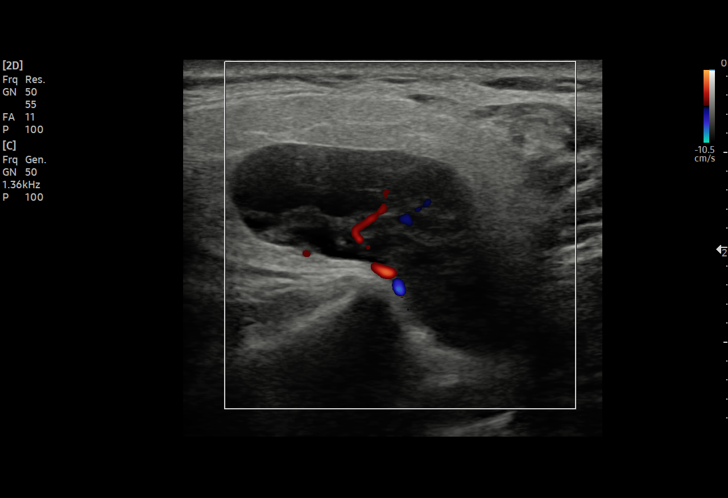
[im 4/16]
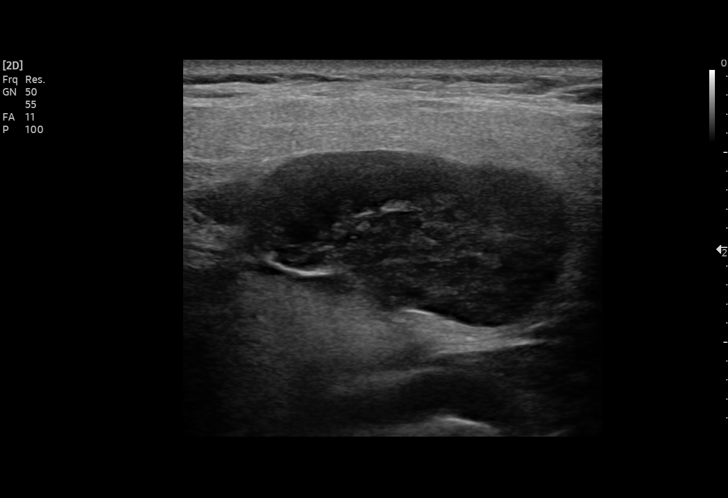
[im 5/16]
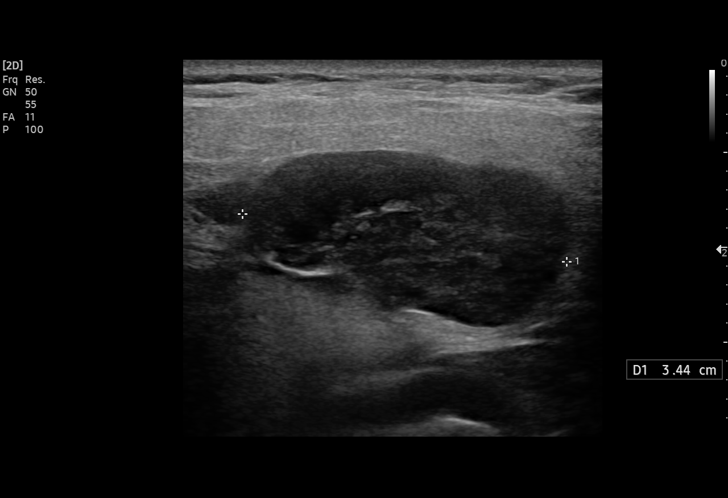
[im 6/16]
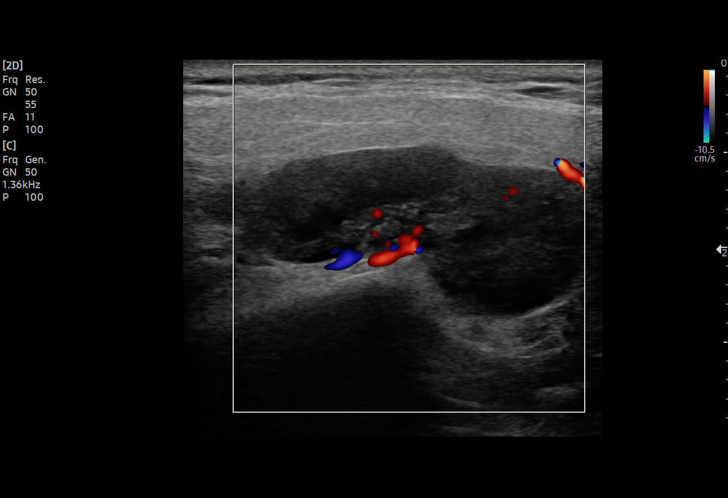
[im 7/16]
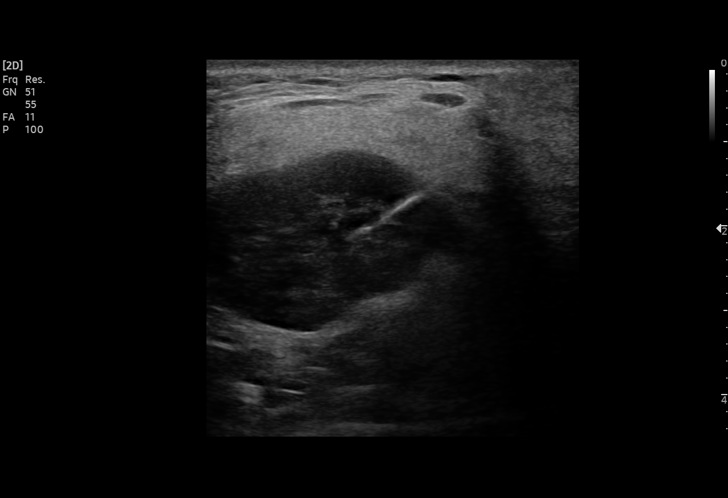
[im 9/16]
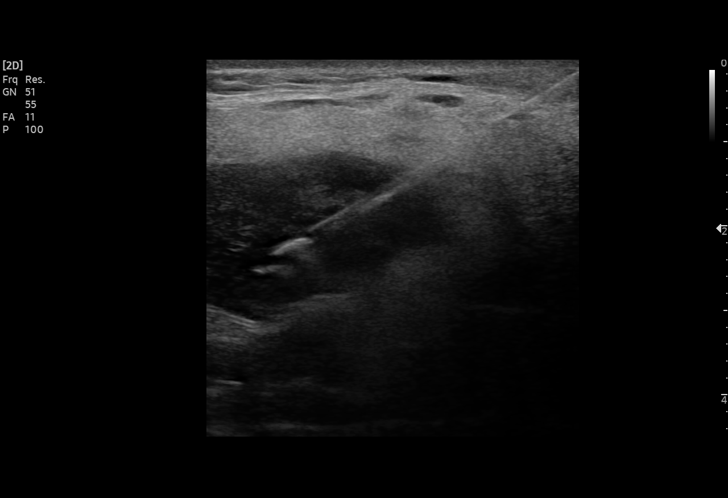
[im 10/16]
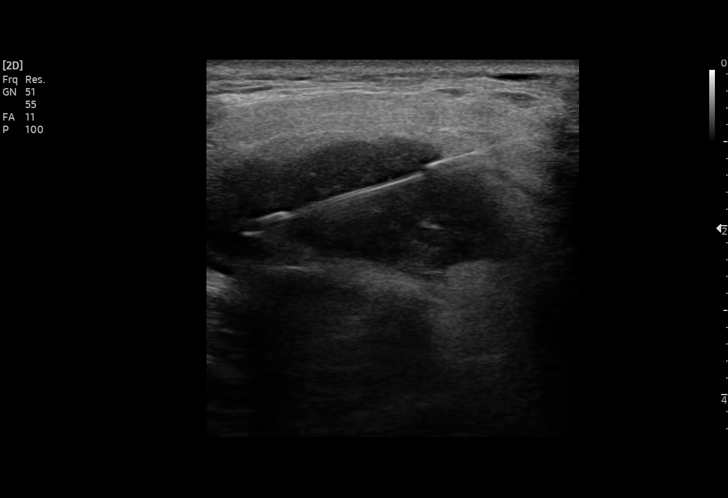
[im 11/16]
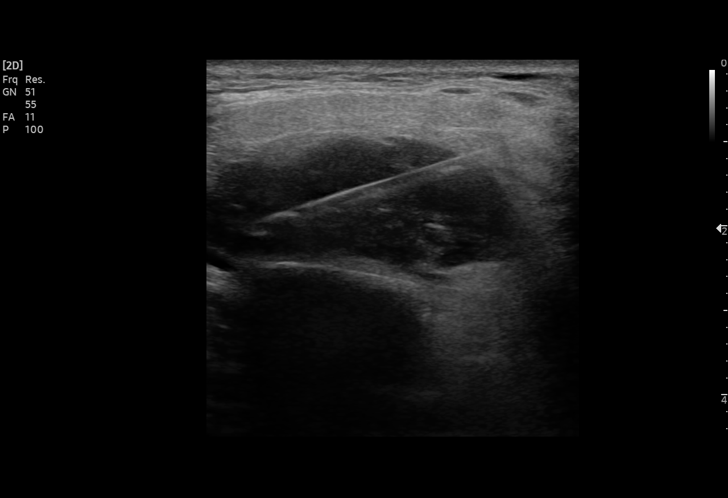
[im 12/16]
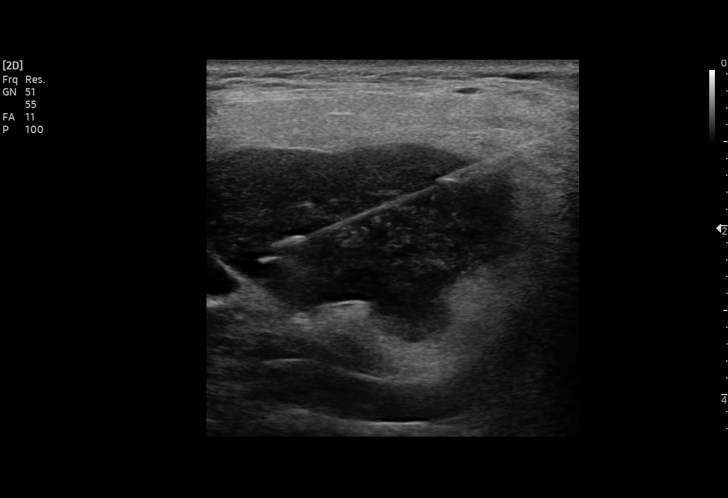
[im 13/16]
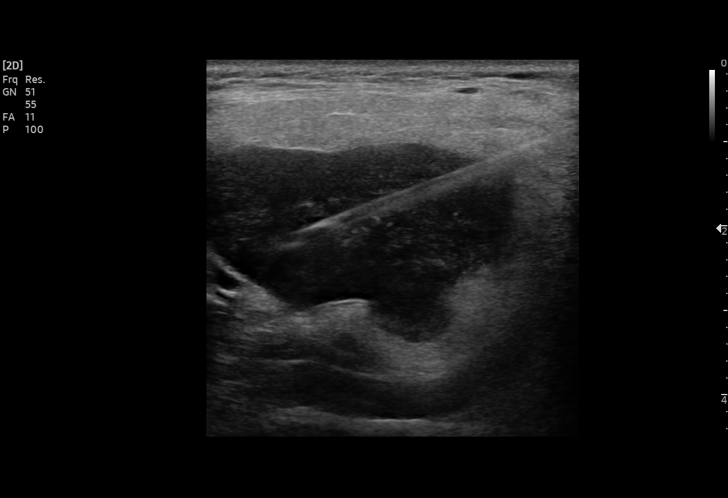
[im 14/16]
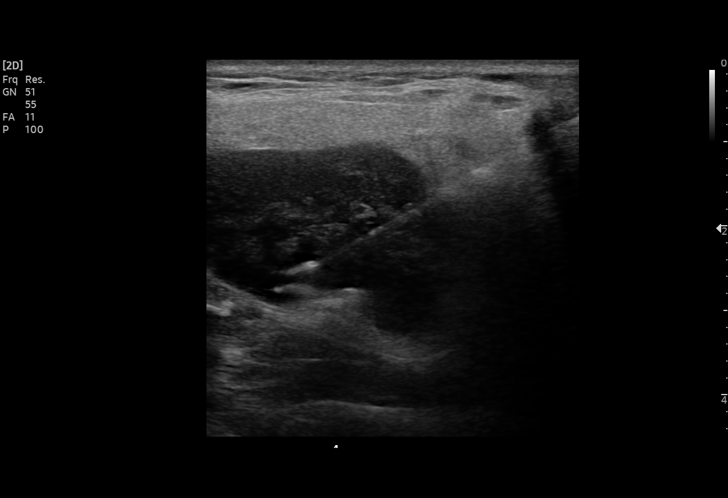
[im 15/16]
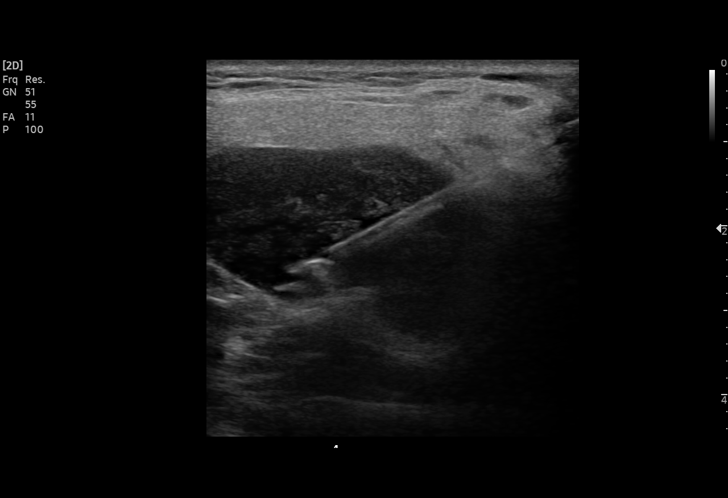
[im 16/16]
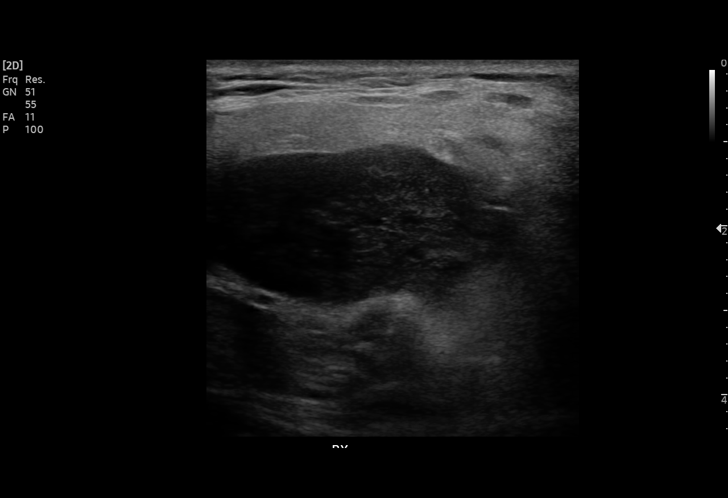

[15 of 16 positions shown; findings below may reference images not displayed]

EXAM:
Ultrasound-guided core biopsy of left parotid gland

MEDICATIONS:
None.

ANESTHESIA/SEDATION:
None

FLUOROSCOPY TIME:  None

COMPLICATIONS:
None immediate.

PROCEDURE:
Informed written consent was obtained from the patient after a
thorough discussion of the procedural risks, benefits and
alternatives. All questions were addressed. A timeout was performed
prior to the initiation of the procedure.

The left parotid gland was interrogated with ultrasound. A
heterogeneous partially cystic and partially solid mass was
identified in the deep aspect of the parotid gland. A suitable skin
entry site was selected and marked. The overlying skin was sterilely
prepped and draped in the standard fashion using chlorhexidine skin
prep. Local anesthesia was attained by infiltration with 1%
lidocaine. A small dermatotomy was made. Under real-time ultrasound
guidance, multiple 18 gauge core biopsies were obtained using the
Zakya Avril automated biopsy device. Biopsy specimens were placed in
formalin and delivered to pathology for further analysis.

Post biopsy ultrasound imaging demonstrates no evidence of immediate
complication. The patient tolerated the procedure well.
IMPRESSION: Technically successful ultrasound-guided core biopsy of left parotid
mass.

## 2021-03-27 NOTE — Progress Notes (Signed)
Name: Jimmy Green   MRN: [id number]    DOB: Feb 18, 1951   Date:03/31/2021       Progress Note  Subjective  Chief Complaint  Annual Exam  HPI  Patient presents for annual CPE   IPSS Questionnaire (AUA-7): Over the past month.   1)  How often have you had a sensation of not emptying your bladder completely after you finish urinating?  0 - Not at all  2)  How often have you had to urinate again less than two hours after you finished urinating? 0 - Not at all  3)  How often have you found you stopped and started again several times when you urinated?  0 - Not at all  4) How difficult have you found it to postpone urination?  0 - Not at all  5) How often have you had a weak urinary stream?  0 - Not at all  6) How often have you had to push or strain to begin urination?  0 - Not at all  7) How many times did you most typically get up to urinate from the time you went to bed until the time you got up in the morning?  1 - 1 time  Total score:  0-7 mildly symptomatic   8-19 moderately symptomatic   20-35 severely symptomatic     Diet: Everything I see Exercise: Doing yard work  Depression: phq 9 is positive Depression screen [facility] 2/9 03/31/2021 02/14/2021 11/13/2020 10/30/2020 09/12/2020  Decreased Interest 3 0 0 0 0  Down, Depressed, Hopeless 3 1 0 0 0  PHQ - 2 Score 6 1 0 0 0  Altered sleeping 0 0 0 0 -  Tired, decreased energy 0 0 0 0 -  Change in appetite 0 3 0 0 -  Feeling bad or failure about yourself  3 0 0 0 -  Trouble concentrating 0 1 0 0 -  Moving slowly or fidgety/restless 0 0 0 0 -  Suicidal thoughts 0 0 0 0 -  PHQ-9 Score 9 5 0 0 -  Difficult doing work/chores - Somewhat difficult - - -  Some recent data might be hidden    Hypertension:  BP Readings from Last 3 Encounters:  03/31/21 118/64  03/03/21 (!) 143/80  02/14/21 132/86    Obesity: Wt Readings from Last 3 Encounters:  03/31/21 224 lb (101.6 kg)  03/03/21 226 lb (102.5 kg)  02/14/21 226 lb (102.5 kg)    BMI Readings from Last 3 Encounters:  03/31/21 35.08 kg/m  03/03/21 35.93 kg/m  02/14/21 33.37 kg/m     Lipids:  Lab Results  Component Value Date   CHOL 112 03/14/2020   CHOL 136 11/13/2019   CHOL 132 02/13/2019   Lab Results  Component Value Date   HDL 28 (L) 03/14/2020   HDL 30 (L) 11/13/2019   HDL 30 (L) 02/13/2019   Lab Results  Component Value Date   LDLCALC 60 03/14/2020   LDLCALC 82 11/13/2019   LDLCALC 77 02/13/2019   Lab Results  Component Value Date   TRIG 154 (H) 03/14/2020   TRIG 138 11/13/2019   TRIG 151 (H) 02/13/2019   Lab Results  Component Value Date   CHOLHDL 4.0 03/14/2020   CHOLHDL 4.5 11/13/2019   CHOLHDL 4.4 02/13/2019   No results found for: LDLDIRECT Glucose:  Glucose, Bld  Date Value Ref Range Status  11/13/2020 216 (H) 65 - 99 mg/dL Final    Comment:    .  Fasting reference interval . For someone without known diabetes, a glucose value >125 mg/dL indicates that they may have diabetes and this should be confirmed with a follow-up test. .   03/14/2020 150 (H) 65 - 99 mg/dL Final    Comment:    .            Fasting reference interval . For someone without known diabetes, a glucose value >125 mg/dL indicates that they may have diabetes and this should be confirmed with a follow-up test. .   11/13/2019 145 (H) 65 - 99 mg/dL Final    Comment:    .            Fasting reference interval . For someone without known diabetes, a glucose value >125 mg/dL indicates that they may have diabetes and this should be confirmed with a follow-up test. .    Glucose-Capillary  Date Value Ref Range Status  04/09/2017 111 (H) 65 - 99 mg/dL Final  04/09/2017 105 (H) 65 - 99 mg/dL Final  04/08/2017 114 (H) 65 - 99 mg/dL Final    [place] Office Visit from 07/15/2020 in [facility]  AUDIT-C Score 1      Married STD testing and prevention (HIV/chl/gon/syphilis): not interested Hep C:  06/07/15  Skin cancer: Discussed monitoring for atypical lesions Colorectal cancer: 08/29/20 Prostate cancer: PSA last done 11/13/2020 Lab Results  Component Value Date   PSA 1.48 11/13/2020   PSA 1.7 07/14/2019   PSA 1.5 05/20/2018     Lung cancer:  Low Dose CT Chest recommended if Age 37-80 years, 30 pack-year currently smoking OR have quit w/in 15years. Patient does not qualify.   AAA: The USPSTF recommends one-time screening with ultrasonography in men ages 15 to 41 years who have ever smoked ECG:  05/11/17  Vaccines:   Shingrix: received shingrix 2019, had side effects and will not be getting second dose Pneumonia: educated and discussed with patient. Flu: educated and discussed with patient, will get today  Advanced Care Planning: A voluntary discussion about advance care planning including the explanation and discussion of advance directives.  Discussed health care proxy and Living will, and the patient was able to identify a health care proxy as wife.    Patient Active Problem List   Diagnosis Date Noted   Internal jugular vein thrombosis, left ([place]) 03/12/2021   History of DVT in adulthood 03/03/2021   Diabetes ([place]) 03/03/2021   Personal history of colonic polyps    Polyp of transverse colon    BPH with obstruction/lower urinary tract symptoms 05/20/2018   History of basal cell carcinoma 03/23/2018   Primary localized osteoarthritis of left hip 04/06/2017   Dyslipidemia associated with type 2 diabetes mellitus ([place]) 10/04/2016   Trigger thumb of both thumbs 06/02/2016   Chronic tension-type headache, intractable 09/25/2015   Benign paroxysmal positional vertigo due to bilateral vestibular disorder 09/25/2015   Hearing loss sensory, bilateral 08/26/2015   Cerebral microvascular disease 16/05/9603   Metabolic syndrome 54/04/8118   Vertigo 06/21/2015   Buzzing in ear 06/21/2015   Hypertension goal BP (blood pressure) < 140/90 03/28/2015   GERD (gastroesophageal reflux  disease) 03/28/2015   Hyperlipidemia 03/28/2015   Depression, major 03/28/2015   CAD in native artery 03/28/2015   Actinic keratosis 02/14/2015   Obstructive sleep apnea 02/03/2015   Diverticulosis of colon without hemorrhage 01/31/2015   Hx of colonic polyps    Benign neoplasm of descending colon    Benign neoplasm of sigmoid colon  Idiopathic colitis    Sebaceous cyst 09/14/2014    Past Surgical History:  Procedure Laterality Date   BACK SURGERY     BASAL CELL CARCINOMA EXCISION Left 12/02/2015   Chest-Done by Dermatologist    BASAL CELL CARCINOMA EXCISION Left 02/21/2018   Nodulocytic pattern, deep margin involved - [facility] - Dr. Brendolyn Patty   BASAL CELL CARCINOMA EXCISION  02/23/2018   CARDIAC CATHETERIZATION Left 04/01/2016   Procedure: Left Heart Cath and Coronary Angiography;  Surgeon: Teodoro Spray, MD;  Location: [place] [facility];  Service: Cardiovascular;  Laterality: Left;   CARDIAC CATHETERIZATION N/A 04/01/2016   Procedure: Intravascular Pressure Wire/FFR Study;  Surgeon: Isaias Cowman, MD;  Location: [place] [facility];  Service: Cardiovascular;  Laterality: N/A;   COLONOSCOPY N/A 01/15/2015   Wohl-ileitis, 2 benign polyps, cryptitis, sigmoid diverticulosis, focal ulceration ICV   COLONOSCOPY WITH PROPOFOL N/A 08/29/2020   Procedure: COLONOSCOPY WITH PROPOFOL;  Surgeon: Lucilla Lame, MD;  Location: [facility];  Service: Endoscopy;  Laterality: N/A;   CORONARY STENT PLACEMENT     FRACTIONAL FLOW RESERVE WIRE  10/08/2011   Procedure: FRACTIONAL FLOW RESERVE WIRE;  Surgeon: Clent Demark, MD;  Location: [place] [facility];  Service: Cardiovascular;;   HERNIA REPAIR     KNEE SURGERY     LEFT HEART CATHETERIZATION WITH CORONARY ANGIOGRAM N/A 10/08/2011   Procedure: LEFT HEART CATHETERIZATION WITH CORONARY ANGIOGRAM;  Surgeon: Clent Demark, MD;  Location: [place] [facility];  Service: Cardiovascular;  Laterality: N/A;   parodectomy Left 05/07/2020   SKIN  CANCER EXCISION  01/2018   [facility]   TOTAL HIP ARTHROPLASTY Left 04/06/2017   Procedure: TOTAL HIP ARTHROPLASTY ANTERIOR APPROACH;  Surgeon: Hessie Knows, MD;  Location: [facility] ORS;  Service: Orthopedics;  Laterality: Left;    Family History  Problem Relation Age of Onset   Lung cancer Father    Heart disease Father    Skin cancer Father     Social History   Socioeconomic History   Marital status: Married    Spouse name: Thayer Headings   Number of children: 1   Years of education: Not on file   Highest education level: High school graduate  Occupational History   Occupation: retired  Tobacco Use   Smoking status: Former    Years: 40.00    Types: Cigarettes    Quit date: 08/03/2012    Years since quitting: 8.6   Smokeless tobacco: Never  Vaping Use   Vaping Use: Never used  Substance and Sexual Activity   Alcohol use: No    Alcohol/week: 0.0 standard drinks   Drug use: No   Sexual activity: Yes    Partners: Female    Birth control/protection: None  Other Topics Concern   Not on file  Social History Narrative   Wife has 2 children by another marriage and he has 1 child by a previous marriage.   Social Determinants of Health   Financial Resource Strain: Low Risk    Difficulty of Paying Living Expenses: Not hard at all  Food Insecurity: No Food Insecurity   Worried About Charity fundraiser in the Last Year: Never true   [place] in the Last Year: Never true  Transportation Needs: No Transportation Needs   Lack of Transportation (Medical): No   Lack of Transportation (Non-Medical): No  Physical Activity: Sufficiently Active   Days of Exercise per Week: 7 days   Minutes of Exercise per Session: 40 min  Stress: No Stress Concern Present   Feeling of Stress : Only a little  Social Connections: Socially Isolated   Frequency of Communication with Friends and Family: Once a week   Frequency of Social Gatherings with Friends and Family: Once a week   Attends  Religious Services: Never   Marine scientist or Organizations: No   Attends Music therapist: Never   Marital Status: Married  Human resources officer Violence: Not At Risk   Fear of Current or Ex-Partner: No   Emotionally Abused: No   Physically Abused: No   Sexually Abused: No     Current Outpatient Medications:    Accu-Chek FastClix Lancets MISC, USE AS INSTRUCTED ONCE DAILY, Disp: 102 each, Rfl: 12   ACCU-CHEK GUIDE test strip, USE AS INSTRUCTED ONCE DAILY, Disp: 100 strip, Rfl: 12   amLODipine (NORVASC) 2.5 MG tablet, Take 1 tablet (2.5 mg total) by mouth daily., Disp: 90 tablet, Rfl: 1   apixaban (ELIQUIS) 5 MG TABS tablet, Take 1 tablet (5 mg total) by mouth 2 (two) times daily., Disp: 60 tablet, Rfl: 3   aspirin EC 81 MG tablet, Take 81 mg by mouth every morning. , Disp: , Rfl:    buPROPion (WELLBUTRIN XL) 150 MG 24 hr tablet, Take 1 tablet (150 mg total) by mouth daily., Disp: 30 tablet, Rfl: 0   buPROPion (WELLBUTRIN XL) 300 MG 24 hr tablet, Take 1 tablet (300 mg total) by mouth daily. New dose, instead of 3 of 150 mg, Disp: 90 tablet, Rfl: 1   dapagliflozin propanediol (FARXIGA) 10 MG TABS tablet, Take 1 tablet (10 mg total) by mouth daily before breakfast., Disp: 90 tablet, Rfl: 1   Erenumab-aooe (AIMOVIG) 140 MG/ML SOAJ, Inject 140 mg into the skin every 30 (thirty) days., Disp: 1.12 mL, Rfl: 5   escitalopram (LEXAPRO) 20 MG tablet, Take 1 tablet (20 mg total) by mouth daily., Disp: 90 tablet, Rfl: 1   gabapentin (NEURONTIN) 300 MG capsule, Take 1 capsule (300 mg total) by mouth at bedtime., Disp: 90 capsule, Rfl: 1   metFORMIN (GLUCOPHAGE-XR) 750 MG 24 hr tablet, Take 2 tablets (1,500 mg total) by mouth daily with breakfast., Disp: 180 tablet, Rfl: 0   metoprolol succinate (TOPROL-XL) 50 MG 24 hr tablet, Take 1 tablet (50 mg total) by mouth every evening. Take with or immediately following a meal., Disp: 90 tablet, Rfl: 1   NARCAN 4 MG/0.1ML LIQD nasal spray kit, ,  Disp: , Rfl: 0   nitroGLYCERIN (NITROSTAT) 0.4 MG SL tablet, DISSOLVE 1 TABLET UNDER THE TONGUE EVERY 5 MINUTES FOR UP TO 3 DOSES AS NEEDED FOR CHEST PAIN. IF NO RELIEF AFTER 3 DOSES, CALL 911 OR GO TO ER., Disp: 25 tablet, Rfl: 0   olmesartan-hydrochlorothiazide (BENICAR HCT) 40-25 MG tablet, Take 1 tablet by mouth daily., Disp: 90 tablet, Rfl: 1   omeprazole (PRILOSEC) 40 MG capsule, Take 1 capsule (40 mg total) by mouth daily., Disp: 90 capsule, Rfl: 1   oxyCODONE (ROXICODONE) 15 MG immediate release tablet, Take 1 tablet by mouth 5 (five) times daily as needed., Disp: , Rfl:    PREVIDENT 5000 SENSITIVE 1.1-5 % PSTE, Apply 1 each topically 2 (two) times daily., Disp: , Rfl: 5   rosuvastatin (CRESTOR) 40 MG tablet, Take 1 tablet (40 mg total) by mouth daily., Disp: 90 tablet, Rfl: 3   tretinoin (RETIN-A) 0.025 % cream, Apply a thin film to face at bedtime, Disp: 45 g, Rfl: 0  Allergies  Allergen Reactions  Elavil [Amitriptyline Hcl] Rash   Tape Rash    Paper Tape     ROS  Constitutional: Negative for fever, positive weight change.  Respiratory: Negative for cough and shortness of breath.   Cardiovascular: Negative for chest pain or palpitations.  Gastrointestinal: Negative for abdominal pain, no bowel changes.  Musculoskeletal: Negative for gait problem or joint swelling.  Skin: Negative for rash.  Neurological: Negative for dizziness , positive for intermittent  headache.  No other specific complaints in a complete review of systems (except as listed in HPI above).    Objective  Vitals:   03/31/21 1314  BP: 118/64  Pulse: 72  Resp: 16  Temp: 98.8 F (37.1 C)  SpO2: 96%  Weight: 224 lb (101.6 kg)  Height: _0  (1.702 m)    Body mass index is 35.08 kg/m.  Physical Exam  Constitutional: Patient appears well-developed and well-nourished. No distress.  HENT: Head: Normocephalic and atraumatic. Ears: B TMs ok, no erythema or effusion; Nose: Nose normal. Mouth/Throat:  not done.  Eyes: Conjunctivae and EOM are normal. Pupils are equal, round, and reactive to light. No scleral icterus.  Neck: Normal range of motion. Neck supple. No JVD present. No thyromegaly present.  Cardiovascular: Normal rate, regular rhythm and normal heart sounds.  No murmur heard. No BLE edema. Pulmonary/Chest: Effort normal and breath sounds normal. No respiratory distress. Abdominal: Soft. Bowel sounds are normal, no distension. There is no tenderness. Large hernia noted to mid upper abdomen above umbilicus.  MALE GENITALIA: Not done RECTAL: not done Musculoskeletal: Normal range of motion, no joint effusions. No gross deformities Neurological: he is alert and oriented to person, place, and time. No cranial nerve deficit. Coordination, balance, strength, speech and gait are normal.  Skin: Skin is warm and dry. No rash noted. No erythema.  Psychiatric: Patient has a normal mood and affect. behavior is normal. Judgment and thought content normal.   Recent Results (from the past 2160 hour(s))  APTT     Status: None   Collection Time: 03/05/21  7:31 AM  Result Value Ref Range   aPTT 35 24 - 36 seconds    Comment: Performed at [facility], [address] [place], [place] [address]     Fall Risk: Fall Risk  03/31/2021 02/14/2021 11/13/2020 10/30/2020 09/12/2020  Falls in the past year? 0 0 0 0 0  Number falls in past yr: 0 0 - 0 0  Injury with Fall? 0 0 - 0 0  Risk for fall due to : No Fall Risks - - - No Fall Risks  Follow up Falls prevention discussed - Falls prevention discussed - Falls prevention discussed      Assessment & Plan  1. Well adult exam   2. B12 deficiency  - cyanocobalamin ((VITAMIN B-12)) injection 1,000 mcg  3. Hypertension associated with diabetes ([place])  - Microalbumin / creatinine urine ratio - Hemoglobin A1c  4. Dyslipidemia associated with type 2 diabetes mellitus ([facility])  - Lipid panel - Microalbumin / creatinine urine ratio  5.  Moderate episode of recurrent major depressive disorder ([facility])  - buPROPion (WELLBUTRIN XL) 150 MG 24 hr tablet; Take 1 tablet (150 mg total) by mouth daily.  Dispense: 30 tablet; Refill: 0  6. Needs flu shot  - Flu vaccine HIGH DOSE PF (Fluzone High dose)    -Prostate cancer screening and PSA options (with potential risks and benefits of testing vs not testing) were discussed along with recent recs/guidelines. -USPSTF grade A and  B recommendations reviewed with patient; age-appropriate recommendations, preventive care, screening tests, etc discussed and encouraged; healthy living encouraged; see AVS for patient education given to patient -Discussed importance of 150 minutes of physical activity weekly, eat two servings of fish weekly, eat one serving of tree nuts ( cashews, pistachios, pecans, almonds.[name]) every other day, eat 6 servings of fruit/vegetables daily and drink plenty of water and avoid sweet beverages.

## 2021-03-27 NOTE — Patient Instructions (Signed)
Preventive Care 65 Years and Older, Male Preventive care refers to lifestyle choices and visits with your health care provider that can promote health and wellness. This includes: A yearly physical exam. This is also called an annual wellness visit. Regular dental and eye exams. Immunizations. Screening for certain conditions. Healthy lifestyle choices, such as: Eating a healthy diet. Getting regular exercise. Not using drugs or products that contain nicotine and tobacco. Limiting alcohol use. What can I expect for my preventive care visit? Physical exam Your health care provider will check your: Height and weight. These may be used to calculate your BMI (body mass index). BMI is a measurement that tells if you are at a healthy weight. Heart rate and blood pressure. Body temperature. Skin for abnormal spots. Counseling Your health care provider may ask you questions about your: Past medical problems. Family's medical history. Alcohol, tobacco, and drug use. Emotional well-being. Home life and relationship well-being. Sexual activity. Diet, exercise, and sleep habits. History of falls. Memory and ability to understand (cognition). Work and work environment. Access to firearms. What immunizations do I need?  Vaccines are usually given at various ages, according to a schedule. Your health care provider will recommend vaccines for you based on your age, medicalhistory, and lifestyle or other factors, such as travel or where you work. What tests do I need? Blood tests Lipid and cholesterol levels. These may be checked every 5 years, or more often depending on your overall health. Hepatitis C test. Hepatitis B test. Screening Lung cancer screening. You may have this screening every year starting at age 55 if you have a 30-pack-year history of smoking and currently smoke or have quit within the past 15 years. Colorectal cancer screening. All adults should have this screening  starting at age 50 and continuing until age 75. Your health care provider may recommend screening at age 45 if you are at increased risk. You will have tests every 1-10 years, depending on your results and the type of screening test. Prostate cancer screening. Recommendations will vary depending on your family history and other risks. Genital exam to check for testicular cancer or hernias. Diabetes screening. This is done by checking your blood sugar (glucose) after you have not eaten for a while (fasting). You may have this done every 1-3 years. Abdominal aortic aneurysm (AAA) screening. You may need this if you are a current or former smoker. STD (sexually transmitted disease) testing, if you are at risk. Follow these instructions at home: Eating and drinking  Eat a diet that includes fresh fruits and vegetables, whole grains, lean protein, and low-fat dairy products. Limit your intake of foods with high amounts of sugar, saturated fats, and salt. Take vitamin and mineral supplements as recommended by your health care provider. Do not drink alcohol if your health care provider tells you not to drink. If you drink alcohol: Limit how much you have to 0-2 drinks a day. Be aware of how much alcohol is in your drink. In the [place] one drink equals one 12 oz bottle of beer (355 mL), one 5 oz glass of wine (148 mL), or one 1 oz glass of hard liquor (44 mL).  Lifestyle Take daily care of your teeth and gums. Brush your teeth every morning and night with fluoride toothpaste. Floss one time each day. Stay active. Exercise for at least 30 minutes 5 or more days each week. Do not use any products that contain nicotine or tobacco, such as cigarettes, e-cigarettes, and chewing tobacco.   If you need help quitting, ask your health care provider. Do not use drugs. If you are sexually active, practice safe sex. Use a condom or other form of protection to prevent STIs (sexually transmitted infections). Talk  with your health care provider about taking a low-dose aspirin or statin. Find healthy ways to cope with stress, such as: Meditation, yoga, or listening to music. Journaling. Talking to a trusted person. Spending time with friends and family. Safety Always wear your seat belt while driving or riding in a vehicle. Do not drive: If you have been drinking alcohol. Do not ride with someone who has been drinking. When you are tired or distracted. While texting. Wear a helmet and other protective equipment during sports activities. If you have firearms in your house, make sure you follow all gun safety procedures. What's next? Visit your health care provider once a year for an annual wellness visit. Ask your health care provider how often you should have your eyes and teeth checked. Stay up to date on all vaccines. This information is not intended to replace advice given to you by your health care provider. Make sure you discuss any questions you have with your healthcare provider. Document Revised: 04/18/2019 Document Reviewed: 07/14/2018 Elsevier Patient Education  2022 [employer]  

## 2021-03-31 ENCOUNTER — Other Ambulatory Visit: Payer: Self-pay

## 2021-03-31 ENCOUNTER — Encounter: Payer: Self-pay | Admitting: Family Medicine

## 2021-03-31 ENCOUNTER — Ambulatory Visit (INDEPENDENT_AMBULATORY_CARE_PROVIDER_SITE_OTHER): Payer: Medicare Other | Admitting: Family Medicine

## 2021-03-31 VITALS — BP 118/64 | HR 72 | Temp 98.8°F | Resp 16 | Ht 67.0 in | Wt 224.0 lb

## 2021-03-31 DIAGNOSIS — E785 Hyperlipidemia, unspecified: Secondary | ICD-10-CM | POA: Diagnosis not present

## 2021-03-31 DIAGNOSIS — I152 Hypertension secondary to endocrine disorders: Secondary | ICD-10-CM | POA: Diagnosis not present

## 2021-03-31 DIAGNOSIS — F331 Major depressive disorder, recurrent, moderate: Secondary | ICD-10-CM

## 2021-03-31 DIAGNOSIS — E538 Deficiency of other specified B group vitamins: Secondary | ICD-10-CM | POA: Diagnosis not present

## 2021-03-31 DIAGNOSIS — Z0000 Encounter for general adult medical examination without abnormal findings: Secondary | ICD-10-CM | POA: Diagnosis not present

## 2021-03-31 DIAGNOSIS — E1159 Type 2 diabetes mellitus with other circulatory complications: Secondary | ICD-10-CM

## 2021-03-31 DIAGNOSIS — Z23 Encounter for immunization: Secondary | ICD-10-CM

## 2021-03-31 DIAGNOSIS — E1169 Type 2 diabetes mellitus with other specified complication: Secondary | ICD-10-CM

## 2021-03-31 MED ORDER — BUPROPION HCL ER (XL) 150 MG PO TB24: 150.0000 mg | ORAL_TABLET | Freq: Every day | ORAL | 0 refills | Status: DC

## 2021-03-31 MED ORDER — CYANOCOBALAMIN 1000 MCG/ML IJ SOLN
1000.0000 ug | Freq: Once | INTRAMUSCULAR | Status: AC
  Administered 2021-03-31: 1000 ug via INTRAMUSCULAR

## 2021-04-01 LAB — HEMOGLOBIN A1C
Hgb A1c MFr Bld: 8.6 % of total Hgb — ABNORMAL HIGH (ref <5.7)
Mean Plasma Glucose: 200 mg/dL
eAG (mmol/L): 11.1 mmol/L

## 2021-04-01 LAB — LIPID PANEL
Cholesterol: 111 mg/dL (ref <200)
HDL: 24 mg/dL — ABNORMAL LOW (ref >=40)
LDL Cholesterol (Calc): 58 mg/dL (calc)
Non-HDL Cholesterol (Calc): 87 mg/dL (calc) (ref <130)
Total CHOL/HDL Ratio: 4.6 (calc) (ref <5.0)
Triglycerides: 218 mg/dL — ABNORMAL HIGH (ref <150)

## 2021-04-01 LAB — MICROALBUMIN / CREATININE URINE RATIO
Creatinine, Urine: 178 mg/dL (ref 20–320)
Microalb Creat Ratio: 25 mcg/mg creat (ref <30)
Microalb, Ur: 4.5 mg/dL

## 2021-04-15 DIAGNOSIS — M25519 Pain in unspecified shoulder: Secondary | ICD-10-CM | POA: Diagnosis not present

## 2021-04-15 DIAGNOSIS — M9904 Segmental and somatic dysfunction of sacral region: Secondary | ICD-10-CM | POA: Diagnosis not present

## 2021-04-15 DIAGNOSIS — G8929 Other chronic pain: Secondary | ICD-10-CM | POA: Diagnosis not present

## 2021-04-15 DIAGNOSIS — M545 Low back pain, unspecified: Secondary | ICD-10-CM | POA: Diagnosis not present

## 2021-04-15 DIAGNOSIS — R519 Headache, unspecified: Secondary | ICD-10-CM | POA: Diagnosis not present

## 2021-04-15 DIAGNOSIS — M47897 Other spondylosis, lumbosacral region: Secondary | ICD-10-CM | POA: Diagnosis not present

## 2021-04-15 DIAGNOSIS — M169 Osteoarthritis of hip, unspecified: Secondary | ICD-10-CM | POA: Diagnosis not present

## 2021-04-15 DIAGNOSIS — M79669 Pain in unspecified lower leg: Secondary | ICD-10-CM | POA: Diagnosis not present

## 2021-04-15 DIAGNOSIS — G894 Chronic pain syndrome: Secondary | ICD-10-CM | POA: Diagnosis not present

## 2021-04-15 DIAGNOSIS — M9973 Connective tissue and disc stenosis of intervertebral foramina of lumbar region: Secondary | ICD-10-CM | POA: Diagnosis not present

## 2021-04-15 DIAGNOSIS — M48062 Spinal stenosis, lumbar region with neurogenic claudication: Secondary | ICD-10-CM | POA: Diagnosis not present

## 2021-04-15 NOTE — Progress Notes (Addendum)
Name: Jimmy Green   MRN: [id number]    DOB: 05-26-1951   Date:04/16/2021       Progress Note  Subjective  Chief Complaint  Follow Up  HPI  HTN: bp is at goal, he is taking Benicar hctz 40/25 and has noticed increase in urinary frequency but able to tolerate side effects. He also takes metoprolol. He denies chest pain or palpitation   DM: he has not been checking his glucose lately, A1C back in august  was 8.6 % , he has not been compliant with his diet. He has associated obesity, dyslipidemia, HTN. He is on statin therapy and Benicar . He is currently taking [place] and Metformin, we discussed options and he is willing to take Rybelsus. He denies personal history of pancreatitis or family history of thyroid cancer.   Depression Major Recurrent: symptoms started when he lost his job in 2016, he has been on Wellbutrin 450 mg and Lexapro 20 mg daily. He has been feeling well, phq 9 has improved. He does not want to change anything at this time   CAD with Angina: medically managed, on beta blocker and statin therapy , no pain at this time   Patient Active Problem List   Diagnosis Date Noted   Internal jugular vein thrombosis, left ([place]) 03/12/2021   History of DVT in adulthood 03/03/2021   Diabetes ([place]) 03/03/2021   Personal history of colonic polyps    Polyp of transverse colon    BPH with obstruction/lower urinary tract symptoms 05/20/2018   History of basal cell carcinoma 03/23/2018   Primary localized osteoarthritis of left hip 04/06/2017   Dyslipidemia associated with type 2 diabetes mellitus ([place]) 10/04/2016   Trigger thumb of both thumbs 06/02/2016   Chronic tension-type headache, intractable 09/25/2015   Benign paroxysmal positional vertigo due to bilateral vestibular disorder 09/25/2015   Hearing loss sensory, bilateral 08/26/2015   Cerebral microvascular disease 80/99/8338   Metabolic syndrome 25/12/3974   Vertigo 06/21/2015   Buzzing in ear 06/21/2015   Hypertension  goal BP (blood pressure) < 140/90 03/28/2015   GERD (gastroesophageal reflux disease) 03/28/2015   Hyperlipidemia 03/28/2015   Depression, major 03/28/2015   CAD in native artery 03/28/2015   Actinic keratosis 02/14/2015   Obstructive sleep apnea 02/03/2015   Diverticulosis of colon without hemorrhage 01/31/2015   Hx of colonic polyps    Benign neoplasm of descending colon    Benign neoplasm of sigmoid colon    Idiopathic colitis    Sebaceous cyst 09/14/2014    Past Surgical History:  Procedure Laterality Date   BACK SURGERY     BASAL CELL CARCINOMA EXCISION Left 12/02/2015   Chest-Done by Dermatologist    BASAL CELL CARCINOMA EXCISION Left 02/21/2018   Nodulocytic pattern, deep margin involved - [facility] - Dr. Brendolyn Patty   BASAL CELL CARCINOMA EXCISION  02/23/2018   CARDIAC CATHETERIZATION Left 04/01/2016   Procedure: Left Heart Cath and Coronary Angiography;  Surgeon: Teodoro Spray, MD;  Location: [place] [facility];  Service: Cardiovascular;  Laterality: Left;   CARDIAC CATHETERIZATION N/A 04/01/2016   Procedure: Intravascular Pressure Wire/FFR Study;  Surgeon: Isaias Cowman, MD;  Location: [place] [facility];  Service: Cardiovascular;  Laterality: N/A;   COLONOSCOPY N/A 01/15/2015   Wohl-ileitis, 2 benign polyps, cryptitis, sigmoid diverticulosis, focal ulceration ICV   COLONOSCOPY WITH PROPOFOL N/A 08/29/2020   Procedure: COLONOSCOPY WITH PROPOFOL;  Surgeon: Lucilla Lame, MD;  Location: [facility];  Service: Endoscopy;  Laterality: N/A;  CORONARY STENT PLACEMENT     FRACTIONAL FLOW RESERVE WIRE  10/08/2011   Procedure: FRACTIONAL FLOW RESERVE WIRE;  Surgeon: Clent Demark, MD;  Location: [facility];  Service: Cardiovascular;;   HERNIA REPAIR     KNEE SURGERY     LEFT HEART CATHETERIZATION WITH CORONARY ANGIOGRAM N/A 10/08/2011   Procedure: LEFT HEART CATHETERIZATION WITH CORONARY ANGIOGRAM;  Surgeon: Clent Demark, MD;  Location: [facility];   Service: Cardiovascular;  Laterality: N/A;   parodectomy Left 05/07/2020   SKIN CANCER EXCISION  01/2018   [facility]   TOTAL HIP ARTHROPLASTY Left 04/06/2017   Procedure: TOTAL HIP ARTHROPLASTY ANTERIOR APPROACH;  Surgeon: Hessie Knows, MD;  Location: [facility];  Service: Orthopedics;  Laterality: Left;    Family History  Problem Relation Age of Onset   Lung cancer Father    Heart disease Father    Skin cancer Father     Social History   Tobacco Use   Smoking status: Former    Years: 40.00    Types: Cigarettes    Quit date: 08/03/2012    Years since quitting: 8.7   Smokeless tobacco: Never  Substance Use Topics   Alcohol use: No    Alcohol/week: 0.0 standard drinks     Current Outpatient Medications:    Accu-Chek FastClix Lancets MISC, USE AS INSTRUCTED ONCE DAILY, Disp: 102 each, Rfl: 12   ACCU-CHEK GUIDE test strip, USE AS INSTRUCTED ONCE DAILY, Disp: 100 strip, Rfl: 12   apixaban (ELIQUIS) 5 MG TABS tablet, Take 1 tablet (5 mg total) by mouth 2 (two) times daily., Disp: 60 tablet, Rfl: 3   aspirin EC 81 MG tablet, Take 81 mg by mouth every morning. , Disp: , Rfl:    Erenumab-aooe (AIMOVIG) 140 MG/ML SOAJ, Inject 140 mg into the skin every 30 (thirty) days., Disp: 1.12 mL, Rfl: 5   NARCAN 4 MG/0.1ML LIQD nasal spray kit, , Disp: , Rfl: 0   nitroGLYCERIN (NITROSTAT) 0.4 MG SL tablet, DISSOLVE 1 TABLET UNDER THE TONGUE EVERY 5 MINUTES FOR UP TO 3 DOSES AS NEEDED FOR CHEST PAIN. IF NO RELIEF AFTER 3 DOSES, CALL 911 OR GO TO ER., Disp: 25 tablet, Rfl: 0   olmesartan-hydrochlorothiazide (BENICAR HCT) 40-25 MG tablet, Take 1 tablet by mouth daily., Disp: 90 tablet, Rfl: 1   oxyCODONE (ROXICODONE) 15 MG immediate release tablet, Take 1 tablet by mouth 5 (five) times daily as needed., Disp: , Rfl:    PREVIDENT 5000 SENSITIVE 1.1-5 % PSTE, Apply 1 each topically 2 (two) times daily., Disp: , Rfl: 5   rosuvastatin (CRESTOR) 40 MG tablet, Take 1 tablet (40 mg total) by mouth  daily., Disp: 90 tablet, Rfl: 3   Semaglutide (RYBELSUS) 7 MG TABS, Take 7 mg by mouth daily., Disp: 90 tablet, Rfl: 0   tretinoin (RETIN-A) 0.025 % cream, Apply a thin film to face at bedtime, Disp: 45 g, Rfl: 0   amLODipine (NORVASC) 2.5 MG tablet, Take 1 tablet (2.5 mg total) by mouth daily., Disp: 90 tablet, Rfl: 1   buPROPion (WELLBUTRIN XL) 150 MG 24 hr tablet, Take 1 tablet (150 mg total) by mouth daily., Disp: 90 tablet, Rfl: 1   buPROPion (WELLBUTRIN XL) 300 MG 24 hr tablet, Take 1 tablet (300 mg total) by mouth daily., Disp: 90 tablet, Rfl: 1   dapagliflozin propanediol (FARXIGA) 10 MG TABS tablet, Take 1 tablet (10 mg total) by mouth daily before breakfast., Disp: 90 tablet, Rfl: 1   escitalopram (  LEXAPRO) 20 MG tablet, Take 1 tablet (20 mg total) by mouth daily., Disp: 90 tablet, Rfl: 1   gabapentin (NEURONTIN) 300 MG capsule, Take 1 capsule (300 mg total) by mouth at bedtime., Disp: 90 capsule, Rfl: 1   metFORMIN (GLUCOPHAGE-XR) 750 MG 24 hr tablet, Take 2 tablets (1,500 mg total) by mouth daily with breakfast., Disp: 180 tablet, Rfl: 1   metoprolol succinate (TOPROL-XL) 50 MG 24 hr tablet, Take 1 tablet (50 mg total) by mouth every evening. Take with or immediately following a meal., Disp: 90 tablet, Rfl: 1   omeprazole (PRILOSEC) 40 MG capsule, Take 1 capsule (40 mg total) by mouth daily., Disp: 90 capsule, Rfl: 1  Allergies  Allergen Reactions   Shingrix [Zoster Vac Recomb Adjuvanted]     Rash    Elavil [Amitriptyline Hcl] Rash   Tape Rash    Paper Tape    I personally reviewed active problem list, medication list, allergies, family history, social history, health maintenance with the patient/caregiver today.   ROS  Constitutional: Negative for fever or weight change.  Respiratory: Negative for cough and shortness of breath.   Cardiovascular: Negative for chest pain or palpitations.  Gastrointestinal: Negative for abdominal pain, no bowel changes.  Musculoskeletal:  Negative for gait problem or joint swelling.  Skin: Negative for rash.  Neurological: Negative for dizziness , denies recent episodes of headache.  No other specific complaints in a complete review of systems (except as listed in HPI above).   Objective  Vitals:   04/16/21 1336  BP: 126/78  Pulse: 67  Resp: 16  Temp: 98.2 F (36.8 C)  SpO2: 96%  Weight: 223 lb (101.2 kg)  Height: 5' 7" (1.702 m)    Body mass index is 34.93 kg/m.  Physical Exam  Constitutional: Patient appears well-developed and well-nourished. Obese  No distress.  HEENT: head atraumatic, normocephalic, pupils equal and reactive to light,  neck supple Cardiovascular: Normal rate, regular rhythm and normal heart sounds.  No murmur heard. No BLE edema. Pulmonary/Chest: Effort normal and breath sounds normal. No respiratory distress. Abdominal: Soft.  There is no tenderness. Psychiatric: Patient has a normal mood and affect. behavior is normal. Judgment and thought content normal.   Recent Results (from the past 2160 hour(s))  APTT     Status: None   Collection Time: 03/05/21  7:31 AM  Result Value Ref Range   aPTT 35 24 - 36 seconds    Comment: Performed at [facility], [address] [place], [place] [address]  Lipid panel     Status: Abnormal   Collection Time: 03/31/21  2:18 PM  Result Value Ref Range   Cholesterol 111 <200 mg/dL   HDL 24 (L) > OR = 40 mg/dL   Triglycerides 218 (H) <150 mg/dL    Comment: . If a non-fasting specimen was collected, consider repeat triglyceride testing on a fasting specimen if clinically indicated.  Yates Decamp et al. J. of Clin. Lipidol. 3500;9:381-829. [name]    LDL Cholesterol (Calc) 58 mg/dL (calc)    Comment: Reference range: <100 . Desirable range <100 mg/dL for primary prevention;   <70 mg/dL for patients with CHD or diabetic patients  with > or = 2 CHD risk factors. [name] LDL-C is now calculated using the Martin-Hopkins  calculation, which is a validated  novel method providing  better accuracy than the Friedewald equation in the  estimation of LDL-C.  Cresenciano Genre et al. Annamaria Helling. 9371;696(78): 2061-2068  (http://education.[facility].com/faq/FAQ164)    Total CHOL/HDL  Ratio 4.6 <5.0 (calc)   Non-HDL Cholesterol (Calc) 87 <130 mg/dL (calc)    Comment: For patients with diabetes plus 1 major ASCVD risk  factor, treating to a non-HDL-C goal of <100 mg/dL  (LDL-C of <70 mg/dL) is considered a therapeutic  option.   Microalbumin / creatinine urine ratio     Status: None   Collection Time: 03/31/21  2:18 PM  Result Value Ref Range   Creatinine, Urine 178 20 - 320 mg/dL   Microalb, Ur 4.5 mg/dL    Comment: Reference Range Not established    Microalb Creat Ratio 25 <30 mcg/mg creat    Comment: . The ADA defines abnormalities in albumin excretion as follows: [name] Albuminuria Category        Result (mcg/mg creatinine) . Normal to Mildly increased   <30 Moderately increased         30-299  Severely increased           > OR = 300 . The ADA recommends that at least two of three specimens collected within a 3-6 month period be abnormal before considering a patient to be within a diagnostic category.   Hemoglobin A1c     Status: Abnormal   Collection Time: 03/31/21  2:18 PM  Result Value Ref Range   Hgb A1c MFr Bld 8.6 (H) <5.7 % of total Hgb    Comment: For someone without known diabetes, a hemoglobin A1c value of 6.5% or greater indicates that they may have  diabetes and this should be confirmed with a follow-up  test. . For someone with known diabetes, a value <7% indicates  that their diabetes is well controlled and a value  greater than or equal to 7% indicates suboptimal  control. A1c targets should be individualized based on  duration of diabetes, age, comorbid conditions, and  other considerations. . Currently, no consensus exists regarding use of hemoglobin A1c for diagnosis of diabetes for children. .    Mean Plasma  Glucose 200 mg/dL   eAG (mmol/L) 11.1 mmol/L     PHQ2/9: Depression screen [facility] 2/9 04/16/2021 03/31/2021 02/14/2021 11/13/2020 10/30/2020  Decreased Interest 0 3 0 0 0  Down, Depressed, Hopeless 0 3 1 0 0  PHQ - 2 Score 0 6 1 0 0  Altered sleeping 0 0 0 0 0  Tired, decreased energy 0 0 0 0 0  Change in appetite 0 0 3 0 0  Feeling bad or failure about yourself  0 3 0 0 0  Trouble concentrating 0 0 1 0 0  Moving slowly or fidgety/restless 0 0 0 0 0  Suicidal thoughts 0 0 0 0 0  PHQ-9 Score 0 9 5 0 0  Difficult doing work/chores - - Somewhat difficult - -  Some recent data might be hidden    phq 9 is negative   Fall Risk: Fall Risk  04/16/2021 03/31/2021 02/14/2021 11/13/2020 10/30/2020  Falls in the past year? 0 0 0 0 0  Number falls in past yr: 0 0 0 - 0  Injury with Fall? 0 0 0 - 0  Risk for fall due to : No Fall Risks No Fall Risks - - -  Follow up Falls prevention discussed Falls prevention discussed - Falls prevention discussed -      Functional Status Survey: Is the patient deaf or have difficulty hearing?: No Does the patient have difficulty seeing, even when wearing glasses/contacts?: No Does the patient have difficulty concentrating, remembering, or making decisions?: No Does  the patient have difficulty walking or climbing stairs?: No Does the patient have difficulty dressing or bathing?: No Does the patient have difficulty doing errands alone such as visiting a doctor's office or shopping?: No    Assessment & Plan  1. Hypertension associated with diabetes ([place])  - amLODipine (NORVASC) 2.5 MG tablet; Take 1 tablet (2.5 mg total) by mouth daily.  Dispense: 90 tablet; Refill: 1 - dapagliflozin propanediol (FARXIGA) 10 MG TABS tablet; Take 1 tablet (10 mg total) by mouth daily before breakfast.  Dispense: 90 tablet; Refill: 1 - metoprolol succinate (TOPROL-XL) 50 MG 24 hr tablet; Take 1 tablet (50 mg total) by mouth every evening. Take with or immediately following a  meal.  Dispense: 90 tablet; Refill: 1  2. Hypertension, benign  - amLODipine (NORVASC) 2.5 MG tablet; Take 1 tablet (2.5 mg total) by mouth daily.  Dispense: 90 tablet; Refill: 1 - metoprolol succinate (TOPROL-XL) 50 MG 24 hr tablet; Take 1 tablet (50 mg total) by mouth every evening. Take with or immediately following a meal.  Dispense: 90 tablet; Refill: 1  3. Recurrent major depression in remission ([facility])  - buPROPion (WELLBUTRIN XL) 150 MG 24 hr tablet; Take 1 tablet (150 mg total) by mouth daily.  Dispense: 90 tablet; Refill: 1 - buPROPion (WELLBUTRIN XL) 300 MG 24 hr tablet; Take 1 tablet (300 mg total) by mouth daily.  Dispense: 90 tablet; Refill: 1 - escitalopram (LEXAPRO) 20 MG tablet; Take 1 tablet (20 mg total) by mouth daily.  Dispense: 90 tablet; Refill: 1  4. Dyslipidemia associated with type 2 diabetes mellitus ([facility])  - dapagliflozin propanediol (FARXIGA) 10 MG TABS tablet; Take 1 tablet (10 mg total) by mouth daily before breakfast.  Dispense: 90 tablet; Refill: 1 - metFORMIN (GLUCOPHAGE-XR) 750 MG 24 hr tablet; Take 2 tablets (1,500 mg total) by mouth daily with breakfast.  Dispense: 180 tablet; Refill: 1 - Semaglutide (RYBELSUS) 7 MG TABS; Take 7 mg by mouth daily.  Dispense: 90 tablet; Refill: 0  5. RLS (restless legs syndrome)  - gabapentin (NEURONTIN) 300 MG capsule; Take 1 capsule (300 mg total) by mouth at bedtime.  Dispense: 90 capsule; Refill: 1  6. Diabetes mellitus type 2 in obese (H[facility])  - metFORMIN (GLUCOPHAGE-XR) 750 MG 24 hr tablet; Take 2 tablets (1,500 mg total) by mouth daily with breakfast.  Dispense: 180 tablet; Refill: 1 - Semaglutide (RYBELSUS) 7 MG TABS; Take 7 mg by mouth daily.  Dispense: 90 tablet; Refill: 0  7. Unstable angina ([facility])  - metoprolol succinate (TOPROL-XL) 50 MG 24 hr tablet; Take 1 tablet (50 mg total) by mouth every evening. Take with or immediately following a meal.  Dispense: 90 tablet; Refill: 1  8. CAD in native artery  -  metoprolol succinate (TOPROL-XL) 50 MG 24 hr tablet; Take 1 tablet (50 mg total) by mouth every evening. Take with or immediately following a meal.  Dispense: 90 tablet; Refill: 1  9. Gastroesophageal reflux disease without esophagitis  - omeprazole (PRILOSEC) 40 MG capsule; Take 1 capsule (40 mg total) by mouth daily.  Dispense: 90 capsule; Refill: 1

## 2021-04-16 ENCOUNTER — Encounter: Payer: Self-pay | Admitting: Family Medicine

## 2021-04-16 ENCOUNTER — Ambulatory Visit (INDEPENDENT_AMBULATORY_CARE_PROVIDER_SITE_OTHER): Payer: Medicare Other | Admitting: Family Medicine

## 2021-04-16 ENCOUNTER — Other Ambulatory Visit: Payer: Self-pay

## 2021-04-16 ENCOUNTER — Ambulatory Visit: Payer: Medicare Other | Admitting: Dermatology

## 2021-04-16 VITALS — BP 126/78 | HR 67 | Temp 98.2°F | Resp 16 | Ht 67.0 in | Wt 223.0 lb

## 2021-04-16 DIAGNOSIS — I200 Unstable angina: Secondary | ICD-10-CM | POA: Diagnosis not present

## 2021-04-16 DIAGNOSIS — K219 Gastro-esophageal reflux disease without esophagitis: Secondary | ICD-10-CM

## 2021-04-16 DIAGNOSIS — G2581 Restless legs syndrome: Secondary | ICD-10-CM | POA: Diagnosis not present

## 2021-04-16 DIAGNOSIS — I2510 Atherosclerotic heart disease of native coronary artery without angina pectoris: Secondary | ICD-10-CM | POA: Diagnosis not present

## 2021-04-16 DIAGNOSIS — E785 Hyperlipidemia, unspecified: Secondary | ICD-10-CM

## 2021-04-16 DIAGNOSIS — F3340 Major depressive disorder, recurrent, in remission, unspecified: Secondary | ICD-10-CM

## 2021-04-16 DIAGNOSIS — E1159 Type 2 diabetes mellitus with other circulatory complications: Secondary | ICD-10-CM | POA: Diagnosis not present

## 2021-04-16 DIAGNOSIS — I152 Hypertension secondary to endocrine disorders: Secondary | ICD-10-CM | POA: Diagnosis not present

## 2021-04-16 DIAGNOSIS — E1169 Type 2 diabetes mellitus with other specified complication: Secondary | ICD-10-CM

## 2021-04-16 DIAGNOSIS — E669 Obesity, unspecified: Secondary | ICD-10-CM

## 2021-04-16 DIAGNOSIS — I10 Essential (primary) hypertension: Secondary | ICD-10-CM

## 2021-04-16 MED ORDER — DAPAGLIFLOZIN PROPANEDIOL 10 MG PO TABS: 10.0000 mg | ORAL_TABLET | Freq: Every day | ORAL | 1 refills | Status: DC

## 2021-04-16 MED ORDER — METOPROLOL SUCCINATE ER 50 MG PO TB24: 50.0000 mg | ORAL_TABLET | Freq: Every evening | ORAL | 1 refills | Status: DC

## 2021-04-16 MED ORDER — METFORMIN HCL ER 750 MG PO TB24: 1500.0000 mg | ORAL_TABLET | Freq: Every day | ORAL | 1 refills | Status: DC

## 2021-04-16 MED ORDER — ESCITALOPRAM OXALATE 20 MG PO TABS: 20.0000 mg | ORAL_TABLET | Freq: Every day | ORAL | 1 refills | Status: DC

## 2021-04-16 MED ORDER — AMLODIPINE BESYLATE 2.5 MG PO TABS: 2.5000 mg | ORAL_TABLET | Freq: Every day | ORAL | 1 refills | Status: DC

## 2021-04-16 MED ORDER — OMEPRAZOLE 40 MG PO CPDR: 40.0000 mg | DELAYED_RELEASE_CAPSULE | Freq: Every day | ORAL | 1 refills | Status: DC

## 2021-04-16 MED ORDER — BUPROPION HCL ER (XL) 300 MG PO TB24: 300.0000 mg | ORAL_TABLET | Freq: Every day | ORAL | 1 refills | Status: DC

## 2021-04-16 MED ORDER — GABAPENTIN 300 MG PO CAPS: 300.0000 mg | ORAL_CAPSULE | Freq: Every day | ORAL | 1 refills | Status: DC

## 2021-04-16 MED ORDER — RYBELSUS 7 MG PO TABS: 7.0000 mg | ORAL_TABLET | Freq: Every day | ORAL | 0 refills | Status: DC

## 2021-04-16 MED ORDER — BUPROPION HCL ER (XL) 150 MG PO TB24: 150.0000 mg | ORAL_TABLET | Freq: Every day | ORAL | 1 refills | Status: DC

## 2021-05-13 DIAGNOSIS — M169 Osteoarthritis of hip, unspecified: Secondary | ICD-10-CM | POA: Diagnosis not present

## 2021-05-13 DIAGNOSIS — M545 Low back pain, unspecified: Secondary | ICD-10-CM | POA: Diagnosis not present

## 2021-05-13 DIAGNOSIS — M9904 Segmental and somatic dysfunction of sacral region: Secondary | ICD-10-CM | POA: Diagnosis not present

## 2021-05-13 DIAGNOSIS — G894 Chronic pain syndrome: Secondary | ICD-10-CM | POA: Diagnosis not present

## 2021-06-09 ENCOUNTER — Ambulatory Visit (INDEPENDENT_AMBULATORY_CARE_PROVIDER_SITE_OTHER): Payer: Medicare Other

## 2021-06-09 ENCOUNTER — Other Ambulatory Visit: Payer: Self-pay

## 2021-06-09 ENCOUNTER — Encounter (INDEPENDENT_AMBULATORY_CARE_PROVIDER_SITE_OTHER): Payer: Self-pay | Admitting: Vascular Surgery

## 2021-06-09 ENCOUNTER — Ambulatory Visit (INDEPENDENT_AMBULATORY_CARE_PROVIDER_SITE_OTHER): Payer: Medicare Other | Admitting: Vascular Surgery

## 2021-06-09 VITALS — BP 136/86 | HR 66 | Resp 16 | Wt 222.8 lb

## 2021-06-09 DIAGNOSIS — I8290 Acute embolism and thrombosis of unspecified vein: Secondary | ICD-10-CM | POA: Diagnosis not present

## 2021-06-09 DIAGNOSIS — I2510 Atherosclerotic heart disease of native coronary artery without angina pectoris: Secondary | ICD-10-CM | POA: Diagnosis not present

## 2021-06-09 DIAGNOSIS — I10 Essential (primary) hypertension: Secondary | ICD-10-CM

## 2021-06-09 DIAGNOSIS — E782 Mixed hyperlipidemia: Secondary | ICD-10-CM | POA: Diagnosis not present

## 2021-06-09 DIAGNOSIS — I82C12 Acute embolism and thrombosis of left internal jugular vein: Secondary | ICD-10-CM | POA: Diagnosis not present

## 2021-06-09 NOTE — Progress Notes (Signed)
MRN : [id number]  Jimmy Green is a 70 y.o. (07/02/1951) male who presents with chief complaint of check jugular vein.  History of Present Illness:   Patient is a 70 year old gentleman with a significant history of head neck surgery on the left side in October 2021.  This was performed at [facility] and the op note is listed in care everywhere but is not available for download.  The DVT in the left internal jugular vein was originally found after an MRI was obtained for work-up of severe headache.  Today the patient notes that his headache has resolved he has not had to follow-up with Dr. Brigitte Pulse anymore.  He denies other left-sided neck or left arm symptoms.  There is no past history of DVT.   Previous MRV is reviewed with the patient there is a filling defect in the left IJ suggestive of thrombus.  Duplex ultrasound of the jugular system obtained today demonstrates a widely patent normal left internal jugular vein.  No evidence of residual thrombus no chronic changes noted.  No outpatient medications have been marked as taking for the 06/09/21 encounter (Appointment) with Delana Meyer, Dolores Lory, MD.    Past Medical History:  Diagnosis Date   Anginal pain ([place])    Basal cell carcinoma 02/21/2018   left posterior shoulder at lat edge of scar   Basal cell carcinoma 03/11/2021   superficial at left upper back medial and left upper back lateral, nodular pattern at right mid back   Coronary artery disease    Depression    Diabetes mellitus without complication (HCC)    GERD (gastroesophageal reflux disease)    Hyperlipidemia    Hypertension    Hypogonadism in male    Metabolic syndrome    Myocardial infarction ([place])    Orthopnea    Seborrheic keratosis    Skin cancer 2014   nose and right knee   Sleep apnea     Past Surgical History:  Procedure Laterality Date   BACK SURGERY     BASAL CELL CARCINOMA EXCISION Left 12/02/2015   Chest-Done by Dermatologist    BASAL CELL CARCINOMA  EXCISION Left 02/21/2018   Nodulocytic pattern, deep margin involved - [facility] - Dr. Brendolyn Patty   BASAL CELL CARCINOMA EXCISION  02/23/2018   CARDIAC CATHETERIZATION Left 04/01/2016   Procedure: Left Heart Cath and Coronary Angiography;  Surgeon: Teodoro Spray, MD;  Location: [place] [facility];  Service: Cardiovascular;  Laterality: Left;   CARDIAC CATHETERIZATION N/A 04/01/2016   Procedure: Intravascular Pressure Wire/FFR Study;  Surgeon: Isaias Cowman, MD;  Location: [place] [facility];  Service: Cardiovascular;  Laterality: N/A;   COLONOSCOPY N/A 01/15/2015   Wohl-ileitis, 2 benign polyps, cryptitis, sigmoid diverticulosis, focal ulceration ICV   COLONOSCOPY WITH PROPOFOL N/A 08/29/2020   Procedure: COLONOSCOPY WITH PROPOFOL;  Surgeon: Lucilla Lame, MD;  Location: [facility] ENDOSCOPY;  Service: Endoscopy;  Laterality: N/A;   CORONARY STENT PLACEMENT     FRACTIONAL FLOW RESERVE WIRE  10/08/2011   Procedure: FRACTIONAL FLOW RESERVE WIRE;  Surgeon: Clent Demark, MD;  Location: [place] CATH LAB;  Service: Cardiovascular;;   HERNIA REPAIR     KNEE SURGERY     LEFT HEART CATHETERIZATION WITH CORONARY ANGIOGRAM N/A 10/08/2011   Procedure: LEFT HEART CATHETERIZATION WITH CORONARY ANGIOGRAM;  Surgeon: Clent Demark, MD;  Location: [place] CATH LAB;  Service: Cardiovascular;  Laterality: N/A;   parodectomy Left 05/07/2020   SKIN CANCER EXCISION  01/2018     Dermatology   TOTAL HIP ARTHROPLASTY Left 04/06/2017   Procedure: TOTAL HIP ARTHROPLASTY ANTERIOR APPROACH;  Surgeon: Hessie Knows, MD;  Location: [facility] ORS;  Service: Orthopedics;  Laterality: Left;    Social History Social History   Tobacco Use   Smoking status: Former    Years: 40.00    Types: Cigarettes    Quit date: 08/03/2012    Years since quitting: 8.8   Smokeless tobacco: Never  Vaping Use   Vaping Use: Never used  Substance Use Topics   Alcohol use: No    Alcohol/week: 0.0 standard drinks   Drug use: No     Family History Family History  Problem Relation Age of Onset   Lung cancer Father    Heart disease Father    Skin cancer Father     Allergies  Allergen Reactions   Shingrix [Zoster Vac Recomb Adjuvanted]     Rash    Elavil [Amitriptyline Hcl] Rash   Tape Rash    Paper Tape     REVIEW OF SYSTEMS (Negative unless checked)  Constitutional: [] Weight loss  [] Fever  [] Chills Cardiac: [] Chest pain   [] Chest pressure   [] Palpitations   [] Shortness of breath when laying flat   [] Shortness of breath with exertion. Vascular:  [] Pain in legs with walking   [] Pain in legs at rest  [] History of DVT   [] Phlebitis   [] Swelling in legs   [] Varicose veins   [] Non-healing ulcers Pulmonary:   [] Uses home oxygen   [] Productive cough   [] Hemoptysis   [] Wheeze  [] COPD   [] Asthma Neurologic:  [] Dizziness   [] Seizures   [] History of stroke   [] History of TIA  [] Aphasia   [] Vissual changes   [] Weakness or numbness in arm   [] Weakness or numbness in leg Musculoskeletal:   [] Joint swelling   [] Joint pain   [] Low back pain Hematologic:  [] Easy bruising  [] Easy bleeding   [] Hypercoagulable state   [] Anemic Gastrointestinal:  [] Diarrhea   [] Vomiting  [] Gastroesophageal reflux/heartburn   [] Difficulty swallowing. Genitourinary:  [] Chronic kidney disease   [] Difficult urination  [] Frequent urination   [] Blood in urine Skin:  [] Rashes   [] Ulcers  Psychological:  [] History of anxiety   []  History of major depression.  Physical Examination  There were no vitals filed for this visit. There is no height or weight on file to calculate BMI. Gen: WD/WN, NAD Head: [place]/AT, No temporalis wasting.  Ear/Nose/Throat: Hearing grossly intact, nares w/o erythema or drainage, pinna without lesions Eyes: PER, EOMI, sclera nonicteric.  Neck: Supple, no gross masses.  No JVD.  Pulmonary:  Good air movement, no audible wheezing, no use of accessory muscles.  Cardiac: RRR, precordium not hyperdynamic. Vascular: Left neck  and left arm no evidence of swelling no palpable nodules.  Well-healed incisional scar on the left neck from his previous surgery. Vessel Right Left  Radial Palpable Palpable  Gastrointestinal: soft, non-distended. No guarding/no peritoneal signs.  Musculoskeletal: M/S 5/5 throughout.  No deformity.  Neurologic: CN 2-12 intact. Pain and light touch intact in extremities.  Symmetrical.  Speech is fluent. Motor exam as listed above. Psychiatric: Judgment intact, Mood & affect appropriate for pt's clinical situation. Dermatologic: Venous rashes no ulcers noted.  No changes consistent with cellulitis. Lymph : No lichenification or skin changes of chronic lymphedema.  CBC Lab Results  Component Value Date   WBC 6.8 11/13/2020   HGB 15.7 11/13/2020   HCT 47.1 11/13/2020   MCV 90.6 11/13/2020   PLT 264 11/13/2020  BMET    Component Value Date/Time   NA 138 11/13/2020 0000   NA 140 06/07/2015 0935   K 4.3 11/13/2020 0000   CL 101 11/13/2020 0000   CO2 29 11/13/2020 0000   GLUCOSE 216 (H) 11/13/2020 0000   BUN 10 11/13/2020 0000   BUN 8 06/07/2015 0935   CREATININE 0.60 (L) 11/13/2020 0000   CALCIUM 9.5 11/13/2020 0000   GFRNONAA 103 11/13/2020 0000   GFRAA 120 11/13/2020 0000   CrCl cannot be calculated (Patient's most recent lab result is older than the maximum 21 days allowed.).  COAG Lab Results  Component Value Date   INR 1.15 03/31/2017   INR 1.07 10/01/2011   INR 1.2 02/25/2008    Radiology No results found.   Assessment/Plan 1. Internal jugular vein thrombosis, left (HCC) Patient's perioperative thrombus has resolved.  There is no residual or chronic changes noted.  He is okay to stop his Eliquis.  He will follow-up with his primary care regarding any future headaches as I do not believe that the jugular thrombus had a significant part in this but was an incidental finding.  Nevertheless I have asked that if he does have a return of arm swelling or facial swelling  or neck pain or headaches that he give [place] a call and we would certainly want to do an ultrasound to ensure that there is no recurrence of the thrombus.  He agrees with this plan and will follow up as needed.  2. Hypertension goal BP (blood pressure) < 140/90 Continue antihypertensive medications as already ordered, these medications have been reviewed and there are no changes at this time.   3. CAD in native artery Continue cardiac and antihypertensive medications as already ordered and reviewed, no changes at this time.  Continue statin as ordered and reviewed, no changes at this time  Nitrates PRN for chest pain   4. Mixed hyperlipidemia Continue statin as ordered and reviewed, no changes at this time     Hortencia Pilar, MD  06/09/2021 9:56 AM

## 2021-06-10 DIAGNOSIS — G894 Chronic pain syndrome: Secondary | ICD-10-CM | POA: Diagnosis not present

## 2021-06-10 DIAGNOSIS — M5136 Other intervertebral disc degeneration, lumbar region: Secondary | ICD-10-CM | POA: Diagnosis not present

## 2021-06-10 DIAGNOSIS — M169 Osteoarthritis of hip, unspecified: Secondary | ICD-10-CM | POA: Diagnosis not present

## 2021-06-10 DIAGNOSIS — M545 Low back pain, unspecified: Secondary | ICD-10-CM | POA: Diagnosis not present

## 2021-06-17 ENCOUNTER — Ambulatory Visit (INDEPENDENT_AMBULATORY_CARE_PROVIDER_SITE_OTHER): Payer: Medicare Other | Admitting: Dermatology

## 2021-06-17 ENCOUNTER — Other Ambulatory Visit: Payer: Self-pay

## 2021-06-17 ENCOUNTER — Encounter: Payer: Self-pay | Admitting: Dermatology

## 2021-06-17 DIAGNOSIS — L578 Other skin changes due to chronic exposure to nonionizing radiation: Secondary | ICD-10-CM | POA: Diagnosis not present

## 2021-06-17 DIAGNOSIS — D040 Carcinoma in situ of skin of lip: Secondary | ICD-10-CM | POA: Diagnosis not present

## 2021-06-17 DIAGNOSIS — D489 Neoplasm of uncertain behavior, unspecified: Secondary | ICD-10-CM

## 2021-06-17 DIAGNOSIS — Z85828 Personal history of other malignant neoplasm of skin: Secondary | ICD-10-CM

## 2021-06-17 DIAGNOSIS — L905 Scar conditions and fibrosis of skin: Secondary | ICD-10-CM

## 2021-06-17 DIAGNOSIS — Z86018 Personal history of other benign neoplasm: Secondary | ICD-10-CM | POA: Diagnosis not present

## 2021-06-17 DIAGNOSIS — D1800 Hemangioma unspecified site: Secondary | ICD-10-CM | POA: Diagnosis not present

## 2021-06-17 DIAGNOSIS — D229 Melanocytic nevi, unspecified: Secondary | ICD-10-CM

## 2021-06-17 DIAGNOSIS — L820 Inflamed seborrheic keratosis: Secondary | ICD-10-CM

## 2021-06-17 DIAGNOSIS — L814 Other melanin hyperpigmentation: Secondary | ICD-10-CM | POA: Diagnosis not present

## 2021-06-17 DIAGNOSIS — C4492 Squamous cell carcinoma of skin, unspecified: Secondary | ICD-10-CM

## 2021-06-17 DIAGNOSIS — C44619 Basal cell carcinoma of skin of left upper limb, including shoulder: Secondary | ICD-10-CM

## 2021-06-17 DIAGNOSIS — L821 Other seborrheic keratosis: Secondary | ICD-10-CM

## 2021-06-17 DIAGNOSIS — C44519 Basal cell carcinoma of skin of other part of trunk: Secondary | ICD-10-CM | POA: Diagnosis not present

## 2021-06-17 HISTORY — DX: Squamous cell carcinoma of skin, unspecified: C44.92

## 2021-06-17 NOTE — Progress Notes (Signed)
Follow-Up Visit   Subjective  Jimmy Green is a 70 y.o. male who presents for the following: Follow-up (Patient here today for 3 month ak follow up. Reports a spot at left lower lip that will not heal. He states he bit his lip about 2 years ago and will not heal. He also reports a place at right ear he would like checked. He denies other concerns at this time. ). Spot by ear gets irritated.  He also had 3 BCCs biopsied on back couple months ago but not yet treated.  He has h/o BCC L posterior shoulder excised by Dr. Fleet Contras.   The following portions of the chart were reviewed this encounter and updated as appropriate:      Review of Systems: No other skin or systemic complaints except as noted in HPI or Assessment and Plan.   Objective  Well appearing patient in no apparent distress; mood and affect are within normal limits.  A focused examination was performed including upper extremities, including the arms, hands, fingers, and fingernails. Relevant physical exam findings are noted in the Assessment and Plan.  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  left spinal upper back 9 mm pink white depressed macule   Left Upper Back Medial Small area of pinkness inferior to biopsy site   Assessment & Plan  Inflamed seborrheic keratosis right postauricular crease x 1  Will recheck at next follow up  Destruction of lesion - right postauricular crease x 1  Destruction method: cryotherapy   Informed consent: discussed and consent obtained   Lesion destroyed using liquid nitrogen: Yes   Region frozen until ice ball extended beyond lesion: Yes   Outcome: patient tolerated procedure well with no complications   Post-procedure details: wound care instructions given   Additional details:  Prior to procedure, discussed  risks of blister formation, small wound, skin dyspigmentation, or rare scar following cryotherapy. Recommend Vaseline ointment to treated areas while healing.   Neoplasm of uncertain behavior left lower lip  Skin / nail biopsy Type of biopsy: tangential   Informed consent: discussed and consent obtained   Patient was prepped and draped in usual sterile fashion: Area prepped with alcohol. 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  (Right mid back and L upper back lateral appear clear with biopsy only)      Seborrheic Keratoses - Stuck-on, waxy, tan-brown papules and/or plaques  - Benign-appearing - Discussed benign etiology and prognosis. - Observe - Call for any  changes  Lentigines - Scattered tan macules - Due to sun exposure - Benign-appering, observe - Recommend daily broad spectrum sunscreen SPF 30+ to sun-exposed areas, reapply every 2 hours as needed. - Call for any changes  Hemangiomas - Red papules - Discussed benign nature - Observe - Call for any changes  Melanocytic Nevi - Tan-brown and/or  pink-flesh-colored symmetric macules and papules - Benign appearing on exam today - Observation - Call clinic for new or changing moles - Recommend daily use of broad spectrum spf 30+ sunscreen to sun-exposed areas.   Actinic Damage - chronic, secondary to cumulative UV radiation exposure/sun exposure over time - diffuse scaly erythematous macules with underlying dyspigmentation - Recommend daily broad spectrum sunscreen SPF 30+ to sun-exposed areas, reapply every 2 hours as needed.  - Recommend staying in the shade or wearing long sleeves, sun glasses (UVA+UVB protection) and wide brim hats (4-inch brim around the entire circumference of the hat). - Call for new or changing lesions.  History of Basal Cell Carcinoma of the Skin - No evidence of recurrence today, L post shoulder;  right mid back and left upper back lateral clear with biopsy only. Recommend regular full body skin exams - Recommend daily broad spectrum sunscreen SPF 30+ to sun-exposed areas, reapply every 2 hours as needed.  - Call if any new or changing lesions are noted between office visits  Return for 3 week follow up ed&c follow biopsy on lip and recheck isk at ear . I, Ruthell Rummage, CMA, am acting as scribe for Brendolyn Patty, MD.  Documentation: I have reviewed the above documentation for accuracy and completeness, and I agree with the above.  Brendolyn Patty MD

## 2021-06-17 NOTE — Patient Instructions (Signed)
Biopsy Wound Care Instructions  Leave the original bandage on for 24 hours if possible.  If the bandage becomes soaked or soiled before that time, it is OK to remove it and examine the wound.  A small amount of post-operative bleeding is normal.  If excessive bleeding occurs, remove the bandage, place gauze over the site and apply continuous pressure (no peeking) over the area for 30 minutes. If this does not work, please call our clinic as soon as possible or page your doctor if it is after hours.   Once a day, cleanse the wound with soap and water. It is fine to shower. If a thick crust develops you may use a Q-tip dipped into dilute hydrogen peroxide (mix 1:1 with water) to dissolve it.  Hydrogen peroxide can slow the healing process, so use it only as needed.    After washing, apply petroleum jelly (Vaseline) or an antibiotic ointment if your doctor prescribed one for you, followed by a bandage.    For best healing, the wound should be covered with a layer of ointment at all times. If you are not able to keep the area covered with a bandage to hold the ointment in place, this may mean re-applying the ointment several times a day.  Continue this wound care until the wound has healed and is no longer open.   Itching and mild discomfort is normal during the healing process. However, if you develop pain or severe itching, please call our office.   If you have any discomfort, you can take Tylenol (acetaminophen) or ibuprofen as directed on the bottle. (Please do not take these if you have an allergy to them or cannot take them for another reason).  Some redness, tenderness and white or yellow material in the wound is normal healing.  If the area becomes very sore and red, or develops a thick yellow-green material (pus), it may be infected; please notify us.    If you have stitches, return to clinic as directed to have the stitches removed. You will continue wound care for 2-3 days after the stitches  are removed.   Wound healing continues for up to one year following surgery. It is not unusual to experience pain in the scar from time to time during the interval.  If the pain becomes severe or the scar thickens, you should notify the office.    A slight amount of redness in a scar is expected for the first six months.  After six months, the redness will fade and the scar will soften and fade.  The color difference becomes less noticeable with time.  If there are any problems, return for a post-op surgery check at your earliest convenience.  To improve the appearance of the scar, you can use silicone scar gel, cream, or sheets (such as Mederma or Serica) every night for up to one year. These are available over the counter (without a prescription).  Please call our office at [phone] for any questions or concerns.   Seborrheic Keratosis  What causes seborrheic keratoses? Seborrheic keratoses are harmless, common skin growths that first appear during adult life.  As time goes by, more growths appear.  Some people may develop a large number of them.  Seborrheic keratoses appear on both covered and uncovered body parts.  They are not caused by sunlight.  The tendency to develop seborrheic keratoses can be inherited.  They vary in color from skin-colored to gray, brown, or even black.  They can be  either smooth or have a rough, warty surface.   Seborrheic keratoses are superficial and look as if they were stuck on the skin.  Under the microscope this type of keratosis looks like layers upon layers of skin.  That is why at times the top layer may seem to fall off, but the rest of the growth remains and re-grows.    Treatment Seborrheic keratoses do not need to be treated, but can easily be removed in the office.  Seborrheic keratoses often cause symptoms when they rub on clothing or jewelry.  Lesions can be in the way of shaving.  If they become inflamed, they can cause itching, soreness, or  burning.  Removal of a seborrheic keratosis can be accomplished by freezing, burning, or surgery. If any spot bleeds, scabs, or grows rapidly, please return to have it checked, as these can be an indication of a skin cancer.  Cryotherapy Aftercare  Wash gently with soap and water everyday.   Apply Vaseline and Band-Aid daily until healed.   Melanoma ABCDEs  Melanoma is the most dangerous type of skin cancer, and is the leading cause of death from skin disease.  You are more likely to develop melanoma if you: Have light-colored skin, light-colored eyes, or red or blond hair Spend a lot of time in the sun Tan regularly, either outdoors or in a tanning bed Have had blistering sunburns, especially during childhood Have a close family member who has had a melanoma Have atypical moles or large birthmarks  Early detection of melanoma is key since treatment is typically straightforward and cure rates are extremely high if we catch it early.   The first sign of melanoma is often a change in a mole or a new dark spot.  The ABCDE system is a way of remembering the signs of melanoma.  A for asymmetry:  The two halves do not match. B for border:  The edges of the growth are irregular. C for color:  A mixture of colors are present instead of an even brown color. D for diameter:  Melanomas are usually (but not always) greater than 28mm - the size of a pencil eraser. E for evolution:  The spot keeps changing in size, shape, and color.  Please check your skin once per month between visits. You can use a small mirror in front and a large mirror behind you to keep an eye on the back side or your body.   If you see any new or changing lesions before your next follow-up, please call to schedule a visit.  Please continue daily skin protection including broad spectrum sunscreen SPF 30+ to sun-exposed areas, reapplying every 2 hours as needed when you're outdoors.    If you have any questions or concerns  for your doctor, please call our main line at [phone] and press option 4 to reach your doctor's medical assistant. If no one answers, please leave a voicemail as directed and we will return your call as soon as possible. Messages left after 4 pm will be answered the following business day.   You may also send [place] a message via [place]. We typically respond to MyChart messages within 1-2 business days.  For prescription refills, please ask your pharmacy to contact our office. Our fax number is [phone].  If you have an urgent issue when the clinic is closed that cannot wait until the next business day, you can page your doctor at the number below.    Please note that  while we do our best to be available for urgent issues outside of office hours, we are not available 24/7.   If you have an urgent issue and are unable to reach [place], you may choose to seek medical care at your doctor's office, retail clinic, urgent care center, or emergency room.  If you have a medical emergency, please immediately call 911 or go to the emergency department.  Pager Numbers  - Dr. Nehemiah Massed: [phone]  - Dr. Laurence Ferrari: [phone]  - Dr. Nicole Kindred: [phone]  In the event of inclement weather, please call our main line at [phone] for an update on the status of any delays or closures.  Dermatology Medication Tips: Please keep the boxes that topical medications come in in order to help keep track of the instructions about where and how to use these. Pharmacies typically print the medication instructions only on the boxes and not directly on the medication tubes.   If your medication is too expensive, please contact our office at [phone] option 4 or send [place] a message through [place].   We are unable to tell what your co-pay for medications will be in advance as this is different depending on your insurance coverage. However, we may be able to find a substitute medication at lower cost or fill out  paperwork to get insurance to cover a needed medication.   If a prior authorization is required to get your medication covered by your insurance company, please allow [place] 1-2 business days to complete this process.  Drug prices often vary depending on where the prescription is filled and some pharmacies may offer cheaper prices.  The website www.goodrx.com contains coupons for medications through different pharmacies. The prices here do not account for what the cost may be with help from insurance (it may be cheaper with your insurance), but the website can give you the price if you did not use any insurance.  - You can print the associated coupon and take it with your prescription to the pharmacy.  - You may also stop by our office during regular business hours and pick up a GoodRx coupon card.  - If you need your prescription sent electronically to a different pharmacy, notify our office through [facility] or by phone at [phone] option 4.

## 2021-06-23 ENCOUNTER — Telehealth: Payer: Self-pay

## 2021-06-23 NOTE — Telephone Encounter (Signed)
Left message for patient to call for biopsy results. 

## 2021-06-23 NOTE — Telephone Encounter (Signed)
Patient advised and already scheduled for 12/14. aw

## 2021-06-23 NOTE — Telephone Encounter (Signed)
-----   Message from Brendolyn Patty, MD sent at 06/19/2021  4:35 PM EST ----- Skin , left lower lip SQUAMOUS CELL CARCINOMA IN SITU, BASE INVOLVED, ULCERATED  SCCIS skin cancer, he needs appointment for additional shave removal to make sure all of it is gone.  And then possible f/up treatment with 5FU cream after it has healed.   - please call patient

## 2021-07-08 DIAGNOSIS — G894 Chronic pain syndrome: Secondary | ICD-10-CM | POA: Diagnosis not present

## 2021-07-16 ENCOUNTER — Encounter: Payer: Self-pay | Admitting: Dermatology

## 2021-07-16 ENCOUNTER — Ambulatory Visit (INDEPENDENT_AMBULATORY_CARE_PROVIDER_SITE_OTHER): Payer: Medicare Other | Admitting: Dermatology

## 2021-07-16 ENCOUNTER — Other Ambulatory Visit: Payer: Self-pay

## 2021-07-16 DIAGNOSIS — L905 Scar conditions and fibrosis of skin: Secondary | ICD-10-CM

## 2021-07-16 DIAGNOSIS — L578 Other skin changes due to chronic exposure to nonionizing radiation: Secondary | ICD-10-CM

## 2021-07-16 DIAGNOSIS — L219 Seborrheic dermatitis, unspecified: Secondary | ICD-10-CM

## 2021-07-16 DIAGNOSIS — D040 Carcinoma in situ of skin of lip: Secondary | ICD-10-CM | POA: Diagnosis not present

## 2021-07-16 DIAGNOSIS — D099 Carcinoma in situ, unspecified: Secondary | ICD-10-CM

## 2021-07-16 DIAGNOSIS — C44519 Basal cell carcinoma of skin of other part of trunk: Secondary | ICD-10-CM | POA: Diagnosis not present

## 2021-07-16 DIAGNOSIS — L719 Rosacea, unspecified: Secondary | ICD-10-CM

## 2021-07-16 DIAGNOSIS — Z85828 Personal history of other malignant neoplasm of skin: Secondary | ICD-10-CM | POA: Diagnosis not present

## 2021-07-16 MED ORDER — KETOCONAZOLE 2 % EX CREA
TOPICAL_CREAM | CUTANEOUS | 2 refills | Status: DC
Start: 2021-07-16 — End: 2022-09-15

## 2021-07-16 MED ORDER — HYDROCORTISONE 2.5 % EX CREA: TOPICAL_CREAM | CUTANEOUS | 2 refills | Status: DC

## 2021-07-16 MED ORDER — METRONIDAZOLE 0.75 % EX GEL: CUTANEOUS | 5 refills | Status: DC

## 2021-07-16 NOTE — Progress Notes (Signed)
Follow-Up Visit   Subjective  Jimmy Green is a 70 y.o. male who presents for the following: Follow-up.  Patient here for 3 week follow-up. He has a biopsy proven BCC of the left upper back medial that needs EDC. Also, left lower lip with biopsy proven SCC in situ that needs treatment with shave removal +/- 5FU Cream. Recheck ISK treated on the R post auricular crease and check scar on the L spinal upper back. Patient requests refill of metronidazole 0.75% gel for rosacea of the face.   The following portions of the chart were reviewed this encounter and updated as appropriate:       Review of Systems:  No other skin or systemic complaints except as noted in HPI or Assessment and Plan.  Objective  Well appearing patient in no apparent distress; mood and affect are within normal limits.  A focused examination was performed including face, back. Relevant physical exam findings are noted in the Assessment and Plan.  L upper back medial Light pink white scar.       Left lower lip Pink scar.  L spinal upper back 9 mm pink white depressed macule        post auricular bil Pink patches with greasy scale.   face Mild erythema with telangiectasias of the nose and cheeks.   Assessment & Plan  Actinic Damage - chronic, secondary to cumulative UV radiation exposure/sun exposure over time - diffuse scaly erythematous macules with underlying dyspigmentation - Recommend daily broad spectrum sunscreen SPF 30+ to sun-exposed areas, reapply every 2 hours as needed.  - Recommend staying in the shade or wearing long sleeves, sun glasses (UVA+UVB protection) and wide brim hats (4-inch brim around the entire circumference of the hat). - Call for new or changing lesions. History of Basal Cell Carcinoma of the Skin - No evidence of recurrence today of the right mid back and left upper back lateral. Clear with biopsy.  - Recommend regular full body skin exams - Recommend daily  broad spectrum sunscreen SPF 30+ to sun-exposed areas, reapply every 2 hours as needed.  - Call if any new or changing lesions are noted between office visits  Basal cell carcinoma of skin of trunk L upper back medial  BASAL CELL CARCINOMA, SUPERFICIAL AND NODULAR PATTERNS, BASE INVOLVED, biopsy proven.  Clear today post biopsy only. Observe for recurrence.   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  Patient prefers to observe for now. Discussed risk of recurrence. Pt understands risks.  Scar L spinal upper back  Benign-appearing. Observation. No change from previous exam.   Seborrheic dermatitis post auricular bil  With flare Seborrheic Dermatitis  -  is a chronic persistent rash characterized by pinkness and scaling most commonly of the mid face but also can occur on the scalp (dandruff), ears; mid chest, mid back and groin.  It tends to be exacerbated by stress and cooler weather.  People who have neurologic disease may experience new onset or exacerbation of existing seborrheic dermatitis.  The condition is not curable but treatable and can be controlled.  Start hydrocortisone 2.5% cream Apply qd/bid as directed dsp 30g 2Rf.  Start ketoconazole 2% cream Apply qd/bid as directed dsp 30g 2Rf.  hydrocortisone 2.5 % cream - post auricular bil Apply to rash behind ears 1-2 times a day as directed.  ketoconazole (NIZORAL)  2 % cream - post auricular bil Apply to rash behind ears 1-2 times a day as directed.  Rosacea face  Improving Rosacea is a chronic progressive skin condition usually affecting the face of adults, causing redness and/or acne bumps. It is treatable but not curable. It sometimes affects the eyes (ocular rosacea) as well. It may respond to topical and/or systemic medication and can flare with stress, sun  exposure, alcohol, exercise and some foods.  Daily application of broad spectrum spf 30+ sunscreen to face is recommended to reduce flares.  Continue metronidazole 0.75% gel qd/bid dsp 45g 5Rf.  metroNIDAZOLE (METROGEL) 0.75 % gel - face Apply to face 1-2 times a day for roscea.  Return in about 3 months (around 10/14/2021) for UBSE, recheck SCC site on lip and BCC sites on back.  IJamesetta Orleans, CMA, am acting as scribe for Brendolyn Patty, MD .  Documentation: I have reviewed the above documentation for accuracy and completeness, and I agree with the above.  Brendolyn Patty MD

## 2021-07-16 NOTE — Patient Instructions (Addendum)
Seborrheic Dermatitis  Mix hydrocortisone with ketaconazole 2% twice a day. If improved, decrease to hydrocortisone and ketaconazole mixed once a day. If still clear, decrease to ketaconazole only.   Seborrheic Dermatitis  What is seborrheic dermatitis? Seborrheic (say: seb-oh-ree-ick) dermatitis is a disease that causes flaking of the skin.  It usually affects the scalp.  In teenagers and adults, it is commonly called dandruff.  In infants, it is referred to as cradle cap.  Dandruff often appears as scaling on the scalp with or without redness.  On other parts of the body, seborrheic dermatitis tends to produce both redness and scaling.  Other common locations of seborrheic dermatitis include the central face, eyebrows, chest, and the creases of the arms, legs, and groin.  It often causes the skin to look a little greasy, scaly, or flaky. Seborrheic dermatitis can occur at any age.  It often comes and goes and may to be seasonally related, especially in the Northern climates.  What causes seborrheic dermatitis? The exact cause is not known, though yeast of the Malassezia species may be involved.  This organism is normally present on the skin in small numbers, but sometimes its numbers increase, especially in oily skin.  Treatments that reduce the yeast tend to improve seborrheic dermatitis.  How is seborrheic dermatitis treated? The treatment of seborrheic dermatitis depends on its location on the body and the persons age. Seborrheic dermatitis of the scalp (dandruff) in adults and teenagers is usually treated with a medicated shampoo.  Here is a list of the medications that help, and the over-counter shampoos that contain them: Salicylic acid (Neutrogena T/Sal, Sebulex, Scalpicin, Denorex Extra Strength) Zinc pyrithione (Head & Shoulders white bottle, Denorex Daily, DHS Zinc, Pantene Pro-V Pyrithione Zinc) Selenium sulfide (Head & Shoulders blue bottle, Selsun Blue, Exsel Lotion Shampoo,  Glo-Sel) Yahoo tar (Neutrogena T/Gal, Pentrax, Zetar, Tegrin, Viacom, Therapeutic Denorex) Ketoconazole (Nizoral)  If you have dandruff, you might start by using one of these shampoos every day until your dandruff is controlled and then keep using it at least twice a week.  Often times your doctor will recommend a rotation of several different medicated shampoos as some will experience a plateau in the effectiveness of any one shampoo.   When you use a dandruff shampoo, rub the shampoo into your wet hair and massage into scalp thoroughly.  Let it stay on your hair and scalp for 5 minutes before rinsing.  If you have involvement in the eyebrows or face, you can lather those areas with the medicated shampoo as well, or use a medicated soap (ZNP-bar, Polytar Soap, SAStid, or sulfur soap).    If the wash or shampoo alone does not help, your doctor might want you to use a prescription medication once or twice a day.  Leave-in medications for the scalp are best applied by massaging into the scalp immediately after towel drying your hair, but may be applied even if you have not washed your hair.  Seborrheic dermatitis in infants usually clears up by age 23 -64 months.  It may develop in the diaper area where it might be confused with diaper rash.  For milder cases you can try gently brushing out scales with a soft brush.  This is best done immediately after washing with a non-medicated baby shampoo Wynetta Emery and Royce Macadamia, etc.).  Your doctor may recommend a medicated shampoo or a prescription topical medication.

## 2021-08-11 NOTE — Progress Notes (Signed)
Name: Jimmy Green   MRN: [id number]    DOB: [birth year]   Date:08/12/2021       Progress Note  Subjective  Chief Complaint  Follow Up  HPI  HTN: bp is at goal, he is taking Benicar hctz 40/25 and has noticed increase in urinary frequency but able to tolerate side effects. He also takes metoprolol. He denies chest pain or palpitation at this time   DM: he has not been checking his glucose lately, A1C back in August  was 8.6 % today is down to 7.1 % , he is only on Farxiga and Metformin since he could not tolerate Rybelsus  because he had nausea and palpitation. He has associated obesity, dyslipidemia, HTN. He is on statin therapy and Benicar . He is not sure why his glucose is better.   Depression Major Recurrent: symptoms started when he lost his job in 2016, he has been on Wellbutrin 300  mg and Lexapro 20 mg daily. He has been feeling well, phq 9 is normal, he stopped the Wellbutrin 150 mg on his own and is doing okay at this time.   CAD with Angina: medically managed, on beta blocker and statin therapy , no pain at this time , denies SOB. He will see cardiologist, used to see Dr. Ubaldo Glassing but her recently retired   History of left internal jugular vein thrombosis: he was seen by vascular surgeon and took aspirin and Eliquis for 3 months, repeat US was clear and he has been off blood thinners since Nov 2022. Thrombus secondary to left parotid surgery  Headaches with migraine features: usually above right eye, throbbing like and aching, he has been doing well on Aimovig once a month and states headache well controlled with medication, usually has an episode two days prior to monthly dose but resolves with medication.   Morbid Obesity: BMI is over 35 with co-morbidities. He has HTN, DM, CAD. Discussed life style modification again   Patient Active Problem List   Diagnosis Date Noted   Morbid obesity ([place]) 08/12/2021   Internal jugular vein thrombosis, left ([place]) 03/12/2021   History of DVT  in adulthood 03/03/2021   Diabetes ([place]) 03/03/2021   Personal history of colonic polyps    Polyp of transverse colon    BPH with obstruction/lower urinary tract symptoms 05/20/2018   History of basal cell carcinoma 03/23/2018   Primary localized osteoarthritis of left hip 04/06/2017   Dyslipidemia associated with type 2 diabetes mellitus ([place]) 10/04/2016   Trigger thumb of both thumbs 06/02/2016   Chronic tension-type headache, intractable 09/25/2015   Benign paroxysmal positional vertigo due to bilateral vestibular disorder 09/25/2015   Hearing loss sensory, bilateral 08/26/2015   Cerebral microvascular disease 95/32/0233   Metabolic syndrome 43/56/8616   Vertigo 06/21/2015   Buzzing in ear 06/21/2015   Hypertension goal BP (blood pressure) < 140/90 03/28/2015   GERD (gastroesophageal reflux disease) 03/28/2015   Hyperlipidemia 03/28/2015   Depression, major 03/28/2015   CAD in native artery 03/28/2015   Actinic keratosis 02/14/2015   Obstructive sleep apnea 02/03/2015   Diverticulosis of colon without hemorrhage 01/31/2015   Hx of colonic polyps    Benign neoplasm of descending colon    Benign neoplasm of sigmoid colon    Idiopathic colitis    Sebaceous cyst 09/14/2014    Past Surgical History:  Procedure Laterality Date   BACK SURGERY     BASAL CELL CARCINOMA EXCISION Left 12/02/2015   Chest-Done by Dermatologist    BASAL  CELL CARCINOMA EXCISION Left 02/21/2018   Nodulocytic pattern, deep margin involved - [facility] - Dr. Brendolyn Patty   BASAL CELL CARCINOMA EXCISION  02/23/2018   CARDIAC CATHETERIZATION Left 04/01/2016   Procedure: Left Heart Cath and Coronary Angiography;  Surgeon: Teodoro Spray, MD;  Location: [facility];  Service: Cardiovascular;  Laterality: Left;   CARDIAC CATHETERIZATION N/A 04/01/2016   Procedure: Intravascular Pressure Wire/FFR Study;  Surgeon: Isaias Cowman, MD;  Location: [place] [facility];  Service: Cardiovascular;   Laterality: N/A;   COLONOSCOPY N/A 01/15/2015   Wohl-ileitis, 2 benign polyps, cryptitis, sigmoid diverticulosis, focal ulceration ICV   COLONOSCOPY WITH PROPOFOL N/A 08/29/2020   Procedure: COLONOSCOPY WITH PROPOFOL;  Surgeon: Lucilla Lame, MD;  Location: [facility] ENDOSCOPY;  Service: Endoscopy;  Laterality: N/A;   CORONARY STENT PLACEMENT     FRACTIONAL FLOW RESERVE WIRE  10/08/2011   Procedure: FRACTIONAL FLOW RESERVE WIRE;  Surgeon: Clent Demark, MD;  Location: [place] [facility];  Service: Cardiovascular;;   HERNIA REPAIR     KNEE SURGERY     LEFT HEART CATHETERIZATION WITH CORONARY ANGIOGRAM N/A 10/08/2011   Procedure: LEFT HEART CATHETERIZATION WITH CORONARY ANGIOGRAM;  Surgeon: Clent Demark, MD;  Location: [place] [facility];  Service: Cardiovascular;  Laterality: N/A;   parodectomy Left 05/07/2020   SKIN CANCER EXCISION  01/2018   [facility]   TOTAL HIP ARTHROPLASTY Left 04/06/2017   Procedure: TOTAL HIP ARTHROPLASTY ANTERIOR APPROACH;  Surgeon: Hessie Knows, MD;  Location: [facility] ORS;  Service: Orthopedics;  Laterality: Left;    Family History  Problem Relation Age of Onset   Lung cancer Father    Heart disease Father    Skin cancer Father     Social History   Tobacco Use   Smoking status: Former    Years: 40.00    Types: Cigarettes    Quit date: 08/03/2012    Years since quitting: 9.0   Smokeless tobacco: Never  Substance Use Topics   Alcohol use: No    Alcohol/week: 0.0 standard drinks     Current Outpatient Medications:    aspirin EC 81 MG tablet, Take 81 mg by mouth every morning. , Disp: , Rfl:    gabapentin (NEURONTIN) 300 MG capsule, Take 1 capsule (300 mg total) by mouth at bedtime., Disp: 90 capsule, Rfl: 1   hydrocortisone 2.5 % cream, Apply to rash behind ears 1-2 times a day as directed., Disp: 30 g, Rfl: 2   ketoconazole (NIZORAL) 2 % cream, Apply to rash behind ears 1-2 times a day as directed., Disp: 30 g, Rfl: 2   lidocaine (LIDODERM) 5 %, SMARTSIG:Topical,  Disp: , Rfl:    metroNIDAZOLE (METROGEL) 0.75 % gel, Apply to face 1-2 times a day for roscea., Disp: 45 g, Rfl: 5   NARCAN 4 MG/0.1ML LIQD nasal spray kit, , Disp: , Rfl: 0   nitroGLYCERIN (NITROSTAT) 0.4 MG SL tablet, DISSOLVE 1 TABLET UNDER THE TONGUE EVERY 5 MINUTES FOR UP TO 3 DOSES AS NEEDED FOR CHEST PAIN. IF NO RELIEF AFTER 3 DOSES, CALL 911 OR GO TO ER., Disp: 25 tablet, Rfl: 0   omeprazole (PRILOSEC) 40 MG capsule, Take 1 capsule (40 mg total) by mouth daily., Disp: 90 capsule, Rfl: 1   oxyCODONE (ROXICODONE) 15 MG immediate release tablet, Take 1 tablet by mouth 5 (five) times daily as needed., Disp: , Rfl:    PREVIDENT 5000 SENSITIVE 1.1-5 % PSTE, Apply 1 each topically 2 (two) times daily.,  Disp: , Rfl: 5   Accu-Chek FastClix Lancets MISC, USE AS INSTRUCTED ONCE DAILY (Patient not taking: Reported on 08/12/2021), Disp: 102 each, Rfl: 12   ACCU-CHEK GUIDE test strip, USE AS INSTRUCTED ONCE DAILY (Patient not taking: Reported on 08/12/2021), Disp: 100 strip, Rfl: 12   amLODipine (NORVASC) 2.5 MG tablet, Take 1 tablet (2.5 mg total) by mouth daily., Disp: 90 tablet, Rfl: 1   buPROPion (WELLBUTRIN XL) 300 MG 24 hr tablet, Take 1 tablet (300 mg total) by mouth daily., Disp: 90 tablet, Rfl: 1   dapagliflozin propanediol (FARXIGA) 10 MG TABS tablet, Take 1 tablet (10 mg total) by mouth daily before breakfast., Disp: 90 tablet, Rfl: 1   Erenumab-aooe (AIMOVIG) 140 MG/ML SOAJ, Inject 140 mg into the skin every 30 (thirty) days., Disp: 1.12 mL, Rfl: 5   escitalopram (LEXAPRO) 20 MG tablet, Take 1 tablet (20 mg total) by mouth daily., Disp: 90 tablet, Rfl: 1   metFORMIN (GLUCOPHAGE-XR) 750 MG 24 hr tablet, Take 2 tablets (1,500 mg total) by mouth daily with breakfast., Disp: 180 tablet, Rfl: 1   metoprolol succinate (TOPROL-XL) 50 MG 24 hr tablet, Take 1 tablet (50 mg total) by mouth every evening. Take with or immediately following a meal., Disp: 90 tablet, Rfl: 1   olmesartan-hydrochlorothiazide  (BENICAR HCT) 40-25 MG tablet, Take 1 tablet by mouth daily., Disp: 90 tablet, Rfl: 1   rosuvastatin (CRESTOR) 40 MG tablet, Take 1 tablet (40 mg total) by mouth daily., Disp: 90 tablet, Rfl: 3  Allergies  Allergen Reactions   Shingrix [Zoster Vac Recomb Adjuvanted]     Rash    Elavil [Amitriptyline Hcl] Rash   Tape Rash    Paper Tape    I personally reviewed active problem list, medication list, allergies, family history, social history, health maintenance with the patient/caregiver today.   ROS  Constitutional: Negative for fever or weight change.  Respiratory: Negative for cough and shortness of breath.   Cardiovascular: Negative for chest pain or palpitations.  Gastrointestinal: Negative for abdominal pain, no bowel changes.  Musculoskeletal: Negative for gait problem or joint swelling.  Skin: Negative for rash.  Neurological: Negative for dizziness , positive for intermittent headache.  No other specific complaints in a complete review of systems (except as listed in HPI above).   Objective  Vitals:   08/12/21 0745  BP: 128/72  Pulse: 79  Resp: 16  Temp: 98.1 F (36.7 C)  TempSrc: Oral  SpO2: 97%  Weight: 224 lb (101.6 kg)  Height: 5' 7" (1.702 m)    Body mass index is 35.08 kg/m.  Physical Exam  Constitutional: Patient appears well-developed and well-nourished. Obese  No distress.  HEENT: head atraumatic, normocephalic, pupils equal and reactive to light, neck supple Cardiovascular: Normal rate, regular rhythm and normal heart sounds.  No murmur heard. No BLE edema. Pulmonary/Chest: Effort normal and breath sounds normal. No respiratory distress. Abdominal: Soft.  There is no tenderness. Psychiatric: Patient has a normal mood and affect. behavior is normal. Judgment and thought content normal.   Recent Results (from the past 2160 hour(s))  POCT HgB A1C     Status: Abnormal   Collection Time: 08/12/21  7:57 AM  Result Value Ref Range   Hemoglobin A1C 7.1  (A) 4.0 - 5.6 %   HbA1c POC (<> result, manual entry)     HbA1c, POC (prediabetic range)     HbA1c, POC (controlled diabetic range)       PHQ2/9: Depression screen [facility] 2/9 08/12/2021  04/16/2021 03/31/2021 02/14/2021 11/13/2020  Decreased Interest 0 0 3 0 0  Down, Depressed, Hopeless 0 0 3 1 0  PHQ - 2 Score 0 0 6 1 0  Altered sleeping 0 0 0 0 0  Tired, decreased energy 0 0 0 0 0  Change in appetite 0 0 0 3 0  Feeling bad or failure about yourself  0 0 3 0 0  Trouble concentrating 0 0 0 1 0  Moving slowly or fidgety/restless 0 0 0 0 0  Suicidal thoughts 0 0 0 0 0  PHQ-9 Score 0 0 9 5 0  Difficult doing work/chores Not difficult at all - - Somewhat difficult -  Some recent data might be hidden    phq 9 is negative   Fall Risk: Fall Risk  08/12/2021 04/16/2021 03/31/2021 02/14/2021 11/13/2020  Falls in the past year? 0 0 0 0 0  Number falls in past yr: 0 0 0 0 -  Injury with Fall? 0 0 0 0 -  Risk for fall due to : - No Fall Risks No Fall Risks - -  Follow up Falls evaluation completed Falls prevention discussed Falls prevention discussed - Falls prevention discussed      Functional Status Survey: Is the patient deaf or have difficulty hearing?: Yes Does the patient have difficulty seeing, even when wearing glasses/contacts?: No Does the patient have difficulty concentrating, remembering, or making decisions?: No Does the patient have difficulty walking or climbing stairs?: Yes Does the patient have difficulty dressing or bathing?: No Does the patient have difficulty doing errands alone such as visiting a doctor's office or shopping?: No    Assessment & Plan  1. Diabetes mellitus type 2 in obese (HCC)  - POCT HgB A1C - [facility] Diabetes Foot Exam - metFORMIN (GLUCOPHAGE-XR) 750 MG 24 hr tablet; Take 2 tablets (1,500 mg total) by mouth daily with breakfast.  Dispense: 180 tablet; Refill: 1  2. Hypertension associated with diabetes ([place])  - amLODipine (NORVASC) 2.5 MG tablet; Take  1 tablet (2.5 mg total) by mouth daily.  Dispense: 90 tablet; Refill: 1 - dapagliflozin propanediol (FARXIGA) 10 MG TABS tablet; Take 1 tablet (10 mg total) by mouth daily before breakfast.  Dispense: 90 tablet; Refill: 1 - metoprolol succinate (TOPROL-XL) 50 MG 24 hr tablet; Take 1 tablet (50 mg total) by mouth every evening. Take with or immediately following a meal.  Dispense: 90 tablet; Refill: 1 - olmesartan-hydrochlorothiazide (BENICAR HCT) 40-25 MG tablet; Take 1 tablet by mouth daily.  Dispense: 90 tablet; Refill: 1  3. Hypertension, benign  - amLODipine (NORVASC) 2.5 MG tablet; Take 1 tablet (2.5 mg total) by mouth daily.  Dispense: 90 tablet; Refill: 1 - metoprolol succinate (TOPROL-XL) 50 MG 24 hr tablet; Take 1 tablet (50 mg total) by mouth every evening. Take with or immediately following a meal.  Dispense: 90 tablet; Refill: 1 - olmesartan-hydrochlorothiazide (BENICAR HCT) 40-25 MG tablet; Take 1 tablet by mouth daily.  Dispense: 90 tablet; Refill: 1  4. Recurrent major depression in remission (HCC)  - buPROPion (WELLBUTRIN XL) 300 MG 24 hr tablet; Take 1 tablet (300 mg total) by mouth daily.  Dispense: 90 tablet; Refill: 1 - escitalopram (LEXAPRO) 20 MG tablet; Take 1 tablet (20 mg total) by mouth daily.  Dispense: 90 tablet; Refill: 1  5. Dyslipidemia associated with type 2 diabetes mellitus (HCC)  - dapagliflozin propanediol (FARXIGA) 10 MG TABS tablet; Take 1 tablet (10 mg total) by mouth daily before breakfast.  Dispense:  90 tablet; Refill: 1 - metFORMIN (GLUCOPHAGE-XR) 750 MG 24 hr tablet; Take 2 tablets (1,500 mg total) by mouth daily with breakfast.  Dispense: 180 tablet; Refill: 1 - rosuvastatin (CRESTOR) 40 MG tablet; Take 1 tablet (40 mg total) by mouth daily.  Dispense: 90 tablet; Refill: 3  6. Morbid obesity ([place])  Discussed with the patient the risk posed by an increased BMI. Discussed importance of portion control, calorie counting and at least 150 minutes of  physical activity weekly. Avoid sweet beverages and drink more water. Eat at least 6 servings of fruit and vegetables daily    7. Migraine without aura and without status migrainosus, not intractable  - Erenumab-aooe (AIMOVIG) 140 MG/ML SOAJ; Inject 140 mg into the skin every 30 (thirty) days.  Dispense: 1.12 mL; Refill: 5  8. Unstable angina (HCC)  - metoprolol succinate (TOPROL-XL) 50 MG 24 hr tablet; Take 1 tablet (50 mg total) by mouth every evening. Take with or immediately following a meal.  Dispense: 90 tablet; Refill: 1  9. CAD in native artery  - metoprolol succinate (TOPROL-XL) 50 MG 24 hr tablet; Take 1 tablet (50 mg total) by mouth every evening. Take with or immediately following a meal.  Dispense: 90 tablet; Refill: 1

## 2021-08-12 ENCOUNTER — Ambulatory Visit (INDEPENDENT_AMBULATORY_CARE_PROVIDER_SITE_OTHER): Payer: Medicare Other | Admitting: Family Medicine

## 2021-08-12 ENCOUNTER — Encounter: Payer: Self-pay | Admitting: Family Medicine

## 2021-08-12 ENCOUNTER — Other Ambulatory Visit: Payer: Self-pay

## 2021-08-12 VITALS — BP 128/72 | HR 79 | Temp 98.1°F | Resp 16 | Ht 67.0 in | Wt 224.0 lb

## 2021-08-12 DIAGNOSIS — G43009 Migraine without aura, not intractable, without status migrainosus: Secondary | ICD-10-CM

## 2021-08-12 DIAGNOSIS — I10 Essential (primary) hypertension: Secondary | ICD-10-CM

## 2021-08-12 DIAGNOSIS — E1169 Type 2 diabetes mellitus with other specified complication: Secondary | ICD-10-CM | POA: Diagnosis not present

## 2021-08-12 DIAGNOSIS — F3340 Major depressive disorder, recurrent, in remission, unspecified: Secondary | ICD-10-CM | POA: Diagnosis not present

## 2021-08-12 DIAGNOSIS — E669 Obesity, unspecified: Secondary | ICD-10-CM

## 2021-08-12 DIAGNOSIS — I2510 Atherosclerotic heart disease of native coronary artery without angina pectoris: Secondary | ICD-10-CM

## 2021-08-12 DIAGNOSIS — E1159 Type 2 diabetes mellitus with other circulatory complications: Secondary | ICD-10-CM

## 2021-08-12 DIAGNOSIS — I152 Hypertension secondary to endocrine disorders: Secondary | ICD-10-CM

## 2021-08-12 DIAGNOSIS — I200 Unstable angina: Secondary | ICD-10-CM

## 2021-08-12 DIAGNOSIS — E785 Hyperlipidemia, unspecified: Secondary | ICD-10-CM

## 2021-08-12 LAB — POCT GLYCOSYLATED HEMOGLOBIN (HGB A1C): Hemoglobin A1C: 7.1 % — AB (ref 4.0–5.6)

## 2021-08-12 MED ORDER — METOPROLOL SUCCINATE ER 50 MG PO TB24: 50.0000 mg | ORAL_TABLET | Freq: Every evening | ORAL | 1 refills | Status: DC

## 2021-08-12 MED ORDER — ESCITALOPRAM OXALATE 20 MG PO TABS: 20.0000 mg | ORAL_TABLET | Freq: Every day | ORAL | 1 refills | Status: DC

## 2021-08-12 MED ORDER — BUPROPION HCL ER (XL) 300 MG PO TB24: 300.0000 mg | ORAL_TABLET | Freq: Every day | ORAL | 1 refills | Status: DC

## 2021-08-12 MED ORDER — DAPAGLIFLOZIN PROPANEDIOL 10 MG PO TABS: 10.0000 mg | ORAL_TABLET | Freq: Every day | ORAL | 1 refills | Status: DC

## 2021-08-12 MED ORDER — OLMESARTAN MEDOXOMIL-HCTZ 40-25 MG PO TABS: 1.0000 | ORAL_TABLET | Freq: Every day | ORAL | 1 refills | Status: DC

## 2021-08-12 MED ORDER — AMLODIPINE BESYLATE 2.5 MG PO TABS: 2.5000 mg | ORAL_TABLET | Freq: Every day | ORAL | 1 refills | Status: DC

## 2021-08-12 MED ORDER — METFORMIN HCL ER 750 MG PO TB24: 1500.0000 mg | ORAL_TABLET | Freq: Every day | ORAL | 1 refills | Status: DC

## 2021-08-12 MED ORDER — ROSUVASTATIN CALCIUM 40 MG PO TABS: 40.0000 mg | ORAL_TABLET | Freq: Every day | ORAL | 3 refills | Status: DC

## 2021-08-12 MED ORDER — AIMOVIG 140 MG/ML ~~LOC~~ SOAJ: 1.0000 | SUBCUTANEOUS | 5 refills | Status: DC

## 2021-08-15 ENCOUNTER — Other Ambulatory Visit: Payer: Self-pay | Admitting: Cardiology

## 2021-08-15 DIAGNOSIS — I208 Other forms of angina pectoris: Secondary | ICD-10-CM

## 2021-08-15 DIAGNOSIS — I2510 Atherosclerotic heart disease of native coronary artery without angina pectoris: Secondary | ICD-10-CM

## 2021-08-26 ENCOUNTER — Telehealth (HOSPITAL_COMMUNITY): Payer: Self-pay | Admitting: Emergency Medicine

## 2021-08-26 NOTE — Telephone Encounter (Signed)
Attempted to call patient regarding upcoming cardiac CT appointment. °Left message on voicemail with name and callback number °Emilene Roma RN Navigator Cardiac Imaging °[facility] °[phone] Office °[phone] Cell ° °

## 2021-08-28 ENCOUNTER — Ambulatory Visit
Admission: RE | Admit: 2021-08-28 | Discharge: 2021-08-28 | Disposition: A | Discharge status: Home / Self Care | Payer: Medicare Other | Source: Ambulatory Visit | Attending: Cardiology | Admitting: Cardiology

## 2021-08-28 ENCOUNTER — Other Ambulatory Visit: Payer: Self-pay

## 2021-08-28 DIAGNOSIS — I2510 Atherosclerotic heart disease of native coronary artery without angina pectoris: Secondary | ICD-10-CM | POA: Insufficient documentation

## 2021-08-28 DIAGNOSIS — I208 Other forms of angina pectoris: Secondary | ICD-10-CM | POA: Diagnosis present

## 2021-08-28 LAB — POCT I-STAT CREATININE: Creatinine, Ser: 0.9 mg/dL (ref 0.61–1.24)

## 2021-08-28 MED ORDER — IOHEXOL 350 MG/ML SOLN
100.0000 mL | Freq: Once | INTRAVENOUS | Status: AC | PRN
  Administered 2021-08-28: 100 mL via INTRAVENOUS

## 2021-08-28 MED ORDER — NITROGLYCERIN 0.4 MG SL SUBL
0.8000 mg | SUBLINGUAL_TABLET | Freq: Once | SUBLINGUAL | Status: AC
  Administered 2021-08-28: 0.8 mg via SUBLINGUAL

## 2021-08-28 MED ORDER — METOPROLOL TARTRATE 5 MG/5ML IV SOLN
10.0000 mg | Freq: Once | INTRAVENOUS | Status: AC
  Administered 2021-08-28: 10 mg via INTRAVENOUS

## 2021-08-28 MED ORDER — METOPROLOL TARTRATE 5 MG/5ML IV SOLN
10.0000 mg | Freq: Once | INTRAVENOUS | Status: AC
Start: 2021-08-28 — End: 2021-08-28
  Administered 2021-08-28: 10 mg via INTRAVENOUS

## 2021-08-28 NOTE — Progress Notes (Signed)
Patient tolerated CT well. Gave a bottle of water to patient to drink. Vital signs stable encourage to drink water throughout day.Reasons explained and verbalized understanding. Ambulated steady gait.

## 2021-09-09 ENCOUNTER — Encounter: Payer: Self-pay | Admitting: Cardiology

## 2021-09-09 ENCOUNTER — Encounter
Admission: RE | Disposition: A | Discharge status: Home / Self Care | Payer: Self-pay | Source: Ambulatory Visit | Attending: Cardiology

## 2021-09-09 ENCOUNTER — Ambulatory Visit
Admission: RE | Admit: 2021-09-09 | Discharge: 2021-09-09 | Disposition: A | Discharge status: Home / Self Care | Payer: Medicare Other | Source: Ambulatory Visit | Attending: Cardiology | Admitting: Cardiology

## 2021-09-09 DIAGNOSIS — Z87891 Personal history of nicotine dependence: Secondary | ICD-10-CM | POA: Insufficient documentation

## 2021-09-09 DIAGNOSIS — I10 Essential (primary) hypertension: Secondary | ICD-10-CM | POA: Diagnosis not present

## 2021-09-09 DIAGNOSIS — Z79899 Other long term (current) drug therapy: Secondary | ICD-10-CM | POA: Insufficient documentation

## 2021-09-09 DIAGNOSIS — E119 Type 2 diabetes mellitus without complications: Secondary | ICD-10-CM | POA: Diagnosis not present

## 2021-09-09 DIAGNOSIS — R0602 Shortness of breath: Secondary | ICD-10-CM | POA: Diagnosis not present

## 2021-09-09 DIAGNOSIS — R9439 Abnormal result of other cardiovascular function study: Secondary | ICD-10-CM

## 2021-09-09 DIAGNOSIS — Z7984 Long term (current) use of oral hypoglycemic drugs: Secondary | ICD-10-CM | POA: Diagnosis not present

## 2021-09-09 DIAGNOSIS — Z955 Presence of coronary angioplasty implant and graft: Secondary | ICD-10-CM | POA: Insufficient documentation

## 2021-09-09 DIAGNOSIS — I2510 Atherosclerotic heart disease of native coronary artery without angina pectoris: Secondary | ICD-10-CM | POA: Diagnosis not present

## 2021-09-09 HISTORY — PX: INTRAVASCULAR PRESSURE WIRE/FFR STUDY: CATH118243

## 2021-09-09 HISTORY — PX: LEFT HEART CATH AND CORONARY ANGIOGRAPHY: CATH118249

## 2021-09-09 LAB — GLUCOSE, CAPILLARY: Glucose-Capillary: 171 mg/dL — ABNORMAL HIGH (ref 70–99)

## 2021-09-09 LAB — POCT ACTIVATED CLOTTING TIME: Activated Clotting Time: 323 seconds

## 2021-09-09 SURGERY — LEFT HEART CATH AND CORONARY ANGIOGRAPHY
Anesthesia: Moderate Sedation

## 2021-09-09 MED ORDER — MIDAZOLAM HCL 2 MG/2ML IJ SOLN
INTRAMUSCULAR | Status: AC
  Filled 2021-09-09: qty 2

## 2021-09-09 MED ORDER — HEPARIN (PORCINE) IN NACL 1000-0.9 UT/500ML-% IV SOLN
INTRAVENOUS | Status: AC
  Filled 2021-09-09: qty 1000

## 2021-09-09 MED ORDER — HEPARIN (PORCINE) IN NACL 2000-0.9 UNIT/L-% IV SOLN
INTRAVENOUS | Status: DC | PRN
  Administered 2021-09-09: 1000 mL

## 2021-09-09 MED ORDER — SODIUM CHLORIDE 0.9 % IV SOLN
INTRAVENOUS | Status: DC
  Administered 2021-09-09: 1000 mL via INTRAVENOUS

## 2021-09-09 MED ORDER — SODIUM CHLORIDE 0.9% FLUSH: 3.0000 mL | INTRAVENOUS | Status: DC | PRN

## 2021-09-09 MED ORDER — SODIUM CHLORIDE 0.9 % IV SOLN: 250.0000 mL | INTRAVENOUS | Status: DC | PRN

## 2021-09-09 MED ORDER — SODIUM CHLORIDE 0.9% FLUSH: 3.0000 mL | Freq: Two times a day (BID) | INTRAVENOUS | Status: DC

## 2021-09-09 MED ORDER — HEPARIN SODIUM (PORCINE) 1000 UNIT/ML IJ SOLN
INTRAMUSCULAR | Status: DC | PRN
  Administered 2021-09-09 (×2): 5000 [IU] via INTRAVENOUS

## 2021-09-09 MED ORDER — FENTANYL CITRATE (PF) 100 MCG/2ML IJ SOLN
INTRAMUSCULAR | Status: AC
  Filled 2021-09-09: qty 2

## 2021-09-09 MED ORDER — HEPARIN SODIUM (PORCINE) 1000 UNIT/ML IJ SOLN
INTRAMUSCULAR | Status: AC
  Filled 2021-09-09: qty 10

## 2021-09-09 MED ORDER — ACETAMINOPHEN 325 MG PO TABS: 650.0000 mg | ORAL_TABLET | ORAL | Status: DC | PRN

## 2021-09-09 MED ORDER — IOHEXOL 300 MG/ML  SOLN
INTRAMUSCULAR | Status: DC | PRN
  Administered 2021-09-09: 78 mL

## 2021-09-09 MED ORDER — VERAPAMIL HCL 2.5 MG/ML IV SOLN
INTRAVENOUS | Status: AC
  Filled 2021-09-09: qty 2

## 2021-09-09 MED ORDER — ONDANSETRON HCL 4 MG/2ML IJ SOLN: 4.0000 mg | Freq: Four times a day (QID) | INTRAMUSCULAR | Status: DC | PRN

## 2021-09-09 MED ORDER — ASPIRIN 81 MG PO CHEW
CHEWABLE_TABLET | ORAL | Status: AC
  Filled 2021-09-09: qty 4

## 2021-09-09 MED ORDER — ASPIRIN 81 MG PO CHEW
324.0000 mg | CHEWABLE_TABLET | ORAL | Status: AC
  Administered 2021-09-09: 324 mg via ORAL

## 2021-09-09 MED ORDER — MIDAZOLAM HCL 2 MG/2ML IJ SOLN
INTRAMUSCULAR | Status: DC | PRN
  Administered 2021-09-09: 1 mg via INTRAVENOUS

## 2021-09-09 MED ORDER — FENTANYL CITRATE (PF) 100 MCG/2ML IJ SOLN
INTRAMUSCULAR | Status: DC | PRN
  Administered 2021-09-09: 25 ug via INTRAVENOUS

## 2021-09-09 MED ORDER — VERAPAMIL HCL 2.5 MG/ML IV SOLN
INTRAVENOUS | Status: DC | PRN
  Administered 2021-09-09: 2.5 mg via INTRA_ARTERIAL

## 2021-09-09 MED ORDER — SODIUM CHLORIDE 0.9 % IV SOLN: INTRAVENOUS | Status: DC

## 2021-09-09 SURGICAL SUPPLY — 15 items
CATH 5FR JL3.5 JR4 ANG PIG MP (CATHETERS) ×1 IMPLANT
CATH LAUNCHER 6FR EBU3.5 (CATHETERS) ×1 IMPLANT
DEVICE RAD TR BAND REGULAR (VASCULAR PRODUCTS) ×1 IMPLANT
DRAPE BRACHIAL (DRAPES) ×1 IMPLANT
GLIDESHEATH SLEND SS 6F .021 (SHEATH) ×1 IMPLANT
GUIDEWIRE INQWIRE 1.5J.035X260 (WIRE) IMPLANT
GUIDEWIRE PRESS OMNI 185 ST (WIRE) ×1 IMPLANT
INQWIRE 1.5J .035X260CM (WIRE) ×2
KIT SYRINGE INJ CVI SPIKEX1 (MISCELLANEOUS) ×1 IMPLANT
PACK CARDIAC CATH (CUSTOM PROCEDURE TRAY) ×2 IMPLANT
PROTECTION STATION PRESSURIZED (MISCELLANEOUS) ×2
SET ATX SIMPLICITY (MISCELLANEOUS) ×1 IMPLANT
STATION PROTECTION PRESSURIZED (MISCELLANEOUS) IMPLANT
TUBING CIL FLEX 10 FLL-RA (TUBING) ×1 IMPLANT
VALVE COPILOT STAT (MISCELLANEOUS) ×1 IMPLANT

## 2021-09-09 NOTE — H&P (Signed)
[facility] Cardiology History and Physical  Patient ID: Jimmy Green MRN: [id number] DOB/AGE: 1951/01/31 71 y.o. Admit date: 09/09/2021  Primary Care Physician: Steele Sizer, MD Primary Cardiologist Andrez Grime, MD   HPI:   [name] is a 71 year old male with history of CAD s/p DES to the mid LAD, type 2 diabetes, hypertension who recently has been complaining of shortness of breath and fatigue.  He had a cardiac CTA which showed a 90% lesion of his proximal LAD just prior to his previously placed stent.   Past Medical History:  Diagnosis Date   Anginal pain ([place])    Basal cell carcinoma 02/21/2018   left posterior shoulder at lat edge of scar   Basal cell carcinoma 03/11/2021   Left upper back medial  SUPERFICIAL AND NODULAR PATTERNS, BASE INVOLVED, appears clear with biopsy   Basal cell carcinoma    left posterior shoulder, excised by Dr Fleet Contras in the past.   BCC (basal cell carcinoma of skin) 03/11/2021   left upper back lateral - SUPERFICIAL BASAL CELL CARCINOMA. Clear with biopsy.   BCC (basal cell carcinoma of skin) 03/11/2021   right mid back - BASAL CELL CARCINOMA, clear with biopsy.   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    Past Surgical History:  Procedure Laterality Date   BACK SURGERY     BASAL CELL CARCINOMA EXCISION Left 12/02/2015   Chest-Done by Dermatologist    BASAL CELL CARCINOMA EXCISION Left 02/21/2018   Nodulocytic pattern, deep margin involved - [facility] - Dr. Brendolyn Patty   BASAL CELL CARCINOMA EXCISION  02/23/2018   CARDIAC  CATHETERIZATION Left 04/01/2016   Procedure: Left Heart Cath and Coronary Angiography;  Surgeon: Teodoro Spray, MD;  Location: [place];  Service: Cardiovascular;  Laterality: Left;   CARDIAC CATHETERIZATION N/A 04/01/2016   Procedure: Intravascular Pressure Wire/FFR Study;  Surgeon: Isaias Cowman, MD;  Location: [place] [facility];  Service: Cardiovascular;  Laterality: N/A;   COLONOSCOPY N/A 01/15/2015   Wohl-ileitis, 2 benign polyps, cryptitis, sigmoid diverticulosis, focal ulceration ICV   COLONOSCOPY WITH PROPOFOL N/A 08/29/2020   Procedure: COLONOSCOPY WITH PROPOFOL;  Surgeon: Lucilla Lame, MD;  Location: [facility] ENDOSCOPY;  Service: Endoscopy;  Laterality: N/A;   CORONARY STENT PLACEMENT     FRACTIONAL FLOW RESERVE WIRE  10/08/2011   Procedure: FRACTIONAL FLOW RESERVE WIRE;  Surgeon: Clent Demark, MD;  Location: [facility] CATH LAB;  Service: Cardiovascular;;   HERNIA REPAIR     KNEE SURGERY     LEFT HEART CATHETERIZATION WITH CORONARY ANGIOGRAM N/A 10/08/2011   Procedure: LEFT HEART CATHETERIZATION WITH CORONARY ANGIOGRAM;  Surgeon: Clent Demark, MD;  Location: [facility];  Service: Cardiovascular;  Laterality: N/A;   parodectomy Left 05/07/2020   SKIN CANCER EXCISION  01/2018   [facility]   TOTAL HIP ARTHROPLASTY Left 04/06/2017   Procedure: TOTAL HIP ARTHROPLASTY ANTERIOR APPROACH;  Surgeon: Hessie Knows, MD;  Location: [facility] ORS;  Service: Orthopedics;  Laterality: Left;  Medications Prior to Admission  Medication Sig Dispense Refill Last Dose   amLODipine (NORVASC) 2.5 MG tablet Take 1 tablet (2.5 mg total) by mouth daily. 90 tablet 1 09/08/2021   buPROPion (WELLBUTRIN XL) 150 MG 24 hr tablet Take 150 mg by mouth See admin instructions. Take with 300 mg for a total of 450 mg daily   09/08/2021   buPROPion (WELLBUTRIN XL) 300 MG 24 hr tablet Take 1 tablet (300 mg total) by mouth daily. (Patient taking differently: Take 300 mg by mouth See admin instructions. Take  with 150 mg for a total of 450 mg daily) 90 tablet 1 09/08/2021   dapagliflozin propanediol (FARXIGA) 10 MG TABS tablet Take 1 tablet (10 mg total) by mouth daily before breakfast. 90 tablet 1 09/08/2021   Erenumab-aooe (AIMOVIG) 140 MG/ML SOAJ Inject 140 mg into the skin every 30 (thirty) days. 1.12 mL 5    escitalopram (LEXAPRO) 20 MG tablet Take 1 tablet (20 mg total) by mouth daily. 90 tablet 1 09/08/2021   gabapentin (NEURONTIN) 300 MG capsule Take 1 capsule (300 mg total) by mouth at bedtime. (Patient taking differently: Take 1,500-2,100 mg by mouth daily as needed (Restless leg syndrom).) 90 capsule 1 09/08/2021   lidocaine (LIDODERM) 5 % 1-2 patches every 12 (twelve) hours. If tolerated      metFORMIN (GLUCOPHAGE-XR) 750 MG 24 hr tablet Take 2 tablets (1,500 mg total) by mouth daily with breakfast. 180 tablet 1 09/07/2021   metoprolol succinate (TOPROL-XL) 50 MG 24 hr tablet Take 1 tablet (50 mg total) by mouth every evening. Take with or immediately following a meal. 90 tablet 1 09/08/2021   NARCAN 4 MG/0.1ML LIQD nasal spray kit 0.4 mg once.  0    nitroGLYCERIN (NITROSTAT) 0.4 MG SL tablet DISSOLVE 1 TABLET UNDER THE TONGUE EVERY 5 MINUTES FOR UP TO 3 DOSES AS NEEDED FOR CHEST PAIN. IF NO RELIEF AFTER 3 DOSES, CALL 911 OR GO TO ER. 25 tablet 0    olmesartan-hydrochlorothiazide (BENICAR HCT) 40-25 MG tablet Take 1 tablet by mouth daily. 90 tablet 1 09/08/2021   omeprazole (PRILOSEC) 40 MG capsule Take 1 capsule (40 mg total) by mouth daily. 90 capsule 1 09/08/2021   oxyCODONE (ROXICODONE) 15 MG immediate release tablet Take 7.5-15 mg by mouth 5 (five) times daily as needed for pain.      rosuvastatin (CRESTOR) 40 MG tablet Take 1 tablet (40 mg total) by mouth daily. 90 tablet 3 Past Month   Accu-Chek FastClix Lancets MISC USE AS INSTRUCTED ONCE DAILY (Patient not taking: Reported on 08/12/2021) 102 each 12    ACCU-CHEK GUIDE test strip USE AS INSTRUCTED ONCE DAILY (Patient not taking: Reported on 08/12/2021)  100 strip 12    aspirin EC 81 MG tablet Take 81 mg by mouth every morning.    09/07/2021   ketoconazole (NIZORAL) 2 % cream Apply to rash behind ears 1-2 times a day as directed. (Patient not taking: Reported on 09/03/2021) 30 g 2 Not Taking   metroNIDAZOLE (METROGEL) 0.75 % gel Apply to face 1-2 times a day for roscea. (Patient not taking: Reported on 09/03/2021) 45 g 5 Not Taking   Social History   Socioeconomic History   Marital status: Married    Spouse name: Thayer Headings   Number of children: 1   Years of education: Not on file   Highest education level: High school graduate  Occupational History   Occupation: retired  Tobacco Use   Smoking status: Former    Years:  40.00    Types: Cigarettes    Quit date: 08/03/2012    Years since quitting: 9.1   Smokeless tobacco: Never  Vaping Use   Vaping Use: Never used  Substance and Sexual Activity   Alcohol use: No    Alcohol/week: 0.0 standard drinks   Drug use: No   Sexual activity: Yes    Partners: Female    Birth control/protection: None  Other Topics Concern   Not on file  Social History Narrative   Wife has 2 children by another marriage and he has 1 child by a previous marriage.   Social Determinants of Health   Financial Resource Strain: Low Risk    Difficulty of Paying Living Expenses: Not hard at all  Food Insecurity: No Food Insecurity   Worried About Charity fundraiser in the Last Year: Never true   [place] in the Last Year: Never true  Transportation Needs: No Transportation Needs   Lack of Transportation (Medical): No   Lack of Transportation (Non-Medical): No  Physical Activity: Sufficiently Active   Days of Exercise per Week: 7 days   Minutes of Exercise per Session: 40 min  Stress: No Stress Concern Present   Feeling of Stress : Only a little  Social Connections: Socially Isolated   Frequency of Communication with Friends and Family: Once a week   Frequency of Social Gatherings with Friends and Family: Once  a week   Attends Religious Services: Never   Printmaker: No   Attends Music therapist: Never   Marital Status: Married  Human resources officer Violence: Not At Risk   Fear of Current or Ex-Partner: No   Emotionally Abused: No   Physically Abused: No   Sexually Abused: No    Family History  Problem Relation Age of Onset   Lung cancer Father    Heart disease Father    Skin cancer Father       Review of systems complete and found to be negative unless listed above      Physical Exam:  General: Well developed, well nourished, in no acute distress HEENT:  Normocephalic and atramatic Neck:  No JVD.  Lungs: Clear bilaterally to auscultation and percussion. Heart: HRRR . Normal S1 and S2 without gallops or murmurs.  Abdomen: Bowel sounds are positive, abdomen soft and non-tender  Msk:  Back normal, normal gait. Normal strength and tone for age. Extremities: No clubbing, cyanosis or edema.   Neuro: Alert and oriented X 3. Psych:  Good affect, responds appropriately   Labs:   Lab Results  Component Value Date   WBC 6.8 11/13/2020   HGB 15.7 11/13/2020   HCT 47.1 11/13/2020   MCV 90.6 11/13/2020   PLT 264 11/13/2020   No results for input(s): NA, K, CL, CO2, BUN, CREATININE, CALCIUM, PROT, BILITOT, ALKPHOS, ALT, AST, GLUCOSE in the last 168 hours.  Invalid input(s): LABALBU Lab Results  Component Value Date   CKTOTAL 48 10/02/2011   CKMB 2.2 10/02/2011   TROPONINI <0.30 10/02/2011    Lab Results  Component Value Date   CHOL 111 03/31/2021   CHOL 112 03/14/2020   CHOL 136 11/13/2019   Lab Results  Component Value Date   HDL 24 (L) 03/31/2021   HDL 28 (L) 03/14/2020   HDL 30 (L) 11/13/2019   Lab Results  Component Value Date   LDLCALC 58 03/31/2021   LDLCALC 60 03/14/2020   [place] 82 11/13/2019  Lab Results  Component Value Date   TRIG 218 (H) 03/31/2021   TRIG 154 (H) 03/14/2020   TRIG 138 11/13/2019   Lab  Results  Component Value Date   CHOLHDL 4.6 03/31/2021   CHOLHDL 4.0 03/14/2020   CHOLHDL 4.5 11/13/2019   No results found for: LDLDIRECT    Radiology: CT CORONARY MORPH W/CTA COR W/SCORE W/CA W/CM &/OR WO/CM  Addendum Date: 08/28/2021   ADDENDUM REPORT: 08/28/2021 16:45 EXAM: OVER-READ INTERPRETATION  CT CHEST The following report is an over-read performed by radiologist Dr. Abigail Miyamoto of [facility], [place] on 08/28/2021. This over-read does not include interpretation of cardiac or coronary anatomy or pathology. The coronary CTA interpretation by the cardiologist is attached. COMPARISON:  09/16/2018 chest radiograph. FINDINGS: Vascular: Aortic atherosclerosis. No central pulmonary embolism, on this non-dedicated study. Mediastinum/Nodes: No imaged thoracic adenopathy. Lungs/Pleura: No pleural fluid. Bibasilar subsegmental atelectasis dependently. Upper Abdomen: Normal imaged portions of the liver, spleen, stomach. Musculoskeletal: No acute osseous abnormality. IMPRESSION: 1.  No acute findings in the imaged extracardiac chest. 2.  Aortic Atherosclerosis (ICD10-I70.0). Electronically Signed   By: Abigail Miyamoto M.D.   On: 08/28/2021 16:45   Result Date: 08/28/2021 CLINICAL DATA:  Chest pain, hx of CAD/PCI EXAM: Cardiac/Coronary  CTA TECHNIQUE: The patient was scanned on a Siemens Somatom go.Top scanner. : A retrospective scan was triggered in the descending thoracic aorta. Axial non-contrast 3 mm slices were carried out through the heart. The data set was analyzed on a dedicated work station and scored using the [place]. Gantry rotation speed was 330 msecs and collimation was .6 mm. 39m of metoprolol and 0.8 mg of sl NTG was given. The 3D data set was reconstructed in 5% intervals of the 60-95 % of the R-R cycle. Diastolic phases were analyzed on a dedicated work station using MPR, MIP and VRT modes. The patient received 100 cc of contrast. FINDINGS: Aorta: Normal size. Mild aortic root and  descending aorta calcifications. No dissection. Aortic Valve:  Trileaflet.  No calcifications. Coronary Arteries:  Normal coronary origin.  Right dominance. RCA is a dominant artery that gives rise to PDA and PLA. There is calcified plaque in the proximal segment causing mild stenosis (25-49%). Left main is a large artery that gives rise to LAD and LCX arteries. LAD has a stent in the proximal- mid segment. There is severe stenosis (>70%) in the proximal LAD just proximal to the LAD stent. Cannot evaluate or rule out in-stent restenosis due to metal artifacts. LCX is a small non-dominant artery. There is no plaque. Other findings: Normal pulmonary vein drainage into the left atrium. Normal left atrial appendage without a thrombus. Normal size of the pulmonary artery. IMPRESSION: 1. Calcium score not calculated due to presence of stent in the proximal-mid LAD. 2. Normal coronary origin with right dominance. 3. Severe stenosis in the proximal LAD just proximal to the LAD stent. 4. Cannot evaluate or rule out in-stent restenosis due to metal artifacts. 5. FFRct not submitted as it is not applicable in the presence of a stent. 6. CAD-RADS 4 Severe stenosis. (70-99% or > 50% left main). Cardiac catheterization is recommended. Electronically Signed: By: BKate SableM.D. On: 08/28/2021 16:33    EKG: NSR. Non-specific T wave abnormalities.   ASSESSMENT AND PLAN:  71year old male with history of CAD s/p PCI to his mid LAD, type 2 diabetes, hypertension who has been experiencing shortness of breath and chest discomfort.  He had a cardiac CTA which showed a high-grade  lesion in his proximal LAD just prior to his previously placed stent.  He was referred for heart catheterization for further evaluation.  The risks and benefits were discussed with the patient and his wife who are agreeable to proceed.  Signed: Andrez Grime MD 09/09/2021, 7:38 AM

## 2021-09-12 ENCOUNTER — Telehealth: Payer: Self-pay | Admitting: Family Medicine

## 2021-09-12 NOTE — Telephone Encounter (Signed)
Patient needs to cancel AwV. They will call back to reschedule.

## 2021-09-16 ENCOUNTER — Ambulatory Visit: Payer: Medicare Other

## 2021-10-08 ENCOUNTER — Telehealth: Payer: Self-pay | Admitting: Family Medicine

## 2021-10-08 NOTE — Telephone Encounter (Signed)
Copied from [place] [phone]. Topic: Medicare AWV ?>> Oct 08, 2021  1:37 PM Cher Nakai R wrote: ?Reason for CRM:  ?Left message for patient to call back and schedule Medicare Annual Wellness Visit (AWV) in office.  ? ?If unable to come into the office for AWV,  please offer to do virtually or by telephone. ? ?Last AWV: 09/12/2020 ? ?Please schedule at anytime with [place]. ? ?40 minute appointment ? ?Any questions, please contact me at [phone] ?

## 2021-10-27 ENCOUNTER — Other Ambulatory Visit: Payer: Self-pay | Admitting: Family Medicine

## 2021-10-28 ENCOUNTER — Ambulatory Visit (INDEPENDENT_AMBULATORY_CARE_PROVIDER_SITE_OTHER): Payer: Medicare Other | Admitting: Dermatology

## 2021-10-28 ENCOUNTER — Other Ambulatory Visit: Payer: Self-pay

## 2021-10-28 DIAGNOSIS — Z1283 Encounter for screening for malignant neoplasm of skin: Secondary | ICD-10-CM | POA: Diagnosis not present

## 2021-10-28 DIAGNOSIS — L578 Other skin changes due to chronic exposure to nonionizing radiation: Secondary | ICD-10-CM

## 2021-10-28 DIAGNOSIS — L820 Inflamed seborrheic keratosis: Secondary | ICD-10-CM | POA: Diagnosis not present

## 2021-10-28 DIAGNOSIS — L219 Seborrheic dermatitis, unspecified: Secondary | ICD-10-CM

## 2021-10-28 DIAGNOSIS — D099 Carcinoma in situ, unspecified: Secondary | ICD-10-CM

## 2021-10-28 DIAGNOSIS — Z85828 Personal history of other malignant neoplasm of skin: Secondary | ICD-10-CM

## 2021-10-28 DIAGNOSIS — L719 Rosacea, unspecified: Secondary | ICD-10-CM | POA: Diagnosis not present

## 2021-10-28 DIAGNOSIS — L821 Other seborrheic keratosis: Secondary | ICD-10-CM

## 2021-10-28 DIAGNOSIS — D040 Carcinoma in situ of skin of lip: Secondary | ICD-10-CM

## 2021-10-28 DIAGNOSIS — D229 Melanocytic nevi, unspecified: Secondary | ICD-10-CM

## 2021-10-28 DIAGNOSIS — D1800 Hemangioma unspecified site: Secondary | ICD-10-CM

## 2021-10-28 DIAGNOSIS — L814 Other melanin hyperpigmentation: Secondary | ICD-10-CM

## 2021-10-28 MED ORDER — FLUOROURACIL 5 % EX CREA: TOPICAL_CREAM | Freq: Two times a day (BID) | CUTANEOUS | 1 refills | Status: DC

## 2021-10-28 NOTE — Progress Notes (Signed)
? ?Follow-Up Visit ?  ?Subjective  ?Jimmy Green is a 71 y.o. male who presents for the following: Follow-up (Patient here today for upper body exam. Patient has history of multiple bcc's , aks and isks. Patient has current bx proven sccis at left lower lip that he deferred treatment. He reports it occasionally bothers him and has noticed a bump at area. ). Patient had deferred ED&C treatment at last visit and preferred to observe.  ? ?The patient presents for Upper Body Skin Exam (UBSE) for skin cancer screening and mole check.  The patient has spots, moles and lesions to be evaluated, some may be new or changing and the patient has concerns that these could be cancer. ? ? ?The following portions of the chart were reviewed this encounter and updated as appropriate:   ?  ? ?Review of Systems: No other skin or systemic complaints except as noted in HPI or Assessment and Plan. ? ? ?Objective  ?Well appearing patient in no apparent distress; mood and affect are within normal limits. ? ?All skin waist up examined. ? ?left lower lip ?2.5 mm smooth firm flesh white papule c/w scar ? ?bilateral postauricular, face ?Clear  ? ?face ?Mid face erythema with telangiectasias with few pink papules ? ? ?left forearm x 1, right hand dorsum x 1 (2) ?Erythematous stuck-on, waxy papule ? ? 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 ?fluorouracil (EFUDEX) 5 % cream ?Apply topically 2 (two) times daily. Apply to area for 14 days to left lower lip ? ?Biopsy proven SQUAMOUS CELL CARCINOMA IN SITU, BASE INVOLVED, ULCERATED, biopsy only (Patient had deferred ED&C treatment at last visit and preferred to observe.) ?With possible residual scar tissue  ?Patient reports sometimes painful ? ?Recommend starting 5 fluorouracil/calcipotrience cream or could do biopsy to r/o out recurrence of scc.  ? ?Patent prefers to start topical treatment.  ? ?Start 5-fluorouracil/calcipotriene cream twice a day for up to 14 days  to affected areas including left lower lip. Prescription sent to [facility]. Patient provided with contact information for pharmacy and advised the pharmacy will mail the prescription to their home. Patient provided with handout reviewing treatment course and side effects and advised to call or message [place] on MyChart with any concerns. ? ?5-fluorouracil/calcipotriene cream is is a type of field treatment used to treat precancers, thin skin cancers, and areas of sun damage. Expected reaction includes irritation and mild inflammation potentially progressing to more severe inflammation including redness, scaling, crusting and open sores/erosions.  If too much irritation occurs, ensure application of only a thin layer and decrease frequency of use to achieve a tolerable level of inflammation. Recommend applying Vaseline ointment to open sores as needed.  Minimize sun exposure while under treatment. Recommend daily broad spectrum sunscreen SPF 30+ to sun-exposed areas, reapply every 2 hours as needed.  ? 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 ?Chronic condition with duration or expected duration over one year. Currently well-controlled. ? ?Seborrheic Dermatitis  ?-  is a chronic persistent rash characterized by pinkness and scaling most commonly of the mid face but also can occur on the scalp (dandruff), ears; mid chest, mid back and groin.  It tends to be exacerbated by stress and cooler weather.  People who have neurologic disease may experience new onset or exacerbation of existing seborrheic dermatitis.  The condition is not curable but treatable and can be controlled. ?  ?Continue hydrocortisone 2.5% cream Apply qd/bid as directed   ?  Continue  ketoconazole 2% cream Apply qd/bid as directed  ? ? ?Related Medications ?ketoconazole (NIZORAL) 2 % cream ?Apply to rash behind ears 1-2 times a day as directed. ? ?Rosacea ?face ? ?Improving ? ?Rosacea is a chronic progressive skin condition usually  affecting the face of adults, causing redness and/or acne bumps. It is treatable but not curable. It sometimes affects the eyes (ocular rosacea) as well. It may respond to topical and/or systemic medication and can flare with stress, sun exposure, alcohol, exercise and some foods.  Daily application of broad spectrum spf 30+ sunscreen to face is recommended to reduce flares. ?  ?Continue metronidazole 0.75% gel qd/bid ?  ? ?Related Medications ?metroNIDAZOLE (METROGEL) 0.75 % gel ?Apply to face 1-2 times a day for roscea. ? ?Inflamed seborrheic keratosis (2) ?left forearm x 1, right hand dorsum x 1 ? ?  ? ? ? ?Destruction of lesion - left forearm x 1, right hand dorsum x 1 ? ?Destruction method: cryotherapy   ?Informed consent: discussed and consent obtained   ?Lesion destroyed using liquid nitrogen: Yes   ?Region frozen until ice ball extended beyond lesion: Yes   ?Outcome: patient tolerated procedure well with no complications   ?Post-procedure details: wound care instructions given   ?Additional details:  Prior to procedure, discussed risks of blister formation, small wound, skin dyspigmentation, or rare scar following cryotherapy. Recommend Vaseline ointment to treated areas while healing. ? ? ?Lentigines ?- Scattered tan macules ?- Due to sun exposure ?- Benign-appearing, observe ?- Recommend daily broad spectrum sunscreen SPF 30+ to sun-exposed areas, reapply every 2 hours as needed. ?- Call for any changes ? ?Seborrheic Keratoses ?- Stuck-on, waxy, tan-brown papules and/or plaques  ?- Benign-appearing ?- Discussed benign etiology and prognosis. ?- Observe ?- Call for any changes ? ?Melanocytic Nevi ?- Tan-brown and/or pink-flesh-colored symmetric macules and papules ?- Benign appearing on exam today ?- Observation ?- Call clinic for new or changing moles ?- Recommend daily use of broad spectrum spf 30+ sunscreen to sun-exposed areas.  ? ?Hemangiomas ?- Red papules ?- Discussed benign nature ?- Observe ?- Call  for any changes ? ?Actinic Damage ?- Chronic condition, secondary to cumulative UV/sun exposure ?- diffuse scaly erythematous macules with underlying dyspigmentation ?- Recommend daily broad spectrum sunscreen SPF 30+ to sun-exposed areas, reapply every 2 hours as needed.  ?- Staying in the shade or wearing long sleeves, sun glasses (UVA+UVB protection) and wide brim hats (4-inch brim around the entire circumference of the hat) are also recommended for sun protection.  ?- Call for new or changing lesions. ? ?History of Basal Cell Carcinoma of the Skin ?- No evidence of recurrence today multiple sites see history  ?- Recommend regular full body skin exams ?- Recommend daily broad spectrum sunscreen SPF 30+ to sun-exposed areas, reapply every 2 hours as needed.  ?- Call if any new or changing lesions are noted between office visits ? ?Skin cancer screening performed today. ?Return for 3 month recheck left upper lip sccis . ?I, Ruthell Rummage, CMA, am acting as scribe for Brendolyn Patty, MD. ? ?Documentation: I have reviewed the above documentation for accuracy and completeness, and I agree with the above. ? ?Brendolyn Patty MD  ? ?

## 2021-10-28 NOTE — Patient Instructions (Addendum)
Start 5-fluorouracil/calcipotriene cream twice a day for 14 days to affected areas including left lower lip. Prescription sent to [facility]. Patient provided with contact information for pharmacy and advised the pharmacy will mail the prescription to their home. Patient provided with handout reviewing treatment course and side effects and advised to call or message [place] on MyChart with any concerns. ? ?5-fluorouracil/calcipotriene cream is is a type of field treatment used to treat precancers, thin skin cancers, and areas of sun damage. Expected reaction includes irritation and mild inflammation potentially progressing to more severe inflammation including redness, scaling, crusting and open sores/erosions.  If too much irritation occurs, ensure application of only a thin layer and decrease frequency of use to achieve a tolerable level of inflammation. Recommend applying Vaseline ointment to open sores as needed.  Minimize sun exposure while under treatment. Recommend daily broad spectrum sunscreen SPF 30+ to sun-exposed areas, reapply every 2 hours as needed. ? ?5-Fluorouracil/Calcipotriene Patient Education  ? ?Actinic keratoses are the dry, red scaly spots on the skin caused by sun damage. A portion of these spots can turn into skin cancer with time, and treating them can help prevent development of skin cancer.  ? ?Treatment of these spots requires removal of the defective skin cells. There are various ways to remove actinic keratoses, including freezing with liquid nitrogen, treatment with creams, or treatment with a blue light procedure in the office.  ? ?5-fluorouracil cream is a topical cream used to treat actinic keratoses. It works by interfering with the growth of abnormal fast-growing skin cells, such as actinic keratoses. These cells peel off and are replaced by healthy ones.  ? ?5-fluorouracil/calcipotriene is a combination of the 5-fluorouracil cream with a vitamin D analog cream called  calcipotriene. The calcipotriene alone does not treat actinic keratoses. However, when it is combined with 5-fluorouracil, it helps the 5-fluorouracil treat the actinic keratoses much faster so that the same results can be achieved with a much shorter treatment time. ? ?INSTRUCTIONS FOR 5-FLUOROURACIL/CALCIPOTRIENE CREAM:  ? ?5-fluorouracil/calcipotriene cream typically only needs to be used for 4-7 days. A thin layer should be applied twice a day to the treatment areas recommended by your physician.  ? ?If your physician prescribed you separate tubes of 5-fluourouracil and calcipotriene, apply a thin layer of 5-fluorouracil followed by a thin layer of calcipotriene.  ? ?Avoid contact with your eyes, nostrils, and mouth. Do not use 5-fluorouracil/calcipotriene cream on infected or open wounds.  ? ?You will develop redness, irritation and some crusting at areas where you have pre-cancer damage/actinic keratoses. IF YOU DEVELOP PAIN, BLEEDING, OR SIGNIFICANT CRUSTING, STOP THE TREATMENT EARLY - you have already gotten a good response and the actinic keratoses should clear up well. ? ?Wash your hands after applying 5-fluorouracil 5% cream on your skin.  ? ?A moisturizer or sunscreen with a minimum SPF 30 should be applied each morning.  ? ?Once you have finished the treatment, you can apply a thin layer of Vaseline twice a day to irritated areas to soothe and calm the areas more quickly. If you experience significant discomfort, contact your physician. ? ?For some patients it is necessary to repeat the treatment for best results. ? ?SIDE EFFECTS: When using 5-fluorouracil/calcipotriene cream, you may have mild irritation, such as redness, dryness, swelling, or a mild burning sensation. This usually resolves within 2 weeks. The more actinic keratoses you have, the more redness and inflammation you can expect during treatment. Eye irritation has been reported rarely. If this occurs, please  let us know.  ?If you have any  trouble using this cream, please call the office. If you have any other questions about this information, please do not hesitate to ask me before you leave the office. ? ? ? ?Cryotherapy Aftercare ? ?Wash gently with soap and water everyday.   ?Apply Vaseline and Band-Aid daily until healed.  ? ? ? ? ?Seborrheic Keratosis ? ?What causes seborrheic keratoses? ?Seborrheic keratoses are harmless, common skin growths that first appear during adult life.  As time goes by, more growths appear.  Some people may develop a large number of them.  Seborrheic keratoses appear on both covered and uncovered body parts.  They are not caused by sunlight.  The tendency to develop seborrheic keratoses can be inherited.  They vary in color from skin-colored to gray, brown, or even black.  They can be either smooth or have a rough, warty surface.   ?Seborrheic keratoses are superficial and look as if they were stuck on the skin.  Under the microscope this type of keratosis looks like layers upon layers of skin.  That is why at times the top layer may seem to fall off, but the rest of the growth remains and re-grows.   ? ?Treatment ?Seborrheic keratoses do not need to be treated, but can easily be removed in the office.  Seborrheic keratoses often cause symptoms when they rub on clothing or jewelry.  Lesions can be in the way of shaving.  If they become inflamed, they can cause itching, soreness, or burning.  Removal of a seborrheic keratosis can be accomplished by freezing, burning, or surgery. ?If any spot bleeds, scabs, or grows rapidly, please return to have it checked, as these can be an indication of a skin cancer. ? ? ? ? ?Melanoma ABCDEs ? ?Melanoma is the most dangerous type of skin cancer, and is the leading cause of death from skin disease.  You are more likely to develop melanoma if you: ?Have light-colored skin, light-colored eyes, or red or blond hair ?Spend a lot of time in the sun ?Tan regularly, either outdoors or in a  tanning bed ?Have had blistering sunburns, especially during childhood ?Have a close family member who has had a melanoma ?Have atypical moles or large birthmarks ? ?Early detection of melanoma is key since treatment is typically straightforward and cure rates are extremely high if we catch it early.  ? ?The first sign of melanoma is often a change in a mole or a new dark spot.  The ABCDE system is a way of remembering the signs of melanoma. ? ?A for asymmetry:  The two halves do not match. ?B for border:  The edges of the growth are irregular. ?C for color:  A mixture of colors are present instead of an even brown color. ?D for diameter:  Melanomas are usually (but not always) greater than 62m - the size of a pencil eraser. ?E for evolution:  The spot keeps changing in size, shape, and color. ? ?Please check your skin once per month between visits. You can use a small mirror in front and a large mirror behind you to keep an eye on the back side or your body.  ? ?If you see any new or changing lesions before your next follow-up, please call to schedule a visit. ? ?Please continue daily skin protection including broad spectrum sunscreen SPF 30+ to sun-exposed areas, reapplying every 2 hours as needed when you're outdoors.   ? ? ? ? ? ? ? ? ? ? ?  If You Need Anything After Your Visit ? ?If you have any questions or concerns for your doctor, please call our main line at [phone] and press option 4 to reach your doctor's medical assistant. If no one answers, please leave a voicemail as directed and we will return your call as soon as possible. Messages left after 4 pm will be answered the following business day.  ? ?You may also send [place] a message via MyChart. We typically respond to MyChart messages within 1-2 business days. ? ?For prescription refills, please ask your pharmacy to contact our office. Our fax number is [phone]. ? ?If you have an urgent issue when the clinic is closed that cannot wait until the  next business day, you can page your doctor at the number below.   ? ?Please note that while we do our best to be available for urgent issues outside of office hours, we are not available 24/7.  ? ?If you have

## 2021-11-17 ENCOUNTER — Other Ambulatory Visit: Payer: Self-pay | Admitting: Family Medicine

## 2021-12-02 ENCOUNTER — Ambulatory Visit (INDEPENDENT_AMBULATORY_CARE_PROVIDER_SITE_OTHER): Payer: Medicare Other

## 2021-12-02 DIAGNOSIS — Z0000 Encounter for general adult medical examination without abnormal findings: Secondary | ICD-10-CM | POA: Diagnosis not present

## 2021-12-02 NOTE — Patient Instructions (Signed)
Mr. Jimmy Green , ?Thank you for taking time to come for your Medicare Wellness Visit. I appreciate your ongoing commitment to your health goals. Please review the following plan we discussed and let me know if I can assist you in the future.  ? ?Screening recommendations/referrals: ?Colonoscopy: done 08/29/20. Repeat 08/2027 ?Recommended yearly ophthalmology/optometry visit for glaucoma screening and checkup ?Recommended yearly dental visit for hygiene and checkup ? ?Vaccinations: ?Influenza vaccine: done 03/31/21 ?Pneumococcal vaccine: done 04/07/17 ?Tdap vaccine: done 04/03/14 ?Shingles vaccine: done 12/16/17   ?Covid-19: done 08/25/19, 09/14/19 & 05/20/20 ? ?Advanced directives: Advance directive discussed with you today. I have provided a copy for you to complete at home and have notarized. Once this is complete please bring a copy in to our office so we can scan it into your chart.  ? ?Conditions/risks identified: Keep up the great work! ? ? ?Next appointment: Follow up in one year for your annual wellness visit.  ? ?Preventive Care 4 Years and Older, Male ?Preventive care refers to lifestyle choices and visits with your health care provider that can promote health and wellness. ?What does preventive care include? ?A yearly physical exam. This is also called an annual well check. ?Dental exams once or twice a year. ?Routine eye exams. Ask your health care provider how often you should have your eyes checked. ?Personal lifestyle choices, including: ?Daily care of your teeth and gums. ?Regular physical activity. ?Eating a healthy diet. ?Avoiding tobacco and drug use. ?Limiting alcohol use. ?Practicing safe sex. ?Taking low doses of aspirin every day. ?Taking vitamin and mineral supplements as recommended by your health care provider. ?What happens during an annual well check? ?The services and screenings done by your health care provider during your annual well check will depend on your age, overall health, lifestyle risk  factors, and family history of disease. ?Counseling  ?Your health care provider may ask you questions about your: ?Alcohol use. ?Tobacco use. ?Drug use. ?Emotional well-being. ?Home and relationship well-being. ?Sexual activity. ?Eating habits. ?History of falls. ?Memory and ability to understand (cognition). ?Work and work Statistician. ?Screening  ?You may have the following tests or measurements: ?Height, weight, and BMI. ?Blood pressure. ?Lipid and cholesterol levels. These may be checked every 5 years, or more frequently if you are over 76 years old. ?Skin check. ?Lung cancer screening. You may have this screening every year starting at age 69 if you have a 30-pack-year history of smoking and currently smoke or have quit within the past 15 years. ?Fecal occult blood test (FOBT) of the stool. You may have this test every year starting at age 53. ?Flexible sigmoidoscopy or colonoscopy. You may have a sigmoidoscopy every 5 years or a colonoscopy every 10 years starting at age 30. ?Prostate cancer screening. Recommendations will vary depending on your family history and other risks. ?Hepatitis C blood test. ?Hepatitis B blood test. ?Sexually transmitted disease (STD) testing. ?Diabetes screening. This is done by checking your blood sugar (glucose) after you have not eaten for a while (fasting). You may have this done every 1-3 years. ?Abdominal aortic aneurysm (AAA) screening. You may need this if you are a current or former smoker. ?Osteoporosis. You may be screened starting at age 51 if you are at high risk. ?Talk with your health care provider about your test results, treatment options, and if necessary, the need for more tests. ?Vaccines  ?Your health care provider may recommend certain vaccines, such as: ?Influenza vaccine. This is recommended every year. ?Tetanus, diphtheria, and acellular pertussis (  Tdap, Td) vaccine. You may need a Td booster every 10 years. ?Zoster vaccine. You may need this after age  64. ?Pneumococcal 13-valent conjugate (PCV13) vaccine. One dose is recommended after age 5. ?Pneumococcal polysaccharide (PPSV23) vaccine. One dose is recommended after age 51. ?Talk to your health care provider about which screenings and vaccines you need and how often you need them. ?This information is not intended to replace advice given to you by your health care provider. Make sure you discuss any questions you have with your health care provider. ?Document Released: 08/16/2015 Document Revised: 04/08/2016 Document Reviewed: 05/21/2015 ?Elsevier Interactive Patient Education ? 2017 [place]. ? ?Fall Prevention in the Home ?Falls can cause injuries. They can happen to people of all ages. There are many things you can do to make your home safe and to help prevent falls. ?What can I do on the outside of my home? ?Regularly fix the edges of walkways and driveways and fix any cracks. ?Remove anything that might make you trip as you walk through a door, such as a raised step or threshold. ?Trim any bushes or trees on the path to your home. ?Use bright outdoor lighting. ?Clear any walking paths of anything that might make someone trip, such as rocks or tools. ?Regularly check to see if handrails are loose or broken. Make sure that both sides of any steps have handrails. ?Any raised decks and porches should have guardrails on the edges. ?Have any leaves, snow, or ice cleared regularly. ?Use sand or salt on walking paths during winter. ?Clean up any spills in your garage right away. This includes oil or grease spills. ?What can I do in the bathroom? ?Use night lights. ?Install grab bars by the toilet and in the tub and shower. Do not use towel bars as grab bars. ?Use non-skid mats or decals in the tub or shower. ?If you need to sit down in the shower, use a plastic, non-slip stool. ?Keep the floor dry. Clean up any water that spills on the floor as soon as it happens. ?Remove soap buildup in the tub or shower  regularly. ?Attach bath mats securely with double-sided non-slip rug tape. ?Do not have throw rugs and other things on the floor that can make you trip. ?What can I do in the bedroom? ?Use night lights. ?Make sure that you have a light by your bed that is easy to reach. ?Do not use any sheets or blankets that are too big for your bed. They should not hang down onto the floor. ?Have a firm chair that has side arms. You can use this for support while you get dressed. ?Do not have throw rugs and other things on the floor that can make you trip. ?What can I do in the kitchen? ?Clean up any spills right away. ?Avoid walking on wet floors. ?Keep items that you use a lot in easy-to-reach places. ?If you need to reach something above you, use a strong step stool that has a grab bar. ?Keep electrical cords out of the way. ?Do not use floor polish or wax that makes floors slippery. If you must use wax, use non-skid floor wax. ?Do not have throw rugs and other things on the floor that can make you trip. ?What can I do with my stairs? ?Do not leave any items on the stairs. ?Make sure that there are handrails on both sides of the stairs and use them. Fix handrails that are broken or loose. Make sure that handrails are  as long as the stairways. ?Check any carpeting to make sure that it is firmly attached to the stairs. Fix any carpet that is loose or worn. ?Avoid having throw rugs at the top or bottom of the stairs. If you do have throw rugs, attach them to the floor with carpet tape. ?Make sure that you have a light switch at the top of the stairs and the bottom of the stairs. If you do not have them, ask someone to add them for you. ?What else can I do to help prevent falls? ?Wear shoes that: ?Do not have high heels. ?Have rubber bottoms. ?Are comfortable and fit you well. ?Are closed at the toe. Do not wear sandals. ?If you use a stepladder: ?Make sure that it is fully opened. Do not climb a closed stepladder. ?Make sure that  both sides of the stepladder are locked into place. ?Ask someone to hold it for you, if possible. ?Clearly mark and make sure that you can see: ?Any grab bars or handrails. ?First and last steps. ?Where the edge

## 2021-12-02 NOTE — Progress Notes (Signed)
? ?Subjective:  ? Jimmy Green is a 71 y.o. male who presents for Medicare Annual/Subsequent preventive examination. ? ?Virtual Visit via Telephone Note ? ?I connected with  Jimmy Green on 12/02/21 at  1:45 PM EDT by telephone and verified that I am speaking with the correct person using two identifiers. ? ?Location: ?Patient: home ?Provider: [place] ?Persons participating in the virtual visit: patient/Nurse Health Advisor ?  ?I discussed the limitations, risks, security and privacy concerns of performing an evaluation and management service by telephone and the availability of in person appointments. The patient expressed understanding and agreed to proceed. ? ?Interactive audio and video telecommunications were attempted between this nurse and patient, however failed, due to patient having technical difficulties OR patient did not have access to video capability.  We continued and completed visit with audio only. ? ?Some vital signs may be absent or patient reported.  ? ?Clemetine Marker, LPN ?  ? ?Review of Systems    ? ?Cardiac Risk Factors include: advanced age (>69mn, >>13women);diabetes mellitus;dyslipidemia;male gender;hypertension;obesity (BMI >30kg/m2) ? ?   ?Objective:  ?  ?There were no vitals filed for this visit. ?There is no height or weight on file to calculate BMI. ? ? ?  12/02/2021  ?  1:45 PM 09/12/2020  ?  8:27 AM 08/29/2020  ?  8:36 AM 06/23/2019  ?  8:54 AM 06/16/2018  ? 11:16 AM 05/21/2017  ? 10:35 AM 05/11/2017  ? 11:12 AM  ?Advanced Directives  ?Does Patient Have a Medical Advance Directive? No No No No No No No  ?Does patient want to make changes to medical advance directive?    No - Patient declined     ?Would patient like information on creating a medical advance directive? Yes (MAU/Ambulatory/Procedural Areas - Information given) No - Patient declined   Yes (MAU/Ambulatory/Procedural Areas - Information given)    ? ? ?Current Medications (verified) ?Outpatient Encounter Medications as of  12/02/2021  ?Medication Sig  ? amLODipine (NORVASC) 2.5 MG tablet Take 1 tablet (2.5 mg total) by mouth daily.  ? aspirin EC 81 MG tablet Take 81 mg by mouth every morning.   ? buPROPion (WELLBUTRIN XL) 150 MG 24 hr tablet Take 150 mg by mouth See admin instructions. Take with 300 mg for a total of 450 mg daily  ? buPROPion (WELLBUTRIN XL) 300 MG 24 hr tablet Take 1 tablet (300 mg total) by mouth daily. (Patient taking differently: Take 300 mg by mouth See admin instructions. Take with 150 mg for a total of 450 mg daily)  ? dapagliflozin propanediol (FARXIGA) 10 MG TABS tablet Take 1 tablet (10 mg total) by mouth daily before breakfast.  ? Erenumab-aooe (AIMOVIG) 140 MG/ML SOAJ Inject 140 mg into the skin every 30 (thirty) days.  ? escitalopram (LEXAPRO) 20 MG tablet Take 1 tablet (20 mg total) by mouth daily.  ? fluorouracil (EFUDEX) 5 % cream Apply topically 2 (two) times daily. Apply to area for 14 days to left lower lip  ? gabapentin (NEURONTIN) 300 MG capsule Take 1 capsule (300 mg total) by mouth at bedtime. (Patient taking differently: Take 1,500-2,100 mg by mouth daily as needed (Restless leg syndrom).)  ? ketoconazole (NIZORAL) 2 % cream Apply to rash behind ears 1-2 times a day as directed.  ? lidocaine (LIDODERM) 5 % 1-2 patches every 12 (twelve) hours. If tolerated  ? metFORMIN (GLUCOPHAGE-XR) 750 MG 24 hr tablet Take 2 tablets (1,500 mg total) by mouth daily with breakfast.  ?  metoprolol succinate (TOPROL-XL) 50 MG 24 hr tablet Take 1 tablet (50 mg total) by mouth every evening. Take with or immediately following a meal.  ? metroNIDAZOLE (METROGEL) 0.75 % gel Apply to face 1-2 times a day for roscea.  ? olmesartan-hydrochlorothiazide (BENICAR HCT) 40-25 MG tablet Take 1 tablet by mouth daily.  ? omeprazole (PRILOSEC) 40 MG capsule Take 1 capsule (40 mg total) by mouth daily.  ? oxyCODONE (ROXICODONE) 15 MG immediate release tablet Take 7.5-15 mg by mouth 5 (five) times daily as needed for pain.  ?  rosuvastatin (CRESTOR) 40 MG tablet Take 1 tablet (40 mg total) by mouth daily.  ? Accu-Chek FastClix Lancets MISC USE AS INSTRUCTED ONCE DAILY (Patient not taking: Reported on 12/02/2021)  ? ACCU-CHEK GUIDE test strip USE AS INSTRUCTED ONCE DAILY (Patient not taking: Reported on 12/02/2021)  ? diclofenac Sodium (VOLTAREN) 1 % GEL SMARTSIG:2-4 Gram(s) Topical 4 Times Daily (Patient not taking: Reported on 12/02/2021)  ? NARCAN 4 MG/0.1ML LIQD nasal spray kit 0.4 mg once.  ? nitroGLYCERIN (NITROSTAT) 0.4 MG SL tablet DISSOLVE 1 TABLET UNDER THE TONGUE EVERY 5 MINUTES FOR UP TO 3 DOSES AS NEEDED FOR CHEST PAIN. IF NO RELIEF AFTER 3 DOSES, CALL 911 OR GO TO ER.  ? ?No facility-administered encounter medications on file as of 12/02/2021.  ? ? ?Allergies (verified) ?Shingrix [zoster vac recomb adjuvanted], Elavil [amitriptyline hcl], and Tape  ? ?History: ?Past Medical History:  ?Diagnosis Date  ? Anginal pain ([place])   ? Basal cell carcinoma 02/21/2018  ? left posterior shoulder at lat edge of scar  ? Basal cell carcinoma 03/11/2021  ? Left upper back medial  SUPERFICIAL AND NODULAR PATTERNS, BASE INVOLVED, appears clear with biopsy  ? Basal cell carcinoma   ? left posterior shoulder, excised by Dr Fleet Contras in the past.  ? BCC (basal cell carcinoma of skin) 03/11/2021  ? left upper back lateral - SUPERFICIAL BASAL CELL CARCINOMA. Clear with biopsy.  ? BCC (basal cell carcinoma of skin) 03/11/2021  ? right mid back - BASAL CELL CARCINOMA, clear with biopsy.  ? Coronary artery disease   ? Depression   ? Diabetes mellitus without complication ([place])   ? GERD (gastroesophageal reflux disease)   ? Hyperlipidemia   ? Hypertension   ? Hypogonadism in male   ? Metabolic syndrome   ? Myocardial infarction [facility])   ? Orthopnea   ? Seborrheic keratosis   ? Skin cancer 2014  ? nose and right knee  ? Sleep apnea   ? Squamous cell carcinoma of skin 06/17/2021  ? in situ, patient needs shave removal and poss 5FU.  ? ?Past Surgical History:   ?Procedure Laterality Date  ? BACK SURGERY    ? BASAL CELL CARCINOMA EXCISION Left 12/02/2015  ? Chest-Done by Dermatologist   ? BASAL CELL CARCINOMA EXCISION Left 02/21/2018  ? Nodulocytic pattern, deep margin involved - [facility] - Dr. Brendolyn Patty  ? BASAL CELL CARCINOMA EXCISION  02/23/2018  ? CARDIAC CATHETERIZATION Left 04/01/2016  ? Procedure: Left Heart Cath and Coronary Angiography;  Surgeon: Teodoro Spray, MD;  Location: [place] CV LAB;  Service: Cardiovascular;  Laterality: Left;  ? CARDIAC CATHETERIZATION N/A 04/01/2016  ? Procedure: Intravascular Pressure Wire/FFR Study;  Surgeon: Isaias Cowman, MD;  Location: [place] CV LAB;  Service: Cardiovascular;  Laterality: N/A;  ? COLONOSCOPY N/A 01/15/2015  ? Wohl-ileitis, 2 benign polyps, cryptitis, sigmoid diverticulosis, focal ulceration ICV  ? COLONOSCOPY WITH PROPOFOL N/A 08/29/2020  ?  Procedure: COLONOSCOPY WITH PROPOFOL;  Surgeon: Lucilla Lame, MD;  Location: [facility] ENDOSCOPY;  Service: Endoscopy;  Laterality: N/A;  ? CORONARY STENT PLACEMENT    ? FRACTIONAL FLOW RESERVE WIRE  10/08/2011  ? Procedure: FRACTIONAL FLOW RESERVE WIRE;  Surgeon: Clent Demark, MD;  Location: [facility] CATH LAB;  Service: Cardiovascular;;  ? HERNIA REPAIR    ? INTRAVASCULAR PRESSURE WIRE/FFR STUDY N/A 09/09/2021  ? Procedure: INTRAVASCULAR PRESSURE WIRE/FFR STUDY;  Surgeon: Andrez Grime, MD;  Location: [place] CV LAB;  Service: Cardiovascular;  Laterality: N/A;  ? KNEE SURGERY    ? LEFT HEART CATH AND CORONARY ANGIOGRAPHY N/A 09/09/2021  ? Procedure: LEFT HEART CATH AND CORONARY ANGIOGRAPHY;  Surgeon: Andrez Grime, MD;  Location: [place] CV LAB;  Service: Cardiovascular;  Laterality: N/A;  ? LEFT HEART CATHETERIZATION WITH CORONARY ANGIOGRAM N/A 10/08/2011  ? Procedure: LEFT HEART CATHETERIZATION WITH CORONARY ANGIOGRAM;  Surgeon: Clent Demark, MD;  Location: [facility] CATH LAB;  Service: Cardiovascular;  Laterality: N/A;  ? parodectomy Left  05/07/2020  ? SKIN CANCER EXCISION  01/2018  ? [facility]  ? TOTAL HIP ARTHROPLASTY Left 04/06/2017  ? Procedure: TOTAL HIP ARTHROPLASTY ANTERIOR APPROACH;  Surgeon: Hessie Knows, MD;  Location: [facility] ORS;

## 2021-12-09 NOTE — Progress Notes (Signed)
Name: Jimmy Green   MRN: [id number]    DOB: 27-Oct-1950   Date:12/10/2021 ? ?     Progress Note ? ?Subjective ? ?Chief Complaint ? ?Follow Up ? ?HPI ? ?HTN: bp is at goal, he is taking Benicar hctz 40/25 and has noticed increase in urinary frequency , BP is towards low end of normal and we will adjust dose to 40/12.5 mg He also takes metoprolo and Norvasc l. He denies chest pain or palpitation at this time  ? ?DM: he has not been checking his glucose lately, A1C back in August  was 8.6 % down  to  7.1 % and today is 6.7 %  , he is on Farxiga and Metformin since he could not tolerate Rybelsus. He has associated obesity, dyslipidemia, HTN. He is on statin therapy and Benicar  ? ?Depression Major Recurrent: symptoms started when he lost his job in 2016, he is back on Wellbutrin 450  mg and Lexapro 20 mg daily. He has noticed difficulty staying asleep, we will try adding Seroquel  ? ?CAD with Angina: medically managed, on beta blocker and statin therapy , no pain at this time , denies SOB. He is under the care of cardiologist. Denies chest pain, palpitation he has stable decrease in exercise tolerance - moderate activity  ? ?History of left internal jugular vein thrombosis: he was seen by vascular surgeon and took aspirin and Eliquis for 3 months, repeat US was clear and he has been off blood thinners since Nov 2022. 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 Stable  ? ?Headaches with migraine features: usually above right eye, throbbing like and aching, he has been doing well on Aimovig once a month and states headache well controlled with medication, usually has an episode two days prior to monthly dose but resolves with medication. Unchanged  ? ?Obesity: doing better, BMI is now below 35  . He is eating only once a day and smaller portions.  ? ?Patient Active Problem List  ? Diagnosis Date Noted  ? Morbid obesity ([place]) 08/12/2021  ? 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 History of DVT in  adulthood 03/03/2021  ? Diabetes ([place]) 03/03/2021  ? Personal history of colonic polyps   ? Polyp of transverse colon   ? 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 History of basal cell carcinoma 03/23/2018  ? Primary localized osteoarthritis of left hip 04/06/2017  ? Dyslipidemia associated with type 2 diabetes mellitus ([place]) 10/04/2016  ? Trigger thumb of both thumbs 06/02/2016  ? Chronic tension-type headache, intractable 09/25/2015  ? Benign paroxysmal positional vertigo due to bilateral vestibular disorder 09/25/2015  ? Hearing loss sensory, bilateral 08/26/2015  ? Cerebral microvascular disease 08/26/2015  ? Metabolic syndrome 88/82/8003  ? Vertigo 06/21/2015  ? Buzzing in ear 06/21/2015  ? Hypertension goal BP (blood pressure) < 140/90 03/28/2015  ? GERD (gastroesophageal reflux disease) 03/28/2015  ? Hyperlipidemia 03/28/2015  ? Depression, major 03/28/2015  ? CAD in native artery 03/28/2015  ? Actinic keratosis 02/14/2015  ? Obstructive sleep apnea 02/03/2015  ? Diverticulosis of colon without hemorrhage 01/31/2015  ? Hx of colonic polyps   ? Benign neoplasm of descending colon   ? Benign neoplasm of sigmoid colon   ? Idiopathic colitis   ? Sebaceous cyst 09/14/2014  ? ? ?Past Surgical History:  ?Procedure Laterality Date  ? BACK SURGERY    ? BASAL CELL CARCINOMA EXCISION Left 12/02/2015  ? Chest-Done by Dermatologist   ? BASAL  CELL CARCINOMA EXCISION Left 02/21/2018  ? Nodulocytic pattern, deep margin involved - [facility] - Dr. Brendolyn Patty  ? BASAL CELL CARCINOMA EXCISION  02/23/2018  ? CARDIAC CATHETERIZATION Left 04/01/2016  ? Procedure: Left Heart Cath and Coronary Angiography;  Surgeon: Teodoro Spray, MD;  Location: [place] CV LAB;  Service: Cardiovascular;  Laterality: Left;  ? CARDIAC CATHETERIZATION N/A 04/01/2016  ? Procedure: Intravascular Pressure Wire/FFR Study;  Surgeon: Isaias Cowman, MD;  Location: [place] CV LAB;  Service: Cardiovascular;   Laterality: N/A;  ? COLONOSCOPY N/A 01/15/2015  ? Wohl-ileitis, 2 benign polyps, cryptitis, sigmoid diverticulosis, focal ulceration ICV  ? COLONOSCOPY WITH PROPOFOL N/A 08/29/2020  ? Procedure: COLONOSCOPY WITH PROPOFOL;  Surgeon: Lucilla Lame, MD;  Location: [facility] ENDOSCOPY;  Service: Endoscopy;  Laterality: N/A;  ? CORONARY STENT PLACEMENT    ? FRACTIONAL FLOW RESERVE WIRE  10/08/2011  ? Procedure: FRACTIONAL FLOW RESERVE WIRE;  Surgeon: Clent Demark, MD;  Location: [facility] CATH LAB;  Service: Cardiovascular;;  ? HERNIA REPAIR    ? INTRAVASCULAR PRESSURE WIRE/FFR STUDY N/A 09/09/2021  ? Procedure: INTRAVASCULAR PRESSURE WIRE/FFR STUDY;  Surgeon: Andrez Grime, MD;  Location: [place] CV LAB;  Service: Cardiovascular;  Laterality: N/A;  ? KNEE SURGERY    ? LEFT HEART CATH AND CORONARY ANGIOGRAPHY N/A 09/09/2021  ? Procedure: LEFT HEART CATH AND CORONARY ANGIOGRAPHY;  Surgeon: Andrez Grime, MD;  Location: [place] CV LAB;  Service: Cardiovascular;  Laterality: N/A;  ? LEFT HEART CATHETERIZATION WITH CORONARY ANGIOGRAM N/A 10/08/2011  ? Procedure: LEFT HEART CATHETERIZATION WITH CORONARY ANGIOGRAM;  Surgeon: Clent Demark, MD;  Location: [facility] CATH LAB;  Service: Cardiovascular;  Laterality: N/A;  ? parodectomy Left 05/07/2020  ? SKIN CANCER EXCISION  01/2018  ? [facility]  ? TOTAL HIP ARTHROPLASTY Left 04/06/2017  ? Procedure: TOTAL HIP ARTHROPLASTY ANTERIOR APPROACH;  Surgeon: Hessie Knows, MD;  Location: [facility] ORS;  Service: Orthopedics;  Laterality: Left;  ? ? ?Family History  ?Problem Relation Age of Onset  ? Lung cancer Father   ? Heart disease Father   ? Skin cancer Father   ? ? ?Social History  ? ?Tobacco Use  ? Smoking status: Former  ?  Years: 40.00  ?  Types: Cigarettes  ?  Quit date: 08/03/2012  ?  Years since quitting: 9.3  ? Smokeless tobacco: Never  ?Substance Use Topics  ? Alcohol use: No  ?  Alcohol/week: 0.0 standard drinks  ? ? ? ?Current Outpatient Medications:  ?  Accu-Chek  FastClix Lancets MISC, USE AS INSTRUCTED ONCE DAILY, Disp: 102 each, Rfl: 12 ?  ACCU-CHEK GUIDE test strip, USE AS INSTRUCTED ONCE DAILY, Disp: 100 strip, Rfl: 12 ?  amLODipine (NORVASC) 2.5 MG tablet, Take 1 tablet (2.5 mg total) by mouth daily., Disp: 90 tablet, Rfl: 1 ?  aspirin EC 81 MG tablet, Take 81 mg by mouth every morning. , Disp: , Rfl:  ?  buPROPion (WELLBUTRIN XL) 150 MG 24 hr tablet, Take 150 mg by mouth See admin instructions. Take with 300 mg for a total of 450 mg daily, Disp: , Rfl:  ?  buPROPion (WELLBUTRIN XL) 300 MG 24 hr tablet, Take 1 tablet (300 mg total) by mouth daily. (Patient taking differently: Take 300 mg by mouth See admin instructions. Take with 150 mg for a total of 450 mg daily), Disp: 90 tablet, Rfl: 1 ?  dapagliflozin propanediol (FARXIGA) 10 MG TABS tablet, Take 1 tablet (10 mg total) by  mouth daily before breakfast., Disp: 90 tablet, Rfl: 1 ?  diclofenac Sodium (VOLTAREN) 1 % GEL, , Disp: , Rfl:  ?  Erenumab-aooe (AIMOVIG) 140 MG/ML SOAJ, Inject 140 mg into the skin every 30 (thirty) days., Disp: 1.12 mL, Rfl: 5 ?  escitalopram (LEXAPRO) 20 MG tablet, Take 1 tablet (20 mg total) by mouth daily., Disp: 90 tablet, Rfl: 1 ?  fluorouracil (EFUDEX) 5 % cream, Apply topically 2 (two) times daily. Apply to area for 14 days to left lower lip, Disp: 15 g, Rfl: 1 ?  gabapentin (NEURONTIN) 300 MG capsule, Take 1 capsule (300 mg total) by mouth at bedtime. (Patient taking differently: Take 1,500-2,100 mg by mouth daily as needed (Restless leg syndrom).), Disp: 90 capsule, Rfl: 1 ?  ketoconazole (NIZORAL) 2 % cream, Apply to rash behind ears 1-2 times a day as directed., Disp: 30 g, Rfl: 2 ?  lidocaine (LIDODERM) 5 %, 1-2 patches every 12 (twelve) hours. If tolerated, Disp: , Rfl:  ?  metFORMIN (GLUCOPHAGE-XR) 750 MG 24 hr tablet, Take 2 tablets (1,500 mg total) by mouth daily with breakfast., Disp: 180 tablet, Rfl: 1 ?  metoprolol succinate (TOPROL-XL) 50 MG 24 hr tablet, Take 1 tablet (50  mg total) by mouth every evening. Take with or immediately following a meal., Disp: 90 tablet, Rfl: 1 ?  metroNIDAZOLE (METROGEL) 0.75 % gel, Apply to face 1-2 times a day for roscea., Disp: 45 g, Rfl: 5 ?  NARCAN 4

## 2021-12-10 ENCOUNTER — Encounter: Payer: Self-pay | Admitting: Family Medicine

## 2021-12-10 ENCOUNTER — Ambulatory Visit (INDEPENDENT_AMBULATORY_CARE_PROVIDER_SITE_OTHER): Payer: Medicare Other | Admitting: Family Medicine

## 2021-12-10 VITALS — BP 122/62 | HR 66 | Resp 16 | Ht 67.0 in | Wt 222.0 lb

## 2021-12-10 DIAGNOSIS — F3340 Major depressive disorder, recurrent, in remission, unspecified: Secondary | ICD-10-CM

## 2021-12-10 DIAGNOSIS — I2510 Atherosclerotic heart disease of native coronary artery without angina pectoris: Secondary | ICD-10-CM

## 2021-12-10 DIAGNOSIS — F5105 Insomnia due to other mental disorder: Secondary | ICD-10-CM

## 2021-12-10 DIAGNOSIS — I200 Unstable angina: Secondary | ICD-10-CM | POA: Diagnosis not present

## 2021-12-10 DIAGNOSIS — I10 Essential (primary) hypertension: Secondary | ICD-10-CM

## 2021-12-10 DIAGNOSIS — E785 Hyperlipidemia, unspecified: Secondary | ICD-10-CM

## 2021-12-10 DIAGNOSIS — F99 Mental disorder, not otherwise specified: Secondary | ICD-10-CM

## 2021-12-10 DIAGNOSIS — N401 Enlarged prostate with lower urinary tract symptoms: Secondary | ICD-10-CM

## 2021-12-10 DIAGNOSIS — E538 Deficiency of other specified B group vitamins: Secondary | ICD-10-CM

## 2021-12-10 DIAGNOSIS — I152 Hypertension secondary to endocrine disorders: Secondary | ICD-10-CM

## 2021-12-10 DIAGNOSIS — K219 Gastro-esophageal reflux disease without esophagitis: Secondary | ICD-10-CM

## 2021-12-10 DIAGNOSIS — N138 Other obstructive and reflux uropathy: Secondary | ICD-10-CM

## 2021-12-10 DIAGNOSIS — E1159 Type 2 diabetes mellitus with other circulatory complications: Secondary | ICD-10-CM | POA: Diagnosis not present

## 2021-12-10 DIAGNOSIS — E1169 Type 2 diabetes mellitus with other specified complication: Secondary | ICD-10-CM

## 2021-12-10 DIAGNOSIS — G2581 Restless legs syndrome: Secondary | ICD-10-CM

## 2021-12-10 DIAGNOSIS — G43009 Migraine without aura, not intractable, without status migrainosus: Secondary | ICD-10-CM

## 2021-12-10 DIAGNOSIS — E669 Obesity, unspecified: Secondary | ICD-10-CM

## 2021-12-10 DIAGNOSIS — D099 Carcinoma in situ, unspecified: Secondary | ICD-10-CM

## 2021-12-10 LAB — POCT GLYCOSYLATED HEMOGLOBIN (HGB A1C): Hemoglobin A1C: 6.7 % — AB (ref 4.0–5.6)

## 2021-12-10 MED ORDER — BUPROPION HCL ER (XL) 300 MG PO TB24: 300.0000 mg | ORAL_TABLET | ORAL | 1 refills | Status: DC

## 2021-12-10 MED ORDER — OLMESARTAN MEDOXOMIL-HCTZ 40-12.5 MG PO TABS: 1.0000 | ORAL_TABLET | Freq: Every day | ORAL | 1 refills | Status: DC

## 2021-12-10 MED ORDER — METFORMIN HCL ER 750 MG PO TB24: 1500.0000 mg | ORAL_TABLET | Freq: Every day | ORAL | 1 refills | Status: DC

## 2021-12-10 MED ORDER — BUPROPION HCL ER (XL) 150 MG PO TB24: 150.0000 mg | ORAL_TABLET | ORAL | 1 refills | Status: DC

## 2021-12-10 MED ORDER — AMLODIPINE BESYLATE 2.5 MG PO TABS: 2.5000 mg | ORAL_TABLET | Freq: Every day | ORAL | 1 refills | Status: DC

## 2021-12-10 MED ORDER — ESCITALOPRAM OXALATE 20 MG PO TABS: 20.0000 mg | ORAL_TABLET | Freq: Every day | ORAL | 1 refills | Status: DC

## 2021-12-10 MED ORDER — AIMOVIG 140 MG/ML ~~LOC~~ SOAJ: 1.0000 | SUBCUTANEOUS | 5 refills | Status: DC

## 2021-12-10 MED ORDER — DAPAGLIFLOZIN PROPANEDIOL 10 MG PO TABS: 10.0000 mg | ORAL_TABLET | Freq: Every day | ORAL | 1 refills | Status: DC

## 2021-12-10 MED ORDER — QUETIAPINE FUMARATE 25 MG PO TABS
25.0000 mg | ORAL_TABLET | Freq: Every day | ORAL | 0 refills | Status: DC
Start: 2021-12-10 — End: 2022-03-13

## 2021-12-10 MED ORDER — METOPROLOL SUCCINATE ER 50 MG PO TB24: 50.0000 mg | ORAL_TABLET | Freq: Every evening | ORAL | 1 refills | Status: DC

## 2021-12-10 MED ORDER — OMEPRAZOLE 40 MG PO CPDR: 40.0000 mg | DELAYED_RELEASE_CAPSULE | Freq: Every day | ORAL | 1 refills | Status: DC

## 2021-12-10 NOTE — Assessment & Plan Note (Signed)
Continue statin therapy, LDL is at goal  ?

## 2021-12-10 NOTE — Assessment & Plan Note (Signed)
Controlled with PPI

## 2021-12-10 NOTE — Assessment & Plan Note (Signed)
BP towards low end of norma and he has nocturia, we will decrease dose of Benicar hctz from 40/25 to 40/12.5 and continue Norvasc , metoprolol same dose ?

## 2021-12-10 NOTE — Assessment & Plan Note (Signed)
He has noticed difficulty staying asleep, otc medication not working we will add seroquel qhs ?

## 2021-12-10 NOTE — Assessment & Plan Note (Addendum)
He goes to cardiologist at [facility]  ?He had a stent placed around 2013, last cath done 09/2021 has 70 % occlusion mid LAD ? ?

## 2022-02-23 ENCOUNTER — Ambulatory Visit: Payer: Medicare Other | Admitting: Dermatology

## 2022-03-12 NOTE — Progress Notes (Signed)
Name: Jimmy Green   MRN: [id number]    DOB: 07/22/1951   Date:03/13/2022       Progress Note  Subjective  Chief Complaint  Follow Up  HPI  HTN: bp is at goal on Benicar HCTZ 40/12.5 mg He also takes metoprolol and Norvasc He no longer has chest pain   DM: he has not been checking his glucose lately, A1C back in August  was 8.6 % down  to  7.1 % last visit it was 6.7 % and today if up to 6.9 %  , he is on Farxiga and Metformin since he could not tolerate Rybelsus. He has associated obesity, dyslipidemia, HTN. He is on statin therapy and Benicar Reminded him to have yearly eye exam   Depression Major Recurrent: symptoms started when he lost his job in 2016, he is back on Wellbutrin 450  mg and Lexapro 20 mg daily.He was having insomnia and we added seroquel but it kept him up, he has been anpping during the day for one hour and sleeping better at night   CAD with Angina: medically managed, on aspirin beta blocker and statin therapy , no pain at this time , denies SOB. He is under the care of cardiologist. Denies chest pain, palpitation he has stable decrease in exercise tolerance - moderate activity   History of left internal jugular vein thrombosis: he was seen by vascular surgeon and took aspirin and Eliquis for 3 months, repeat US was clear and he has been off blood thinners since Nov 2022. 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 Unchanged    Headaches with migraine features: usually above right eye, throbbing like and aching, he was doing well on Aimovig, but lately having more episodes again , sometimes 15 per month   Obesity: doing better, BMI is now below 35  . He is eating only once a day and smaller portions. He states he gained weight due to being less active   Patient Active Problem List   Diagnosis Date Noted   History of thrombosis of internal jugular vein 03/13/2022   Recurrent major depression in remission ([place]) 12/10/2021   Squamous cell carcinoma in situ 12/10/2021    History of DVT in adulthood 03/03/2021   Personal history of colonic polyps    Polyp of transverse colon    BPH with obstruction/lower urinary tract symptoms 05/20/2018   History of basal cell carcinoma 03/23/2018   Primary localized osteoarthritis of left hip 04/06/2017   Dyslipidemia associated with type 2 diabetes mellitus ([place]) 10/04/2016   Trigger thumb of both thumbs 06/02/2016   Chronic tension-type headache, intractable 09/25/2015   Benign paroxysmal positional vertigo due to bilateral vestibular disorder 09/25/2015   Hearing loss sensory, bilateral 08/26/2015   Cerebral microvascular disease 08/26/2015   Hypertension, benign 03/28/2015   Gastroesophageal reflux disease without esophagitis 03/28/2015   Hyperlipidemia 03/28/2015   CAD in native artery 03/28/2015   Actinic keratosis 02/14/2015   Obstructive sleep apnea 02/03/2015   Diverticulosis of colon without hemorrhage 01/31/2015   Hx of colonic polyps    Benign neoplasm of descending colon    Benign neoplasm of sigmoid colon    Sebaceous cyst 09/14/2014    Past Surgical History:  Procedure Laterality Date   BACK SURGERY     BASAL CELL CARCINOMA EXCISION Left 12/02/2015   Chest-Done by Dermatologist    BASAL CELL CARCINOMA EXCISION Left 02/21/2018   Nodulocytic pattern, deep margin involved - [facility] - Dr. Brendolyn Patty  BASAL CELL CARCINOMA EXCISION  02/23/2018   CARDIAC CATHETERIZATION Left 04/01/2016   Procedure: Left Heart Cath and Coronary Angiography;  Surgeon: Teodoro Spray, MD;  Location: [facility];  Service: Cardiovascular;  Laterality: Left;   CARDIAC CATHETERIZATION N/A 04/01/2016   Procedure: Intravascular Pressure Wire/FFR Study;  Surgeon: Isaias Cowman, MD;  Location: [facility];  Service: Cardiovascular;  Laterality: N/A;   COLONOSCOPY N/A 01/15/2015   Wohl-ileitis, 2 benign polyps, cryptitis, sigmoid diverticulosis, focal ulceration ICV   COLONOSCOPY WITH  PROPOFOL N/A 08/29/2020   Procedure: COLONOSCOPY WITH PROPOFOL;  Surgeon: Lucilla Lame, MD;  Location: [facility];  Service: Endoscopy;  Laterality: N/A;   CORONARY STENT PLACEMENT     FRACTIONAL FLOW RESERVE WIRE  10/08/2011   Procedure: FRACTIONAL FLOW RESERVE WIRE;  Surgeon: Clent Demark, MD;  Location: [facility];  Service: Cardiovascular;;   HERNIA REPAIR     INTRAVASCULAR PRESSURE WIRE/FFR STUDY N/A 09/09/2021   Procedure: INTRAVASCULAR PRESSURE WIRE/FFR STUDY;  Surgeon: Andrez Grime, MD;  Location: [facility];  Service: Cardiovascular;  Laterality: N/A;   KNEE SURGERY     LEFT HEART CATH AND CORONARY ANGIOGRAPHY N/A 09/09/2021   Procedure: LEFT HEART CATH AND CORONARY ANGIOGRAPHY;  Surgeon: Andrez Grime, MD;  Location: [facility];  Service: Cardiovascular;  Laterality: N/A;   LEFT HEART CATHETERIZATION WITH CORONARY ANGIOGRAM N/A 10/08/2011   Procedure: LEFT HEART CATHETERIZATION WITH CORONARY ANGIOGRAM;  Surgeon: Clent Demark, MD;  Location: [facility];  Service: Cardiovascular;  Laterality: N/A;   parodectomy Left 05/07/2020   SKIN CANCER EXCISION  01/2018   [facility]   TOTAL HIP ARTHROPLASTY Left 04/06/2017   Procedure: TOTAL HIP ARTHROPLASTY ANTERIOR APPROACH;  Surgeon: Hessie Knows, MD;  Location: [facility];  Service: Orthopedics;  Laterality: Left;    Family History  Problem Relation Age of Onset   Lung cancer Father    Heart disease Father    Skin cancer Father     Social History   Tobacco Use   Smoking status: Former    Years: 40.00    Types: Cigarettes    Quit date: 08/03/2012    Years since quitting: 9.6   Smokeless tobacco: Never  Substance Use Topics   Alcohol use: No    Alcohol/week: 0.0 standard drinks of alcohol     Current Outpatient Medications:    amLODipine (NORVASC) 2.5 MG tablet, Take 1 tablet (2.5 mg total) by mouth daily., Disp: 90 tablet, Rfl: 1   aspirin EC 81 MG tablet, Take 81 mg by mouth every  morning. , Disp: , Rfl:    buPROPion (WELLBUTRIN XL) 150 MG 24 hr tablet, Take 1 tablet (150 mg total) by mouth See admin instructions. Take with 300 mg for a total of 450 mg daily, Disp: 90 tablet, Rfl: 1   buPROPion (WELLBUTRIN XL) 300 MG 24 hr tablet, Take 1 tablet (300 mg total) by mouth See admin instructions. Take with 150 mg for a total of 450 mg daily, Disp: 90 tablet, Rfl: 1   dapagliflozin propanediol (FARXIGA) 10 MG TABS tablet, Take 1 tablet (10 mg total) by mouth daily before breakfast., Disp: 90 tablet, Rfl: 1   diclofenac Sodium (VOLTAREN) 1 % GEL, , Disp: , Rfl:    Erenumab-aooe (AIMOVIG) 140 MG/ML SOAJ, Inject 140 mg into the skin every 30 (thirty) days., Disp: 1.12 mL, Rfl: 5   escitalopram (LEXAPRO) 20 MG tablet, Take 1 tablet (20 mg total)  by mouth daily., Disp: 90 tablet, Rfl: 1   gabapentin (NEURONTIN) 300 MG capsule, Take 300 mg by mouth 5 (five) times daily., Disp: , Rfl:    metFORMIN (GLUCOPHAGE-XR) 750 MG 24 hr tablet, Take 2 tablets (1,500 mg total) by mouth daily with breakfast., Disp: 180 tablet, Rfl: 1   metoprolol succinate (TOPROL-XL) 50 MG 24 hr tablet, Take 1 tablet (50 mg total) by mouth every evening. Take with or immediately following a meal., Disp: 90 tablet, Rfl: 1   NARCAN 4 MG/0.1ML LIQD nasal spray kit, 0.4 mg once., Disp: , Rfl: 0   nitroGLYCERIN (NITROSTAT) 0.4 MG SL tablet, DISSOLVE 1 TABLET UNDER THE TONGUE EVERY 5 MINUTES FOR UP TO 3 DOSES AS NEEDED FOR CHEST PAIN. IF NO RELIEF AFTER 3 DOSES, CALL 911 OR GO TO ER., Disp: 25 tablet, Rfl: 0   olmesartan-hydrochlorothiazide (BENICAR HCT) 40-12.5 MG tablet, Take 1 tablet by mouth daily., Disp: 90 tablet, Rfl: 1   omeprazole (PRILOSEC) 40 MG capsule, Take 1 capsule (40 mg total) by mouth daily., Disp: 90 capsule, Rfl: 1   oxyCODONE (ROXICODONE) 15 MG immediate release tablet, Take 7.5-15 mg by mouth 5 (five) times daily as needed for pain., Disp: , Rfl:    rosuvastatin (CRESTOR) 40 MG tablet, Take 1 tablet (40  mg total) by mouth daily., Disp: 90 tablet, Rfl: 3   fluorouracil (EFUDEX) 5 % cream, Apply topically 2 (two) times daily. Apply to area for 14 days to left lower lip (Patient not taking: Reported on 03/13/2022), Disp: 15 g, Rfl: 1   ketoconazole (NIZORAL) 2 % cream, Apply to rash behind ears 1-2 times a day as directed. (Patient not taking: Reported on 03/13/2022), Disp: 30 g, Rfl: 2   lidocaine (LIDODERM) 5 %, Place 1-2 patches onto the skin every 12 (twelve) hours. If tolerated (Patient not taking: Reported on 03/13/2022), Disp: , Rfl:    metroNIDAZOLE (METROGEL) 0.75 % gel, Apply to face 1-2 times a day for roscea. (Patient not taking: Reported on 03/13/2022), Disp: 45 g, Rfl: 5  Allergies  Allergen Reactions   Shingrix [Zoster Vac Recomb Adjuvanted]     Rash    Elavil [Amitriptyline Hcl] Rash   Tape Rash    Paper Tape    I personally reviewed active problem list, medication list, allergies, family history, social history, health maintenance with the patient/caregiver today.   ROS  Constitutional: Negative for fever or weight change.  Respiratory: Negative for cough and shortness of breath.   Cardiovascular: Negative for chest pain or palpitations.  Gastrointestinal: Negative for abdominal pain, no bowel changes.  Musculoskeletal: Negative for gait problem or joint swelling.  Skin: Negative for rash.  Neurological: Negative for dizziness , positive for  headache.  No other specific complaints in a complete review of systems (except as listed in HPI above).   Objective  Vitals:   03/13/22 0754  BP: 136/70  Pulse: 76  Resp: 16  SpO2: 94%  Weight: 227 lb (103 kg)  Height: '5\' 9"'  (1.753 m)    Body mass index is 33.52 kg/m.  Physical Exam  Constitutional: Patient appears well-developed and well-nourished. Obese  No distress.  HEENT: head atraumatic, normocephalic, pupils equal and reactive to light, neck supple Cardiovascular: Normal rate, regular rhythm and normal heart  sounds.  No murmur heard. No BLE edema. Pulmonary/Chest: Effort normal and breath sounds normal. No respiratory distress. Abdominal: Soft.  There is no tenderness. Diathesis recti  Psychiatric: Patient has a normal mood and affect. behavior  is normal. Judgment and thought content normal.   Recent Results (from the past 2160 hour(s))  POCT HgB A1C     Status: Abnormal   Collection Time: 03/13/22  7:56 AM  Result Value Ref Range   Hemoglobin A1C 6.9 (A) 4.0 - 5.6 %   HbA1c POC (<> result, manual entry)     HbA1c, POC (prediabetic range)     HbA1c, POC (controlled diabetic range)       PHQ2/9:    03/13/2022    7:56 AM 12/10/2021    8:39 AM 12/02/2021    1:45 PM 08/12/2021    7:42 AM 04/16/2021    1:36 PM  Depression screen PHQ 2/9  Decreased Interest 0 0 0 0 0  Down, Depressed, Hopeless 0 0 0 0 0  PHQ - 2 Score 0 0 0 0 0  Altered sleeping 0 3  0 0  Tired, decreased energy 0 0  0 0  Change in appetite 0 0  0 0  Feeling bad or failure about yourself  0 0  0 0  Trouble concentrating 0 0  0 0  Moving slowly or fidgety/restless 0 0  0 0  Suicidal thoughts 0 0  0 0  PHQ-9 Score 0 3  0 0  Difficult doing work/chores    Not difficult at all     phq 9 is negative   Fall Risk:    03/13/2022    7:56 AM 12/10/2021    8:39 AM 12/02/2021    1:47 PM 08/12/2021    7:41 AM 04/16/2021    1:36 PM  Fall Risk   Falls in the past year? 0 0 0 0 0  Number falls in past yr: 0 0 0 0 0  Injury with Fall? 0 0 0 0 0  Risk for fall due to : No Fall Risks No Fall Risks No Fall Risks  No Fall Risks  Follow up Falls prevention discussed Falls prevention discussed Falls prevention discussed Falls evaluation completed Falls prevention discussed      Functional Status Survey: Is the patient deaf or have difficulty hearing?: No Does the patient have difficulty seeing, even when wearing glasses/contacts?: No Does the patient have difficulty concentrating, remembering, or making decisions?: No Does the  patient have difficulty walking or climbing stairs?: No Does the patient have difficulty dressing or bathing?: No Does the patient have difficulty doing errands alone such as visiting a doctor's office or shopping?: No    Assessment & Plan  1. Dyslipidemia associated with type 2 diabetes mellitus ([facility])  - POCT HgB A1C - Lipid panel - Microalbumin / creatinine urine ratio  2. History of thrombosis of internal jugular vein   3. Recurrent major depression in remission ([place])  Stable  4. CAD in native artery   5. Chronic tension-type headache, intractable  - Ambulatory referral to Neurology  6. B12 deficiency  - Vitamin B12  7. Migraine without aura and without status migrainosus, not intractable  - Ambulatory referral to Neurology  8. Hypertension, benign   9. BPH with obstruction/lower urinary tract symptoms  - PSA  10. Encounter for long-term current use of high risk medication  - CBC with Differential/Platelet - COMPLETE METABOLIC PANEL WITH GFR

## 2022-03-13 ENCOUNTER — Encounter: Payer: Self-pay | Admitting: Family Medicine

## 2022-03-13 ENCOUNTER — Ambulatory Visit (INDEPENDENT_AMBULATORY_CARE_PROVIDER_SITE_OTHER): Payer: Medicare Other | Admitting: Family Medicine

## 2022-03-13 VITALS — BP 136/70 | HR 76 | Resp 16 | Ht 69.0 in | Wt 227.0 lb

## 2022-03-13 DIAGNOSIS — E1169 Type 2 diabetes mellitus with other specified complication: Secondary | ICD-10-CM | POA: Diagnosis not present

## 2022-03-13 DIAGNOSIS — G43009 Migraine without aura, not intractable, without status migrainosus: Secondary | ICD-10-CM

## 2022-03-13 DIAGNOSIS — Z79899 Other long term (current) drug therapy: Secondary | ICD-10-CM

## 2022-03-13 DIAGNOSIS — N138 Other obstructive and reflux uropathy: Secondary | ICD-10-CM

## 2022-03-13 DIAGNOSIS — I2510 Atherosclerotic heart disease of native coronary artery without angina pectoris: Secondary | ICD-10-CM | POA: Diagnosis not present

## 2022-03-13 DIAGNOSIS — F3340 Major depressive disorder, recurrent, in remission, unspecified: Secondary | ICD-10-CM | POA: Diagnosis not present

## 2022-03-13 DIAGNOSIS — G44221 Chronic tension-type headache, intractable: Secondary | ICD-10-CM

## 2022-03-13 DIAGNOSIS — Z86718 Personal history of other venous thrombosis and embolism: Secondary | ICD-10-CM | POA: Diagnosis not present

## 2022-03-13 DIAGNOSIS — N401 Enlarged prostate with lower urinary tract symptoms: Secondary | ICD-10-CM

## 2022-03-13 DIAGNOSIS — I10 Essential (primary) hypertension: Secondary | ICD-10-CM

## 2022-03-13 DIAGNOSIS — E785 Hyperlipidemia, unspecified: Secondary | ICD-10-CM

## 2022-03-13 DIAGNOSIS — E538 Deficiency of other specified B group vitamins: Secondary | ICD-10-CM

## 2022-03-13 LAB — POCT GLYCOSYLATED HEMOGLOBIN (HGB A1C): Hemoglobin A1C: 6.9 % — AB (ref 4.0–5.6)

## 2022-03-14 LAB — COMPLETE METABOLIC PANEL WITH GFR
AG Ratio: 1.8 (calc) (ref 1.0–2.5)
ALT: 39 U/L (ref 9–46)
AST: 33 U/L (ref 10–35)
Albumin: 4 g/dL (ref 3.6–5.1)
Alkaline phosphatase (APISO): 76 U/L (ref 35–144)
BUN/Creatinine Ratio: 26 (calc) — ABNORMAL HIGH (ref 6–22)
BUN: 16 mg/dL (ref 7–25)
CO2: 27 mmol/L (ref 20–32)
Calcium: 9.6 mg/dL (ref 8.6–10.3)
Chloride: 102 mmol/L (ref 98–110)
Creat: 0.62 mg/dL — ABNORMAL LOW (ref 0.70–1.28)
Globulin: 2.2 g/dL (calc) (ref 1.9–3.7)
Glucose, Bld: 171 mg/dL — ABNORMAL HIGH (ref 65–99)
Potassium: 4.4 mmol/L (ref 3.5–5.3)
Sodium: 137 mmol/L (ref 135–146)
Total Bilirubin: 1.1 mg/dL (ref 0.2–1.2)
Total Protein: 6.2 g/dL (ref 6.1–8.1)
eGFR: 103 mL/min/{1.73_m2} (ref >=60)

## 2022-03-14 LAB — LIPID PANEL
Cholesterol: 96 mg/dL (ref <200)
HDL: 26 mg/dL — ABNORMAL LOW (ref >=40)
LDL Cholesterol (Calc): 37 mg/dL (calc)
Non-HDL Cholesterol (Calc): 70 mg/dL (calc) (ref <130)
Total CHOL/HDL Ratio: 3.7 (calc) (ref <5.0)
Triglycerides: 284 mg/dL — ABNORMAL HIGH (ref <150)

## 2022-03-14 LAB — CBC WITH DIFFERENTIAL/PLATELET
Absolute Monocytes: 518 cells/uL (ref 200–950)
Basophils Absolute: 73 cells/uL (ref 0–200)
Basophils Relative: 1 %
Eosinophils Absolute: 321 cells/uL (ref 15–500)
Eosinophils Relative: 4.4 %
HCT: 49.1 % (ref 38.5–50.0)
Hemoglobin: 16.5 g/dL (ref 13.2–17.1)
Lymphs Abs: 1380 cells/uL (ref 850–3900)
MCH: 31 pg (ref 27.0–33.0)
MCHC: 33.6 g/dL (ref 32.0–36.0)
MCV: 92.3 fL (ref 80.0–100.0)
MPV: 11 fL (ref 7.5–12.5)
Monocytes Relative: 7.1 %
Neutro Abs: 5008 cells/uL (ref 1500–7800)
Neutrophils Relative %: 68.6 %
Platelets: 270 10*3/uL (ref 140–400)
RBC: 5.32 10*6/uL (ref 4.20–5.80)
RDW: 13.1 % (ref 11.0–15.0)
Total Lymphocyte: 18.9 %
WBC: 7.3 10*3/uL (ref 3.8–10.8)

## 2022-03-14 LAB — PSA: PSA: 1.59 ng/mL (ref <=4.00)

## 2022-03-14 LAB — MICROALBUMIN / CREATININE URINE RATIO
Creatinine, Urine: 111 mg/dL (ref 20–320)
Microalb Creat Ratio: 18 mcg/mg creat (ref <30)
Microalb, Ur: 2 mg/dL

## 2022-03-14 LAB — VITAMIN B12: Vitamin B-12: 397 pg/mL (ref 200–1100)

## 2022-04-14 NOTE — Progress Notes (Deleted)
Name: Jimmy Green   MRN: [id number]    DOB: [birth year]   Date:04/14/2022       Progress Note  Subjective  Chief Complaint  Annual Exam  HPI  Patient presents for annual CPE.  IPSS Questionnaire (AUA-7): Over the past month.   1)  How often have you had a sensation of not emptying your bladder completely after you finish urinating?  {Rating:19227}  2)  How often have you had to urinate again less than two hours after you finished urinating? {Rating:19227}  3)  How often have you found you stopped and started again several times when you urinated?  {Rating:19227}  4) How difficult have you found it to postpone urination?  {Rating:19227}  5) How often have you had a weak urinary stream?  {Rating:19227}  6) How often have you had to push or strain to begin urination?  {Rating:19227}  7) How many times did you most typically get up to urinate from the time you went to bed until the time you got up in the morning?  {Rating:19228}  Total score:  0-7 mildly symptomatic   8-19 moderately symptomatic   20-35 severely symptomatic     Diet: *** Exercise: *** Last Dental Exam: **** Last Eye Exam: ***  Depression: phq 9 is {gen pos neg:[id number]}    03/13/2022    7:56 AM 12/10/2021    8:39 AM 12/02/2021    1:45 PM 08/12/2021    7:42 AM 04/16/2021    1:36 PM  Depression screen PHQ 2/9  Decreased Interest 0 0 0 0 0  Down, Depressed, Hopeless 0 0 0 0 0  PHQ - 2 Score 0 0 0 0 0  Altered sleeping 0 3  0 0  Tired, decreased energy 0 0  0 0  Change in appetite 0 0  0 0  Feeling bad or failure about yourself  0 0  0 0  Trouble concentrating 0 0  0 0  Moving slowly or fidgety/restless 0 0  0 0  Suicidal thoughts 0 0  0 0  PHQ-9 Score 0 3  0 0  Difficult doing work/chores    Not difficult at all     Hypertension:  BP Readings from Last 3 Encounters:  03/13/22 136/70  12/10/21 122/62  09/09/21 118/82    Obesity: Wt Readings from Last 3 Encounters:  03/13/22 227 lb (103 kg)  12/10/21  222 lb (100.7 kg)  09/09/21 222 lb (100.7 kg)   BMI Readings from Last 3 Encounters:  03/13/22 33.52 kg/m  12/10/21 34.77 kg/m  09/09/21 34.77 kg/m     Lipids:  Lab Results  Component Value Date   CHOL 96 03/13/2022   CHOL 111 03/31/2021   CHOL 112 03/14/2020   Lab Results  Component Value Date   HDL 26 (L) 03/13/2022   HDL 24 (L) 03/31/2021   HDL 28 (L) 03/14/2020   Lab Results  Component Value Date   LDLCALC 37 03/13/2022   LDLCALC 58 03/31/2021   LDLCALC 60 03/14/2020   Lab Results  Component Value Date   TRIG 284 (H) 03/13/2022   TRIG 218 (H) 03/31/2021   TRIG 154 (H) 03/14/2020   Lab Results  Component Value Date   CHOLHDL 3.7 03/13/2022   CHOLHDL 4.6 03/31/2021   CHOLHDL 4.0 03/14/2020   No results found for: "LDLDIRECT" Glucose:  Glucose, Bld  Date Value Ref Range Status  03/13/2022 171 (H) 65 - 99 mg/dL Final    Comment:    .  Fasting reference interval . For someone without known diabetes, a glucose value >125 mg/dL indicates that they may have diabetes and this should be confirmed with a follow-up test. .   11/13/2020 216 (H) 65 - 99 mg/dL Final    Comment:    .            Fasting reference interval . For someone without known diabetes, a glucose value >125 mg/dL indicates that they may have diabetes and this should be confirmed with a follow-up test. .   03/14/2020 150 (H) 65 - 99 mg/dL Final    Comment:    .            Fasting reference interval . For someone without known diabetes, a glucose value >125 mg/dL indicates that they may have diabetes and this should be confirmed with a follow-up test. .    Glucose-Capillary  Date Value Ref Range Status  09/09/2021 171 (H) 70 - 99 mg/dL Final    Comment:    Glucose reference range applies only to samples taken after fasting for at least 8 hours.  04/09/2017 111 (H) 65 - 99 mg/dL Final  04/09/2017 105 (H) 65 - 99 mg/dL Final    Flowsheet Row Clinical Support  from 12/02/2021 in [facility]  AUDIT-C Score 0       Married STD testing and prevention (HIV/chl/gon/syphilis): N/A Sexual history:  Hep C Screening: 06/07/15 Skin cancer: Discussed monitoring for atypical lesions Colorectal cancer: 08/29/20 Prostate cancer:   Lab Results  Component Value Date   PSA 1.59 03/13/2022   PSA 1.48 11/13/2020   PSA 1.7 07/14/2019     Lung cancer:  Low Dose CT Chest recommended if Age 58-80 years, 30 pack-year currently smoking OR have quit w/in 15years. Patient  no a candidate for screening   AAA: The USPSTF recommends one-time screening with ultrasonography in men ages 86 to 55 years who have ever smoked. Patient   no, a candidate for screening  ECG:  09/09/21  Vaccines:   HPV: N/A Tdap: up to date Shingrix: 1 of 2, declined second Pneumonia: up to date Flu: due COVID-19: up to date  Advanced Care Planning: A voluntary discussion about advance care planning including the explanation and discussion of advance directives.  Discussed health care proxy and Living will, and the patient was able to identify a health care proxy as ***.  Patient does not have a living will and power of attorney of health care   Patient Active Problem List   Diagnosis Date Noted   History of thrombosis of internal jugular vein 03/13/2022   Recurrent major depression in remission ([place]) 12/10/2021   Squamous cell carcinoma in situ 12/10/2021   History of DVT in adulthood 03/03/2021   Personal history of colonic polyps    Polyp of transverse colon    BPH with obstruction/lower urinary tract symptoms 05/20/2018   History of basal cell carcinoma 03/23/2018   Primary localized osteoarthritis of left hip 04/06/2017   Dyslipidemia associated with type 2 diabetes mellitus ([place]) 10/04/2016   Trigger thumb of both thumbs 06/02/2016   Chronic tension-type headache, intractable 09/25/2015   Benign paroxysmal positional vertigo due to bilateral vestibular  disorder 09/25/2015   Hearing loss sensory, bilateral 08/26/2015   Cerebral microvascular disease 08/26/2015   Hypertension, benign 03/28/2015   Gastroesophageal reflux disease without esophagitis 03/28/2015   Hyperlipidemia 03/28/2015   CAD in native artery 03/28/2015   Actinic keratosis 02/14/2015   Obstructive sleep apnea  02/03/2015   Diverticulosis of colon without hemorrhage 01/31/2015   Hx of colonic polyps    Benign neoplasm of descending colon    Benign neoplasm of sigmoid colon    Sebaceous cyst 09/14/2014    Past Surgical History:  Procedure Laterality Date   BACK SURGERY     BASAL CELL CARCINOMA EXCISION Left 12/02/2015   Chest-Done by Dermatologist    BASAL CELL CARCINOMA EXCISION Left 02/21/2018   Nodulocytic pattern, deep margin involved - [facility] - Dr. Brendolyn Patty   BASAL CELL CARCINOMA EXCISION  02/23/2018   CARDIAC CATHETERIZATION Left 04/01/2016   Procedure: Left Heart Cath and Coronary Angiography;  Surgeon: Teodoro Spray, MD;  Location: [place] [facility];  Service: Cardiovascular;  Laterality: Left;   CARDIAC CATHETERIZATION N/A 04/01/2016   Procedure: Intravascular Pressure Wire/FFR Study;  Surgeon: Isaias Cowman, MD;  Location: [place] [facility];  Service: Cardiovascular;  Laterality: N/A;   COLONOSCOPY N/A 01/15/2015   Wohl-ileitis, 2 benign polyps, cryptitis, sigmoid diverticulosis, focal ulceration ICV   COLONOSCOPY WITH PROPOFOL N/A 08/29/2020   Procedure: COLONOSCOPY WITH PROPOFOL;  Surgeon: Lucilla Lame, MD;  Location: [facility] ENDOSCOPY;  Service: Endoscopy;  Laterality: N/A;   CORONARY STENT PLACEMENT     FRACTIONAL FLOW RESERVE WIRE  10/08/2011   Procedure: FRACTIONAL FLOW RESERVE WIRE;  Surgeon: Clent Demark, MD;  Location: [place] CATH LAB;  Service: Cardiovascular;;   HERNIA REPAIR     INTRAVASCULAR PRESSURE WIRE/FFR STUDY N/A 09/09/2021   Procedure: INTRAVASCULAR PRESSURE WIRE/FFR STUDY;  Surgeon: Andrez Grime, MD;  Location:  [place] [facility];  Service: Cardiovascular;  Laterality: N/A;   KNEE SURGERY     LEFT HEART CATH AND CORONARY ANGIOGRAPHY N/A 09/09/2021   Procedure: LEFT HEART CATH AND CORONARY ANGIOGRAPHY;  Surgeon: Andrez Grime, MD;  Location: [place] [facility];  Service: Cardiovascular;  Laterality: N/A;   LEFT HEART CATHETERIZATION WITH CORONARY ANGIOGRAM N/A 10/08/2011   Procedure: LEFT HEART CATHETERIZATION WITH CORONARY ANGIOGRAM;  Surgeon: Clent Demark, MD;  Location: [place] CATH LAB;  Service: Cardiovascular;  Laterality: N/A;   parodectomy Left 05/07/2020   SKIN CANCER EXCISION  01/2018   [facility]   TOTAL HIP ARTHROPLASTY Left 04/06/2017   Procedure: TOTAL HIP ARTHROPLASTY ANTERIOR APPROACH;  Surgeon: Hessie Knows, MD;  Location: [facility] ORS;  Service: Orthopedics;  Laterality: Left;    Family History  Problem Relation Age of Onset   Lung cancer Father    Heart disease Father    Skin cancer Father     Social History   Socioeconomic History   Marital status: Married    Spouse name: Thayer Headings   Number of children: 1   Years of education: Not on file   Highest education level: High school graduate  Occupational History   Occupation: retired  Tobacco Use   Smoking status: Former    Years: 40.00    Types: Cigarettes    Quit date: 08/03/2012    Years since quitting: 9.7   Smokeless tobacco: Never  Vaping Use   Vaping Use: Never used  Substance and Sexual Activity   Alcohol use: No    Alcohol/week: 0.0 standard drinks of alcohol   Drug use: No   Sexual activity: Yes    Partners: Female    Birth control/protection: None  Other Topics Concern   Not on file  Social History Narrative   Wife has 2 children by another marriage and he has 1 child by a previous  marriage.   Social Determinants of Health   Financial Resource Strain: Low Risk  (12/02/2021)   Overall Financial Resource Strain (CARDIA)    Difficulty of Paying Living Expenses: Not hard at all  Food Insecurity:  No Food Insecurity (12/02/2021)   Hunger Vital Sign    Worried About Running Out of Food in the Last Year: Never true    Ran Out of Food in the Last Year: Never true  Transportation Needs: No Transportation Needs (12/02/2021)   PRAPARE - Hydrologist (Medical): No    Lack of Transportation (Non-Medical): No  Physical Activity: Inactive (12/02/2021)   Exercise Vital Sign    Days of Exercise per Week: 0 days    Minutes of Exercise per Session: 0 min  Stress: No Stress Concern Present (12/02/2021)   [place]    Feeling of Stress : Not at all  Social Connections: Moderately Isolated (12/02/2021)   Social Connection and Isolation Panel [NHANES]    Frequency of Communication with Friends and Family: More than three times a week    Frequency of Social Gatherings with Friends and Family: Once a week    Attends Religious Services: Never    Marine scientist or Organizations: No    Attends Archivist Meetings: Never    Marital Status: Married  Human resources officer Violence: Not At Risk (12/02/2021)   Humiliation, Afraid, Rape, and Kick questionnaire    Fear of Current or Ex-Partner: No    Emotionally Abused: No    Physically Abused: No    Sexually Abused: No     Current Outpatient Medications:    amLODipine (NORVASC) 2.5 MG tablet, Take 1 tablet (2.5 mg total) by mouth daily., Disp: 90 tablet, Rfl: 1   aspirin EC 81 MG tablet, Take 81 mg by mouth every morning. , Disp: , Rfl:    buPROPion (WELLBUTRIN XL) 150 MG 24 hr tablet, Take 1 tablet (150 mg total) by mouth See admin instructions. Take with 300 mg for a total of 450 mg daily, Disp: 90 tablet, Rfl: 1   buPROPion (WELLBUTRIN XL) 300 MG 24 hr tablet, Take 1 tablet (300 mg total) by mouth See admin instructions. Take with 150 mg for a total of 450 mg daily, Disp: 90 tablet, Rfl: 1   dapagliflozin propanediol (FARXIGA) 10 MG TABS tablet, Take 1  tablet (10 mg total) by mouth daily before breakfast., Disp: 90 tablet, Rfl: 1   diclofenac Sodium (VOLTAREN) 1 % GEL, , Disp: , Rfl:    Erenumab-aooe (AIMOVIG) 140 MG/ML SOAJ, Inject 140 mg into the skin every 30 (thirty) days., Disp: 1.12 mL, Rfl: 5   escitalopram (LEXAPRO) 20 MG tablet, Take 1 tablet (20 mg total) by mouth daily., Disp: 90 tablet, Rfl: 1   fluorouracil (EFUDEX) 5 % cream, Apply topically 2 (two) times daily. Apply to area for 14 days to left lower lip (Patient not taking: Reported on 03/13/2022), Disp: 15 g, Rfl: 1   gabapentin (NEURONTIN) 300 MG capsule, Take 300 mg by mouth 5 (five) times daily., Disp: , Rfl:    ketoconazole (NIZORAL) 2 % cream, Apply to rash behind ears 1-2 times a day as directed. (Patient not taking: Reported on 03/13/2022), Disp: 30 g, Rfl: 2   lidocaine (LIDODERM) 5 %, Place 1-2 patches onto the skin every 12 (twelve) hours. If tolerated (Patient not taking: Reported on 03/13/2022), Disp: , Rfl:    metFORMIN (GLUCOPHAGE-XR)  750 MG 24 hr tablet, Take 2 tablets (1,500 mg total) by mouth daily with breakfast., Disp: 180 tablet, Rfl: 1   metoprolol succinate (TOPROL-XL) 50 MG 24 hr tablet, Take 1 tablet (50 mg total) by mouth every evening. Take with or immediately following a meal., Disp: 90 tablet, Rfl: 1   metroNIDAZOLE (METROGEL) 0.75 % gel, Apply to face 1-2 times a day for roscea. (Patient not taking: Reported on 03/13/2022), Disp: 45 g, Rfl: 5   NARCAN 4 MG/0.1ML LIQD nasal spray kit, 0.4 mg once., Disp: , Rfl: 0   nitroGLYCERIN (NITROSTAT) 0.4 MG SL tablet, DISSOLVE 1 TABLET UNDER THE TONGUE EVERY 5 MINUTES FOR UP TO 3 DOSES AS NEEDED FOR CHEST PAIN. IF NO RELIEF AFTER 3 DOSES, CALL 911 OR GO TO ER., Disp: 25 tablet, Rfl: 0   olmesartan-hydrochlorothiazide (BENICAR HCT) 40-12.5 MG tablet, Take 1 tablet by mouth daily., Disp: 90 tablet, Rfl: 1   omeprazole (PRILOSEC) 40 MG capsule, Take 1 capsule (40 mg total) by mouth daily., Disp: 90 capsule, Rfl: 1    oxyCODONE (ROXICODONE) 15 MG immediate release tablet, Take 7.5-15 mg by mouth 5 (five) times daily as needed for pain., Disp: , Rfl:    rosuvastatin (CRESTOR) 40 MG tablet, Take 1 tablet (40 mg total) by mouth daily., Disp: 90 tablet, Rfl: 3  Allergies  Allergen Reactions   Shingrix [Zoster Vac Recomb Adjuvanted]     Rash    Elavil [Amitriptyline Hcl] Rash   Tape Rash    Paper Tape     ROS  ***   Objective  There were no vitals filed for this visit.  There is no height or weight on file to calculate BMI.  Physical Exam ***  Recent Results (from the past 2160 hour(s))  POCT HgB A1C     Status: Abnormal   Collection Time: 03/13/22  7:56 AM  Result Value Ref Range   Hemoglobin A1C 6.9 (A) 4.0 - 5.6 %   HbA1c POC (<> result, manual entry)     HbA1c, POC (prediabetic range)     HbA1c, POC (controlled diabetic range)    Lipid panel     Status: Abnormal   Collection Time: 03/13/22  8:47 AM  Result Value Ref Range   Cholesterol 96 <200 mg/dL   HDL 26 (L) > OR = 40 mg/dL   Triglycerides 284 (H) <150 mg/dL    Comment: . If a non-fasting specimen was collected, consider repeat triglyceride testing on a fasting specimen if clinically indicated.  Yates Decamp et al. J. of Clin. Lipidol. 8127;5:170-017. [name]    LDL Cholesterol (Calc) 37 mg/dL (calc)    Comment: Reference range: <100 . Desirable range <100 mg/dL for primary prevention;   <70 mg/dL for patients with CHD or diabetic patients  with > or = 2 CHD risk factors. [name] LDL-C is now calculated using the Martin-Hopkins  calculation, which is a validated novel method providing  better accuracy than the Friedewald equation in the  estimation of LDL-C.  Cresenciano Genre et al. Annamaria Helling. 4944;967(59): 2061-2068  (http://education.[facility].com/faq/FAQ164)    Total CHOL/HDL Ratio 3.7 <5.0 (calc)   Non-HDL Cholesterol (Calc) 70 <130 mg/dL (calc)    Comment: For patients with diabetes plus 1 major ASCVD risk  factor, treating to a  non-HDL-C goal of <100 mg/dL  (LDL-C of <70 mg/dL) is considered a therapeutic  option.   Microalbumin / creatinine urine ratio     Status: None   Collection Time: 03/13/22  8:47 AM  Result Value Ref Range   Creatinine, Urine 111 20 - 320 mg/dL   Microalb, Ur 2.0 mg/dL    Comment: Reference Range Not established    Microalb Creat Ratio 18 <30 mcg/mg creat    Comment: . The ADA defines abnormalities in albumin excretion as follows: [name] Albuminuria Category        Result (mcg/mg creatinine) . Normal to Mildly increased   <30 Moderately increased         30-299  Severely increased           > OR = 300 . The ADA recommends that at least two of three specimens collected within a 3-6 month period be abnormal before considering a patient to be within a diagnostic category.   CBC with Differential/Platelet     Status: None   Collection Time: 03/13/22  8:47 AM  Result Value Ref Range   WBC 7.3 3.8 - 10.8 Thousand/uL   RBC 5.32 4.20 - 5.80 Million/uL   Hemoglobin 16.5 13.2 - 17.1 g/dL   HCT 49.1 38.5 - 50.0 %   MCV 92.3 80.0 - 100.0 fL   MCH 31.0 27.0 - 33.0 pg   MCHC 33.6 32.0 - 36.0 g/dL   RDW 13.1 11.0 - 15.0 %   Platelets 270 140 - 400 Thousand/uL   MPV 11.0 7.5 - 12.5 fL   Neutro Abs 5,008 1,500 - 7,800 cells/uL   Lymphs Abs 1,380 850 - 3,900 cells/uL   Absolute Monocytes 518 200 - 950 cells/uL   Eosinophils Absolute 321 15 - 500 cells/uL   Basophils Absolute 73 0 - 200 cells/uL   Neutrophils Relative % 68.6 %   Total Lymphocyte 18.9 %   Monocytes Relative 7.1 %   Eosinophils Relative 4.4 %   Basophils Relative 1.0 %  COMPLETE METABOLIC PANEL WITH GFR     Status: Abnormal   Collection Time: 03/13/22  8:47 AM  Result Value Ref Range   Glucose, Bld 171 (H) 65 - 99 mg/dL    Comment: .            Fasting reference interval . For someone without known diabetes, a glucose value >125 mg/dL indicates that they may have diabetes and this should be confirmed with  a follow-up test. .    BUN 16 7 - 25 mg/dL   Creat 0.62 (L) 0.70 - 1.28 mg/dL   eGFR 103 > OR = 60 mL/min/1.38m   BUN/Creatinine Ratio 26 (H) 6 - 22 (calc)   Sodium 137 135 - 146 mmol/L   Potassium 4.4 3.5 - 5.3 mmol/L   Chloride 102 98 - 110 mmol/L   CO2 27 20 - 32 mmol/L   Calcium 9.6 8.6 - 10.3 mg/dL   Total Protein 6.2 6.1 - 8.1 g/dL   Albumin 4.0 3.6 - 5.1 g/dL   Globulin 2.2 1.9 - 3.7 g/dL (calc)   AG Ratio 1.8 1.0 - 2.5 (calc)   Total Bilirubin 1.1 0.2 - 1.2 mg/dL   Alkaline phosphatase (APISO) 76 35 - 144 U/L   AST 33 10 - 35 U/L   ALT 39 9 - 46 U/L  PSA     Status: None   Collection Time: 03/13/22  8:47 AM  Result Value Ref Range   PSA 1.59 < OR = 4.00 ng/mL    Comment: The total PSA value from this assay system is  standardized against the WHO standard. The test  result will be approximately 20% lower when compared  to  the equimolar-standardized total PSA (Beckman  Coulter). Comparison of serial PSA results should be  interpreted with this fact in mind. . This test was performed using the Siemens  chemiluminescent method. Values obtained from  different assay methods cannot be used interchangeably. PSA levels, regardless of value, should not be interpreted as absolute evidence of the presence or absence of disease.   Vitamin B12     Status: None   Collection Time: 03/13/22  8:47 AM  Result Value Ref Range   Vitamin B-12 397 200 - 1,100 pg/mL    Comment: . Please Note: Although the reference range for vitamin B12 is [phone] pg/mL, it has been reported that between 5 and 10% of patients with values between 200 and 400 pg/mL may experience neuropsychiatric and hematologic abnormalities due to occult B12 deficiency; less than 1% of patients with values above 400 pg/mL will have symptoms. .      Fall Risk:    03/13/2022    7:56 AM 12/10/2021    8:39 AM 12/02/2021    1:47 PM 08/12/2021    7:41 AM 04/16/2021    1:36 PM  Fall Risk   Falls in the past  year? 0 0 0 0 0  Number falls in past yr: 0 0 0 0 0  Injury with Fall? 0 0 0 0 0  Risk for fall due to : No Fall Risks No Fall Risks No Fall Risks  No Fall Risks  Follow up Falls prevention discussed Falls prevention discussed Falls prevention discussed Falls evaluation completed Falls prevention discussed     Functional Status Survey:      Assessment & Plan  1. Well adult exam ***    -Prostate cancer screening and PSA options (with potential risks and benefits of testing vs not testing) were discussed along with recent recs/guidelines. -USPSTF grade A and B recommendations reviewed with patient; age-appropriate recommendations, preventive care, screening tests, etc discussed and encouraged; healthy living encouraged; see AVS for patient education given to patient -Discussed importance of 150 minutes of physical activity weekly, eat two servings of fish weekly, eat one serving of tree nuts ( cashews, pistachios, pecans, almonds.[name]) every other day, eat 6 servings of fruit/vegetables daily and drink plenty of water and avoid sweet beverages.  -Reviewed Health Maintenance: yes

## 2022-04-15 ENCOUNTER — Encounter: Payer: Medicare Other | Admitting: Family Medicine

## 2022-04-15 DIAGNOSIS — Z0000 Encounter for general adult medical examination without abnormal findings: Secondary | ICD-10-CM

## 2022-04-27 NOTE — Progress Notes (Unsigned)
Name: Jimmy Green   MRN: [id number]    DOB: [birth year]   Date:04/28/2022       Progress Note  Subjective  Chief Complaint  Back Pain  HPI  Chronic low back pain : he has been a patient of Dr. Primus Bravo for the past 5 years, he taking oxycodone 15 mg 5 times per day. He was given a muscle relaxer in the past. He came in today because he went for a visit two weeks ago and was told that Dr. Primus Bravo was retiring, he was given one month supply of his pain medications however instead of 180 tablets he was given 150 tablets. He states he found another provider/pain clinic but he is not sure of the name. Appointment was scheduled for October 11 th, 2023. Dr. Primus Bravo will see him one more time before he leaves. Pain today is constant 8/10 on left lower back, worse when he first stands up, walking slowly due to pain. The pain has been worse over the past couple of months and was ready to have an ablation but now he will have to wait for new pain clinic to re-evaluate him   He denies bowel or bladder incontinence , no radiculitis   Migraine he said he is doing okay right now with Aimovig monthly, he states episode still about 10 times per month, explained he needs to follow up with neurologist    Patient Active Problem List   Diagnosis Date Noted   History of thrombosis of internal jugular vein 03/13/2022   Recurrent major depression in remission ([facility]) 12/10/2021   Squamous cell carcinoma in situ 12/10/2021   History of DVT in adulthood 03/03/2021   Personal history of colonic polyps    Polyp of transverse colon    BPH with obstruction/lower urinary tract symptoms 05/20/2018   History of basal cell carcinoma 03/23/2018   Primary localized osteoarthritis of left hip 04/06/2017   Dyslipidemia associated with type 2 diabetes mellitus ([place]) 10/04/2016   Trigger thumb of both thumbs 06/02/2016   Chronic tension-type headache, intractable 09/25/2015   Benign paroxysmal positional vertigo due to bilateral  vestibular disorder 09/25/2015   Hearing loss sensory, bilateral 08/26/2015   Cerebral microvascular disease 08/26/2015   Hypertension, benign 03/28/2015   Gastroesophageal reflux disease without esophagitis 03/28/2015   Hyperlipidemia 03/28/2015   CAD in native artery 03/28/2015   Actinic keratosis 02/14/2015   Obstructive sleep apnea 02/03/2015   Diverticulosis of colon without hemorrhage 01/31/2015   Hx of colonic polyps    Benign neoplasm of descending colon    Benign neoplasm of sigmoid colon    Sebaceous cyst 09/14/2014    Past Surgical History:  Procedure Laterality Date   BACK SURGERY     BASAL CELL CARCINOMA EXCISION Left 12/02/2015   Chest-Done by Dermatologist    BASAL CELL CARCINOMA EXCISION Left 02/21/2018   Nodulocytic pattern, deep margin involved - [facility] - Dr. Brendolyn Patty   BASAL CELL CARCINOMA EXCISION  02/23/2018   CARDIAC CATHETERIZATION Left 04/01/2016   Procedure: Left Heart Cath and Coronary Angiography;  Surgeon: Teodoro Spray, MD;  Location: [facility] [facility];  Service: Cardiovascular;  Laterality: Left;   CARDIAC CATHETERIZATION N/A 04/01/2016   Procedure: Intravascular Pressure Wire/FFR Study;  Surgeon: Isaias Cowman, MD;  Location: [place] [facility];  Service: Cardiovascular;  Laterality: N/A;   COLONOSCOPY N/A 01/15/2015   Wohl-ileitis, 2 benign polyps, cryptitis, sigmoid diverticulosis, focal ulceration ICV   COLONOSCOPY WITH PROPOFOL N/A 08/29/2020  Procedure: COLONOSCOPY WITH PROPOFOL;  Surgeon: Lucilla Lame, MD;  Location: [facility] ENDOSCOPY;  Service: Endoscopy;  Laterality: N/A;   CORONARY STENT PLACEMENT     FRACTIONAL FLOW RESERVE WIRE  10/08/2011   Procedure: FRACTIONAL FLOW RESERVE WIRE;  Surgeon: Clent Demark, MD;  Location: [facility];  Service: Cardiovascular;;   HERNIA REPAIR     INTRAVASCULAR PRESSURE WIRE/FFR STUDY N/A 09/09/2021   Procedure: INTRAVASCULAR PRESSURE WIRE/FFR STUDY;  Surgeon: Andrez Grime, MD;   Location: [place];  Service: Cardiovascular;  Laterality: N/A;   KNEE SURGERY     LEFT HEART CATH AND CORONARY ANGIOGRAPHY N/A 09/09/2021   Procedure: LEFT HEART CATH AND CORONARY ANGIOGRAPHY;  Surgeon: Andrez Grime, MD;  Location: [place] [facility];  Service: Cardiovascular;  Laterality: N/A;   LEFT HEART CATHETERIZATION WITH CORONARY ANGIOGRAM N/A 10/08/2011   Procedure: LEFT HEART CATHETERIZATION WITH CORONARY ANGIOGRAM;  Surgeon: Clent Demark, MD;  Location: [facility] CATH [facility];  Service: Cardiovascular;  Laterality: N/A;   parodectomy Left 05/07/2020   SKIN CANCER EXCISION  01/2018   [facility]   TOTAL HIP ARTHROPLASTY Left 04/06/2017   Procedure: TOTAL HIP ARTHROPLASTY ANTERIOR APPROACH;  Surgeon: Hessie Knows, MD;  Location: [facility] OR[facility];  Service: Orthopedics;  Laterality: Left;    Family History  Problem Relation Age of Onset   Lung cancer Father    Heart disease Father    Skin cancer Father     Social History   Tobacco Use   Smoking status: Former    Years: 40.00    Types: Cigarettes    Quit date: 08/03/2012    Years since quitting: 9.7   Smokeless tobacco: Never  Substance Use Topics   Alcohol use: No    Alcohol/week: 0.0 standard drinks of alcohol     Current Outpatient Medications:    amLODipine (NORVASC) 2.5 MG tablet, Take 1 tablet (2.5 mg total) by mouth daily., Disp: 90 tablet, Rfl: 1   aspirin EC 81 MG tablet, Take 81 mg by mouth every morning. , Disp: , Rfl:    buPROPion (WELLBUTRIN XL) 150 MG 24 hr tablet, Take 1 tablet (150 mg total) by mouth See admin instructions. Take with 300 mg for a total of 450 mg daily, Disp: 90 tablet, Rfl: 1   buPROPion (WELLBUTRIN XL) 300 MG 24 hr tablet, Take 1 tablet (300 mg total) by mouth See admin instructions. Take with 150 mg for a total of 450 mg daily, Disp: 90 tablet, Rfl: 1   dapagliflozin propanediol (FARXIGA) 10 MG TABS tablet, Take 1 tablet (10 mg total) by mouth daily before breakfast., Disp: 90  tablet, Rfl: 1   diclofenac Sodium (VOLTAREN) 1 % GEL, , Disp: , Rfl:    Erenumab-aooe (AIMOVIG) 140 MG/ML SOAJ, Inject 140 mg into the skin every 30 (thirty) days., Disp: 1.12 mL, Rfl: 5   escitalopram (LEXAPRO) 20 MG tablet, Take 1 tablet (20 mg total) by mouth daily., Disp: 90 tablet, Rfl: 1   fluorouracil (EFUDEX) 5 % cream, Apply topically 2 (two) times daily. Apply to area for 14 days to left lower lip, Disp: 15 g, Rfl: 1   gabapentin (NEURONTIN) 300 MG capsule, Take 300 mg by mouth 5 (five) times daily., Disp: , Rfl:    ketoconazole (NIZORAL) 2 % cream, Apply to rash behind ears 1-2 times a day as directed., Disp: 30 g, Rfl: 2   lidocaine (LIDODERM) 5 %, Place 1-2 patches onto the skin every 12 (twelve) hours.  If tolerated, Disp: , Rfl:    metFORMIN (GLUCOPHAGE-XR) 750 MG 24 hr tablet, Take 2 tablets (1,500 mg total) by mouth daily with breakfast., Disp: 180 tablet, Rfl: 1   methocarbamol (ROBAXIN) 750 MG tablet, Take 750 mg by mouth., Disp: , Rfl:    metoprolol succinate (TOPROL-XL) 50 MG 24 hr tablet, Take 1 tablet (50 mg total) by mouth every evening. Take with or immediately following a meal., Disp: 90 tablet, Rfl: 1   metroNIDAZOLE (METROGEL) 0.75 % gel, Apply to face 1-2 times a day for roscea., Disp: 45 g, Rfl: 5   NARCAN 4 MG/0.1ML LIQD nasal spray kit, 0.4 mg once., Disp: , Rfl: 0   nitroGLYCERIN (NITROSTAT) 0.4 MG SL tablet, DISSOLVE 1 TABLET UNDER THE TONGUE EVERY 5 MINUTES FOR UP TO 3 DOSES AS NEEDED FOR CHEST PAIN. IF NO RELIEF AFTER 3 DOSES, CALL 911 OR GO TO ER., Disp: 25 tablet, Rfl: 0   olmesartan-hydrochlorothiazide (BENICAR HCT) 40-12.5 MG tablet, Take 1 tablet by mouth daily., Disp: 90 tablet, Rfl: 1   omeprazole (PRILOSEC) 40 MG capsule, Take 1 capsule (40 mg total) by mouth daily., Disp: 90 capsule, Rfl: 1   oxyCODONE (ROXICODONE) 15 MG immediate release tablet, Take 7.5-15 mg by mouth 5 (five) times daily as needed for pain., Disp: , Rfl:    rosuvastatin (CRESTOR) 40 MG  tablet, Take 1 tablet (40 mg total) by mouth daily., Disp: 90 tablet, Rfl: 3  Allergies  Allergen Reactions   Shingrix [Zoster Vac Recomb Adjuvanted]     Rash    Elavil [Amitriptyline Hcl] Rash   Tape Rash    Paper Tape    I personally reviewed active problem list, medication list, allergies, family history, social history, health maintenance with the patient/caregiver today.   ROS  Ten systems reviewed and is negative except as mentioned in HPI   Objective  Vitals:   04/28/22 1430  BP: 120/72  Pulse: 73  Resp: 16  SpO2: 96%  Weight: 223 lb (101.2 kg)  Height: '5\' 9"'  (1.753 m)    Body mass index is 32.93 kg/m.  Physical Exam  Constitutional: Patient appears well-developed and well-nourished. Obese  In mild distress, moving around the room  HEENT: head atraumatic, normocephalic, pupils equal and reactive to light, ears, neck supple, throat within normal limits Cardiovascular: Normal rate, regular rhythm and normal heart sounds.  No murmur heard. No BLE edema. Pulmonary/Chest: Effort normal and breath sounds normal. No respiratory distress. Abdominal: Soft.  There is no tenderness. Muscular Skeletal: pain with change in position on left lower back, negative straight leg raise, pain during palpation of left lower back  Psychiatric: Patient has a normal mood and affect. behavior is normal. Judgment and thought content normal.   Recent Results (from the past 2160 hour(s))  POCT HgB A1C     Status: Abnormal   Collection Time: 03/13/22  7:56 AM  Result Value Ref Range   Hemoglobin A1C 6.9 (A) 4.0 - 5.6 %   HbA1c POC (<> result, manual entry)     HbA1c, POC (prediabetic range)     HbA1c, POC (controlled diabetic range)    Lipid panel     Status: Abnormal   Collection Time: 03/13/22  8:47 AM  Result Value Ref Range   Cholesterol 96 <200 mg/dL   HDL 26 (L) > OR = 40 mg/dL   Triglycerides 284 (H) <150 mg/dL    Comment: . If a non-fasting specimen was collected,  consider repeat triglyceride testing on  a fasting specimen if clinically indicated.  Yates Decamp et al. J. of Clin. Lipidol. 9833;8:250-539. [name]    LDL Cholesterol (Calc) 37 mg/dL (calc)    Comment: Reference range: <100 . Desirable range <100 mg/dL for primary prevention;   <70 mg/dL for patients with CHD or diabetic patients  with > or = 2 CHD risk factors. [name] LDL-C is now calculated using the Martin-Hopkins  calculation, which is a validated novel method providing  better accuracy than the Friedewald equation in the  estimation of LDL-C.  Cresenciano Genre et al. Annamaria Helling. 7673;419(37): 2061-2068  (http://education.[facility].com/faq/FAQ164)    Total CHOL/HDL Ratio 3.7 <5.0 (calc)   Non-HDL Cholesterol (Calc) 70 <130 mg/dL (calc)    Comment: For patients with diabetes plus 1 major ASCVD risk  factor, treating to a non-HDL-C goal of <100 mg/dL  (LDL-C of <70 mg/dL) is considered a therapeutic  option.   Microalbumin / creatinine urine ratio     Status: None   Collection Time: 03/13/22  8:47 AM  Result Value Ref Range   Creatinine, Urine 111 20 - 320 mg/dL   Microalb, Ur 2.0 mg/dL    Comment: Reference Range Not established    Microalb Creat Ratio 18 <30 mcg/mg creat    Comment: . The ADA defines abnormalities in albumin excretion as follows: [name] Albuminuria Category        Result (mcg/mg creatinine) . Normal to Mildly increased   <30 Moderately increased         30-299  Severely increased           > OR = 300 . The ADA recommends that at least two of three specimens collected within a 3-6 month period be abnormal before considering a patient to be within a diagnostic category.   CBC with Differential/Platelet     Status: None   Collection Time: 03/13/22  8:47 AM  Result Value Ref Range   WBC 7.3 3.8 - 10.8 Thousand/uL   RBC 5.32 4.20 - 5.80 Million/uL   Hemoglobin 16.5 13.2 - 17.1 g/dL   HCT 49.1 38.5 - 50.0 %   MCV 92.3 80.0 - 100.0 fL   MCH 31.0 27.0 - 33.0 pg   MCHC  33.6 32.0 - 36.0 g/dL   RDW 13.1 11.0 - 15.0 %   Platelets 270 140 - 400 Thousand/uL   MPV 11.0 7.5 - 12.5 fL   Neutro Abs 5,008 1,500 - 7,800 cells/uL   Lymphs Abs 1,380 850 - 3,900 cells/uL   Absolute Monocytes 518 200 - 950 cells/uL   Eosinophils Absolute 321 15 - 500 cells/uL   Basophils Absolute 73 0 - 200 cells/uL   Neutrophils Relative % 68.6 %   Total Lymphocyte 18.9 %   Monocytes Relative 7.1 %   Eosinophils Relative 4.4 %   Basophils Relative 1.0 %  COMPLETE METABOLIC PANEL WITH GFR     Status: Abnormal   Collection Time: 03/13/22  8:47 AM  Result Value Ref Range   Glucose, Bld 171 (H) 65 - 99 mg/dL    Comment: .            Fasting reference interval . For someone without known diabetes, a glucose value >125 mg/dL indicates that they may have diabetes and this should be confirmed with a follow-up test. .    BUN 16 7 - 25 mg/dL   Creat 0.62 (L) 0.70 - 1.28 mg/dL   eGFR 103 > OR = 60 mL/min/1.30m   BUN/Creatinine Ratio 26 (H) 6 -  22 (calc)   Sodium 137 135 - 146 mmol/L   Potassium 4.4 3.5 - 5.3 mmol/L   Chloride 102 98 - 110 mmol/L   CO2 27 20 - 32 mmol/L   Calcium 9.6 8.6 - 10.3 mg/dL   Total Protein 6.2 6.1 - 8.1 g/dL   Albumin 4.0 3.6 - 5.1 g/dL   Globulin 2.2 1.9 - 3.7 g/dL (calc)   AG Ratio 1.8 1.0 - 2.5 (calc)   Total Bilirubin 1.1 0.2 - 1.2 mg/dL   Alkaline phosphatase (APISO) 76 35 - 144 U/L   AST 33 10 - 35 U/L   ALT 39 9 - 46 U/L  PSA     Status: None   Collection Time: 03/13/22  8:47 AM  Result Value Ref Range   PSA 1.59 < OR = 4.00 ng/mL    Comment: The total PSA value from this assay system is  standardized against the WHO standard. The test  result will be approximately 20% lower when compared  to the equimolar-standardized total PSA (Beckman  Coulter). Comparison of serial PSA results should be  interpreted with this fact in mind. . This test was performed using the Siemens  chemiluminescent method. Values obtained from  different assay  methods cannot be used interchangeably. PSA levels, regardless of value, should not be interpreted as absolute evidence of the presence or absence of disease.   Vitamin B12     Status: None   Collection Time: 03/13/22  8:47 AM  Result Value Ref Range   Vitamin B-12 397 200 - 1,100 pg/mL    Comment: . Please Note: Although the reference range for vitamin B12 is [phone] pg/mL, it has been reported that between 5 and 10% of patients with values between 200 and 400 pg/mL may experience neuropsychiatric and hematologic abnormalities due to occult B12 deficiency; less than 1% of patients with values above 400 pg/mL will have symptoms. .     PHQ2/9:    04/28/2022    2:29 PM 03/13/2022    7:56 AM 12/10/2021    8:39 AM 12/02/2021    1:45 PM 08/12/2021    7:42 AM  Depression screen PHQ 2/9  Decreased Interest 0 0 0 0 0  Down, Depressed, Hopeless 0 0 0 0 0  PHQ - 2 Score 0 0 0 0 0  Altered sleeping 0 0 3  0  Tired, decreased energy 0 0 0  0  Change in appetite 0 0 0  0  Feeling bad or failure about yourself  0 0 0  0  Trouble concentrating 0 0 0  0  Moving slowly or fidgety/restless 0 0 0  0  Suicidal thoughts 0 0 0  0  PHQ-9 Score 0 0 3  0  Difficult doing work/chores     Not difficult at all    phq 9 is negative   Fall Risk:    04/28/2022    2:29 PM 03/13/2022    7:56 AM 12/10/2021    8:39 AM 12/02/2021    1:47 PM 08/12/2021    7:41 AM  Fall Risk   Falls in the past year? 0 0 0 0 0  Number falls in past yr: 0 0 0 0 0  Injury with Fall? 0 0 0 0 0  Risk for fall due to : No Fall Risks No Fall Risks No Fall Risks No Fall Risks   Follow up Falls prevention discussed Falls prevention discussed Falls prevention discussed Falls prevention discussed Falls evaluation  completed      Functional Status Survey: Is the patient deaf or have difficulty hearing?: No Does the patient have difficulty seeing, even when wearing glasses/contacts?: No Does the patient have difficulty  concentrating, remembering, or making decisions?: No Does the patient have difficulty walking or climbing stairs?: No Does the patient have difficulty dressing or bathing?: No Does the patient have difficulty doing errands alone such as visiting a doctor's office or shopping?: No    Assessment & Plan  1. Chronic left-sided low back pain without sciatica   2. Need for pneumococcal vaccine  - Pneumococcal conjugate vaccine 20-valent (Prevnar 20)  3. Need for immunization against influenza  - Flu Vaccine QUAD High Dose(Fluad)  4. Chronic, continuous use of opioids  He is scheduled to see another pain clinic

## 2022-04-28 ENCOUNTER — Ambulatory Visit (INDEPENDENT_AMBULATORY_CARE_PROVIDER_SITE_OTHER): Payer: Medicare Other | Admitting: Family Medicine

## 2022-04-28 ENCOUNTER — Encounter: Payer: Self-pay | Admitting: Family Medicine

## 2022-04-28 VITALS — BP 120/72 | HR 73 | Resp 16 | Ht 69.0 in | Wt 223.0 lb

## 2022-04-28 DIAGNOSIS — G8929 Other chronic pain: Secondary | ICD-10-CM | POA: Diagnosis not present

## 2022-04-28 DIAGNOSIS — M545 Low back pain, unspecified: Secondary | ICD-10-CM | POA: Insufficient documentation

## 2022-04-28 DIAGNOSIS — F1190 Opioid use, unspecified, uncomplicated: Secondary | ICD-10-CM

## 2022-04-28 DIAGNOSIS — Z23 Encounter for immunization: Secondary | ICD-10-CM | POA: Diagnosis not present

## 2022-04-28 NOTE — Patient Instructions (Signed)
[facility] (Attending) - neurologist  [phone] (Work) [phone] (Fax) [phone] [employer] [place], [place] [address]

## 2022-06-01 ENCOUNTER — Encounter (INDEPENDENT_AMBULATORY_CARE_PROVIDER_SITE_OTHER): Payer: Self-pay

## 2022-07-14 ENCOUNTER — Ambulatory Visit: Payer: Medicare Other | Admitting: Family Medicine

## 2022-07-29 NOTE — Progress Notes (Signed)
Name: Jimmy Green   MRN: [id number]    DOB: Jun 05, 1951   Date:07/30/2022       Progress Note  Subjective  Chief Complaint  Follow Up  HPI  HTN: bp is at goal on Benicar HCTZ 40/12.5 mg, metoprolol and Norvasc He no longer has chest pain .   DM: he has not been checking his glucose lately, A1C was 8..6 % down  to  7.1 %, 6.7 % and up to  6.9 %  and today is 7.5 % , he is on [place] and Metformin since he could not tolerate Rybelsus. He has associated obesity, dyslipidemia, HTN. He is on statin therapy and Benicar He needs an eye exam   Depression Major Recurrent: symptoms started when he lost his job in 2016, he is back on Wellbutrin 450  mg and Lexapro 20 mg daily.He was having insomnia and we added seroquel but it kept him up, he has been anpping during the day for one hour and sleeping better at night   CAD with Angina: medically managed, on aspirin beta blocker and statin therapy , no pain at this time , denies SOB. He is under the care of cardiologist. Denies chest pain, palpitation he has stable decrease in exercise tolerance with moderate activity   History of left internal jugular vein thrombosis: he was seen by vascular surgeon and took aspirin and Eliquis for 3 months, repeat US was clear and he has been off blood thinners since Nov 2022. 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  Unchanged    Headaches with migraine features: usually above right eye, throbbing like and aching,associated with photophobia and phonophobia,  he is on Aimovig once a month but still has about 6 episodes of migraine per month that can last hours, he needs to go to bed and apply pressure to his right eye. He has seen neurologist in the past and Dr. Manuella Ghazi discussed a nerve block we will place another referral today   Obesity: doing better, BMI is now below 35  . He is eating only once a day and smaller portions. Weight has been stable, but not as healthy lately due to the holidays   Patient Active  Problem List   Diagnosis Date Noted   Chronic left-sided low back pain without sciatica 04/28/2022   History of thrombosis of internal jugular vein 03/13/2022   Recurrent major depression in remission ([place]) 12/10/2021   Squamous cell carcinoma in situ 12/10/2021   History of DVT in adulthood 03/03/2021   Personal history of colonic polyps    Polyp of transverse colon    BPH with obstruction/lower urinary tract symptoms 05/20/2018   History of basal cell carcinoma 03/23/2018   Primary localized osteoarthritis of left hip 04/06/2017   Dyslipidemia associated with type 2 diabetes mellitus ([place]) 10/04/2016   Trigger thumb of both thumbs 06/02/2016   Chronic tension-type headache, intractable 09/25/2015   Benign paroxysmal positional vertigo due to bilateral vestibular disorder 09/25/2015   Hearing loss sensory, bilateral 08/26/2015   Cerebral microvascular disease 08/26/2015   Hypertension, benign 03/28/2015   Gastroesophageal reflux disease without esophagitis 03/28/2015   Hyperlipidemia 03/28/2015   CAD in native artery 03/28/2015   Actinic keratosis 02/14/2015   Obstructive sleep apnea 02/03/2015   Diverticulosis of colon without hemorrhage 01/31/2015   Hx of colonic polyps    Benign neoplasm of descending colon    Benign neoplasm of sigmoid colon    Sebaceous cyst 09/14/2014    Past Surgical History:  Procedure Laterality Date   BACK SURGERY     BASAL CELL CARCINOMA EXCISION Left 12/02/2015   Chest-Done by Dermatologist    BASAL CELL CARCINOMA EXCISION Left 02/21/2018   Nodulocytic pattern, deep margin involved - [facility] - Dr. Brendolyn Patty   BASAL CELL CARCINOMA EXCISION  02/23/2018   CARDIAC CATHETERIZATION Left 04/01/2016   Procedure: Left Heart Cath and Coronary Angiography;  Surgeon: Teodoro Spray, MD;  Location: [place] [facility];  Service: Cardiovascular;  Laterality: Left;   CARDIAC CATHETERIZATION N/A 04/01/2016   Procedure: Intravascular Pressure  Wire/FFR Study;  Surgeon: Isaias Cowman, MD;  Location: [place] [facility];  Service: Cardiovascular;  Laterality: N/A;   COLONOSCOPY N/A 01/15/2015   Wohl-ileitis, 2 benign polyps, cryptitis, sigmoid diverticulosis, focal ulceration ICV   COLONOSCOPY WITH PROPOFOL N/A 08/29/2020   Procedure: COLONOSCOPY WITH PROPOFOL;  Surgeon: Lucilla Lame, MD;  Location: [facility] ENDOSCOPY;  Service: Endoscopy;  Laterality: N/A;   CORONARY STENT PLACEMENT     FRACTIONAL FLOW RESERVE WIRE  10/08/2011   Procedure: FRACTIONAL FLOW RESERVE WIRE;  Surgeon: Clent Demark, MD;  Location: [facility];  Service: Cardiovascular;;   HERNIA REPAIR     INTRAVASCULAR PRESSURE WIRE/FFR STUDY N/A 09/09/2021   Procedure: INTRAVASCULAR PRESSURE WIRE/FFR STUDY;  Surgeon: Andrez Grime, MD;  Location: [place] [facility];  Service: Cardiovascular;  Laterality: N/A;   KNEE SURGERY     LEFT HEART CATH AND CORONARY ANGIOGRAPHY N/A 09/09/2021   Procedure: LEFT HEART CATH AND CORONARY ANGIOGRAPHY;  Surgeon: Andrez Grime, MD;  Location: [place] [facility];  Service: Cardiovascular;  Laterality: N/A;   LEFT HEART CATHETERIZATION WITH CORONARY ANGIOGRAM N/A 10/08/2011   Procedure: LEFT HEART CATHETERIZATION WITH CORONARY ANGIOGRAM;  Surgeon: Clent Demark, MD;  Location: [place] [facility];  Service: Cardiovascular;  Laterality: N/A;   parodectomy Left 05/07/2020   SKIN CANCER EXCISION  01/2018   [facility]   TOTAL HIP ARTHROPLASTY Left 04/06/2017   Procedure: TOTAL HIP ARTHROPLASTY ANTERIOR APPROACH;  Surgeon: Hessie Knows, MD;  Location: [facility];  Service: Orthopedics;  Laterality: Left;    Family History  Problem Relation Age of Onset   Lung cancer Father    Heart disease Father    Skin cancer Father     Social History   Tobacco Use   Smoking status: Former    Years: 40.00    Types: Cigarettes    Quit date: 08/03/2012    Years since quitting: 9.9   Smokeless tobacco: Never  Substance Use Topics    Alcohol use: No    Alcohol/week: 0.0 standard drinks of alcohol     Current Outpatient Medications:    amLODipine (NORVASC) 2.5 MG tablet, Take 1 tablet (2.5 mg total) by mouth daily., Disp: 90 tablet, Rfl: 1   aspirin EC 81 MG tablet, Take 81 mg by mouth every morning. , Disp: , Rfl:    buPROPion (WELLBUTRIN XL) 150 MG 24 hr tablet, Take 1 tablet (150 mg total) by mouth See admin instructions. Take with 300 mg for a total of 450 mg daily, Disp: 90 tablet, Rfl: 1   buPROPion (WELLBUTRIN XL) 300 MG 24 hr tablet, Take 1 tablet (300 mg total) by mouth See admin instructions. Take with 150 mg for a total of 450 mg daily, Disp: 90 tablet, Rfl: 1   dapagliflozin propanediol (FARXIGA) 10 MG TABS tablet, Take 1 tablet (10 mg total) by mouth daily before breakfast., Disp: 90 tablet, Rfl: 1  Erenumab-aooe (AIMOVIG) 140 MG/ML SOAJ, Inject 140 mg into the skin every 30 (thirty) days., Disp: 1.12 mL, Rfl: 5   escitalopram (LEXAPRO) 20 MG tablet, Take 1 tablet (20 mg total) by mouth daily., Disp: 90 tablet, Rfl: 1   gabapentin (NEURONTIN) 400 MG capsule, Take by mouth., Disp: , Rfl:    metFORMIN (GLUCOPHAGE-XR) 750 MG 24 hr tablet, Take 2 tablets (1,500 mg total) by mouth daily with breakfast., Disp: 180 tablet, Rfl: 1   methocarbamol (ROBAXIN) 750 MG tablet, Take 750 mg by mouth., Disp: , Rfl:    metoprolol succinate (TOPROL-XL) 50 MG 24 hr tablet, Take 1 tablet (50 mg total) by mouth every evening. Take with or immediately following a meal., Disp: 90 tablet, Rfl: 1   NARCAN 4 MG/0.1ML LIQD nasal spray kit, 0.4 mg once., Disp: , Rfl: 0   nitroGLYCERIN (NITROSTAT) 0.4 MG SL tablet, DISSOLVE 1 TABLET UNDER THE TONGUE EVERY 5 MINUTES FOR UP TO 3 DOSES AS NEEDED FOR CHEST PAIN. IF NO RELIEF AFTER 3 DOSES, CALL 911 OR GO TO ER., Disp: 25 tablet, Rfl: 0   olmesartan-hydrochlorothiazide (BENICAR HCT) 40-12.5 MG tablet, Take 1 tablet by mouth daily., Disp: 90 tablet, Rfl: 1   omeprazole (PRILOSEC) 40 MG capsule,  Take 1 capsule (40 mg total) by mouth daily., Disp: 90 capsule, Rfl: 1   oxyCODONE (ROXICODONE) 15 MG immediate release tablet, Take 7.5-15 mg by mouth 5 (five) times daily as needed for pain., Disp: , Rfl:    rosuvastatin (CRESTOR) 40 MG tablet, Take 1 tablet (40 mg total) by mouth daily., Disp: 90 tablet, Rfl: 3   diclofenac Sodium (VOLTAREN) 1 % GEL, , Disp: , Rfl:    fluorouracil (EFUDEX) 5 % cream, Apply topically 2 (two) times daily. Apply to area for 14 days to left lower lip (Patient not taking: Reported on 07/30/2022), Disp: 15 g, Rfl: 1   gabapentin (NEURONTIN) 300 MG capsule, Take 300 mg by mouth 5 (five) times daily. (Patient not taking: Reported on 07/30/2022), Disp: , Rfl:    ketoconazole (NIZORAL) 2 % cream, Apply to rash behind ears 1-2 times a day as directed. (Patient not taking: Reported on 07/30/2022), Disp: 30 g, Rfl: 2   lidocaine (LIDODERM) 5 %, Place 1-2 patches onto the skin every 12 (twelve) hours. If tolerated (Patient not taking: Reported on 07/30/2022), Disp: , Rfl:    metroNIDAZOLE (METROGEL) 0.75 % gel, Apply to face 1-2 times a day for roscea. (Patient not taking: Reported on 07/30/2022), Disp: 45 g, Rfl: 5  Allergies  Allergen Reactions   Shingrix [Zoster Vac Recomb Adjuvanted]     Rash    Elavil [Amitriptyline Hcl] Rash   Tape Rash    Paper Tape    I personally reviewed active problem list, medication list, allergies, family history, social history, health maintenance with the patient/caregiver today.   ROS  Constitutional: Negative for fever or weight change.  Respiratory: Negative for cough and shortness of breath.   Cardiovascular: Negative for chest pain or palpitations.  Gastrointestinal: Negative for abdominal pain, no bowel changes.  Musculoskeletal: Negative for gait problem or joint swelling.  Skin: Negative for rash.  Neurological: Negative for dizziness , positive for  headache.  No other specific complaints in a complete review of systems  (except as listed in HPI above).   Objective  Vitals:   07/30/22 0957  BP: 130/78  Pulse: 79  Resp: 16  Temp: 98.2 F (36.8 C)  TempSrc: Oral  SpO2: 93%  Weight:  223 lb 12.8 oz (101.5 kg)  Height: _0  (1.727 m)    Body mass index is 34.03 kg/m.  Physical Exam  Constitutional: Patient appears well-developed and well-nourished. Obese  No distress.  HEENT: head atraumatic, normocephalic, pupils equal and reactive to light, neck supple Cardiovascular: Normal rate, regular rhythm and normal heart sounds.  No murmur heard. No BLE edema. Pulmonary/Chest: Effort normal and breath sounds normal. No respiratory distress. Abdominal: Soft.  There is no tenderness. Psychiatric: Patient has a normal mood and affect. behavior is normal. Judgment and thought content normal.   Recent Results (from the past 2160 hour(s))  POCT HgB A1C     Status: Abnormal   Collection Time: 07/30/22 10:01 AM  Result Value Ref Range   Hemoglobin A1C 7.5 (A) 4.0 - 5.6 %   HbA1c POC (<> result, manual entry)     HbA1c, POC (prediabetic range)     HbA1c, POC (controlled diabetic range)       PHQ2/9:    07/30/2022   10:00 AM 04/28/2022    2:29 PM 03/13/2022    7:56 AM 12/10/2021    8:39 AM 12/02/2021    1:45 PM  Depression screen PHQ 2/9  Decreased Interest 0 0 0 0 0  Down, Depressed, Hopeless 0 0 0 0 0  PHQ - 2 Score 0 0 0 0 0  Altered sleeping 0 0 0 3   Tired, decreased energy 0 0 0 0   Change in appetite 0 0 0 0   Feeling bad or failure about yourself  0 0 0 0   Trouble concentrating 0 0 0 0   Moving slowly or fidgety/restless 0 0 0 0   Suicidal thoughts 0 0 0 0   PHQ-9 Score 0 0 0 3     phq 9 is negative   Fall Risk:    07/30/2022    9:59 AM 04/28/2022    2:29 PM 03/13/2022    7:56 AM 12/10/2021    8:39 AM 12/02/2021    1:47 PM  Fall Risk   Falls in the past year? 0 0 0 0 0  Number falls in past yr:  0 0 0 0  Injury with Fall?  0 0 0 0  Risk for fall due to : No Fall Risks No Fall  Risks No Fall Risks No Fall Risks No Fall Risks  Follow up Falls prevention discussed;Falls evaluation completed;Education provided Falls prevention discussed Falls prevention discussed Falls prevention discussed Falls prevention discussed      Functional Status Survey: Is the patient deaf or have difficulty hearing?: Yes Does the patient have difficulty seeing, even when wearing glasses/contacts?: No Does the patient have difficulty concentrating, remembering, or making decisions?: No Does the patient have difficulty walking or climbing stairs?: Yes Does the patient have difficulty dressing or bathing?: No Does the patient have difficulty doing errands alone such as visiting a doctor's office or shopping?: No    Assessment & Plan  1. Dyslipidemia associated with type 2 diabetes mellitus (HCC)  - POCT HgB A1C - dapagliflozin propanediol (FARXIGA) 10 MG TABS tablet; Take 1 tablet (10 mg total) by mouth daily before breakfast.  Dispense: 90 tablet; Refill: 1 - metFORMIN (GLUCOPHAGE-XR) 750 MG 24 hr tablet; Take 2 tablets (1,500 mg total) by mouth daily with breakfast.  Dispense: 180 tablet; Refill: 1 - rosuvastatin (CRESTOR) 40 MG tablet; Take 1 tablet (40 mg total) by mouth daily.  Dispense: 90 tablet; Refill: 3  2. Hypertension,  benign  - amLODipine (NORVASC) 2.5 MG tablet; Take 1 tablet (2.5 mg total) by mouth daily.  Dispense: 90 tablet; Refill: 1 - olmesartan-hydrochlorothiazide (BENICAR HCT) 40-12.5 MG tablet; Take 1 tablet by mouth daily.  Dispense: 90 tablet; Refill: 1 - metoprolol succinate (TOPROL-XL) 50 MG 24 hr tablet; Take 1 tablet (50 mg total) by mouth every evening. Take with or immediately following a meal.  Dispense: 90 tablet; Refill: 1  3. Hypertension associated with diabetes ([place])  - amLODipine (NORVASC) 2.5 MG tablet; Take 1 tablet (2.5 mg total) by mouth daily.  Dispense: 90 tablet; Refill: 1 - dapagliflozin propanediol (FARXIGA) 10 MG TABS tablet; Take 1  tablet (10 mg total) by mouth daily before breakfast.  Dispense: 90 tablet; Refill: 1 - metoprolol succinate (TOPROL-XL) 50 MG 24 hr tablet; Take 1 tablet (50 mg total) by mouth every evening. Take with or immediately following a meal.  Dispense: 90 tablet; Refill: 1  4. Recurrent major depression in remission (HCC)  - buPROPion (WELLBUTRIN XL) 300 MG 24 hr tablet; Take 1 tablet (300 mg total) by mouth See admin instructions. Take with 150 mg for a total of 450 mg daily  Dispense: 90 tablet; Refill: 1 - buPROPion (WELLBUTRIN XL) 150 MG 24 hr tablet; Take 1 tablet (150 mg total) by mouth See admin instructions. Take with 300 mg for a total of 450 mg daily  Dispense: 90 tablet; Refill: 1 - escitalopram (LEXAPRO) 20 MG tablet; Take 1 tablet (20 mg total) by mouth daily.  Dispense: 90 tablet; Refill: 1  5. Gastroesophageal reflux disease without esophagitis  - omeprazole (PRILOSEC) 40 MG capsule; Take 1 capsule (40 mg total) by mouth daily.  Dispense: 90 capsule; Refill: 1  6. Unstable angina (HCC)  - metoprolol succinate (TOPROL-XL) 50 MG 24 hr tablet; Take 1 tablet (50 mg total) by mouth every evening. Take with or immediately following a meal.  Dispense: 90 tablet; Refill: 1  7. CAD in native artery  - metoprolol succinate (TOPROL-XL) 50 MG 24 hr tablet; Take 1 tablet (50 mg total) by mouth every evening. Take with or immediately following a meal.  Dispense: 90 tablet; Refill: 1  8. Migraine without aura and without status migrainosus, not intractable  - Erenumab-aooe (AIMOVIG) 140 MG/ML SOAJ; Inject 140 mg into the skin every 30 (thirty) days.  Dispense: 1.12 mL; Refill: 5 - Ambulatory referral to Neurology   9. History of recent fall   He states he was going down steps and left leg gave away, he did not hit his head, he got up and was able to walk

## 2022-07-30 ENCOUNTER — Ambulatory Visit (INDEPENDENT_AMBULATORY_CARE_PROVIDER_SITE_OTHER): Payer: Medicare Other | Admitting: Family Medicine

## 2022-07-30 ENCOUNTER — Encounter: Payer: Self-pay | Admitting: Family Medicine

## 2022-07-30 VITALS — BP 130/78 | HR 79 | Temp 98.2°F | Resp 16 | Ht 68.0 in | Wt 223.8 lb

## 2022-07-30 DIAGNOSIS — I2510 Atherosclerotic heart disease of native coronary artery without angina pectoris: Secondary | ICD-10-CM

## 2022-07-30 DIAGNOSIS — E1169 Type 2 diabetes mellitus with other specified complication: Secondary | ICD-10-CM | POA: Diagnosis not present

## 2022-07-30 DIAGNOSIS — E785 Hyperlipidemia, unspecified: Secondary | ICD-10-CM | POA: Diagnosis not present

## 2022-07-30 DIAGNOSIS — F3340 Major depressive disorder, recurrent, in remission, unspecified: Secondary | ICD-10-CM

## 2022-07-30 DIAGNOSIS — K219 Gastro-esophageal reflux disease without esophagitis: Secondary | ICD-10-CM

## 2022-07-30 DIAGNOSIS — I10 Essential (primary) hypertension: Secondary | ICD-10-CM | POA: Diagnosis not present

## 2022-07-30 DIAGNOSIS — E1159 Type 2 diabetes mellitus with other circulatory complications: Secondary | ICD-10-CM | POA: Diagnosis not present

## 2022-07-30 DIAGNOSIS — Z9181 History of falling: Secondary | ICD-10-CM

## 2022-07-30 DIAGNOSIS — I200 Unstable angina: Secondary | ICD-10-CM

## 2022-07-30 DIAGNOSIS — G43009 Migraine without aura, not intractable, without status migrainosus: Secondary | ICD-10-CM

## 2022-07-30 DIAGNOSIS — I152 Hypertension secondary to endocrine disorders: Secondary | ICD-10-CM

## 2022-07-30 LAB — POCT GLYCOSYLATED HEMOGLOBIN (HGB A1C): Hemoglobin A1C: 7.5 % — AB (ref 4.0–5.6)

## 2022-07-30 MED ORDER — BUPROPION HCL ER (XL) 150 MG PO TB24: 150.0000 mg | ORAL_TABLET | ORAL | 1 refills | Status: DC

## 2022-07-30 MED ORDER — OLMESARTAN MEDOXOMIL-HCTZ 40-12.5 MG PO TABS: 1.0000 | ORAL_TABLET | Freq: Every day | ORAL | 1 refills | Status: DC

## 2022-07-30 MED ORDER — METOPROLOL SUCCINATE ER 50 MG PO TB24: 50.0000 mg | ORAL_TABLET | Freq: Every evening | ORAL | 1 refills | Status: DC

## 2022-07-30 MED ORDER — OMEPRAZOLE 40 MG PO CPDR: 40.0000 mg | DELAYED_RELEASE_CAPSULE | Freq: Every day | ORAL | 1 refills | Status: DC

## 2022-07-30 MED ORDER — NURTEC 75 MG PO TBDP: 1.0000 | ORAL_TABLET | Freq: Every day | ORAL | 5 refills | Status: DC | PRN

## 2022-07-30 MED ORDER — AIMOVIG 140 MG/ML ~~LOC~~ SOAJ: 1.0000 | SUBCUTANEOUS | 5 refills | Status: DC

## 2022-07-30 MED ORDER — METFORMIN HCL ER 750 MG PO TB24: 1500.0000 mg | ORAL_TABLET | Freq: Every day | ORAL | 1 refills | Status: DC

## 2022-07-30 MED ORDER — ROSUVASTATIN CALCIUM 40 MG PO TABS: 40.0000 mg | ORAL_TABLET | Freq: Every day | ORAL | 3 refills | Status: DC

## 2022-07-30 MED ORDER — BUPROPION HCL ER (XL) 300 MG PO TB24: 300.0000 mg | ORAL_TABLET | ORAL | 1 refills | Status: DC

## 2022-07-30 MED ORDER — DAPAGLIFLOZIN PROPANEDIOL 10 MG PO TABS: 10.0000 mg | ORAL_TABLET | Freq: Every day | ORAL | 1 refills | Status: DC

## 2022-07-30 MED ORDER — AMLODIPINE BESYLATE 2.5 MG PO TABS: 2.5000 mg | ORAL_TABLET | Freq: Every day | ORAL | 1 refills | Status: DC

## 2022-07-30 MED ORDER — ESCITALOPRAM OXALATE 20 MG PO TABS: 20.0000 mg | ORAL_TABLET | Freq: Every day | ORAL | 1 refills | Status: DC

## 2022-07-31 ENCOUNTER — Telehealth: Payer: Self-pay | Admitting: Family Medicine

## 2022-07-31 ENCOUNTER — Other Ambulatory Visit: Payer: Self-pay | Admitting: Family Medicine

## 2022-07-31 MED ORDER — NURTEC 75 MG PO TBDP: 1.0000 | ORAL_TABLET | Freq: Every day | ORAL | 5 refills | Status: DC | PRN

## 2022-07-31 NOTE — Telephone Encounter (Signed)
Called [employer] and gave verbal order/PA for new quantity. Approved and updated

## 2022-07-31 NOTE — Telephone Encounter (Signed)
Kim from [facility] called in asking if qyt 18 in a 30 day period is sufficient for the patient, since that's what his plan covers. Please call back

## 2022-08-04 DIAGNOSIS — G2581 Restless legs syndrome: Secondary | ICD-10-CM | POA: Diagnosis not present

## 2022-08-04 DIAGNOSIS — M5136 Other intervertebral disc degeneration, lumbar region: Secondary | ICD-10-CM | POA: Diagnosis not present

## 2022-08-04 DIAGNOSIS — R202 Paresthesia of skin: Secondary | ICD-10-CM | POA: Diagnosis not present

## 2022-08-10 ENCOUNTER — Other Ambulatory Visit: Payer: Self-pay | Admitting: Family Medicine

## 2022-08-10 DIAGNOSIS — I2510 Atherosclerotic heart disease of native coronary artery without angina pectoris: Secondary | ICD-10-CM

## 2022-08-20 DIAGNOSIS — G4733 Obstructive sleep apnea (adult) (pediatric): Secondary | ICD-10-CM | POA: Diagnosis not present

## 2022-08-20 DIAGNOSIS — E785 Hyperlipidemia, unspecified: Secondary | ICD-10-CM | POA: Diagnosis not present

## 2022-08-20 DIAGNOSIS — Z85828 Personal history of other malignant neoplasm of skin: Secondary | ICD-10-CM | POA: Diagnosis not present

## 2022-08-20 DIAGNOSIS — E1169 Type 2 diabetes mellitus with other specified complication: Secondary | ICD-10-CM | POA: Diagnosis not present

## 2022-08-20 DIAGNOSIS — R519 Headache, unspecified: Secondary | ICD-10-CM | POA: Diagnosis not present

## 2022-08-20 DIAGNOSIS — I82C12 Acute embolism and thrombosis of left internal jugular vein: Secondary | ICD-10-CM | POA: Diagnosis not present

## 2022-09-01 DIAGNOSIS — R202 Paresthesia of skin: Secondary | ICD-10-CM | POA: Diagnosis not present

## 2022-09-01 DIAGNOSIS — M5136 Other intervertebral disc degeneration, lumbar region: Secondary | ICD-10-CM | POA: Diagnosis not present

## 2022-09-01 DIAGNOSIS — Z5181 Encounter for therapeutic drug level monitoring: Secondary | ICD-10-CM | POA: Diagnosis not present

## 2022-09-01 DIAGNOSIS — G2581 Restless legs syndrome: Secondary | ICD-10-CM | POA: Diagnosis not present

## 2022-09-01 DIAGNOSIS — Z79891 Long term (current) use of opiate analgesic: Secondary | ICD-10-CM | POA: Diagnosis not present

## 2022-09-14 NOTE — Progress Notes (Unsigned)
Name: Jimmy Green   MRN: [id number]    DOB: [birth year]   Date:09/15/2022       Progress Note  Subjective  Chief Complaint  Back Pain  HPI  Left upper back pain: he states woke up about 3 weeks ago with a sharp and intense pain on a very localized spot on left mid to upper back. Worse with movement, pain is 8/10 most of the time but gets worse with movement or when standing or laying down on lateral decubitus. No rashes, symptoms are not getting better. He goes to a pain clinic and takes pain medication and a muscle relaxer but is not improving symptoms. No hematuria, fever, chills or dysuria/urgency. Denies bowel or bladder incontinence.   DMII: he has DM, Last A1C was 7.5 % explained that trigger point injection with steroids will increase his glucose level and needs to be very compliant with diabetic diet over the next few days.   Patient Active Problem List   Diagnosis Date Noted   Chronic left-sided low back pain without sciatica 04/28/2022   History of thrombosis of internal jugular vein 03/13/2022   Recurrent major depression in remission ([place]) 12/10/2021   Squamous cell carcinoma in situ 12/10/2021   History of DVT in adulthood 03/03/2021   Personal history of colonic polyps    Polyp of transverse colon    BPH with obstruction/lower urinary tract symptoms 05/20/2018   History of basal cell carcinoma 03/23/2018   Primary localized osteoarthritis of left hip 04/06/2017   Dyslipidemia associated with type 2 diabetes mellitus ([place]) 10/04/2016   Trigger thumb of both thumbs 06/02/2016   Chronic tension-type headache, intractable 09/25/2015   Benign paroxysmal positional vertigo due to bilateral vestibular disorder 09/25/2015   Hearing loss sensory, bilateral 08/26/2015   Cerebral microvascular disease 08/26/2015   Hypertension, benign 03/28/2015   Gastroesophageal reflux disease without esophagitis 03/28/2015   Hyperlipidemia 03/28/2015   CAD in native artery 03/28/2015    Actinic keratosis 02/14/2015   Obstructive sleep apnea 02/03/2015   Diverticulosis of colon without hemorrhage 01/31/2015   Hx of colonic polyps    Benign neoplasm of descending colon    Benign neoplasm of sigmoid colon    Sebaceous cyst 09/14/2014    Past Surgical History:  Procedure Laterality Date   BACK SURGERY     BASAL CELL CARCINOMA EXCISION Left 12/02/2015   Chest-Done by Dermatologist    BASAL CELL CARCINOMA EXCISION Left 02/21/2018   Nodulocytic pattern, deep margin involved - [facility] - Dr. Brendolyn Patty   BASAL CELL CARCINOMA EXCISION  02/23/2018   CARDIAC CATHETERIZATION Left 04/01/2016   Procedure: Left Heart Cath and Coronary Angiography;  Surgeon: Teodoro Spray, MD;  Location: [place];  Service: Cardiovascular;  Laterality: Left;   CARDIAC CATHETERIZATION N/A 04/01/2016   Procedure: Intravascular Pressure Wire/FFR Study;  Surgeon: Isaias Cowman, MD;  Location: [place] [facility];  Service: Cardiovascular;  Laterality: N/A;   COLONOSCOPY N/A 01/15/2015   Wohl-ileitis, 2 benign polyps, cryptitis, sigmoid diverticulosis, focal ulceration ICV   COLONOSCOPY WITH PROPOFOL N/A 08/29/2020   Procedure: COLONOSCOPY WITH PROPOFOL;  Surgeon: Lucilla Lame, MD;  Location: [facility] ENDOSCOPY;  Service: Endoscopy;  Laterality: N/A;   CORONARY STENT PLACEMENT     FRACTIONAL FLOW RESERVE WIRE  10/08/2011   Procedure: FRACTIONAL FLOW RESERVE WIRE;  Surgeon: Clent Demark, MD;  Location: [facility];  Service: Cardiovascular;;   HERNIA REPAIR     INTRAVASCULAR PRESSURE WIRE/FFR STUDY N/A  09/09/2021   Procedure: INTRAVASCULAR PRESSURE WIRE/FFR STUDY;  Surgeon: Andrez Grime, MD;  Location: [place] [facility];  Service: Cardiovascular;  Laterality: N/A;   KNEE SURGERY     LEFT HEART CATH AND CORONARY ANGIOGRAPHY N/A 09/09/2021   Procedure: LEFT HEART CATH AND CORONARY ANGIOGRAPHY;  Surgeon: Andrez Grime, MD;  Location: [place] [facility];  Service:  Cardiovascular;  Laterality: N/A;   LEFT HEART CATHETERIZATION WITH CORONARY ANGIOGRAM N/A 10/08/2011   Procedure: LEFT HEART CATHETERIZATION WITH CORONARY ANGIOGRAM;  Surgeon: Clent Demark, MD;  Location: [place];  Service: Cardiovascular;  Laterality: N/A;   parodectomy Left 05/07/2020   SKIN CANCER EXCISION  01/2018   [facility]   TOTAL HIP ARTHROPLASTY Left 04/06/2017   Procedure: TOTAL HIP ARTHROPLASTY ANTERIOR APPROACH;  Surgeon: Hessie Knows, MD;  Location: [facility];  Service: Orthopedics;  Laterality: Left;    Family History  Problem Relation Age of Onset   Lung cancer Father    Heart disease Father    Skin cancer Father     Social History   Tobacco Use   Smoking status: Former    Years: 40.00    Types: Cigarettes    Quit date: 08/03/2012    Years since quitting: 10.1   Smokeless tobacco: Never  Substance Use Topics   Alcohol use: No    Alcohol/week: 0.0 standard drinks of alcohol     Current Outpatient Medications:    amLODipine (NORVASC) 2.5 MG tablet, Take 1 tablet (2.5 mg total) by mouth daily., Disp: 90 tablet, Rfl: 1   aspirin EC 81 MG tablet, Take 81 mg by mouth every morning. , Disp: , Rfl:    buPROPion (WELLBUTRIN XL) 150 MG 24 hr tablet, Take 1 tablet (150 mg total) by mouth See admin instructions. Take with 300 mg for a total of 450 mg daily, Disp: 90 tablet, Rfl: 1   buPROPion (WELLBUTRIN XL) 300 MG 24 hr tablet, Take 1 tablet (300 mg total) by mouth See admin instructions. Take with 150 mg for a total of 450 mg daily, Disp: 90 tablet, Rfl: 1   dapagliflozin propanediol (FARXIGA) 10 MG TABS tablet, Take 1 tablet (10 mg total) by mouth daily before breakfast., Disp: 90 tablet, Rfl: 1   Erenumab-aooe (AIMOVIG) 140 MG/ML SOAJ, Inject 140 mg into the skin every 30 (thirty) days., Disp: 1.12 mL, Rfl: 5   escitalopram (LEXAPRO) 20 MG tablet, Take 1 tablet (20 mg total) by mouth daily., Disp: 90 tablet, Rfl: 1   gabapentin (NEURONTIN) 400 MG capsule,  Take 400 mg by mouth 4 (four) times daily., Disp: , Rfl:    metFORMIN (GLUCOPHAGE-XR) 750 MG 24 hr tablet, Take 2 tablets (1,500 mg total) by mouth daily with breakfast., Disp: 180 tablet, Rfl: 1   metoprolol succinate (TOPROL-XL) 50 MG 24 hr tablet, Take 1 tablet (50 mg total) by mouth every evening. Take with or immediately following a meal., Disp: 90 tablet, Rfl: 1   NARCAN 4 MG/0.1ML LIQD nasal spray kit, 0.4 mg once., Disp: , Rfl: 0   nitroGLYCERIN (NITROSTAT) 0.4 MG SL tablet, DISSOLVE 1 TABLET UNDER THE TONGUE EVERY 5 MINUTES FOR UP TO 3 DOSES AS NEEDED FOR CHEST PAIN. IF NO RELIEF AFTER 3 DOSES, CALL 911 OR GO TO ER., Disp: 25 tablet, Rfl: 0   olmesartan-hydrochlorothiazide (BENICAR HCT) 40-12.5 MG tablet, Take 1 tablet by mouth daily., Disp: 90 tablet, Rfl: 1   omeprazole (PRILOSEC) 40 MG capsule, Take 1 capsule (40 mg  total) by mouth daily., Disp: 90 capsule, Rfl: 1   oxyCODONE (ROXICODONE) 15 MG immediate release tablet, Take 7.5-15 mg by mouth 5 (five) times daily as needed for pain., Disp: , Rfl:    rosuvastatin (CRESTOR) 40 MG tablet, Take 1 tablet (40 mg total) by mouth daily., Disp: 90 tablet, Rfl: 3   diclofenac Sodium (VOLTAREN) 1 % GEL, , Disp: , Rfl:    ketoconazole (NIZORAL) 2 % cream, Apply to rash behind ears 1-2 times a day as directed. (Patient not taking: Reported on 07/30/2022), Disp: 30 g, Rfl: 2   lidocaine (LIDODERM) 5 %, Place 1-2 patches onto the skin every 12 (twelve) hours. If tolerated (Patient not taking: Reported on 07/30/2022), Disp: , Rfl:    metroNIDAZOLE (METROGEL) 0.75 % gel, Apply to face 1-2 times a day for roscea. (Patient not taking: Reported on 07/30/2022), Disp: 45 g, Rfl: 5   Rimegepant Sulfate (NURTEC) 75 MG TBDP, Take 1 tablet by mouth daily as needed. (Patient not taking: Reported on 09/15/2022), Disp: 18 tablet, Rfl: 5  Allergies  Allergen Reactions   Shingrix [Zoster Vac Recomb Adjuvanted]     Rash    Elavil [Amitriptyline Hcl] Rash   Tape Rash     Paper Tape    I personally reviewed active problem list, medication list, allergies, family history, social history, health maintenance with the patient/caregiver today.   ROS  Ten systems reviewed and is negative except as mentioned in HPI   Objective  Vitals:   09/15/22 0735  BP: 118/82  Pulse: 76  Resp: 16  SpO2: 95%  Weight: 229 lb (103.9 kg)  Height: 5' 9"$  (1.753 m)    Body mass index is 33.82 kg/m.  Physical Exam  Constitutional: Patient appears well-developed and well-nourished. Obese  No distress.  HEENT: head atraumatic, normocephalic, pupils equal and reactive to light, neck supple Cardiovascular: Normal rate, regular rhythm and normal heart sounds.  No murmur heard. No BLE edema. Pulmonary/Chest: Effort normal and breath sounds normal. No respiratory distress. Abdominal: Soft.  There is no tenderness. Muscular skeletal: paraspinal pain on right mid back, no rashes, pain with touch and movement, negative CVA tenderness.  Psychiatric: Patient has a normal mood and affect. behavior is normal. Judgment and thought content normal.   Recent Results (from the past 2160 hour(s))  POCT HgB A1C     Status: Abnormal   Collection Time: 07/30/22 10:01 AM  Result Value Ref Range   Hemoglobin A1C 7.5 (A) 4.0 - 5.6 %   HbA1c POC (<> result, manual entry)     HbA1c, POC (prediabetic range)     HbA1c, POC (controlled diabetic range)      PHQ2/9:    09/15/2022    7:34 AM 07/30/2022   10:00 AM 04/28/2022    2:29 PM 03/13/2022    7:56 AM 12/10/2021    8:39 AM  Depression screen PHQ 2/9  Decreased Interest 0 0 0 0 0  Down, Depressed, Hopeless 0 0 0 0 0  PHQ - 2 Score 0 0 0 0 0  Altered sleeping 0 0 0 0 3  Tired, decreased energy 0 0 0 0 0  Change in appetite 0 0 0 0 0  Feeling bad or failure about yourself  0 0 0 0 0  Trouble concentrating 0 0 0 0 0  Moving slowly or fidgety/restless 0 0 0 0 0  Suicidal thoughts 0 0 0 0 0  PHQ-9 Score 0 0 0 0 3  phq 9 is  negative   Fall Risk:    09/15/2022    7:34 AM 07/30/2022    9:59 AM 04/28/2022    2:29 PM 03/13/2022    7:56 AM 12/10/2021    8:39 AM  Fall Risk   Falls in the past year? 0 1 0 0 0  Number falls in past yr: 0 0 0 0 0  Injury with Fall? 0 0 0 0 0  Risk for fall due to : No Fall Risks History of fall(s) No Fall Risks No Fall Risks No Fall Risks  Follow up Falls prevention discussed Falls prevention discussed;Falls evaluation completed;Education provided Falls prevention discussed Falls prevention discussed Falls prevention discussed     Functional Status Survey: Is the patient deaf or have difficulty hearing?: Yes Does the patient have difficulty seeing, even when wearing glasses/contacts?: No Does the patient have difficulty concentrating, remembering, or making decisions?: No Does the patient have difficulty walking or climbing stairs?: Yes Does the patient have difficulty dressing or bathing?: No Does the patient have difficulty doing errands alone such as visiting a doctor's office or shopping?: No    Assessment & Plan  1. Acute right-sided thoracic back pain  Consent form signed Localized muscle group Injection with lidocaine 1% and Kenalog 15m/1 ml on muscle  Patient tolerated procedure well No side effects   2. Dyslipidemia associated with type 2 diabetes mellitus ([place]   Discussed importance of following a diabetic diet since we are giving him steroids today

## 2022-09-15 ENCOUNTER — Encounter: Payer: Self-pay | Admitting: Family Medicine

## 2022-09-15 ENCOUNTER — Ambulatory Visit (INDEPENDENT_AMBULATORY_CARE_PROVIDER_SITE_OTHER): Payer: 59 | Admitting: Family Medicine

## 2022-09-15 VITALS — BP 118/82 | HR 76 | Resp 16 | Ht 69.0 in | Wt 229.0 lb

## 2022-09-15 DIAGNOSIS — E785 Hyperlipidemia, unspecified: Secondary | ICD-10-CM

## 2022-09-15 DIAGNOSIS — M546 Pain in thoracic spine: Secondary | ICD-10-CM

## 2022-09-15 DIAGNOSIS — E1169 Type 2 diabetes mellitus with other specified complication: Secondary | ICD-10-CM | POA: Diagnosis not present

## 2022-09-15 MED ORDER — LIDOCAINE HCL (PF) 1 % IJ SOLN
2.0000 mL | Freq: Once | INTRAMUSCULAR | Status: AC
  Administered 2022-09-15: 2 mL via INTRADERMAL

## 2022-09-15 MED ORDER — TRIAMCINOLONE ACETONIDE 40 MG/ML IJ SUSP
40.0000 mg | Freq: Once | INTRAMUSCULAR | Status: AC
  Administered 2022-09-15: 40 mg via INTRAMUSCULAR

## 2022-09-21 ENCOUNTER — Telehealth: Payer: Self-pay | Admitting: Family Medicine

## 2022-09-21 ENCOUNTER — Encounter: Payer: Self-pay | Admitting: Family Medicine

## 2022-09-21 ENCOUNTER — Other Ambulatory Visit: Payer: Self-pay | Admitting: Family Medicine

## 2022-09-21 ENCOUNTER — Ambulatory Visit: Payer: Self-pay | Admitting: *Deleted

## 2022-09-21 ENCOUNTER — Ambulatory Visit (INDEPENDENT_AMBULATORY_CARE_PROVIDER_SITE_OTHER): Payer: 59 | Admitting: Family Medicine

## 2022-09-21 ENCOUNTER — Telehealth: Payer: Self-pay

## 2022-09-21 VITALS — BP 132/84 | HR 71 | Temp 98.0°F | Resp 16 | Ht 69.0 in | Wt 228.9 lb

## 2022-09-21 DIAGNOSIS — M546 Pain in thoracic spine: Secondary | ICD-10-CM

## 2022-09-21 DIAGNOSIS — G8929 Other chronic pain: Secondary | ICD-10-CM | POA: Diagnosis not present

## 2022-09-21 DIAGNOSIS — M545 Low back pain, unspecified: Secondary | ICD-10-CM

## 2022-09-21 MED ORDER — CELECOXIB 100 MG PO CAPS: 100.0000 mg | ORAL_CAPSULE | Freq: Two times a day (BID) | ORAL | 0 refills | Status: DC

## 2022-09-21 MED ORDER — MELOXICAM 15 MG PO TABS: 15.0000 mg | ORAL_TABLET | Freq: Every day | ORAL | 0 refills | Status: DC

## 2022-09-21 NOTE — Patient Instructions (Signed)
It was great to see you!  Our plans for today:  - Take the meloxicam daily for the next couple weeks.  - Let us know if you don't hear about an appointment with Physical Therapy in the next couple weeks.   Take care and seek immediate care sooner if you develop any concerns.   Dr. Ky Barban

## 2022-09-21 NOTE — Telephone Encounter (Signed)
Encounter is blanked.

## 2022-09-21 NOTE — Progress Notes (Signed)
   SUBJECTIVE:   CHIEF COMPLAINT / HPI:   BACK PAIN - h/o chronic L LBP w/o sciatica, on lidoderm, prn oxycodone - seen previously 2/13 for R thoracic back pain x3 weeks, given trigger point injection with lidocaine, kenalog. Tolerated procedure well. Reports helped some that day but pain returned and feels worse. Denies new injury. - MRI lumbar spine 2019 with mild lumbar spine spondylosis - has tried 863m ibuprofen once daily for a few days which helps some.  - has tried muscle relaxer with pain clinic but not helpful.  Duration:  1 month Mechanism of injury: no trauma but reports falling over Christmas (leg gave out). Location: Right and low back Quality: sharp, throbbing Frequency: constant Radiation: R side ribs Aggravating factors: movement and bending Alleviating factors: NSAIDs, standing Status: worse Treatments attempted: ice, heat, and ibuprofen, narcotics, muscle relaxer,   Relief with NSAIDs?: mild Nighttime pain:  yes Paresthesias / decreased sensation:  no Bowel / bladder incontinence:  no Fevers:  no Dysuria / urinary frequency:  no   OBJECTIVE:   BP 132/84   Pulse 71   Temp 98 F (36.7 C) (Oral)   Resp 16   Ht 5' 9"$  (1.753 m)   Wt 228 lb 14.4 oz (103.8 kg)   SpO2 97%   BMI 33.80 kg/m   Gen: well appearing, in NAD Card: RRR Lungs: CTAB Ext: WWP, no edema MSK: no midline spinal tenderness. Limited AROM in flexion, extension, sidebending 2/2 pain. TTP over R side thoracic paravertebral musculature with hypertonicity. 5/5 LE strength. No LE edema.    ASSESSMENT/PLAN:   Acute R sided thoracic back pain Symptoms and exam most consistent with muscular strain, trigger point injection minimally helpful. With some relief with ibuprofen, will trial scheduled meloxicam. Refer for PT. No red flags to warrant emergent presentation or imaging. F/u prn.    AMyles Gip DO

## 2022-09-21 NOTE — Telephone Encounter (Signed)
Patient notified

## 2022-09-21 NOTE — Telephone Encounter (Signed)
  Chief Complaint: Back pain Symptoms: Right sided thoracic area Frequency: 9/10 constant Pertinent Negatives: Patient denies dysuria Disposition: [x]$ ED /[]$ Urgent Care (no appt availability in office) / []$ Appointment(In office/virtual)/ []$  [facility]/ []$ Home Care/ []$ Refused Recommended Disposition /[]$ [place] [facility]/ []$  Follow-up with PCP Additional Notes: Pt seen 2/13, injected. States "Never really helped, pain worse now, can't sleep, can't breathe without pain."  States takes Oxy from pain clinic "That doesn't even help." States "Feels like something is in there." Advised ED, declines "Can't sit there" Reports was told to call back if unresolved. Assured pt NT would route to practice for PCPs review and final disposition. Pt verbalizes understanding.  Reason for Disposition  [1] SEVERE back pain (e.g., excruciating, unable to do any normal activities) AND [2] not improved 2 hours after pain medicine  Answer Assessment - Initial Assessment Questions 1. ONSET: "When did the pain begin?"      Injection 09/15/22 2. LOCATION: "Where does it hurt?" (upper, mid or lower back)     Right sided thoracic are 3. SEVERITY: "How bad is the pain?"  (e.g., Scale 1-10; mild, moderate, or severe)   - MILD (1-3): Doesn't interfere with normal activities.    - MODERATE (4-7): Interferes with normal activities or awakens from sleep.    - SEVERE (8-10): Excruciating pain, unable to do any normal activities.      9/10 4. PATTERN: "Is the pain constant?" (e.g., yes, no; constant, intermittent)      constant 5. RADIATION: "Does the pain shoot into your legs or somewhere else?"      6. CAUSE:  "What do you think is causing the back pain?"      Unsure 7. BACK OVERUSE:  "Any recent lifting of heavy objects, strenuous work or exercise?"     No 8. MEDICINES: "What have you taken so far for the pain?" (e.g., nothing, acetaminophen, NSAIDS OXy not helping 9. NEUROLOGIC SYMPTOMS: "Do you have any  weakness, numbness, or problems with bowel/bladder control?"     No 10. OTHER SYMPTOMS: "Do you have any other symptoms?" (e.g., fever, abdomen pain, burning with urination, blood in urine)      No  Protocols used: Back Pain-A-AH

## 2022-09-29 DIAGNOSIS — M5136 Other intervertebral disc degeneration, lumbar region: Secondary | ICD-10-CM | POA: Diagnosis not present

## 2022-09-29 DIAGNOSIS — G2581 Restless legs syndrome: Secondary | ICD-10-CM | POA: Diagnosis not present

## 2022-09-29 DIAGNOSIS — R202 Paresthesia of skin: Secondary | ICD-10-CM | POA: Diagnosis not present

## 2022-10-19 ENCOUNTER — Ambulatory Visit: Payer: Self-pay

## 2022-10-19 NOTE — Telephone Encounter (Signed)
Summary: cold and flu like symptoms / rx req   The patient is currently experiencing cold and flu like symptoms  The patient is experiencing congestion but no elevated temperature or stomach discomfort  The patient would like to be prescribed something for their congestion     Called - Left message on machine to return call.

## 2022-10-19 NOTE — Telephone Encounter (Signed)
  Chief Complaint: URI s/s Symptoms: Cough, sneezing, congestion Frequency: 3 days Pertinent Negatives: Patient denies Fever Disposition: [] ED /[] Urgent Care (no appt availability in office) / [] Appointment(In office/virtual)/ []  [facility] [facility]/ [x] Home Care/ [] Refused Recommended Disposition /[] [facility]/ []  Follow-up with PCP Additional Notes: PT has mild /moderate URI s/s. Cough, congestions and sneezing. Pt will treat at home with OTC medications. Pt will call back if needed.    Summary: cold and flu like symptoms / rx req   The patient is currently experiencing cold and flu like symptoms  The patient is experiencing congestion but no elevated temperature or stomach discomfort  The patient would like to be prescribed something for their congestion       Reason for Disposition  Cough with cold symptoms (e.g., runny nose, postnasal drip, throat clearing)  Answer Assessment - Initial Assessment Questions 1. ONSET: "When did the cough begin?"      3 days 2. SEVERITY: "How bad is the cough today?"      moderate 3. SPUTUM: "Describe the color of your sputum" (none, dry cough; clear, white, yellow, green)     no 4. HEMOPTYSIS: "Are you coughing up any blood?" If so ask: "How much?" (flecks, streaks, tablespoons, etc.)     no 5. DIFFICULTY BREATHING: "Are you having difficulty breathing?" If Yes, ask: "How bad is it?" (e.g., mild, moderate, severe)    - MILD: No SOB at rest, mild SOB with walking, speaks normally in sentences, can lie down, no retractions, pulse < 100.    - MODERATE: SOB at rest, SOB with minimal exertion and prefers to sit, cannot lie down flat, speaks in phrases, mild retractions, audible wheezing, pulse 100-120.    - SEVERE: Very SOB at rest, speaks in single words, struggling to breathe, sitting hunched forward, retractions, pulse > 120      mild 6. FEVER: "Do you have a fever?" If Yes, ask: "What is your temperature, how was it measured, and  when did it start?"     no 7. CARDIAC HISTORY: "Do you have any history of heart disease?" (e.g., heart attack, congestive heart failure)      Heart disease - [place] 8. LUNG HISTORY: "Do you have any history of lung disease?"  (e.g., pulmonary embolus, asthma, emphysema)     no 9. PE RISK FACTORS: "Do you have a history of blood clots?" (or: recent major surgery, recent prolonged travel, bedridden)     no 10. OTHER SYMPTOMS: "Do you have any other symptoms?" (e.g., runny nose, wheezing, chest pain)       Sneezing coughing and eyes burning  Protocols used: Cough - Acute Non-Productive-A-AH

## 2022-10-27 DIAGNOSIS — M5136 Other intervertebral disc degeneration, lumbar region: Secondary | ICD-10-CM | POA: Diagnosis not present

## 2022-10-27 DIAGNOSIS — G2581 Restless legs syndrome: Secondary | ICD-10-CM | POA: Diagnosis not present

## 2022-10-28 ENCOUNTER — Telehealth: Payer: Self-pay

## 2022-10-28 NOTE — Telephone Encounter (Signed)
Patient states pain clinic he was going to closed permanently without notice ([facility]) and he needs refills on his medications as they did not refill prior to closing.

## 2022-10-29 NOTE — Progress Notes (Deleted)
Name: ZHYAIRE HUBBS   MRN: [id number]    DOB: [birth year]   Date:10/29/2022       Progress Note  Subjective  Chief Complaint  Back Pain  HPI  *** Patient Active Problem List   Diagnosis Date Noted   Chronic left-sided low back pain without sciatica 04/28/2022   History of thrombosis of internal jugular vein 03/13/2022   Recurrent major depression in remission ([place]) 12/10/2021   Squamous cell carcinoma in situ 12/10/2021   History of DVT in adulthood 03/03/2021   Personal history of colonic polyps    Polyp of transverse colon    BPH with obstruction/lower urinary tract symptoms 05/20/2018   History of basal cell carcinoma 03/23/2018   Primary localized osteoarthritis of left hip 04/06/2017   Dyslipidemia associated with type 2 diabetes mellitus ([place]) 10/04/2016   Trigger thumb of both thumbs 06/02/2016   Chronic tension-type headache, intractable 09/25/2015   Benign paroxysmal positional vertigo due to bilateral vestibular disorder 09/25/2015   Hearing loss sensory, bilateral 08/26/2015   Cerebral microvascular disease 08/26/2015   Hypertension, benign 03/28/2015   Gastroesophageal reflux disease without esophagitis 03/28/2015   Hyperlipidemia 03/28/2015   CAD in native artery 03/28/2015   Actinic keratosis 02/14/2015   Obstructive sleep apnea 02/03/2015   Diverticulosis of colon without hemorrhage 01/31/2015   Hx of colonic polyps    Benign neoplasm of descending colon    Benign neoplasm of sigmoid colon    Sebaceous cyst 09/14/2014    Past Surgical History:  Procedure Laterality Date   BACK SURGERY     BASAL CELL CARCINOMA EXCISION Left 12/02/2015   Chest-Done by Dermatologist    BASAL CELL CARCINOMA EXCISION Left 02/21/2018   Nodulocytic pattern, deep margin involved - [facility] - Dr. Brendolyn Patty   BASAL CELL CARCINOMA EXCISION  02/23/2018   CARDIAC CATHETERIZATION Left 04/01/2016   Procedure: Left Heart Cath and Coronary Angiography;  Surgeon: Teodoro Spray, MD;  Location: [place] [facility];  Service: Cardiovascular;  Laterality: Left;   CARDIAC CATHETERIZATION N/A 04/01/2016   Procedure: Intravascular Pressure Wire/FFR Study;  Surgeon: Isaias Cowman, MD;  Location: [place] [facility];  Service: Cardiovascular;  Laterality: N/A;   COLONOSCOPY N/A 01/15/2015   Wohl-ileitis, 2 benign polyps, cryptitis, sigmoid diverticulosis, focal ulceration ICV   COLONOSCOPY WITH PROPOFOL N/A 08/29/2020   Procedure: COLONOSCOPY WITH PROPOFOL;  Surgeon: Lucilla Lame, MD;  Location: [facility];  Service: Endoscopy;  Laterality: N/A;   CORONARY STENT PLACEMENT     FRACTIONAL FLOW RESERVE WIRE  10/08/2011   Procedure: FRACTIONAL FLOW RESERVE WIRE;  Surgeon: Clent Demark, MD;  Location: [place] C[facility];  Service: Cardiovascular;;   HERNIA REPAIR     INTRAVASCULAR PRESSURE WIRE/FFR STUDY N/A 09/09/2021   Procedure: INTRAVASCULAR PRESSURE WIRE/FFR STUDY;  Surgeon: Andrez Grime, MD;  Location: [place] [facility];  Service: Cardiovascular;  Laterality: N/A;   KNEE SURGERY     LEFT HEART CATH AND CORONARY ANGIOGRAPHY N/A 09/09/2021   Procedure: LEFT HEART CATH AND CORONARY ANGIOGRAPHY;  Surgeon: Andrez Grime, MD;  Location: [place] [facility];  Service: Cardiovascular;  Laterality: N/A;   LEFT HEART CATHETERIZATION WITH CORONARY ANGIOGRAM N/A 10/08/2011   Procedure: LEFT HEART CATHETERIZATION WITH CORONARY ANGIOGRAM;  Surgeon: Clent Demark, MD;  Location: [place] CATH LAB;  Service: Cardiovascular;  Laterality: N/A;   parodectomy Left 05/07/2020   SKIN CANCER EXCISION  01/2018   [facility]   TOTAL HIP ARTHROPLASTY Left 04/06/2017  Procedure: TOTAL HIP ARTHROPLASTY ANTERIOR APPROACH;  Surgeon: Hessie Knows, MD;  Location: [facility] ORS;  Service: Orthopedics;  Laterality: Left;    Family History  Problem Relation Age of Onset   Lung cancer Father    Heart disease Father    Skin cancer Father     Social History   Tobacco Use    Smoking status: Former    Years: 61    Types: Cigarettes    Quit date: 08/03/2012    Years since quitting: 10.2   Smokeless tobacco: Never  Substance Use Topics   Alcohol use: No    Alcohol/week: 0.0 standard drinks of alcohol     Current Outpatient Medications:    amLODipine (NORVASC) 2.5 MG tablet, Take 1 tablet (2.5 mg total) by mouth daily., Disp: 90 tablet, Rfl: 1   aspirin EC 81 MG tablet, Take 81 mg by mouth every morning. , Disp: , Rfl:    buPROPion (WELLBUTRIN XL) 150 MG 24 hr tablet, Take 1 tablet (150 mg total) by mouth See admin instructions. Take with 300 mg for a total of 450 mg daily, Disp: 90 tablet, Rfl: 1   buPROPion (WELLBUTRIN XL) 300 MG 24 hr tablet, Take 1 tablet (300 mg total) by mouth See admin instructions. Take with 150 mg for a total of 450 mg daily, Disp: 90 tablet, Rfl: 1   celecoxib (CELEBREX) 100 MG capsule, Take 1 capsule (100 mg total) by mouth 2 (two) times daily. Do not take ibuprofen or any other nsaid's with this, Disp: 60 capsule, Rfl: 0   dapagliflozin propanediol (FARXIGA) 10 MG TABS tablet, Take 1 tablet (10 mg total) by mouth daily before breakfast., Disp: 90 tablet, Rfl: 1   diclofenac Sodium (VOLTAREN) 1 % GEL, , Disp: , Rfl:    Erenumab-aooe (AIMOVIG) 140 MG/ML SOAJ, Inject 140 mg into the skin every 30 (thirty) days., Disp: 1.12 mL, Rfl: 5   escitalopram (LEXAPRO) 20 MG tablet, Take 1 tablet (20 mg total) by mouth daily., Disp: 90 tablet, Rfl: 1   gabapentin (NEURONTIN) 400 MG capsule, Take 400 mg by mouth 4 (four) times daily., Disp: , Rfl:    lidocaine (LIDODERM) 5 %, Place 1-2 patches onto the skin every 12 (twelve) hours. If tolerated (Patient not taking: Reported on 07/30/2022), Disp: , Rfl:    metFORMIN (GLUCOPHAGE-XR) 750 MG 24 hr tablet, Take 2 tablets (1,500 mg total) by mouth daily with breakfast., Disp: 180 tablet, Rfl: 1   metoprolol succinate (TOPROL-XL) 50 MG 24 hr tablet, Take 1 tablet (50 mg total) by mouth every evening. Take with  or immediately following a meal., Disp: 90 tablet, Rfl: 1   metroNIDAZOLE (METROGEL) 0.75 % gel, Apply to face 1-2 times a day for roscea., Disp: 45 g, Rfl: 5   NARCAN 4 MG/0.1ML LIQD nasal spray kit, 0.4 mg once., Disp: , Rfl: 0   nitroGLYCERIN (NITROSTAT) 0.4 MG SL tablet, DISSOLVE 1 TABLET UNDER THE TONGUE EVERY 5 MINUTES FOR UP TO 3 DOSES AS NEEDED FOR CHEST PAIN. IF NO RELIEF AFTER 3 DOSES, CALL 911 OR GO TO ER., Disp: 25 tablet, Rfl: 0   olmesartan-hydrochlorothiazide (BENICAR HCT) 40-12.5 MG tablet, Take 1 tablet by mouth daily., Disp: 90 tablet, Rfl: 1   omeprazole (PRILOSEC) 40 MG capsule, Take 1 capsule (40 mg total) by mouth daily., Disp: 90 capsule, Rfl: 1   oxyCODONE (ROXICODONE) 15 MG immediate release tablet, Take 7.5-15 mg by mouth 5 (five) times daily as needed for pain., Disp: , Rfl:  Rimegepant Sulfate (NURTEC) 75 MG TBDP, Take 1 tablet by mouth daily as needed. (Patient not taking: Reported on 09/15/2022), Disp: 18 tablet, Rfl: 5   rosuvastatin (CRESTOR) 40 MG tablet, Take 1 tablet (40 mg total) by mouth daily., Disp: 90 tablet, Rfl: 3  Allergies  Allergen Reactions   Shingrix [Zoster Vac Recomb Adjuvanted]     Rash    Elavil [Amitriptyline Hcl] Rash   Tape Rash    Paper Tape    I personally reviewed active problem list, medication list, allergies, family history, social history, health maintenance with the patient/caregiver today.   ROS  ***  Objective  There were no vitals filed for this visit.  There is no height or weight on file to calculate BMI.  Physical Exam ***  No results found for this or any previous visit (from the past 2160 hour(s)).  Diabetic Foot Exam: Diabetic Foot Exam - Simple   No data filed    ***  PHQ2/9:    09/21/2022   11:25 AM 09/15/2022    7:34 AM 07/30/2022   10:00 AM 04/28/2022    2:29 PM 03/13/2022    7:56 AM  Depression screen PHQ 2/9  Decreased Interest 0 0 0 0 0  Down, Depressed, Hopeless 0 0 0 0 0  PHQ - 2 Score  0 0 0 0 0  Altered sleeping 0 0 0 0 0  Tired, decreased energy 0 0 0 0 0  Change in appetite 0 0 0 0 0  Feeling bad or failure about yourself  0 0 0 0 0  Trouble concentrating 0 0 0 0 0  Moving slowly or fidgety/restless 0 0 0 0 0  Suicidal thoughts 0 0 0 0 0  PHQ-9 Score 0 0 0 0 0  Difficult doing work/chores Not difficult at all        phq 9 is {gen pos [id number]   Fall Risk:    09/21/2022   11:25 AM 09/15/2022    7:34 AM 07/30/2022    9:59 AM 04/28/2022    2:29 PM 03/13/2022    7:56 AM  Fall Risk   Falls in the past year? 0 0 1 0 0  Number falls in past yr: 0 0 0 0 0  Injury with Fall? 0 0 0 0 0  Risk for fall due to : No Fall Risks No Fall Risks History of fall(s) No Fall Risks No Fall Risks  Follow up Falls prevention discussed;Education provided;Falls evaluation completed Falls prevention discussed Falls prevention discussed;Falls evaluation completed;Education provided Falls prevention discussed Falls prevention discussed      Functional Status Survey:      Assessment & Plan  *** There are no diagnoses linked to this encounter.

## 2022-10-30 ENCOUNTER — Ambulatory Visit: Payer: 59 | Admitting: Family Medicine

## 2022-11-03 NOTE — Progress Notes (Signed)
Name: Jimmy Green   MRN: [id number]    DOB: 03/12/1951   Date:11/04/2022       Progress Note  Subjective  Chief Complaint  Follow Up  HPI  HTN: bp is at goal on Benicar HCTZ 40/12.5 mg, metoprolol and Norvasc . He denies chest pain, palpitation or sob  DM Type II : A1C was 8..6 % down  to  7.1 %, 6.7 % and up to  6.9 % , 7.5 % and today is 7 %  , he is on [place] and Metformin since he could not tolerate Rybelsus. He has associated obesity, dyslipidemia, HTN. He is on statin therapy and Benicar . He denies polyphagia, polydipsia or polyuria Reminded him to have yearly eye exam   Depression Major Recurrent: symptoms started when he lost his job in 2016, he is back on Wellbutrin 450  mg and Lexapro 20 mg daily. He tried Seroquel for sleep but kept him up. He states sleep has improved , getting 6-7 hours per night.   CAD with Angina: medically managed, on aspirin beta blocker and statin therapy , no pain at this time , denies SOB. He is under the care of cardiologist. Continue current regiment   History of left internal jugular vein thrombosis: he was seen by vascular surgeon and took aspirin and Eliquis for 3 months, repeat US was clear and he has been off blood thinners since Nov 2022. 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  Denies problems   B12 deficiency: he stopped taking supplementation, advised to resume taking it  RLS: controlled with gabapentin   Headaches with migraine features: usually above right eye, throbbing like and aching,associated with photophobia and phonophobia,  he is on Aimovig once a month and migraine episodes significantly improved , sometimes no episodes in a month.He is seeing neurologist   Obesity: doing better, BMI is now below 35  . He is eating only once a day and smaller portions. Weight has been trending down, he states more active outdoors lately  Chronic pain: left lower back with radiculitis , he goes to pain clinic in [place] and sees [facility], he gets oxycodone 150 tables per month, 15 mg , he has narcan at home . He states usually has a few pills left at the end of the month, the plan is for him to go down to 120 tablets per month. He states celebrex 100 mg once a day has helped control symptoms. Reminded him not to take any other nsaid's but can otc Tylenol   Patient Active Problem List   Diagnosis Date Noted   Chronic left-sided low back pain without sciatica 04/28/2022   History of thrombosis of internal jugular vein 03/13/2022   Recurrent major depression in remission 12/10/2021   Squamous cell carcinoma in situ 12/10/2021   History of DVT in adulthood 03/03/2021   Personal history of colonic polyps    Polyp of transverse colon    BPH with obstruction/lower urinary tract symptoms 05/20/2018   History of basal cell carcinoma 03/23/2018   Primary localized osteoarthritis of left hip 04/06/2017   Dyslipidemia associated with type 2 diabetes mellitus 10/04/2016   Trigger thumb of both thumbs 06/02/2016   Chronic tension-type headache, intractable 09/25/2015   Benign paroxysmal positional vertigo due to bilateral vestibular disorder 09/25/2015   Hearing loss sensory, bilateral 08/26/2015   Cerebral microvascular disease 08/26/2015   Hypertension, benign 03/28/2015   Gastroesophageal reflux disease without esophagitis 03/28/2015   Hyperlipidemia 03/28/2015   CAD  in native artery 03/28/2015   Actinic keratosis 02/14/2015   Obstructive sleep apnea 02/03/2015   Diverticulosis of colon without hemorrhage 01/31/2015   Hx of colonic polyps    Benign neoplasm of descending colon    Benign neoplasm of sigmoid colon    Sebaceous cyst 09/14/2014    Past Surgical History:  Procedure Laterality Date   BACK SURGERY     BASAL CELL CARCINOMA EXCISION Left 12/02/2015   Chest-Done by Dermatologist    BASAL CELL CARCINOMA EXCISION Left 02/21/2018   Nodulocytic pattern, deep margin involved - [facility] - Dr. Brendolyn Patty   BASAL CELL CARCINOMA EXCISION  02/23/2018   CARDIAC CATHETERIZATION Left 04/01/2016   Procedure: Left Heart Cath and Coronary Angiography;  Surgeon: Teodoro Spray, MD;  Location: [place] [facility];  Service: Cardiovascular;  Laterality: Left;   CARDIAC CATHETERIZATION N/A 04/01/2016   Procedure: Intravascular Pressure Wire/FFR Study;  Surgeon: Isaias Cowman, MD;  Location: [place] [facility];  Service: Cardiovascular;  Laterality: N/A;   COLONOSCOPY N/A 01/15/2015   Wohl-ileitis, 2 benign polyps, cryptitis, sigmoid diverticulosis, focal ulceration ICV   COLONOSCOPY WITH PROPOFOL N/A 08/29/2020   Procedure: COLONOSCOPY WITH PROPOFOL;  Surgeon: Lucilla Lame, MD;  Location: [facility] ENDOSCOPY;  Service: Endoscopy;  Laterality: N/A;   CORONARY PRESSURE/FFR STUDY N/A 09/09/2021   Procedure: INTRAVASCULAR PRESSURE WIRE/FFR STUDY;  Surgeon: Andrez Grime, MD;  Location: [place] [facility];  Service: Cardiovascular;  Laterality: N/A;   CORONARY STENT PLACEMENT     FRACTIONAL FLOW RESERVE WIRE  10/08/2011   Procedure: FRACTIONAL FLOW RESERVE WIRE;  Surgeon: Clent Demark, MD;  Location: [place] CATH LAB;  Service: Cardiovascular;;   HERNIA REPAIR     KNEE SURGERY     LEFT HEART CATH AND CORONARY ANGIOGRAPHY N/A 09/09/2021   Procedure: LEFT HEART CATH AND CORONARY ANGIOGRAPHY;  Surgeon: Andrez Grime, MD;  Location: [place] [facility];  Service: Cardiovascular;  Laterality: N/A;   LEFT HEART CATHETERIZATION WITH CORONARY ANGIOGRAM N/A 10/08/2011   Procedure: LEFT HEART CATHETERIZATION WITH CORONARY ANGIOGRAM;  Surgeon: Clent Demark, MD;  Location: [place] CATH LAB;  Service: Cardiovascular;  Laterality: N/A;   parodectomy Left 05/07/2020   SKIN CANCER EXCISION  01/2018   [facility]   TOTAL HIP ARTHROPLASTY Left 04/06/2017   Procedure: TOTAL HIP ARTHROPLASTY ANTERIOR APPROACH;  Surgeon: Hessie Knows, MD;  Location: [facility] ORS;  Service: Orthopedics;  Laterality: Left;     Family History  Problem Relation Age of Onset   Lung cancer Father    Heart disease Father    Skin cancer Father     Social History   Tobacco Use   Smoking status: Former    Years: 50    Types: Cigarettes    Quit date: 08/03/2012    Years since quitting: 10.2   Smokeless tobacco: Never  Substance Use Topics   Alcohol use: No    Alcohol/week: 0.0 standard drinks of alcohol     Current Outpatient Medications:    amLODipine (NORVASC) 2.5 MG tablet, Take 1 tablet (2.5 mg total) by mouth daily., Disp: 90 tablet, Rfl: 1   aspirin EC 81 MG tablet, Take 81 mg by mouth every morning. , Disp: , Rfl:    buPROPion (WELLBUTRIN XL) 150 MG 24 hr tablet, Take 1 tablet (150 mg total) by mouth See admin instructions. Take with 300 mg for a total of 450 mg daily, Disp: 90 tablet, Rfl: 1   buPROPion (WELLBUTRIN XL)  300 MG 24 hr tablet, Take 1 tablet (300 mg total) by mouth See admin instructions. Take with 150 mg for a total of 450 mg daily, Disp: 90 tablet, Rfl: 1   dapagliflozin propanediol (FARXIGA) 10 MG TABS tablet, Take 1 tablet (10 mg total) by mouth daily before breakfast., Disp: 90 tablet, Rfl: 1   diclofenac Sodium (VOLTAREN) 1 % GEL, , Disp: , Rfl:    Erenumab-aooe (AIMOVIG) 140 MG/ML SOAJ, Inject 140 mg into the skin every 30 (thirty) days., Disp: 1.12 mL, Rfl: 5   escitalopram (LEXAPRO) 20 MG tablet, Take 1 tablet (20 mg total) by mouth daily., Disp: 90 tablet, Rfl: 1   gabapentin (NEURONTIN) 400 MG capsule, Take 400 mg by mouth 4 (four) times daily., Disp: , Rfl:    metFORMIN (GLUCOPHAGE-XR) 750 MG 24 hr tablet, Take 2 tablets (1,500 mg total) by mouth daily with breakfast., Disp: 180 tablet, Rfl: 1   metoprolol succinate (TOPROL-XL) 50 MG 24 hr tablet, Take 1 tablet (50 mg total) by mouth every evening. Take with or immediately following a meal., Disp: 90 tablet, Rfl: 1   metroNIDAZOLE (METROGEL) 0.75 % gel, Apply to face 1-2 times a day for roscea., Disp: 45 g, Rfl: 5   NARCAN 4  MG/0.1ML LIQD nasal spray kit, 0.4 mg once., Disp: , Rfl: 0   nitroGLYCERIN (NITROSTAT) 0.4 MG SL tablet, DISSOLVE 1 TABLET UNDER THE TONGUE EVERY 5 MINUTES FOR UP TO 3 DOSES AS NEEDED FOR CHEST PAIN. IF NO RELIEF AFTER 3 DOSES, CALL 911 OR GO TO ER., Disp: 25 tablet, Rfl: 0   omeprazole (PRILOSEC) 40 MG capsule, Take 1 capsule (40 mg total) by mouth daily., Disp: 90 capsule, Rfl: 1   oxyCODONE (ROXICODONE) 15 MG immediate release tablet, Take 7.5-15 mg by mouth 5 (five) times daily as needed for pain., Disp: , Rfl:    rosuvastatin (CRESTOR) 40 MG tablet, Take 1 tablet (40 mg total) by mouth daily., Disp: 90 tablet, Rfl: 3   celecoxib (CELEBREX) 100 MG capsule, Take 1 capsule (100 mg total) by mouth daily. Do not take ibuprofen or any other nsaid's with this, Disp: 90 capsule, Rfl: 1   olmesartan-hydrochlorothiazide (BENICAR HCT) 40-12.5 MG tablet, Take 1 tablet by mouth daily. (Patient not taking: Reported on 11/04/2022), Disp: 90 tablet, Rfl: 1   Rimegepant Sulfate (NURTEC) 75 MG TBDP, Take 1 tablet by mouth daily as needed. (Patient not taking: Reported on 11/04/2022), Disp: 18 tablet, Rfl: 5  Allergies  Allergen Reactions   Shingrix [Zoster Vac Recomb Adjuvanted]     Rash    Elavil [Amitriptyline Hcl] Rash   Tape Rash    Paper Tape    I personally reviewed active problem list, medication list, allergies, family history, social history, health maintenance with the patient/caregiver today.   ROS  Ten systems reviewed and is negative except as mentioned in HPI   Objective  Vitals:   11/04/22 0833  BP: 132/82  Pulse: 76  Resp: 16  Temp: 97.9 F (36.6 C)  TempSrc: Oral  SpO2: 93%  Weight: 223 lb 4.8 oz (101.3 kg)  Height: 5\' 9"  (1.753 m)    Body mass index is 32.98 kg/m.  Physical Exam  Constitutional: Patient appears well-developed and well-nourished. Obese  No distress.  HEENT: head atraumatic, normocephalic, pupils equal and reactive to light, neck supple Cardiovascular:  Normal rate, regular rhythm and normal heart sounds.  No murmur heard. No BLE edema. Pulmonary/Chest: Effort normal and breath sounds normal. No respiratory distress.  Abdominal: Soft.  There is no tenderness. Psychiatric: Patient has a normal mood and affect. behavior is normal. Judgment and thought content normal.   Recent Results (from the past 2160 hour(s))  POCT HgB A1C     Status: Abnormal   Collection Time: 11/04/22  8:34 AM  Result Value Ref Range   Hemoglobin A1C 7.0 (A) 4.0 - 5.6 %   HbA1c POC (<> result, manual entry)     HbA1c, POC (prediabetic range)     HbA1c, POC (controlled diabetic range)      Diabetic Foot Exam: Diabetic Foot Exam - Simple   Simple Foot Form Visual Inspection No deformities, no ulcerations, no other skin breakdown bilaterally: Yes Sensation Testing Intact to touch and monofilament testing bilaterally: Yes Pulse Check Posterior Tibialis and Dorsalis pulse intact bilaterally: Yes Comments      PHQ2/9:    11/04/2022    8:31 AM 09/21/2022   11:25 AM 09/15/2022    7:34 AM 07/30/2022   10:00 AM 04/28/2022    2:29 PM  Depression screen PHQ 2/9  Decreased Interest 0 0 0 0 0  Down, Depressed, Hopeless 0 0 0 0 0  PHQ - 2 Score 0 0 0 0 0  Altered sleeping 0 0 0 0 0  Tired, decreased energy 0 0 0 0 0  Change in appetite 0 0 0 0 0  Feeling bad or failure about yourself  0 0 0 0 0  Trouble concentrating 0 0 0 0 0  Moving slowly or fidgety/restless 0 0 0 0 0  Suicidal thoughts 0 0 0 0 0  PHQ-9 Score 0 0 0 0 0  Difficult doing work/chores  Not difficult at all       phq 9 is negative   Fall Risk:    11/04/2022    8:30 AM 09/21/2022   11:25 AM 09/15/2022    7:34 AM 07/30/2022    9:59 AM 04/28/2022    2:29 PM  Fall Risk   Falls in the past year? 1 0 0 1 0  Number falls in past yr: 0 0 0 0 0  Injury with Fall? 0 0 0 0 0  Risk for fall due to : History of fall(s) No Fall Risks No Fall Risks History of fall(s) No Fall Risks  Follow up Falls  prevention discussed;Education provided;Falls evaluation completed Falls prevention discussed;Education provided;Falls evaluation completed Falls prevention discussed Falls prevention discussed;Falls evaluation completed;Education provided Falls prevention discussed      Functional Status Survey: Is the patient deaf or have difficulty hearing?: Yes Does the patient have difficulty seeing, even when wearing glasses/contacts?: No Does the patient have difficulty concentrating, remembering, or making decisions?: Yes Does the patient have difficulty walking or climbing stairs?: Yes Does the patient have difficulty dressing or bathing?: No Does the patient have difficulty doing errands alone such as visiting a doctor's office or shopping?: No    Assessment & Plan  1. Dyslipidemia associated with type 2 diabetes mellitus  - POCT HgB A1C - HM Diabetes Foot Exam  2. Hypertension associated with diabetes  Bp is ta goal   3. History of thrombosis of internal jugular vein  Resolved   4. RLS (restless legs syndrome)  Controlled   5. Migraine without aura and without status migrainosus, not intractable  Doing well   6. B12 deficiency  Resume supplementation   7. Chronic, continuous use of opioids  Continue follow up with pain clinic   8. Recurrent major depression in  remission  Doing well    9. Skin lesion  - Ambulatory referral to Dermatology

## 2022-11-04 ENCOUNTER — Encounter: Payer: Self-pay | Admitting: Family Medicine

## 2022-11-04 ENCOUNTER — Ambulatory Visit (INDEPENDENT_AMBULATORY_CARE_PROVIDER_SITE_OTHER): Payer: 59 | Admitting: Family Medicine

## 2022-11-04 VITALS — BP 132/82 | HR 76 | Temp 97.9°F | Resp 16 | Ht 69.0 in | Wt 223.3 lb

## 2022-11-04 DIAGNOSIS — G43009 Migraine without aura, not intractable, without status migrainosus: Secondary | ICD-10-CM

## 2022-11-04 DIAGNOSIS — E785 Hyperlipidemia, unspecified: Secondary | ICD-10-CM

## 2022-11-04 DIAGNOSIS — E1169 Type 2 diabetes mellitus with other specified complication: Secondary | ICD-10-CM | POA: Diagnosis not present

## 2022-11-04 DIAGNOSIS — F3340 Major depressive disorder, recurrent, in remission, unspecified: Secondary | ICD-10-CM

## 2022-11-04 DIAGNOSIS — E538 Deficiency of other specified B group vitamins: Secondary | ICD-10-CM | POA: Diagnosis not present

## 2022-11-04 DIAGNOSIS — F1190 Opioid use, unspecified, uncomplicated: Secondary | ICD-10-CM

## 2022-11-04 DIAGNOSIS — Z86718 Personal history of other venous thrombosis and embolism: Secondary | ICD-10-CM

## 2022-11-04 DIAGNOSIS — E1159 Type 2 diabetes mellitus with other circulatory complications: Secondary | ICD-10-CM | POA: Diagnosis not present

## 2022-11-04 DIAGNOSIS — G2581 Restless legs syndrome: Secondary | ICD-10-CM | POA: Diagnosis not present

## 2022-11-04 DIAGNOSIS — I152 Hypertension secondary to endocrine disorders: Secondary | ICD-10-CM | POA: Diagnosis not present

## 2022-11-04 DIAGNOSIS — L989 Disorder of the skin and subcutaneous tissue, unspecified: Secondary | ICD-10-CM

## 2022-11-04 LAB — POCT GLYCOSYLATED HEMOGLOBIN (HGB A1C): Hemoglobin A1C: 7 % — AB (ref 4.0–5.6)

## 2022-11-04 MED ORDER — CELECOXIB 100 MG PO CAPS: 100.0000 mg | ORAL_CAPSULE | Freq: Every day | ORAL | 1 refills | Status: DC

## 2022-11-09 ENCOUNTER — Ambulatory Visit: Payer: Self-pay | Admitting: *Deleted

## 2022-11-09 NOTE — Telephone Encounter (Signed)
Called and informed wife he can take B12 SL daily. She gave verbal understanding.

## 2022-11-09 NOTE — Telephone Encounter (Signed)
Message from Glean Salen sent at 11/09/2022 10:19 AM EDT  Summary: med ?   Patient's wife called in asking about OTC vitamin that Dr Carlynn Purl told him to take , since he stopped taking B12 shots, but he can't remember what kind it was          Call History   Type Contact Phone/Fax User  11/09/2022 10:18 AM EDT Phone (Incoming) Sherle Poe (Emergency Contact) [phone] [place], Deedra Ehrich   Reason for Disposition  [1] Caller has URGENT medicine question about med that PCP or specialist prescribed AND [2] triager unable to answer question  Answer Assessment - Initial Assessment Questions 1. NAME of MEDICINE: "What medicine(s) are you calling about?"     Returned call to Sherle Poe, wife.    Dr. Carlynn Purl told him to take an OTC supplement but he can't remember what she told him to take.   He used to take B12 shots but she stopped those and he thinks it might have been a B12 supplement you can get OTC but he's not sure. 2. QUESTION: "What is your question?" (e.g., double dose of medicine, side effect)     What OTC supplement does Dr. Carlynn Purl want him to take? 3. PRESCRIBER: "Who prescribed the medicine?" Reason: if prescribed by specialist, call should be referred to that group.     Dr. Carlynn Purl 4. SYMPTOMS: "Do you have any symptoms?" If Yes, ask: "What symptoms are you having?"  "How bad are the symptoms (e.g., mild, moderate, severe)     N/A 5. PREGNANCY:  "Is there any chance that you are pregnant?" "When was your last menstrual period?"     N/A  Protocols used: Medication Question Call-A-AH  Chief Complaint: He forgot what OTC supplement Dr. Carlynn Purl told him to take.   He used to be on B12 shots but she stopped those and he thinks it might have been a B12 supplement you can get OTC but wasn't sure. Symptoms:  Frequency:  Pertinent Negatives: Patient denies  Disposition: [] ED /[] Urgent Care (no appt availability in office) / [] Appointment(In office/virtual)/ []  [facility]/ [] Home Care/ [] Refused Recommended Disposition /[] [place] [facility]/ [x]  Follow-up with PCP Additional Notes: Message sent to Dr Carlynn Purl.   Pt's wife Roper Hoeg agreeable to somone calling her back.   If she is not home she said it was ok to leave the information on the answering machine.

## 2022-11-24 DIAGNOSIS — M5136 Other intervertebral disc degeneration, lumbar region: Secondary | ICD-10-CM | POA: Diagnosis not present

## 2022-11-24 DIAGNOSIS — G2581 Restless legs syndrome: Secondary | ICD-10-CM | POA: Diagnosis not present

## 2022-11-24 DIAGNOSIS — R202 Paresthesia of skin: Secondary | ICD-10-CM | POA: Diagnosis not present

## 2022-11-25 ENCOUNTER — Encounter: Payer: Self-pay | Admitting: Dermatology

## 2022-11-25 ENCOUNTER — Ambulatory Visit (INDEPENDENT_AMBULATORY_CARE_PROVIDER_SITE_OTHER): Payer: 59 | Admitting: Dermatology

## 2022-11-25 VITALS — BP 137/76 | HR 66

## 2022-11-25 DIAGNOSIS — L219 Seborrheic dermatitis, unspecified: Secondary | ICD-10-CM

## 2022-11-25 DIAGNOSIS — L570 Actinic keratosis: Secondary | ICD-10-CM

## 2022-11-25 DIAGNOSIS — L821 Other seborrheic keratosis: Secondary | ICD-10-CM

## 2022-11-25 DIAGNOSIS — D492 Neoplasm of unspecified behavior of bone, soft tissue, and skin: Secondary | ICD-10-CM

## 2022-11-25 DIAGNOSIS — D229 Melanocytic nevi, unspecified: Secondary | ICD-10-CM

## 2022-11-25 DIAGNOSIS — Z85828 Personal history of other malignant neoplasm of skin: Secondary | ICD-10-CM

## 2022-11-25 DIAGNOSIS — L738 Other specified follicular disorders: Secondary | ICD-10-CM | POA: Diagnosis not present

## 2022-11-25 DIAGNOSIS — L814 Other melanin hyperpigmentation: Secondary | ICD-10-CM | POA: Diagnosis not present

## 2022-11-25 DIAGNOSIS — L820 Inflamed seborrheic keratosis: Secondary | ICD-10-CM

## 2022-11-25 DIAGNOSIS — Z1283 Encounter for screening for malignant neoplasm of skin: Secondary | ICD-10-CM

## 2022-11-25 DIAGNOSIS — D1801 Hemangioma of skin and subcutaneous tissue: Secondary | ICD-10-CM

## 2022-11-25 DIAGNOSIS — D485 Neoplasm of uncertain behavior of skin: Secondary | ICD-10-CM

## 2022-11-25 DIAGNOSIS — L578 Other skin changes due to chronic exposure to nonionizing radiation: Secondary | ICD-10-CM

## 2022-11-25 HISTORY — DX: Actinic keratosis: L57.0

## 2022-11-25 MED ORDER — KETOCONAZOLE 2 % EX CREA: TOPICAL_CREAM | CUTANEOUS | 2 refills | Status: AC

## 2022-11-25 NOTE — Patient Instructions (Addendum)
Cryotherapy Aftercare  Wash gently with soap and water everyday.   Apply Vaseline daily until healed.    Recommend daily broad spectrum sunscreen SPF 30+ to sun-exposed areas, reapply every 2 hours as needed. Call for new or changing lesions.  Staying in the shade or wearing long sleeves, sun glasses (UVA+UVB protection) and wide brim hats (4-inch brim around the entire circumference of the hat) are also recommended for sun protection.     Melanoma ABCDEs  Melanoma is the most dangerous type of skin cancer, and is the leading cause of death from skin disease.  You are more likely to develop melanoma if you: Have light-colored skin, light-colored eyes, or red or blond hair Spend a lot of time in the sun Tan regularly, either outdoors or in a tanning bed Have had blistering sunburns, especially during childhood Have a close family member who has had a melanoma Have atypical moles or large birthmarks  Early detection of melanoma is key since treatment is typically straightforward and cure rates are extremely high if we catch it early.   The first sign of melanoma is often a change in a mole or a new dark spot.  The ABCDE system is a way of remembering the signs of melanoma.  A for asymmetry:  The two halves do not match. B for border:  The edges of the growth are irregular. C for color:  A mixture of colors are present instead of an even brown color. D for diameter:  Melanomas are usually (but not always) greater than 6mm - the size of a pencil eraser. E for evolution:  The spot keeps changing in size, shape, and color.  Please check your skin once per month between visits. You can use a small mirror in front and a large mirror behind you to keep an eye on the back side or your body.   If you see any new or changing lesions before your next follow-up, please call to schedule a visit.  Please continue daily skin protection including broad spectrum sunscreen SPF 30+ to sun-exposed  areas, reapplying every 2 hours as needed when you're outdoors.   Staying in the shade or wearing long sleeves, sun glasses (UVA+UVB protection) and wide brim hats (4-inch brim around the entire circumference of the hat) are also recommended for sun protection.     Due to recent changes in healthcare laws, you may see results of your pathology and/or laboratory studies on MyChart before the doctors have had a chance to review them. We understand that in some cases there may be results that are confusing or concerning to you. Please understand that not all results are received at the same time and often the doctors may need to interpret multiple results in order to provide you with the best plan of care or course of treatment. Therefore, we ask that you please give us 2 business days to thoroughly review all your results before contacting the office for clarification. Should we see a critical lab result, you will be contacted sooner.   If You Need Anything After Your Visit  If you have any questions or concerns for your doctor, please call our main line at [phone] and press option 4 to reach your doctor's medical assistant. If no one answers, please leave a voicemail as directed and we will return your call as soon as possible. Messages left after 4 pm will be answered the following business day.   You may also send us a message via   MyChart. We typically respond to MyChart messages within 1-2 business days.  For prescription refills, please ask your pharmacy to contact our office. Our fax number is [phone].  If you have an urgent issue when the clinic is closed that cannot wait until the next business day, you can page your doctor at the number below.    Please note that while we do our best to be available for urgent issues outside of office hours, we are not available 24/7.   If you have an urgent issue and are unable to reach us, you may choose to seek medical care at your doctor's  office, retail clinic, urgent care center, or emergency room.  If you have a medical emergency, please immediately call 911 or go to the emergency department.  Pager Numbers  - Dr. Kowalski: [phone]  - Dr. Moye: [phone]  - Dr. Stewart: [phone]  In the event of inclement weather, please call our main line at [phone] for an update on the status of any delays or closures.  Dermatology Medication Tips: Please keep the boxes that topical medications come in in order to help keep track of the instructions about where and how to use these. Pharmacies typically print the medication instructions only on the boxes and not directly on the medication tubes.   If your medication is too expensive, please contact our office at [phone] option 4 or send us a message through MyChart.   We are unable to tell what your co-pay for medications will be in advance as this is different depending on your insurance coverage. However, we may be able to find a substitute medication at lower cost or fill out paperwork to get insurance to cover a needed medication.   If a prior authorization is required to get your medication covered by your insurance company, please allow us 1-2 business days to complete this process.  Drug prices often vary depending on where the prescription is filled and some pharmacies may offer cheaper prices.  The website www.goodrx.com contains coupons for medications through different pharmacies. The prices here do not account for what the cost may be with help from insurance (it may be cheaper with your insurance), but the website can give you the price if you did not use any insurance.  - You can print the associated coupon and take it with your prescription to the pharmacy.  - You may also stop by our office during regular business hours and pick up a GoodRx coupon card.  - If you need your prescription sent electronically to a different pharmacy, notify our office  through [facility] MyChart or by phone at [phone] option 4.     Si Usted Necesita Algo Despus de Su Visita  Tambin puede enviarnos un mensaje a travs de MyChart. Por lo general respondemos a los mensajes de MyChart en el transcurso de 1 a 2 das hbiles.  Para renovar recetas, por favor pida a su farmacia que se ponga en contacto con nuestra oficina. Nuestro nmero de fax es el [phone].  Si tiene un asunto urgente cuando la clnica est cerrada y que no puede esperar hasta el siguiente da hbil, puede llamar/localizar a su doctor(a) al nmero que aparece a continuacin.   Por favor, tenga en cuenta que aunque hacemos todo lo posible para estar disponibles para asuntos urgentes fuera del horario de oficina, no estamos disponibles las 24 horas del da, los 7 das de la semana.   Si tiene un problema urgente y no   puede comunicarse con nosotros, puede optar por buscar atencin mdica  en el consultorio de su doctor(a), en una clnica privada, en un centro de atencin urgente o en una sala de emergencias.  Si tiene una emergencia mdica, por favor llame inmediatamente al 911 o vaya a la sala de emergencias.  Nmeros de bper  - Dr. Kowalski: [phone]  - Dra. Moye: [phone]  - Dra. Stewart: [phone]  En caso de inclemencias del tiempo, por favor llame a nuestra lnea principal al [phone] para una actualizacin sobre el estado de cualquier retraso o cierre.  Consejos para la medicacin en dermatologa: Por favor, guarde las cajas en las que vienen los medicamentos de uso tpico para ayudarle a seguir las instrucciones sobre dnde y cmo usarlos. Las farmacias generalmente imprimen las instrucciones del medicamento slo en las cajas y no directamente en los tubos del medicamento.   Si su medicamento es muy caro, por favor, pngase en contacto con nuestra oficina llamando al [phone] y presione la opcin 4 o envenos un mensaje a travs de MyChart.   No  podemos decirle cul ser su copago por los medicamentos por adelantado ya que esto es diferente dependiendo de la cobertura de su seguro. Sin embargo, es posible que podamos encontrar un medicamento sustituto a menor costo o llenar un formulario para que el seguro cubra el medicamento que se considera necesario.   Si se requiere una autorizacin previa para que su compaa de seguros cubra su medicamento, por favor permtanos de 1 a 2 das hbiles para completar este proceso.  Los precios de los medicamentos varan con frecuencia dependiendo del lugar de dnde se surte la receta y alguna farmacias pueden ofrecer precios ms baratos.  El sitio web www.goodrx.com tiene cupones para medicamentos de diferentes farmacias. Los precios aqu no tienen en cuenta lo que podra costar con la ayuda del seguro (puede ser ms barato con su seguro), pero el sitio web puede darle el precio si no utiliz ningn seguro.  - Puede imprimir el cupn correspondiente y llevarlo con su receta a la farmacia.  - Tambin puede pasar por nuestra oficina durante el horario de atencin regular y recoger una tarjeta de cupones de GoodRx.  - Si necesita que su receta se enve electrnicamente a una farmacia diferente, informe a nuestra oficina a travs de MyChart de [place] o por telfono llamando al [phone] y presione la opcin 4.    

## 2022-11-25 NOTE — Progress Notes (Signed)
Follow-Up Visit   Subjective  Jimmy Green is a 72 y.o. male who presents for the following: Skin Cancer Screening and Upper Body Skin Exam. Hx of BCCs. 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 Bx 06/17/2021. Used 5FU/Calcipotriene cream as directed in early 2023. Area scabbed up, but spot is still there.  The patient presents for Upper Body Skin Exam (UBSE) for skin cancer screening and mole check. The patient has spots, moles and lesions to be evaluated, some may be new or changing and the patient has concerns that these could be cancer.    The following portions of the chart were reviewed this encounter and updated as appropriate: medications, allergies, medical history  Review of Systems:  No other skin or systemic complaints except as noted in HPI or Assessment and Plan.  Objective  Well appearing patient in no apparent distress; mood and affect are within normal limits.  All skin waist up examined. Relevant physical exam findings are noted in the Assessment and Plan.  Left Lower Lip 0.5 cm firm pink-yellow smooth papule at left lower lip at Bx site       Assessment & Plan   HISTORY OF BASAL CELL CARCINOMA OF THE SKIN. Several sites, see history - No evidence of recurrence today - Recommend regular full body skin exams - Recommend daily broad spectrum sunscreen SPF 30+ to sun-exposed areas, reapply every 2 hours as needed.  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 Recurrence vs scar tissue 0.5 cm firm pink-yellow smooth papule at left lower lip at Bx site. Biopsy performed today to R/O recurrence.  - Recommend regular full body skin exams - Recommend daily broad spectrum sunscreen SPF 30+ to sun-exposed areas, reapply every 2 hours as needed.  - Call if any new or changing lesions are noted between office visits    Neoplasm of skin Left Lower Lip  Skin / nail biopsy Type of  biopsy: tangential   Informed consent: discussed and consent obtained   Anesthesia: the lesion was anesthetized in a standard fashion   Anesthesia comment:  Area prepped with alcohol Anesthetic:  1% lidocaine w/ epinephrine 1-100,000 buffered w/ 8.4% NaHCO3 Instrument used: flexible razor blade   Hemostasis achieved with: pressure, aluminum chloride and electrodesiccation   Outcome: patient tolerated procedure well   Post-procedure details: wound care instructions given   Post-procedure details comment:  Ointment and small bandage applied  Specimen 1 - Surgical pathology Differential Diagnosis: Scar vs recurrent SCCis  Check Margins: No  Previous Bx: [id number]   Lentigines, Seborrheic Keratoses, Hemangiomas - Benign normal skin lesions - Benign-appearing - Call for any changes  Melanocytic Nevi - Tan-brown and/or pink-flesh-colored symmetric macules and papules - Benign appearing on exam today - Observation - Call clinic for new or changing moles - Recommend daily use of broad spectrum spf 30+ sunscreen to sun-exposed areas.   Actinic Damage - Chronic condition, secondary to cumulative UV/sun exposure - diffuse scaly erythematous macules with underlying dyspigmentation - Recommend daily broad spectrum sunscreen SPF 30+ to sun-exposed areas, reapply every 2 hours as needed.  - Staying in the shade or wearing long sleeves, sun glasses (UVA+UVB protection) and wide brim hats (4-inch brim around the entire circumference of the hat) are also recommended for sun protection.  - Call for new or changing lesions.  Sebaceous Hyperplasia - Small yellow papules with a central dell - Benign-appearing -  Observe. Call for changes.   Skin cancer screening performed today.  ACTINIC KERATOSIS Exam:pink scaly macule, erythema at edge of BCC scar at left spinal mid back  Actinic keratoses are precancerous spots that appear secondary to cumulative UV radiation exposure/sun exposure over  time. They are chronic with expected duration over 1 year. A portion of actinic keratoses will progress to squamous cell carcinoma of the skin. It is not possible to reliably predict which spots will progress to skin cancer and so treatment is recommended to prevent development of skin cancer.  Recommend daily broad spectrum sunscreen SPF 30+ to sun-exposed areas, reapply every 2 hours as needed.  Recommend staying in the shade or wearing long sleeves, sun glasses (UVA+UVB protection) and wide brim hats (4-inch brim around the entire circumference of the hat). Call for new or changing lesions.  Treatment Plan:  Prior to procedure, discussed risks of blister formation, small wound, skin dyspigmentation, or rare scar following cryotherapy. Recommend Vaseline ointment to treated areas while healing.  Destruction Procedure Note Destruction method: cryotherapy   Informed consent: discussed and consent obtained   Lesion destroyed using liquid nitrogen: Yes   Outcome: patient tolerated procedure well with no complications   Post-procedure details: wound care instructions given   Locations: right antihelix x1,  left spinal mid back x1 (recheck on follow up) # of Lesions Treated: 2   INFLAMED SEBORRHEIC KERATOSIS Exam: Erythematous keratotic or waxy stuck-on papule L hand, L arm, L neck  Symptomatic, irritating, patient would like treated.  Benign-appearing.  Call clinic for new or changing lesions.   Prior to procedure, discussed risks of blister formation, small wound, skin dyspigmentation, or rare scar following treatment. Recommend Vaseline ointment to treated areas while healing.  Destruction Procedure Note Destruction method: cryotherapy   Informed consent: discussed and consent obtained   Lesion destroyed using liquid nitrogen: Yes   Outcome: patient tolerated procedure well with no complications   Post-procedure details: wound care instructions given   Locations: left hand dorsum x1,  left posterior upper arm x1, left neck x1 # of Lesions Treated: 3  SEBORRHEIC DERMATITIS Exam: Pink patches with greasy scale at face/neck  Chronic and persistent condition with duration or expected duration over one year. Condition is bothersome/symptomatic for patient. Currently flared.   Seborrheic Dermatitis is a chronic persistent rash characterized by pinkness and scaling most commonly of the mid face but also can occur on the scalp (dandruff), ears; mid chest, mid back and groin.  It tends to be exacerbated by stress and cooler weather.  People who have neurologic disease may experience new onset or exacerbation of existing seborrheic dermatitis.  The condition is not curable but treatable and can be controlled.  Treatment Plan: Apply ketoconazole cream once to twice a day to aas face as needed for scaly rash  Return in about 6 months (around 05/27/2023) for AK Follow Up, Biopsy Follow Up.  I, Lawson Radar, CMA, am acting as scribe for Willeen Niece, MD.   Documentation: I have reviewed the above documentation for accuracy and completeness, and I agree with the above.  Willeen Niece, MD

## 2022-12-01 ENCOUNTER — Telehealth: Payer: Self-pay

## 2022-12-01 NOTE — Telephone Encounter (Signed)
-----   Message from Tara Stewart, MD sent at 11/30/2022  6:46 PM EDT ----- Skin , left lower lip ACTINIC KERATOSIS, PROBABLE EARLY EVOLVING LESION  Early precancer, needs cryotherapy if it recurs - please call patient 

## 2022-12-01 NOTE — Telephone Encounter (Signed)
Left message for patient to call for bx results./sh 

## 2022-12-01 NOTE — Telephone Encounter (Signed)
Discussed biopsy results with patient 

## 2022-12-01 NOTE — Telephone Encounter (Signed)
-----   Message from Willeen Niece, MD sent at 11/30/2022  6:46 PM EDT ----- Skin , left lower lip ACTINIC KERATOSIS, PROBABLE EARLY EVOLVING LESION  Early precancer, needs cryotherapy if it recurs - please call patient

## 2022-12-07 ENCOUNTER — Telehealth: Payer: Self-pay | Admitting: Family Medicine

## 2022-12-07 NOTE — Telephone Encounter (Signed)
Called patient to schedule Medicare Annual Wellness Visit (AWV). Left message for patient to call back and schedule Medicare Annual Wellness Visit (AWV).  Last date of AWV: 12/02/2021  Please schedule an appointment at any time with [facility].  If any questions, please contact me at [phone].  Thank you ,  Randon Goldsmith Care Guide [facility] AWV TEAM Direct Dial: [phone]

## 2022-12-22 DIAGNOSIS — Z79891 Long term (current) use of opiate analgesic: Secondary | ICD-10-CM | POA: Diagnosis not present

## 2022-12-22 DIAGNOSIS — G2581 Restless legs syndrome: Secondary | ICD-10-CM | POA: Diagnosis not present

## 2022-12-22 DIAGNOSIS — M5136 Other intervertebral disc degeneration, lumbar region: Secondary | ICD-10-CM | POA: Diagnosis not present

## 2022-12-22 DIAGNOSIS — Z5181 Encounter for therapeutic drug level monitoring: Secondary | ICD-10-CM | POA: Diagnosis not present

## 2022-12-22 DIAGNOSIS — R202 Paresthesia of skin: Secondary | ICD-10-CM | POA: Diagnosis not present

## 2022-12-22 DIAGNOSIS — G894 Chronic pain syndrome: Secondary | ICD-10-CM | POA: Diagnosis not present

## 2023-01-19 DIAGNOSIS — R202 Paresthesia of skin: Secondary | ICD-10-CM | POA: Diagnosis not present

## 2023-01-19 DIAGNOSIS — M5136 Other intervertebral disc degeneration, lumbar region: Secondary | ICD-10-CM | POA: Diagnosis not present

## 2023-01-19 DIAGNOSIS — G2581 Restless legs syndrome: Secondary | ICD-10-CM | POA: Diagnosis not present

## 2023-02-09 DIAGNOSIS — E785 Hyperlipidemia, unspecified: Secondary | ICD-10-CM | POA: Diagnosis not present

## 2023-02-09 DIAGNOSIS — N189 Chronic kidney disease, unspecified: Secondary | ICD-10-CM | POA: Diagnosis not present

## 2023-02-09 DIAGNOSIS — E119 Type 2 diabetes mellitus without complications: Secondary | ICD-10-CM | POA: Diagnosis not present

## 2023-02-09 DIAGNOSIS — I2510 Atherosclerotic heart disease of native coronary artery without angina pectoris: Secondary | ICD-10-CM | POA: Diagnosis not present

## 2023-02-09 DIAGNOSIS — G4733 Obstructive sleep apnea (adult) (pediatric): Secondary | ICD-10-CM | POA: Diagnosis not present

## 2023-02-09 DIAGNOSIS — H8113 Benign paroxysmal vertigo, bilateral: Secondary | ICD-10-CM | POA: Diagnosis not present

## 2023-02-09 DIAGNOSIS — E1169 Type 2 diabetes mellitus with other specified complication: Secondary | ICD-10-CM | POA: Diagnosis not present

## 2023-02-09 DIAGNOSIS — I82C12 Acute embolism and thrombosis of left internal jugular vein: Secondary | ICD-10-CM | POA: Diagnosis not present

## 2023-02-09 DIAGNOSIS — E782 Mixed hyperlipidemia: Secondary | ICD-10-CM | POA: Diagnosis not present

## 2023-02-10 ENCOUNTER — Ambulatory Visit: Payer: 59 | Admitting: Family Medicine

## 2023-02-10 DIAGNOSIS — M5136 Other intervertebral disc degeneration, lumbar region: Secondary | ICD-10-CM | POA: Diagnosis not present

## 2023-02-10 DIAGNOSIS — G8929 Other chronic pain: Secondary | ICD-10-CM | POA: Diagnosis not present

## 2023-02-10 DIAGNOSIS — G2581 Restless legs syndrome: Secondary | ICD-10-CM | POA: Diagnosis not present

## 2023-03-16 DIAGNOSIS — M5136 Other intervertebral disc degeneration, lumbar region: Secondary | ICD-10-CM | POA: Diagnosis not present

## 2023-03-16 DIAGNOSIS — M169 Osteoarthritis of hip, unspecified: Secondary | ICD-10-CM | POA: Diagnosis not present

## 2023-03-16 DIAGNOSIS — R202 Paresthesia of skin: Secondary | ICD-10-CM | POA: Diagnosis not present

## 2023-03-16 DIAGNOSIS — G2581 Restless legs syndrome: Secondary | ICD-10-CM | POA: Diagnosis not present

## 2023-03-25 ENCOUNTER — Ambulatory Visit (INDEPENDENT_AMBULATORY_CARE_PROVIDER_SITE_OTHER): Payer: 59

## 2023-03-25 VITALS — Ht 69.0 in | Wt 223.0 lb

## 2023-03-25 DIAGNOSIS — Z0000 Encounter for general adult medical examination without abnormal findings: Secondary | ICD-10-CM | POA: Diagnosis not present

## 2023-03-25 NOTE — Progress Notes (Signed)
Subjective:   Jimmy Green is a 72 y.o. male who presents for Medicare Annual/Subsequent preventive examination.  Visit Complete: Virtual  I connected with  Nash Dimmer on 03/25/23 by a audio enabled telemedicine application and verified that I am speaking with the correct person using two identifiers.  Patient Location: Home  Provider Location: Office/Clinic  I discussed the limitations of evaluation and management by telemedicine. The patient expressed understanding and agreed to proceed.  Vital Signs: Unable to obtain new vitals due to this being a telehealth visit.  Patient Medicare AWV questionnaire was completed by the patient on (not done); I have confirmed that all information answered by patient is correct and no changes since this date.  Review of Systems    Cardiac Risk Factors include: advanced age (>66men, >18 women);diabetes mellitus;dyslipidemia;hypertension;male gender;sedentary lifestyle;obesity (BMI >30kg/m2)    Objective:    Today's Vitals   03/25/23 0920 03/25/23 0921  Weight: 223 lb (101.2 kg)   Height: 5\' 9"  (1.753 m)   PainSc:  5    Body mass index is 32.93 kg/m.     03/25/2023    9:37 AM 12/02/2021    1:45 PM 09/12/2020    8:27 AM 08/29/2020    8:36 AM 06/23/2019    8:54 AM 06/16/2018   11:16 AM 05/21/2017   10:35 AM  Advanced Directives  Does Patient Have a Medical Advance Directive? No No No No No No No  Does patient want to make changes to medical advance directive?     No - Patient declined    Would patient like information on creating a medical advance directive?  Yes (MAU/Ambulatory/Procedural Areas - Information given) No - Patient declined   Yes (MAU/Ambulatory/Procedural Areas - Information given)     Current Medications (verified) Outpatient Encounter Medications as of 03/25/2023  Medication Sig   amLODipine (NORVASC) 2.5 MG tablet Take 1 tablet (2.5 mg total) by mouth daily.   aspirin EC 81 MG tablet Take 81 mg by mouth every  morning.    buPROPion (WELLBUTRIN XL) 150 MG 24 hr tablet Take 1 tablet (150 mg total) by mouth See admin instructions. Take with 300 mg for a total of 450 mg daily   buPROPion (WELLBUTRIN XL) 300 MG 24 hr tablet Take 1 tablet (300 mg total) by mouth See admin instructions. Take with 150 mg for a total of 450 mg daily   celecoxib (CELEBREX) 100 MG capsule Take 1 capsule (100 mg total) by mouth daily. Do not take ibuprofen or any other nsaid's with this   dapagliflozin propanediol (FARXIGA) 10 MG TABS tablet Take 1 tablet (10 mg total) by mouth daily before breakfast.   diclofenac Sodium (VOLTAREN) 1 % GEL    Erenumab-aooe (AIMOVIG) 140 MG/ML SOAJ Inject 140 mg into the skin every 30 (thirty) days.   escitalopram (LEXAPRO) 20 MG tablet Take 1 tablet (20 mg total) by mouth daily.   gabapentin (NEURONTIN) 400 MG capsule Take 400 mg by mouth 4 (four) times daily.   ketoconazole (NIZORAL) 2 % cream Apply once or twice daily to affected areas on face and neck as needed   metFORMIN (GLUCOPHAGE-XR) 750 MG 24 hr tablet Take 2 tablets (1,500 mg total) by mouth daily with breakfast.   metoprolol succinate (TOPROL-XL) 50 MG 24 hr tablet Take 1 tablet (50 mg total) by mouth every evening. Take with or immediately following a meal.   metroNIDAZOLE (METROGEL) 0.75 % gel Apply to face 1-2 times a day for roscea.  NARCAN 4 MG/0.1ML LIQD nasal spray kit 0.4 mg once.   nitroGLYCERIN (NITROSTAT) 0.4 MG SL tablet DISSOLVE 1 TABLET UNDER THE TONGUE EVERY 5 MINUTES FOR UP TO 3 DOSES AS NEEDED FOR CHEST PAIN. IF NO RELIEF AFTER 3 DOSES, CALL 911 OR GO TO ER.   olmesartan-hydrochlorothiazide (BENICAR HCT) 40-12.5 MG tablet Take 1 tablet by mouth daily. (Patient not taking: Reported on 11/04/2022)   omeprazole (PRILOSEC) 40 MG capsule Take 1 capsule (40 mg total) by mouth daily.   oxyCODONE (ROXICODONE) 15 MG immediate release tablet Take 7.5-15 mg by mouth 5 (five) times daily as needed for pain.   Rimegepant Sulfate  (NURTEC) 75 MG TBDP Take 1 tablet by mouth daily as needed. (Patient not taking: Reported on 11/04/2022)   rosuvastatin (CRESTOR) 40 MG tablet Take 1 tablet (40 mg total) by mouth daily.   No facility-administered encounter medications on file as of 03/25/2023.    Allergies (verified) Shingrix [zoster vac recomb adjuvanted], Elavil [amitriptyline hcl], and Tape   History: Past Medical History:  Diagnosis Date   Actinic keratosis 11/25/2022   L lower lip   Anginal pain (HCC)    Basal cell carcinoma 02/21/2018   left posterior shoulder at lat edge of scar   Basal cell carcinoma 03/11/2021   Left upper back medial  SUPERFICIAL AND NODULAR PATTERNS, BASE INVOLVED, appears clear with biopsy   Basal cell carcinoma    left posterior shoulder, excised by Dr Birdie Sons in the past.   BCC (basal cell carcinoma of skin) 03/11/2021   left upper back lateral - SUPERFICIAL BASAL CELL CARCINOMA. Clear with biopsy.   BCC (basal cell carcinoma of skin) 03/11/2021   right mid back - BASAL CELL CARCINOMA, clear with biopsy.   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 in situ, patient needs shave removal and poss 5FU.   Past Surgical History:  Procedure Laterality Date   BACK SURGERY     BASAL CELL CARCINOMA EXCISION Left 12/02/2015   Chest-Done by Dermatologist    BASAL CELL CARCINOMA EXCISION Left 02/21/2018   Nodulocytic pattern, deep margin involved - [facility] - Dr. Willeen Niece   BASAL CELL CARCINOMA EXCISION  02/23/2018   CARDIAC CATHETERIZATION Left 04/01/2016   Procedure: Left Heart Cath and Coronary Angiography;  Surgeon:  Dalia Heading, MD;  Location: [facility] INVASIVE CV LAB;  Service: Cardiovascular;  Laterality: Left;   CARDIAC CATHETERIZATION N/A 04/01/2016   Procedure: Intravascular Pressure Wire/FFR Study;  Surgeon: Marcina Millard, MD;  Location: [facility] INVASIVE CV LAB;  Service: Cardiovascular;  Laterality: N/A;   COLONOSCOPY N/A 01/15/2015   Wohl-ileitis, 2 benign polyps, cryptitis, sigmoid diverticulosis, focal ulceration ICV   COLONOSCOPY WITH PROPOFOL N/A 08/29/2020   Procedure: COLONOSCOPY WITH PROPOFOL;  Surgeon: Midge Minium, MD;  Location: [facility] ENDOSCOPY;  Service: Endoscopy;  Laterality: N/A;   CORONARY PRESSURE/FFR STUDY N/A 09/09/2021   Procedure: INTRAVASCULAR PRESSURE WIRE/FFR STUDY;  Surgeon: Armando Reichert, MD;  Location: [facility] INVASIVE CV LAB;  Service: Cardiovascular;  Laterality: N/A;   CORONARY STENT  PLACEMENT     FRACTIONAL FLOW RESERVE WIRE  10/08/2011   Procedure: FRACTIONAL FLOW RESERVE WIRE;  Surgeon: Robynn Pane, MD;  Location: [facility] CATH LAB;  Service: Cardiovascular;;   HERNIA REPAIR     KNEE SURGERY     LEFT HEART CATH AND CORONARY ANGIOGRAPHY N/A 09/09/2021   Procedure: LEFT HEART CATH AND CORONARY ANGIOGRAPHY;  Surgeon: Armando Reichert, MD;  Location: [facility] INVASIVE CV LAB;  Service: Cardiovascular;  Laterality: N/A;   LEFT HEART CATHETERIZATION WITH CORONARY ANGIOGRAM N/A 10/08/2011   Procedure: LEFT HEART CATHETERIZATION WITH CORONARY ANGIOGRAM;  Surgeon: Robynn Pane, MD;  Location: [facility] CATH LAB;  Service: Cardiovascular;  Laterality: N/A;   parodectomy Left 05/07/2020   SKIN CANCER EXCISION  01/2018   [facility]   TOTAL HIP ARTHROPLASTY Left 04/06/2017   Procedure: TOTAL HIP ARTHROPLASTY ANTERIOR APPROACH;  Surgeon: Kennedy Bucker, MD;  Location: [facility] ORS;  Service: Orthopedics;  Laterality: Left;   Family History  Problem Relation Age of Onset   Lung cancer Father    Heart disease Father    Skin cancer Father    Social History   Socioeconomic History    Marital status: Married    Spouse name: Liborio Nixon   Number of children: 1   Years of education: Not on file   Highest education level: High school graduate  Occupational History   Occupation: retired  Tobacco Use   Smoking status: Former    Current packs/day: 0.00    Types: Cigarettes    Start date: 08/03/1972    Quit date: 08/03/2012    Years since quitting: 10.6   Smokeless tobacco: Never  Vaping Use   Vaping status: Never Used  Substance and Sexual Activity   Alcohol use: No    Alcohol/week: 0.0 standard drinks of alcohol   Drug use: No   Sexual activity: Yes    Partners: Female    Birth control/protection: None  Other Topics Concern   Not on file  Social History Narrative   Wife has 2 children by another marriage and he has 1 child by a previous marriage.   Social Determinants of Health   Financial Resource Strain: Low Risk  (03/25/2023)   Overall Financial Resource Strain (CARDIA)    Difficulty of Paying Living Expenses: Not hard at all  Food Insecurity: No Food Insecurity (12/02/2021)   Hunger Vital Sign    Worried About Running Out of Food in the Last Year: Never true    Ran Out of Food in the Last Year: Never true  Transportation Needs: No Transportation Needs (03/25/2023)   PRAPARE - Administrator, Civil Service (Medical): No    Lack of Transportation (Non-Medical): No  Physical Activity: Inactive (03/25/2023)   Exercise Vital Sign    Days of Exercise per Week: 0 days    Minutes of Exercise per Session: 0 min  Stress: No Stress Concern Present (03/25/2023)   [employer] of Occupational Health - Occupational Stress Questionnaire    Feeling of Stress : Not at all  Social Connections: Moderately Isolated (03/25/2023)   Social Connection and Isolation Panel [NHANES]    Frequency of Communication with Friends and Family: More than three times a week    Frequency of Social Gatherings with Friends and Family: Once a week    Attends Religious Services: Never     Database administrator or Organizations: No    Attends Banker Meetings: Never    Marital Status: Married  Tobacco Counseling Counseling given: Not Answered   Clinical Intake:  Pre-visit preparation completed: Yes  Pain : 0-10 Pain Score: 5  Pain Type: Chronic pain Pain Location: Back Pain Orientation: Lower Pain Descriptors / Indicators: Aching Pain Onset: More than a month ago Pain Frequency: Constant Pain Relieving Factors: medications  Pain Relieving Factors: medications  BMI - recorded: 32.93 Nutritional Status: BMI > 30  Obese Nutritional Risks: None Diabetes: Yes CBG done?: No Did pt. bring in CBG monitor from home?: No  How often do you need to have someone help you when you read instructions, pamphlets, or other written materials from your doctor or pharmacy?: 1 - Never  Interpreter Needed?: No  Comments: lives with wife Information entered by :: B.Dessire Grimes,LPN   Activities of Daily Living    03/25/2023    9:37 AM 11/04/2022    8:31 AM  In your present state of health, do you have any difficulty performing the following activities:  Hearing? 0 1  Vision? 0 0  Difficulty concentrating or making decisions? 1 1  Walking or climbing stairs? 1 1  Dressing or bathing? 0 0  Doing errands, shopping? 0 0  Preparing Food and eating ? N   Using the Toilet? N   In the past six months, have you accidently leaked urine? N   Do you have problems with loss of bowel control? N   Managing your Medications? N   Managing your Finances? N   Housekeeping or managing your Housekeeping? N     Patient Care Team: Alba Cory, MD as PCP - General (Family Medicine) Ewing Schlein, MD as Referring Physician (Pain Medicine) Willeen Niece, MD (Dermatology) Midge Minium, MD as Consulting Physician (Gastroenterology) Armando Reichert, MD as Referring Physician (Cardiology) Pa, [facility] (Optometry)  Indicate any recent Medical Services you  may have received from other than [facility] providers in the past year (date may be approximate).     Assessment:   This is a routine wellness examination for Quintell.  Hearing/Vision screen Hearing Screening - Comments:: Adequate hearing Vision Screening - Comments:: Adequate vision;readers only  Eye  Dietary issues and exercise activities discussed:     Goals Addressed             This Visit's Progress    Increase physical activity   Not on track    Recommend increase moderate physical activity to 30 minutes per day 3 days per week. Pt states he plans to [facility] silver sneakers program [facility]       Depression Screen    03/25/2023    9:35 AM 11/04/2022    8:31 AM 09/21/2022   11:25 AM 09/15/2022    7:34 AM 07/30/2022   10:00 AM 04/28/2022    2:29 PM 03/13/2022    7:56 AM  PHQ 2/9 Scores  PHQ - 2 Score 0 0 0 0 0 0 0  PHQ- 9 Score  0 0 0 0 0 0    Fall Risk    03/25/2023    9:26 AM 11/04/2022    8:30 AM 09/21/2022   11:25 AM 09/15/2022    7:34 AM 07/30/2022    9:59 AM  Fall Risk   Falls in the past year? 0 1 0 0 1  Number falls in past yr: 0 0 0 0 0  Injury with Fall? 0 0 0 0 0  Risk for fall due to : No Fall Risks History of fall(s) No Fall Risks No Fall Risks History  of fall(s)  Follow up Education provided;Falls prevention discussed Falls prevention discussed;Education provided;Falls evaluation completed Falls prevention discussed;Education provided;Falls evaluation completed Falls prevention discussed Falls prevention discussed;Falls evaluation completed;Education provided    MEDICARE RISK AT HOME: Medicare Risk at Home Any stairs in or around the home?: No If so, are there any without handrails?: No Home free of loose throw rugs in walkways, pet beds, electrical cords, etc?: Yes Adequate lighting in your home to reduce risk of falls?: Yes Life alert?: No Use of a cane, walker or w/c?: No Grab bars in the bathroom?: No Shower chair or bench in shower?:  No Elevated toilet seat or a handicapped toilet?: No  TIMED UP AND GO:  Was the test performed?  No    Cognitive Function:        03/25/2023    9:38 AM  6CIT Screen  What Year? 0 points  What month? 0 points  What time? 0 points  Count back from 20 0 points  Months in reverse 0 points  Repeat phrase 0 points  Total Score 0 points    Immunizations Immunization History  Administered Date(s) Administered   Fluad Quad(high Dose 65+) 05/09/2019, 04/28/2022   Influenza, High Dose Seasonal PF 05/11/2017, 05/20/2018, 03/31/2021   Influenza,inj,Quad PF,6+ Mos 03/28/2015, 05/11/2016, 05/09/2020   Influenza-Unspecified 04/03/2014   PFIZER(Purple Top)SARS-COV-2 Vaccination 08/25/2019, 09/14/2019, 05/20/2020   PNEUMOCOCCAL CONJUGATE-20 04/28/2022   Pneumococcal Conjugate-13 04/03/2014   Pneumococcal Polysaccharide-23 04/07/2017   Tdap 04/03/2014   Zoster Recombinant(Shingrix) 12/16/2017   Zoster, Live 06/18/2014    TDAP status: Up to date  Flu Vaccine status: Up to date  Pneumococcal vaccine status: Up to date  Covid-19 vaccine status: Completed vaccines  Qualifies for Shingles Vaccine? Yes   Zostavax completed Yes   Shingrix Completed?: Yes  Screening Tests Health Maintenance  Topic Date Due   OPHTHALMOLOGY EXAM  12/20/2021   COVID-19 Vaccine (4 - 2023-24 season) 04/03/2022   Diabetic kidney evaluation - eGFR measurement  03/14/2023   Diabetic kidney evaluation - Urine ACR  03/14/2023   INFLUENZA VACCINE  03/04/2023   HEMOGLOBIN A1C  05/06/2023   FOOT EXAM  11/04/2023   Medicare Annual Wellness (AWV)  03/24/2024   DTaP/Tdap/Td (2 - Td or Tdap) 04/03/2024   Colonoscopy  08/30/2027   Pneumonia Vaccine 72+ Years old  Completed   Hepatitis C Screening  Completed   HPV VACCINES  Aged Out   Zoster Vaccines- Shingrix  Discontinued    Health Maintenance  Health Maintenance Due  Topic Date Due   OPHTHALMOLOGY EXAM  12/20/2021   COVID-19 Vaccine (4 - 2023-24  season) 04/03/2022   Diabetic kidney evaluation - eGFR measurement  03/14/2023   Diabetic kidney evaluation - Urine ACR  03/14/2023   INFLUENZA VACCINE  03/04/2023    Colorectal cancer screening: Type of screening: Colonoscopy. Completed yes. Repeat every 5 years  Lung Cancer Screening: (Low Dose CT Chest recommended if Age 67-80 years, 20 pack-year currently smoking OR have quit w/in 15years.) does not qualify.   Lung Cancer Screening Referral: no  Additional Screening:  Hepatitis C Screening: does not qualify; Completed yes  Vision Screening: Recommended annual ophthalmology exams for early detection of glaucoma and other disorders of the eye. Is the patient up to date with their annual eye exam?  Yes  Who is the provider or what is the name of the office in which the patient attends annual eye exams?  Eye If pt is not established with a provider, would they  like to be referred to a provider to establish care? No .   Dental Screening: Recommended annual dental exams for proper oral hygiene  Diabetic Foot Exam: Diabetic Foot Exam: Completed yes  Community Resource Referral / Chronic Care Management: CRR required this visit?  No   CCM required this visit?  No     Plan:     I have personally reviewed and noted the following in the patient's chart:   Medical and social history Use of alcohol, tobacco or illicit drugs  Current medications and supplements including opioid prescriptions. Patient is currently taking opioid prescriptions. Information provided to patient regarding non-opioid alternatives. Patient advised to discuss non-opioid treatment plan with their provider. Functional ability and status Nutritional status Physical activity Advanced directives List of other physicians Hospitalizations, surgeries, and ER visits in previous 12 months Vitals Screenings to include cognitive, depression, and falls Referrals and appointments  In addition, I have  reviewed and discussed with patient certain preventive protocols, quality metrics, and best practice recommendations. A written personalized care plan for preventive services as well as general preventive health recommendations were provided to patient.     Sue Lush, LPN   1/61/0960   After Visit Summary: (MyChart) Due to this being a telephonic visit, the after visit summary with patients personalized plan was offered to patient via MyChart   Nurse Notes: The patient states they are doing well and has no concerns or questions at this times.[name] He continues to manage chronic back pain through visits with the Pain Clinic.

## 2023-03-25 NOTE — Patient Instructions (Signed)
Jimmy Green , Thank you for taking time to come for your Medicare Wellness Visit. I appreciate your ongoing commitment to your health goals. Please review the following plan we discussed and let me know if I can assist you in the future.   Referrals/Orders/Follow-Ups/Clinician Recommendations: none  This is a list of the screening recommended for you and due dates:  Health Maintenance  Topic Date Due   Eye exam for diabetics  12/20/2021   COVID-19 Vaccine (4 - 2023-24 season) 04/03/2022   Yearly kidney function blood test for diabetes  03/14/2023   Yearly kidney health urinalysis for diabetes  03/14/2023   Flu Shot  03/04/2023   Hemoglobin A1C  05/06/2023   Complete foot exam   11/04/2023   Medicare Annual Wellness Visit  03/24/2024   DTaP/Tdap/Td vaccine (2 - Td or Tdap) 04/03/2024   Colon Cancer Screening  08/30/2027   Pneumonia Vaccine  Completed   Hepatitis C Screening  Completed   HPV Vaccine  Aged Out   Zoster (Shingles) Vaccine  Discontinued    Advanced directives: (Copy Requested) Please bring a copy of your health care power of attorney and living will to the office to be added to your chart at your convenience. PT HAS PAPERWORK AT HOME TO COMPLETE  Next Medicare Annual Wellness Visit scheduled for next year: Yes 03/30/24 @ 9:45am telephone

## 2023-04-13 DIAGNOSIS — M5136 Other intervertebral disc degeneration, lumbar region: Secondary | ICD-10-CM | POA: Diagnosis not present

## 2023-04-13 DIAGNOSIS — R202 Paresthesia of skin: Secondary | ICD-10-CM | POA: Diagnosis not present

## 2023-04-13 DIAGNOSIS — Z79891 Long term (current) use of opiate analgesic: Secondary | ICD-10-CM | POA: Diagnosis not present

## 2023-04-13 DIAGNOSIS — G2581 Restless legs syndrome: Secondary | ICD-10-CM | POA: Diagnosis not present

## 2023-04-13 DIAGNOSIS — G894 Chronic pain syndrome: Secondary | ICD-10-CM | POA: Diagnosis not present

## 2023-04-16 DIAGNOSIS — E785 Hyperlipidemia, unspecified: Secondary | ICD-10-CM | POA: Diagnosis not present

## 2023-04-16 DIAGNOSIS — I82C12 Acute embolism and thrombosis of left internal jugular vein: Secondary | ICD-10-CM | POA: Diagnosis not present

## 2023-04-16 DIAGNOSIS — G4733 Obstructive sleep apnea (adult) (pediatric): Secondary | ICD-10-CM | POA: Diagnosis not present

## 2023-04-16 DIAGNOSIS — Z23 Encounter for immunization: Secondary | ICD-10-CM | POA: Diagnosis not present

## 2023-04-16 DIAGNOSIS — E1169 Type 2 diabetes mellitus with other specified complication: Secondary | ICD-10-CM | POA: Diagnosis not present

## 2023-04-16 DIAGNOSIS — R519 Headache, unspecified: Secondary | ICD-10-CM | POA: Diagnosis not present

## 2023-04-16 LAB — COMPREHENSIVE METABOLIC PANEL: eGFR: 99

## 2023-04-20 ENCOUNTER — Other Ambulatory Visit: Payer: Self-pay | Admitting: Student

## 2023-04-20 DIAGNOSIS — R519 Headache, unspecified: Secondary | ICD-10-CM

## 2023-04-28 ENCOUNTER — Other Ambulatory Visit: Payer: Self-pay | Admitting: Family Medicine

## 2023-04-28 DIAGNOSIS — E1169 Type 2 diabetes mellitus with other specified complication: Secondary | ICD-10-CM

## 2023-04-28 DIAGNOSIS — E1159 Type 2 diabetes mellitus with other circulatory complications: Secondary | ICD-10-CM

## 2023-04-28 DIAGNOSIS — I152 Hypertension secondary to endocrine disorders: Secondary | ICD-10-CM

## 2023-04-28 DIAGNOSIS — I10 Essential (primary) hypertension: Secondary | ICD-10-CM

## 2023-04-30 ENCOUNTER — Ambulatory Visit
Admission: RE | Admit: 2023-04-30 | Discharge: 2023-04-30 | Disposition: A | Discharge status: Home / Self Care | Payer: 59 | Source: Ambulatory Visit | Attending: Student | Admitting: Student

## 2023-04-30 DIAGNOSIS — I6782 Cerebral ischemia: Secondary | ICD-10-CM | POA: Diagnosis not present

## 2023-04-30 DIAGNOSIS — R519 Headache, unspecified: Secondary | ICD-10-CM | POA: Diagnosis not present

## 2023-04-30 DIAGNOSIS — J329 Chronic sinusitis, unspecified: Secondary | ICD-10-CM | POA: Diagnosis not present

## 2023-04-30 MED ORDER — GADOBUTROL 1 MMOL/ML IV SOLN
10.0000 mL/kg | Freq: Once | INTRAVENOUS | Status: AC | PRN
  Administered 2023-04-30: 10 mL via INTRAVENOUS

## 2023-05-05 ENCOUNTER — Telehealth: Payer: Self-pay | Admitting: Family Medicine

## 2023-05-05 NOTE — Telephone Encounter (Signed)
Pt wife said that pt had a MRI on Sept 27 th and they are unable to get into his my chart to see the results. Wife said that she thinks that Dr Carlynn Purl was the one to order the test. They are asking for someone to call them back [phone].

## 2023-05-05 NOTE — Telephone Encounter (Signed)
Called and informed patient his MRI was ordered by his Neurologist. Patient verbalized understanding.

## 2023-05-05 NOTE — Telephone Encounter (Signed)
Pt's wife is calling in requesting to speak with Bjorn Loser. Jan says it's about an appointment and pt's MyChart. Please follow up with Jan  [phone]

## 2023-05-06 ENCOUNTER — Other Ambulatory Visit: Payer: Self-pay | Admitting: Family Medicine

## 2023-05-10 ENCOUNTER — Ambulatory Visit (INDEPENDENT_AMBULATORY_CARE_PROVIDER_SITE_OTHER): Payer: 59

## 2023-05-10 ENCOUNTER — Other Ambulatory Visit: Payer: Self-pay

## 2023-05-10 DIAGNOSIS — I10 Essential (primary) hypertension: Secondary | ICD-10-CM

## 2023-05-10 DIAGNOSIS — Z23 Encounter for immunization: Secondary | ICD-10-CM

## 2023-05-11 ENCOUNTER — Other Ambulatory Visit: Payer: Self-pay | Admitting: Family Medicine

## 2023-05-11 DIAGNOSIS — M5136 Other intervertebral disc degeneration, lumbar region with discogenic back pain only: Secondary | ICD-10-CM | POA: Diagnosis not present

## 2023-05-11 DIAGNOSIS — E1169 Type 2 diabetes mellitus with other specified complication: Secondary | ICD-10-CM

## 2023-05-11 DIAGNOSIS — R202 Paresthesia of skin: Secondary | ICD-10-CM | POA: Diagnosis not present

## 2023-05-11 DIAGNOSIS — G2581 Restless legs syndrome: Secondary | ICD-10-CM | POA: Diagnosis not present

## 2023-05-11 MED ORDER — OLMESARTAN MEDOXOMIL-HCTZ 40-12.5 MG PO TABS
1.0000 | ORAL_TABLET | Freq: Every day | ORAL | 0 refills | Status: DC
Start: 2023-05-11 — End: 2023-05-13

## 2023-05-12 NOTE — Progress Notes (Unsigned)
Established Patient Office Visit  Subjective   Patient ID: Jimmy Green, male    DOB: 04-11-1951  Age: 72 y.o. MRN: [id number]  No chief complaint on file.   HPI  Patient is here to follow up on chronic medical conditions. New to me. Last seen in this office 11/04/22.  Hypertension: -Medications: Amlodipine 2.5 mg, Olmesartan-hydrochlorothiazide 40-12.5 mg, Metoprolol 50 mg, nitroglycerin PRN -Patient is compliant with above medications and reports no side effects. -Checking BP at home (average): *** -Highest BP at home: *** -Lowest BP at home: *** -Denies any SOB, CP, vision changes, LE edema or symptoms of hypotension -Diet: *** -Exercise: ***  HLD/CAD/Hx of left internal jugular vein thrombosis: -Medications: Crestor 40 mg, aspirin -Patient is compliant with above medications and reports no side effects.  -Following with Cardiology  -Last lipid panel: Lipid Panel     Component Value Date/Time   CHOL 96 03/13/2022 0847   CHOL 149 06/07/2015 0935   TRIG 284 (H) 03/13/2022 0847   HDL 26 (L) 03/13/2022 0847   HDL 28 (L) 06/07/2015 0935   CHOLHDL 3.7 03/13/2022 0847   VLDL 29 10/01/2016 1026   LDLCALC 37 03/13/2022 0847   LABVLDL 27 06/07/2015 0935    Diabetes, Type 2: -Last A1c 7.0% 4/24 -Medications: Metformin XR 1500 mg once, Farxiga 10 mg -Failed Meds: Rybelsus  -Patient is compliant with the above medications and reports no side effects. *** -Checking BG at home: *** -Fasting home BG: *** -Post-prandial home BG: *** -Highest home BG since last visit: *** -Lowest home BG since last visit: *** -Diet: *** -Exercise: *** -Eye exam: Due -Foot exam: ? -Microalbumin: Due -Statin: yes -PNA vaccine: *** -Denies symptoms of hypoglycemia, polyuria, polydipsia, numbness extremities, foot ulcers/trauma. ***  MDD: -Mood status: {Blank single:19197::"controlled","uncontrolled","better","worse","exacerbated","stable"} -Current treatment: Lexapro 20 mg, Wellbutrin  450 mg XL -Satisfied with current treatment?: {Blank single:19197::"yes","no"} -Symptom severity: {Blank single:19197::"mild","moderate","severe"}  -Duration of current treatment : {Blank single:19197::"chronic","months","years"} -Side effects: {Blank single:19197::"yes","no"} Medication compliance: {Blank single:19197::"excellent compliance","good compliance","fair compliance","poor compliance"} Psychotherapy/counseling: {Blank single:19197::"yes","no"} {Blank single:19197::"current","in the past"} Previous psychiatric medications: {Blank multiple:19196::"abilify","amitryptiline","buspar","celexa","cymbalta","depakote","effexor","lamictal","lexapro","lithium","nortryptiline","paxil","prozac","pristiq (desvenlafaxine","seroquel","wellbutrin","zoloft","zyprexa"} Depressed mood: {Blank single:19197::"yes","no"} Anxious mood: {Blank single:19197::"yes","no"} Anhedonia: {Blank single:19197::"yes","no"} Significant weight loss or gain: {Blank single:19197::"yes","no"} Insomnia: {Blank single:19197::"yes","no"} {Blank single:19197::"hard to fall asleep","hard to stay asleep"} Fatigue: {Blank single:19197::"yes","no"} Feelings of worthlessness or guilt: {Blank single:19197::"yes","no"} Impaired concentration/indecisiveness: {Blank single:19197::"yes","no"} Suicidal ideations: {Blank single:19197::"yes","no"} Hopelessness: {Blank single:19197::"yes","no"} Crying spells: {Blank single:19197::"yes","no"}    03/25/2023    9:35 AM 11/04/2022    8:31 AM 09/21/2022   11:25 AM 09/15/2022    7:34 AM 07/30/2022   10:00 AM  Depression screen PHQ 2/9  Decreased Interest 0 0 0 0 0  Down, Depressed, Hopeless 0 0 0 0 0  PHQ - 2 Score 0 0 0 0 0  Altered sleeping  0 0 0 0  Tired, decreased energy  0 0 0 0  Change in appetite  0 0 0 0  Feeling bad or failure about yourself   0 0 0 0  Trouble concentrating  0 0 0 0  Moving slowly or fidgety/restless  0 0 0 0  Suicidal thoughts  0 0 0 0  PHQ-9 Score  0 0 0 0   Difficult doing work/chores   Not difficult at all     Migraines:  -Currently on Aimovig  -Follows with Neurology   Chronic Pain in Left Lower Back:  -Following with pain management in [place], on Oxycodone 7.5 - 15 mg 5 times daily PRN -Has  Narcan available   RLS: -Currently on Gabapentin  GERD: -Currently on Prilosec 40 mg  {History (Optional):23778}  ROS    Objective:     There were no vitals taken for this visit. {Vitals History (Optional):23777}  Physical Exam   No results found for any visits on 05/13/23.  {Labs (Optional):23779}  The ASCVD Risk score (Arnett DK, et al., 2019) failed to calculate for the following reasons:   The valid total cholesterol range is 130 to 320 mg/dL    Assessment & Plan:   Problem List Items Addressed This Visit   None   No follow-ups on file.    Margarita Mail, DO

## 2023-05-13 ENCOUNTER — Encounter: Payer: Self-pay | Admitting: Internal Medicine

## 2023-05-13 ENCOUNTER — Ambulatory Visit: Payer: 59 | Admitting: Internal Medicine

## 2023-05-13 VITALS — BP 130/80 | HR 68 | Temp 97.4°F | Resp 16 | Ht 69.0 in | Wt 219.0 lb

## 2023-05-13 DIAGNOSIS — E1159 Type 2 diabetes mellitus with other circulatory complications: Secondary | ICD-10-CM | POA: Diagnosis not present

## 2023-05-13 DIAGNOSIS — E785 Hyperlipidemia, unspecified: Secondary | ICD-10-CM

## 2023-05-13 DIAGNOSIS — E1169 Type 2 diabetes mellitus with other specified complication: Secondary | ICD-10-CM

## 2023-05-13 DIAGNOSIS — F3340 Major depressive disorder, recurrent, in remission, unspecified: Secondary | ICD-10-CM | POA: Diagnosis not present

## 2023-05-13 DIAGNOSIS — G43009 Migraine without aura, not intractable, without status migrainosus: Secondary | ICD-10-CM

## 2023-05-13 DIAGNOSIS — I152 Hypertension secondary to endocrine disorders: Secondary | ICD-10-CM

## 2023-05-13 DIAGNOSIS — K219 Gastro-esophageal reflux disease without esophagitis: Secondary | ICD-10-CM

## 2023-05-13 DIAGNOSIS — I2510 Atherosclerotic heart disease of native coronary artery without angina pectoris: Secondary | ICD-10-CM

## 2023-05-13 DIAGNOSIS — Z7984 Long term (current) use of oral hypoglycemic drugs: Secondary | ICD-10-CM

## 2023-05-13 LAB — POCT GLYCOSYLATED HEMOGLOBIN (HGB A1C): Hemoglobin A1C: 8.8 % — AB (ref 4.0–5.6)

## 2023-05-13 MED ORDER — OLMESARTAN MEDOXOMIL-HCTZ 40-12.5 MG PO TABS
1.0000 | ORAL_TABLET | Freq: Every day | ORAL | 1 refills | Status: DC
Start: 2023-05-13 — End: 2023-12-28

## 2023-05-13 MED ORDER — AMLODIPINE BESYLATE 2.5 MG PO TABS
2.5000 mg | ORAL_TABLET | Freq: Every day | ORAL | 1 refills | Status: DC
Start: 2023-05-13 — End: 2023-12-28

## 2023-05-13 MED ORDER — RYBELSUS 3 MG PO TABS: 3.0000 mg | ORAL_TABLET | Freq: Every day | ORAL | Status: DC

## 2023-05-13 MED ORDER — METFORMIN HCL ER 750 MG PO TB24
1500.0000 mg | ORAL_TABLET | Freq: Every day | ORAL | 1 refills | Status: DC
Start: 2023-05-13 — End: 2023-12-28

## 2023-05-13 MED ORDER — AIMOVIG 140 MG/ML ~~LOC~~ SOAJ
1.0000 | SUBCUTANEOUS | 5 refills | Status: DC
Start: 2023-05-13 — End: 2023-12-28

## 2023-05-13 MED ORDER — ROSUVASTATIN CALCIUM 40 MG PO TABS
40.0000 mg | ORAL_TABLET | Freq: Every day | ORAL | 3 refills | Status: DC
Start: 2023-05-13 — End: 2024-05-30

## 2023-05-13 MED ORDER — BUPROPION HCL ER (XL) 300 MG PO TB24
300.0000 mg | ORAL_TABLET | ORAL | 1 refills | Status: DC
Start: 2023-05-13 — End: 2023-12-28

## 2023-05-13 MED ORDER — METOPROLOL SUCCINATE ER 50 MG PO TB24
50.0000 mg | ORAL_TABLET | Freq: Every evening | ORAL | 1 refills | Status: DC
Start: 2023-05-13 — End: 2024-05-30

## 2023-05-13 MED ORDER — OMEPRAZOLE 40 MG PO CPDR
40.0000 mg | DELAYED_RELEASE_CAPSULE | Freq: Every day | ORAL | 1 refills | Status: DC
Start: 2023-05-13 — End: 2023-12-28

## 2023-05-13 MED ORDER — ESCITALOPRAM OXALATE 20 MG PO TABS
20.0000 mg | ORAL_TABLET | Freq: Every day | ORAL | 1 refills | Status: DC
Start: 2023-05-13 — End: 2023-12-28

## 2023-05-13 MED ORDER — BUPROPION HCL ER (XL) 150 MG PO TB24
150.0000 mg | ORAL_TABLET | ORAL | 1 refills | Status: DC
Start: 2023-05-13 — End: 2023-12-28

## 2023-05-14 LAB — MICROALBUMIN / CREATININE URINE RATIO
Creatinine, Urine: 106 mg/dL (ref 20–320)
Microalb Creat Ratio: 35 mg/g{creat} — ABNORMAL HIGH (ref <30)
Microalb, Ur: 3.7 mg/dL

## 2023-05-31 DIAGNOSIS — G8929 Other chronic pain: Secondary | ICD-10-CM | POA: Diagnosis not present

## 2023-05-31 DIAGNOSIS — M722 Plantar fascial fibromatosis: Secondary | ICD-10-CM | POA: Diagnosis not present

## 2023-05-31 DIAGNOSIS — M216X2 Other acquired deformities of left foot: Secondary | ICD-10-CM | POA: Diagnosis not present

## 2023-05-31 DIAGNOSIS — M7731 Calcaneal spur, right foot: Secondary | ICD-10-CM | POA: Diagnosis not present

## 2023-05-31 DIAGNOSIS — M216X1 Other acquired deformities of right foot: Secondary | ICD-10-CM | POA: Diagnosis not present

## 2023-05-31 DIAGNOSIS — M79671 Pain in right foot: Secondary | ICD-10-CM | POA: Diagnosis not present

## 2023-05-31 DIAGNOSIS — M545 Low back pain, unspecified: Secondary | ICD-10-CM | POA: Diagnosis not present

## 2023-06-07 ENCOUNTER — Other Ambulatory Visit: Payer: Self-pay | Admitting: Family Medicine

## 2023-06-08 DIAGNOSIS — G894 Chronic pain syndrome: Secondary | ICD-10-CM | POA: Diagnosis not present

## 2023-06-08 DIAGNOSIS — M51362 Other intervertebral disc degeneration, lumbar region with discogenic back pain and lower extremity pain: Secondary | ICD-10-CM | POA: Diagnosis not present

## 2023-06-08 DIAGNOSIS — Z79891 Long term (current) use of opiate analgesic: Secondary | ICD-10-CM | POA: Diagnosis not present

## 2023-06-21 ENCOUNTER — Other Ambulatory Visit: Payer: Self-pay | Admitting: Family Medicine

## 2023-06-21 ENCOUNTER — Ambulatory Visit (INDEPENDENT_AMBULATORY_CARE_PROVIDER_SITE_OTHER): Payer: 59 | Admitting: Podiatry

## 2023-06-21 ENCOUNTER — Encounter: Payer: Self-pay | Admitting: Podiatry

## 2023-06-21 ENCOUNTER — Ambulatory Visit: Payer: 59 | Admitting: Dermatology

## 2023-06-21 ENCOUNTER — Ambulatory Visit (INDEPENDENT_AMBULATORY_CARE_PROVIDER_SITE_OTHER): Payer: 59

## 2023-06-21 DIAGNOSIS — M722 Plantar fascial fibromatosis: Secondary | ICD-10-CM

## 2023-06-21 DIAGNOSIS — I2510 Atherosclerotic heart disease of native coronary artery without angina pectoris: Secondary | ICD-10-CM

## 2023-06-21 MED ORDER — MELOXICAM 15 MG PO TABS: 15.0000 mg | ORAL_TABLET | Freq: Every day | ORAL | 3 refills | Status: DC

## 2023-06-21 MED ORDER — TRIAMCINOLONE ACETONIDE 40 MG/ML IJ SUSP
20.0000 mg | Freq: Once | INTRAMUSCULAR | Status: AC
Start: 2023-06-21 — End: 2023-06-21
  Administered 2023-06-21: 20 mg

## 2023-06-21 NOTE — Progress Notes (Signed)
Subjective:  Patient ID: Jimmy Green, male    DOB: 11-11-1950,  MRN: [id number] HPI Chief Complaint  Patient presents with   Foot Pain    Plantar heel right - aching x 2 months, doc at [facility] xrayed-said had a bone spur, Rx'd prednisone-helped some, but still sore, had to switch from [facility] due to insurance, diabetic - 8.0   New Patient (Initial Visit)    72 y.o. male presents with the above complaint.   ROS: Denies fever chills nausea vomiting muscle aches pains calf pain back pain chest pain shortness of breath.  Past Medical History:  Diagnosis Date   Actinic keratosis 11/25/2022   L lower lip   Anginal pain (HCC)    Basal cell carcinoma 02/21/2018   left posterior shoulder at lat edge of scar   Basal cell carcinoma 03/11/2021   Left upper back medial  SUPERFICIAL AND NODULAR PATTERNS, BASE INVOLVED, appears clear with biopsy   Basal cell carcinoma    left posterior shoulder, excised by Dr Birdie Sons in the past.   BCC (basal cell carcinoma of skin) 03/11/2021   left upper back lateral - SUPERFICIAL BASAL CELL CARCINOMA. Clear with biopsy.   BCC (basal cell carcinoma of skin) 03/11/2021   right mid back - BASAL CELL CARCINOMA, clear with biopsy.   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 in situ, patient needs shave removal and poss 5FU.   Past Surgical History:  Procedure Laterality Date   BACK SURGERY     BASAL CELL CARCINOMA EXCISION Left 12/02/2015   Chest-Done by Dermatologist    BASAL CELL CARCINOMA EXCISION Left 02/21/2018   Nodulocytic pattern, deep margin involved - [facility] - Dr. Willeen Niece    BASAL CELL CARCINOMA EXCISION  02/23/2018   CARDIAC CATHETERIZATION Left 04/01/2016   Procedure: Left Heart Cath and Coronary Angiography;  Surgeon: Dalia Heading, MD;  Location: [facility] INVASIVE CV LAB;  Service: Cardiovascular;  Laterality: Left;   CARDIAC CATHETERIZATION N/A 04/01/2016   Procedure: Intravascular Pressure Wire/FFR Study;  Surgeon: Marcina Millard, MD;  Location: [facility] INVASIVE CV LAB;  Service: Cardiovascular;  Laterality: N/A;   COLONOSCOPY N/A 01/15/2015   Wohl-ileitis, 2 benign polyps, cryptitis, sigmoid diverticulosis, focal ulceration ICV   COLONOSCOPY WITH PROPOFOL N/A 08/29/2020   Procedure: COLONOSCOPY WITH PROPOFOL;  Surgeon: Midge Minium, MD;  Location: [facility] ENDOSCOPY;  Service: Endoscopy;  Laterality: N/A;   CORONARY PRESSURE/FFR STUDY N/A 09/09/2021   Procedure: INTRAVASCULAR PRESSURE WIRE/FFR STUDY;  Surgeon: Armando Reichert, MD;  Location: [facility] INVASIVE CV LAB;  Service: Cardiovascular;  Laterality: N/A;   CORONARY STENT PLACEMENT     FRACTIONAL FLOW RESERVE WIRE  10/08/2011   Procedure: FRACTIONAL FLOW RESERVE WIRE;  Surgeon: Robynn Pane, MD;  Location: [facility] CATH LAB;  Service: Cardiovascular;;   HERNIA REPAIR     KNEE SURGERY     LEFT HEART CATH AND CORONARY ANGIOGRAPHY N/A 09/09/2021   Procedure: LEFT HEART CATH AND CORONARY ANGIOGRAPHY;  Surgeon: Armando Reichert, MD;  Location: [facility] INVASIVE CV  LAB;  Service: Cardiovascular;  Laterality: N/A;   LEFT HEART CATHETERIZATION WITH CORONARY ANGIOGRAM N/A 10/08/2011   Procedure: LEFT HEART CATHETERIZATION WITH CORONARY ANGIOGRAM;  Surgeon: Robynn Pane, MD;  Location: [facility] CATH LAB;  Service: Cardiovascular;  Laterality: N/A;   parodectomy Left 05/07/2020   SKIN CANCER EXCISION  01/2018   [facility]   TOTAL HIP ARTHROPLASTY Left 04/06/2017   Procedure: TOTAL HIP ARTHROPLASTY ANTERIOR APPROACH;  Surgeon: Kennedy Bucker, MD;  Location: [facility] ORS;  Service: Orthopedics;  Laterality: Left;    Current  Outpatient Medications:    EMGALITY 120 MG/ML SOAJ, SMARTSIG:120 Milligram(s) SUB-Q Once a Month, Disp: , Rfl:    meloxicam (MOBIC) 15 MG tablet, Take 1 tablet (15 mg total) by mouth daily., Disp: 30 tablet, Rfl: 3   amLODipine (NORVASC) 2.5 MG tablet, Take 1 tablet (2.5 mg total) by mouth daily., Disp: 90 tablet, Rfl: 1   aspirin EC 81 MG tablet, Take 81 mg by mouth every morning. , Disp: , Rfl:    buPROPion (WELLBUTRIN XL) 150 MG 24 hr tablet, Take 1 tablet (150 mg total) by mouth See admin instructions. Take with 300 mg for a total of 450 mg daily, Disp: 90 tablet, Rfl: 1   buPROPion (WELLBUTRIN XL) 300 MG 24 hr tablet, Take 1 tablet (300 mg total) by mouth See admin instructions. Take with 150 mg for a total of 450 mg daily, Disp: 90 tablet, Rfl: 1   celecoxib (CELEBREX) 100 MG capsule, TAKE 1 CAPSULE BY MOUTH DAILY. DO NOT TAKE IBUPROFEN OR ANY OTHER NSAID'S WITH THIS, Disp: 30 capsule, Rfl: 0   diclofenac Sodium (VOLTAREN) 1 % GEL, , Disp: , Rfl:    Erenumab-aooe (AIMOVIG) 140 MG/ML SOAJ, Inject 140 mg into the skin every 30 (thirty) days., Disp: 1.12 mL, Rfl: 5   escitalopram (LEXAPRO) 20 MG tablet, Take 1 tablet (20 mg total) by mouth daily., Disp: 90 tablet, Rfl: 1   FARXIGA 10 MG TABS tablet, TAKE 1 TABLET (10 MG TOTAL) BY MOUTH DAILY BEFORE BREAKFAST., Disp: 90 tablet, Rfl: 1   gabapentin (NEURONTIN) 600 MG tablet, Take 600 mg by mouth 2 (two) times daily., Disp: , Rfl:    ketoconazole (NIZORAL) 2 % cream, Apply once or twice daily to affected areas on face and neck as needed, Disp: 30 g, Rfl: 2   metFORMIN (GLUCOPHAGE-XR) 750 MG 24 hr tablet, Take 2 tablets (1,500 mg total) by mouth daily with breakfast., Disp: 180 tablet, Rfl: 1   metoprolol succinate (TOPROL-XL) 50 MG 24 hr tablet, Take 1 tablet (50 mg total) by mouth every evening. Take with or immediately following a meal., Disp: 90 tablet, Rfl: 1   NARCAN 4 MG/0.1ML LIQD nasal spray kit, 0.4 mg once., Disp: , Rfl: 0   nitroGLYCERIN  (NITROSTAT) 0.4 MG SL tablet, DISSOLVE 1 TABLET UNDER THE TONGUE EVERY 5 MINUTES FOR UP TO 3 DOSES AS NEEDED FOR CHEST PAIN. IF NO RELIEF AFTER 3 DOSES, CALL 911 OR GO TO ER., Disp: 25 tablet, Rfl: 0   olmesartan-hydrochlorothiazide (BENICAR HCT) 40-12.5 MG tablet, Take 1 tablet by mouth daily., Disp: 90 tablet, Rfl: 1   omeprazole (PRILOSEC) 40 MG capsule, Take 1 capsule (40 mg total) by mouth daily., Disp: 90 capsule, Rfl: 1   oxyCODONE (ROXICODONE) 15 MG immediate release tablet, Take 7.5-15 mg by mouth 5 (five) times daily as needed for pain., Disp: , Rfl:    rosuvastatin (CRESTOR) 40 MG tablet, Take 1 tablet (40 mg total) by mouth  daily., Disp: 90 tablet, Rfl: 3   Semaglutide (RYBELSUS) 3 MG TABS, Take 1 tablet (3 mg total) by mouth daily., Disp: , Rfl:   Allergies  Allergen Reactions   Shingrix [Zoster Vac Recomb Adjuvanted]     Rash    Elavil [Amitriptyline Hcl] Rash   Tape Rash    Paper Tape   Review of Systems Objective:  There were no vitals filed for this visit.  General: Well developed, nourished, in no acute distress, alert and oriented x3   Dermatological: Skin is warm, dry and supple bilateral. Nails x 10 are well maintained; remaining integument appears unremarkable at this time. There are no open sores, no preulcerative lesions, no rash or signs of infection present.  Vascular: Dorsalis Pedis artery and Posterior Tibial artery pedal pulses are 2/4 bilateral with immedate capillary fill time. Pedal hair growth present. No varicosities and no lower extremity edema present bilateral.   Neruologic: Grossly intact via light touch bilateral. Vibratory intact via tuning fork bilateral. Protective threshold with Semmes Wienstein monofilament intact to all pedal sites bilateral. Patellar and Achilles deep tendon reflexes 2+ bilateral. No Babinski or clonus noted bilateral.   Musculoskeletal: No gross boney pedal deformities bilateral. No pain, crepitus, or limitation noted with  foot and ankle range of motion bilateral. Muscular strength 5/5 in all groups tested bilateral.  Pain on palpation medial calcaneal tubercle of the right heel.  Gait: Unassisted, Nonantalgic.    Radiographs:  Radiographs taken today demonstrate osseously mature individual with good bone stock.  Soft tissue increase in density plantar fascial cannula insertion site with some spurring.  Assessment & Plan:   Assessment: Plantar fasciitis right.  Plan: Discussed etiology pathology conservative versus surgical therapies at this point I injected the right heel started him on meloxicam 15 mg 1 p.o. daily and we put him in a plantar fascial brace as well.     Slayter Moorhouse T. [place], [place]

## 2023-06-30 DIAGNOSIS — Z96649 Presence of unspecified artificial hip joint: Secondary | ICD-10-CM | POA: Diagnosis not present

## 2023-06-30 DIAGNOSIS — T8484XA Pain due to internal orthopedic prosthetic devices, implants and grafts, initial encounter: Secondary | ICD-10-CM | POA: Diagnosis not present

## 2023-06-30 DIAGNOSIS — M25552 Pain in left hip: Secondary | ICD-10-CM | POA: Diagnosis not present

## 2023-06-30 DIAGNOSIS — Z96642 Presence of left artificial hip joint: Secondary | ICD-10-CM | POA: Diagnosis not present

## 2023-07-06 ENCOUNTER — Other Ambulatory Visit: Payer: Self-pay | Admitting: Orthopedic Surgery

## 2023-07-06 ENCOUNTER — Other Ambulatory Visit: Payer: Self-pay | Admitting: Family Medicine

## 2023-07-06 DIAGNOSIS — Z96649 Presence of unspecified artificial hip joint: Secondary | ICD-10-CM

## 2023-07-06 DIAGNOSIS — G894 Chronic pain syndrome: Secondary | ICD-10-CM | POA: Diagnosis not present

## 2023-07-07 ENCOUNTER — Ambulatory Visit: Payer: 59 | Admitting: Dermatology

## 2023-07-07 DIAGNOSIS — L814 Other melanin hyperpigmentation: Secondary | ICD-10-CM | POA: Diagnosis not present

## 2023-07-07 DIAGNOSIS — D1801 Hemangioma of skin and subcutaneous tissue: Secondary | ICD-10-CM

## 2023-07-07 DIAGNOSIS — W908XXA Exposure to other nonionizing radiation, initial encounter: Secondary | ICD-10-CM | POA: Diagnosis not present

## 2023-07-07 DIAGNOSIS — L570 Actinic keratosis: Secondary | ICD-10-CM | POA: Diagnosis not present

## 2023-07-07 DIAGNOSIS — L719 Rosacea, unspecified: Secondary | ICD-10-CM | POA: Diagnosis not present

## 2023-07-07 DIAGNOSIS — Z85828 Personal history of other malignant neoplasm of skin: Secondary | ICD-10-CM

## 2023-07-07 DIAGNOSIS — Z5111 Encounter for antineoplastic chemotherapy: Secondary | ICD-10-CM

## 2023-07-07 DIAGNOSIS — D229 Melanocytic nevi, unspecified: Secondary | ICD-10-CM

## 2023-07-07 DIAGNOSIS — Z1283 Encounter for screening for malignant neoplasm of skin: Secondary | ICD-10-CM | POA: Diagnosis not present

## 2023-07-07 DIAGNOSIS — Z86007 Personal history of in-situ neoplasm of skin: Secondary | ICD-10-CM

## 2023-07-07 DIAGNOSIS — L578 Other skin changes due to chronic exposure to nonionizing radiation: Secondary | ICD-10-CM

## 2023-07-07 DIAGNOSIS — L820 Inflamed seborrheic keratosis: Secondary | ICD-10-CM

## 2023-07-07 DIAGNOSIS — L821 Other seborrheic keratosis: Secondary | ICD-10-CM | POA: Diagnosis not present

## 2023-07-07 MED ORDER — METRONIDAZOLE 0.75 % EX GEL: CUTANEOUS | 11 refills | Status: AC

## 2023-07-07 NOTE — Progress Notes (Signed)
Follow-Up Visit   Subjective  Jimmy Green is a 71 y.o. male who presents for the following: 6 month follow-up biopsy of the left lower lip performed 11/25/22, proven AK. 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   The patient presents for Upper Body Skin Exam (UBSE) for skin cancer screening and mole check.   The patient has spots, moles and lesions to be evaluated, some may be new or changing, including left lateral cheek area, hits with shaving and bleeds. He also has Rosacea and uses metronidazole 0.75% gel. Needs rfs.   The following portions of the chart were reviewed this encounter and updated as appropriate: medications, allergies, medical history  Review of Systems:  No other skin or systemic complaints except as noted in HPI or Assessment and Plan.  Objective  Well appearing patient in no apparent distress; mood and affect are within normal limits.  A focused examination was performed of the following areas: All skin waist up.  Relevant physical exam findings are noted in the Assessment and Plan.  Left Lower Lip Pink white smooth papule. Photo today     spinal mid back x 1, left lateral cheek x 1 (2) Erythematous stuck-on, waxy papule    Assessment & Plan   AK (actinic keratosis) Left Lower Lip  Biopsy proven, 11/25/2022. Discussed cryotherapy vs topical. Patient prefers to treat with topical.  - Start 5-fluorouracil/calcipotriene cream twice a day for 14 days to affected areas including left lower lip.  Patient provided with handout reviewing treatment course and side effects and advised to call or message [place] on MyChart with any concerns. Patient has prescription at home.   Reviewed course of treatment and expected reaction.  Patient advised to expect inflammation and crusting and advised that erosions are possible.  Patient advised to be diligent with sun protection during and after  treatment. Counseled to keep medication out of reach of children and pets.  If doesn't clear, will treat with cryotherapy next visit  Actinic keratoses are precancerous spots that appear secondary to cumulative UV radiation exposure/sun exposure over time. They are chronic with expected duration over 1 year. A portion of actinic keratoses will progress to squamous cell carcinoma of the skin. It is not possible to reliably predict which spots will progress to skin cancer and so treatment is recommended to prevent development of skin cancer.  Recommend daily broad spectrum sunscreen SPF 30+ to sun-exposed areas, reapply every 2 hours as needed.  Recommend staying in the shade or wearing long sleeves, sun glasses (UVA+UVB protection) and wide brim hats (4-inch brim around the entire circumference of the hat). Call for new or changing lesions.  Inflamed seborrheic keratosis (2) spinal mid back x 1, left lateral cheek x 1  Symptomatic, irritating, patient would like treated.  Destruction of lesion - spinal mid back x 1, left lateral cheek x 1 (2)  Destruction method: cryotherapy   Informed consent: discussed and consent obtained   Lesion destroyed using liquid nitrogen: Yes   Region frozen until ice ball extended beyond lesion: Yes   Outcome: patient tolerated procedure well with no complications   Post-procedure details: wound care instructions given   Additional details:  Prior to procedure, discussed risks of blister formation, small wound, skin dyspigmentation, or rare scar following cryotherapy. Recommend Vaseline ointment to treated areas while healing.   Skin cancer screening  Actinic skin damage  History of squamous cell  carcinoma in situ (SCCIS) of skin  History of basal cell carcinoma (BCC) of skin  Lentigo  Seborrheic keratosis  Nevus  Hemangioma of skin  Rosacea  ACTINIC DAMAGE - chronic, secondary to cumulative UV radiation exposure/sun exposure over time -  diffuse scaly erythematous macules with underlying dyspigmentation - Recommend daily broad spectrum sunscreen SPF 30+ to sun-exposed areas, reapply every 2 hours as needed.  - Recommend staying in the shade or wearing long sleeves, sun glasses (UVA+UVB protection) and wide brim hats (4-inch brim around the entire circumference of the hat). - Call for new or changing lesions.  HISTORY OF SQUAMOUS CELL CARCINOMA IN SITU OF THE SKIN, left lower lip - No evidence of recurrence today - Recommend regular full body skin exams - Recommend daily broad spectrum sunscreen SPF 30+ to sun-exposed areas, reapply every 2 hours as needed.  - Call if any new or changing lesions are noted between office visits  HISTORY OF BASAL CELL CARCINOMA OF THE SKIN - No evidence of recurrence today - Recommend regular full body skin exams - Recommend daily broad spectrum sunscreen SPF 30+ to sun-exposed areas, reapply every 2 hours as needed.  - Call if any new or changing lesions are noted between office visits  Skin cancer screening performed today.  LENTIGINES Exam: scattered tan macules Due to sun exposure Treatment Plan: Benign-appearing, observe. Recommend daily broad spectrum sunscreen SPF 30+ to sun-exposed areas, reapply every 2 hours as needed.  Call for any changes  SEBORRHEIC KERATOSIS - Stuck-on, waxy, tan-brown papules and/or plaques  - Benign-appearing - Discussed benign etiology and prognosis. - Observe - Call for any changes  MELANOCYTIC NEVI Exam: Tan-brown and/or pink-flesh-colored symmetric macules and papules  Treatment Plan: Benign appearing on exam today. Recommend observation. Call clinic for new or changing moles. Recommend daily use of broad spectrum spf 30+ sunscreen to sun-exposed areas.   HEMANGIOMA Exam: red papule(s) Discussed benign nature. Recommend observation. Call for changes.  ROSACEA Exam Mid face erythema with telangiectasias +/- scattered inflammatory  papules  Chronic and persistent condition with duration or expected duration over one year. Condition is symptomatic/ bothersome to patient. Not currently at goal.   Rosacea is a chronic progressive skin condition usually affecting the face of adults, causing redness and/or acne bumps. It is treatable but not curable. It sometimes affects the eyes (ocular rosacea) as well. It may respond to topical and/or systemic medication and can flare with stress, sun exposure, alcohol, exercise, topical steroids (including hydrocortisone/cortisone 10) and some foods.  Daily application of broad spectrum spf 30+ sunscreen to face is recommended to reduce flares.  Patient denies grittiness of the eyes  Treatment Plan Continue metronidazole gel 0.75% every day/bid   Return 2-3 months, for f/u lip.  ICherlyn Labella, CMA, am acting as scribe for Willeen Niece, MD .   Documentation: I have reviewed the above documentation for accuracy and completeness, and I agree with the above.  Willeen Niece, MD

## 2023-07-07 NOTE — Patient Instructions (Addendum)
-   Start 5-fluorouracil/calcipotriene cream twice a day for 14 days to affected areas including left lower lip.  Patient provided with handout reviewing treatment course and side effects and advised to call or message [place] on MyChart with any concerns. Patient has prescription at home.    5-Fluorouracil/Calcipotriene Patient Education   Actinic keratoses are the dry, red scaly spots on the skin caused by sun damage. A portion of these spots can turn into skin cancer with time, and treating them can help prevent development of skin cancer.   Treatment of these spots requires removal of the defective skin cells. There are various ways to remove actinic keratoses, including freezing with liquid nitrogen, treatment with creams, or treatment with a blue light procedure in the office.   5-fluorouracil cream is a topical cream used to treat actinic keratoses. It works by interfering with the growth of abnormal fast-growing skin cells, such as actinic keratoses. These cells peel off and are replaced by healthy ones.   5-fluorouracil/calcipotriene is a combination of the 5-fluorouracil cream with a vitamin D analog cream called calcipotriene. The calcipotriene alone does not treat actinic keratoses. However, when it is combined with 5-fluorouracil, it helps the 5-fluorouracil treat the actinic keratoses much faster so that the same results can be achieved with a much shorter treatment time.  INSTRUCTIONS FOR 5-FLUOROURACIL/CALCIPOTRIENE CREAM:   5-fluorouracil/calcipotriene cream typically only needs to be used for 4-7 days. A thin layer should be applied twice a day to the treatment areas recommended by your physician.   If your physician prescribed you separate tubes of 5-fluourouracil and calcipotriene, apply a thin layer of 5-fluorouracil followed by a thin layer of calcipotriene.   Avoid contact with your eyes, nostrils, and mouth. Do not use 5-fluorouracil/calcipotriene cream on infected or open wounds.    You will develop redness, irritation and some crusting at areas where you have pre-cancer damage/actinic keratoses. IF YOU DEVELOP PAIN, BLEEDING, OR SIGNIFICANT CRUSTING, STOP THE TREATMENT EARLY - you have already gotten a good response and the actinic keratoses should clear up well.  Wash your hands after applying 5-fluorouracil 5% cream on your skin.   A moisturizer or sunscreen with a minimum SPF 30 should be applied each morning.   Once you have finished the treatment, you can apply a thin layer of Vaseline twice a day to irritated areas to soothe and calm the areas more quickly. If you experience significant discomfort, contact your physician.  For some patients it is necessary to repeat the treatment for best results.  SIDE EFFECTS: When using 5-fluorouracil/calcipotriene cream, you may have mild irritation, such as redness, dryness, swelling, or a mild burning sensation. This usually resolves within 2 weeks. The more actinic keratoses you have, the more redness and inflammation you can expect during treatment. Eye irritation has been reported rarely. If this occurs, please let us know.  If you have any trouble using this cream, please call the office. If you have any other questions about this information, please do not hesitate to ask me before you leave the office.

## 2023-07-14 ENCOUNTER — Telehealth: Payer: Self-pay

## 2023-07-14 MED ORDER — FLUOROURACIL 5 % EX CREA: TOPICAL_CREAM | Freq: Two times a day (BID) | CUTANEOUS | 0 refills | Status: AC

## 2023-07-14 NOTE — Telephone Encounter (Signed)
Wife left a voicemail that pt thought he had topical 5FU at home, but he doesn't, and they would like a refill. Refill sent to [facility] and I reviewed ordering instructions with wife.

## 2023-07-21 DIAGNOSIS — I6381 Other cerebral infarction due to occlusion or stenosis of small artery: Secondary | ICD-10-CM | POA: Diagnosis not present

## 2023-07-21 DIAGNOSIS — I82C12 Acute embolism and thrombosis of left internal jugular vein: Secondary | ICD-10-CM | POA: Diagnosis not present

## 2023-07-21 DIAGNOSIS — M5481 Occipital neuralgia: Secondary | ICD-10-CM | POA: Diagnosis not present

## 2023-07-21 DIAGNOSIS — G4733 Obstructive sleep apnea (adult) (pediatric): Secondary | ICD-10-CM | POA: Diagnosis not present

## 2023-07-21 DIAGNOSIS — J321 Chronic frontal sinusitis: Secondary | ICD-10-CM | POA: Diagnosis not present

## 2023-07-21 DIAGNOSIS — R519 Headache, unspecified: Secondary | ICD-10-CM | POA: Diagnosis not present

## 2023-07-24 ENCOUNTER — Inpatient Hospital Stay: Admission: RE | Admit: 2023-07-24 | Payer: 59 | Source: Ambulatory Visit

## 2023-08-02 ENCOUNTER — Ambulatory Visit: Payer: 59 | Admitting: Podiatry

## 2023-08-03 DIAGNOSIS — G894 Chronic pain syndrome: Secondary | ICD-10-CM | POA: Diagnosis not present

## 2023-08-05 ENCOUNTER — Ambulatory Visit: Payer: 59 | Admitting: Family Medicine

## 2023-08-06 ENCOUNTER — Ambulatory Visit: Payer: 59 | Admitting: Family Medicine

## 2023-08-10 ENCOUNTER — Encounter: Payer: Self-pay | Admitting: Internal Medicine

## 2023-08-10 ENCOUNTER — Ambulatory Visit: Payer: 59 | Admitting: Internal Medicine

## 2023-08-10 ENCOUNTER — Other Ambulatory Visit: Payer: Self-pay

## 2023-08-10 VITALS — BP 126/74 | HR 79 | Temp 97.9°F | Resp 16 | Ht 69.0 in | Wt 219.7 lb

## 2023-08-10 DIAGNOSIS — Z7984 Long term (current) use of oral hypoglycemic drugs: Secondary | ICD-10-CM | POA: Diagnosis not present

## 2023-08-10 DIAGNOSIS — E785 Hyperlipidemia, unspecified: Secondary | ICD-10-CM

## 2023-08-10 DIAGNOSIS — E1165 Type 2 diabetes mellitus with hyperglycemia: Secondary | ICD-10-CM

## 2023-08-10 DIAGNOSIS — E1169 Type 2 diabetes mellitus with other specified complication: Secondary | ICD-10-CM

## 2023-08-10 DIAGNOSIS — G43009 Migraine without aura, not intractable, without status migrainosus: Secondary | ICD-10-CM | POA: Diagnosis not present

## 2023-08-10 DIAGNOSIS — F331 Major depressive disorder, recurrent, moderate: Secondary | ICD-10-CM

## 2023-08-10 DIAGNOSIS — I152 Hypertension secondary to endocrine disorders: Secondary | ICD-10-CM

## 2023-08-10 DIAGNOSIS — E1159 Type 2 diabetes mellitus with other circulatory complications: Secondary | ICD-10-CM

## 2023-08-10 LAB — POCT GLYCOSYLATED HEMOGLOBIN (HGB A1C): Hemoglobin A1C: 7.9 % — AB (ref 4.0–5.6)

## 2023-08-10 NOTE — Assessment & Plan Note (Signed)
 Blood pressure stable here today, no changes made to medications.

## 2023-08-10 NOTE — Assessment & Plan Note (Signed)
 Following with Neurology, currently on Amiovig.

## 2023-08-10 NOTE — Progress Notes (Signed)
 Established Patient Office Visit  Subjective   Patient ID: Jimmy Green, male    DOB: 01-02-1951  Age: 73 y.o. MRN: [id number]  Chief Complaint  Patient presents with   Medical Management of Chronic Issues    HPI  Patient is here for follow up on chronic medical conditions. Since his LOV, he had several changes. First he is having issues with his left hip that had been replaced previously. He is working with his Orthopedist for this, just had an MRI of the joint, no results available yet. He also has been working with his Neurologist about ongoing headaches. He had to have a nerve block done which has helped. He also had an MRI of his brain in September which showed no evidence of acute abnormality but did show mild chronic small vessel ischemic changes within the cerebral white matter and asymmetric T2 FLAIR hyperintense signal in the right petrous apex.   Hypertension: -Medications: Amlodipine  2.5 mg, Olmesartan -hydrochlorothiazide  40-12.5 mg, Metoprolol  50 mg, nitroglycerin  PRN -Patient is compliant with above medications and reports no side effects. -Denies any SOB, CP, vision changes, LE edema or symptoms of hypotension  HLD/CAD/Hx of left internal jugular vein thrombosis: -Medications: Crestor  40 mg, aspirin  -Patient is compliant with above medications and reports no side effects.  -Following with Cardiology  -Last lipid panel: Lipid Panel     Component Value Date/Time   CHOL 96 03/13/2022 0847   CHOL 149 06/07/2015 0935   TRIG 284 (H) 03/13/2022 0847   HDL 26 (L) 03/13/2022 0847   HDL 28 (L) 06/07/2015 0935   CHOLHDL 3.7 03/13/2022 0847   VLDL 29 10/01/2016 1026   LDLCALC 37 03/13/2022 0847   LABVLDL 27 06/07/2015 0935    Diabetes, Type 2: -Last A1c 8.8% -Medications: Metformin  XR 1500 mg once daily, Farxiga  10 mg. Given sample of Rybelsus  at last office visit but he stopped it due to dizziness.  -Failed Meds: Rybelsus  - states it caused dizziness -Patient is  compliant with the above medications and reports no side effects.  -Diet: working on diet  -Eye exam: Due, will call to schedule -Foot exam: UTD 4/24 -Microalbumin: UTD 10/24 -Statin: yes -PNA vaccine: UTD -Denies symptoms of hypoglycemia, polyuria, polydipsia, numbness extremities, foot ulcers/trauma.   MDD: -Mood status: controlled -Current treatment: Lexapro  20 mg, Wellbutrin  450 mg XL -Satisfied with current treatment?: yes -Duration of current treatment : chronic -Side effects: no Medication compliance: excellent compliance     05/13/2023    7:43 AM 03/25/2023    9:35 AM 11/04/2022    8:31 AM 09/21/2022   11:25 AM 09/15/2022    7:34 AM  Depression screen PHQ 2/9  Decreased Interest 0 0 0 0 0  Down, Depressed, Hopeless 0 0 0 0 0  PHQ - 2 Score 0 0 0 0 0  Altered sleeping 0  0 0 0  Tired, decreased energy 0  0 0 0  Change in appetite 0  0 0 0  Feeling bad or failure about yourself  0  0 0 0  Trouble concentrating 0  0 0 0  Moving slowly or fidgety/restless 0  0 0 0  Suicidal thoughts 0  0 0 0  PHQ-9 Score 0  0 0 0  Difficult doing work/chores Not difficult at all   Not difficult at all     Migraines:  -Currently on Aimovig   -Follows with Neurology, MRI results above  Chronic Pain in Left Lower Back:  -Following with pain management in  [place], on Oxycodone  7.5 - 15 mg 5 times daily PRN -Has Narcan available   RLS: -Currently on Gabapentin  increased to 600 mg BID by Neurosurgery   GERD: -Currently on Prilosec 40 mg, symptoms well controlled  Patient Active Problem List   Diagnosis Date Noted   Moderate episode of recurrent major depressive disorder (HCC) 08/10/2023   Type 2 diabetes mellitus with hyperglycemia, without long-term current use of insulin  (HCC) 08/10/2023   Migraine without aura and without status migrainosus, not intractable 08/10/2023   Chronic left-sided low back pain without sciatica 04/28/2022   History of thrombosis of internal jugular  vein 03/13/2022   Recurrent major depression in remission (HCC) 12/10/2021   Squamous cell carcinoma in situ 12/10/2021   History of DVT in adulthood 03/03/2021   History of colonic polyps    Polyp of transverse colon    BPH with obstruction/lower urinary tract symptoms 05/20/2018   History of basal cell carcinoma 03/23/2018   Primary localized osteoarthritis of left hip 04/06/2017   Dyslipidemia associated with type 2 diabetes mellitus (HCC) 10/04/2016   Trigger thumb of both thumbs 06/02/2016   Chronic tension-type headache, intractable 09/25/2015   Benign paroxysmal positional vertigo due to bilateral vestibular disorder 09/25/2015   Hearing loss sensory, bilateral 08/26/2015   Cerebral microvascular disease 08/26/2015   Hypertension associated with diabetes (HCC) 03/28/2015   Gastroesophageal reflux disease without esophagitis 03/28/2015   Hyperlipidemia 03/28/2015   CAD in native artery 03/28/2015   Actinic keratosis 02/14/2015   Obstructive sleep apnea 02/03/2015   Diverticulosis of colon without hemorrhage 01/31/2015   Hx of colonic polyps    Benign neoplasm of descending colon    Benign neoplasm of sigmoid colon    Sebaceous cyst 09/14/2014   Past Medical History:  Diagnosis Date   Actinic keratosis 11/25/2022   L lower lip   Anginal pain (HCC)    Basal cell carcinoma 02/21/2018   left posterior shoulder at lat edge of scar   Basal cell carcinoma 03/11/2021   Left upper back medial  SUPERFICIAL AND NODULAR PATTERNS, BASE INVOLVED, appears clear with biopsy   Basal cell carcinoma    left posterior shoulder, excised by Dr Lovenia in the past.   BCC (basal cell carcinoma of skin) 03/11/2021   left upper back lateral - SUPERFICIAL BASAL CELL CARCINOMA. Clear with biopsy.   BCC (basal cell carcinoma of skin) 03/11/2021   right mid back - BASAL CELL CARCINOMA, clear with biopsy.   Coronary artery disease    Depression    Diabetes mellitus without complication (HCC)     GERD (gastroesophageal reflux disease)    Hyperlipidemia    Hypertension    Hypogonadism in male    Metabolic syndrome    Myocardial infarction (HCC)    Orthopnea    Seborrheic keratosis    Skin cancer 2014   nose and right knee   Sleep apnea    Squamous cell carcinoma of skin 06/17/2021   Left lower lip. in situ, patient needs shave removal and poss 5FU.   Past Surgical History:  Procedure Laterality Date   BACK SURGERY     BASAL CELL CARCINOMA EXCISION Left 12/02/2015   Chest-Done by Dermatologist    BASAL CELL CARCINOMA EXCISION Left 02/21/2018   Nodulocytic pattern, deep margin involved - [facility] - Dr. Rexene Rattler   BASAL CELL CARCINOMA EXCISION  02/23/2018   CARDIAC CATHETERIZATION Left 04/01/2016   Procedure: Left Heart Cath and Coronary Angiography;  Surgeon: Vinie LABOR  Bosie, MD;  Location: [facility] INVASIVE CV LAB;  Service: Cardiovascular;  Laterality: Left;   CARDIAC CATHETERIZATION N/A 04/01/2016   Procedure: Intravascular Pressure Wire/FFR Study;  Surgeon: Marsa Dooms, MD;  Location: [facility] INVASIVE CV LAB;  Service: Cardiovascular;  Laterality: N/A;   COLONOSCOPY N/A 01/15/2015   Wohl-ileitis, 2 benign polyps, cryptitis, sigmoid diverticulosis, focal ulceration ICV   COLONOSCOPY WITH PROPOFOL  N/A 08/29/2020   Procedure: COLONOSCOPY WITH PROPOFOL ;  Surgeon: Jinny Carmine, MD;  Location: [facility] ENDOSCOPY;  Service: Endoscopy;  Laterality: N/A;   CORONARY PRESSURE/FFR STUDY N/A 09/09/2021   Procedure: INTRAVASCULAR PRESSURE WIRE/FFR STUDY;  Surgeon: Lawyer Bernardino Cough, MD;  Location: [facility] INVASIVE CV LAB;  Service: Cardiovascular;  Laterality: N/A;   CORONARY STENT PLACEMENT     FRACTIONAL FLOW RESERVE WIRE  10/08/2011   Procedure: FRACTIONAL FLOW RESERVE WIRE;  Surgeon: Rober LOISE Chroman, MD;  Location: [facility] CATH LAB;  Service: Cardiovascular;;   HERNIA REPAIR     KNEE SURGERY     LEFT HEART CATH AND CORONARY ANGIOGRAPHY N/A 09/09/2021   Procedure: LEFT HEART CATH AND  CORONARY ANGIOGRAPHY;  Surgeon: Lawyer Bernardino Cough, MD;  Location: [facility] INVASIVE CV LAB;  Service: Cardiovascular;  Laterality: N/A;   LEFT HEART CATHETERIZATION WITH CORONARY ANGIOGRAM N/A 10/08/2011   Procedure: LEFT HEART CATHETERIZATION WITH CORONARY ANGIOGRAM;  Surgeon: Rober LOISE Chroman, MD;  Location: [facility] CATH LAB;  Service: Cardiovascular;  Laterality: N/A;   parodectomy Left 05/07/2020   SKIN CANCER EXCISION  01/2018   [facility]   TOTAL HIP ARTHROPLASTY Left 04/06/2017   Procedure: TOTAL HIP ARTHROPLASTY ANTERIOR APPROACH;  Surgeon: Kathlynn Sharper, MD;  Location: [facility] ORS;  Service: Orthopedics;  Laterality: Left;   Social History   Tobacco Use   Smoking status: Former    Current packs/day: 0.00    Types: Cigarettes    Start date: 08/03/1972    Quit date: 08/03/2012    Years since quitting: 11.0   Smokeless tobacco: Never  Vaping Use   Vaping status: Never Used  Substance Use Topics   Alcohol use: No    Alcohol/week: 0.0 standard drinks of alcohol   Drug use: No   Social History   Socioeconomic History   Marital status: Married    Spouse name: Romero   Number of children: 1   Years of education: Not on file   Highest education level: High school graduate  Occupational History   Occupation: retired  Tobacco Use   Smoking status: Former    Current packs/day: 0.00    Types: Cigarettes    Start date: 08/03/1972    Quit date: 08/03/2012    Years since quitting: 11.0   Smokeless tobacco: Never  Vaping Use   Vaping status: Never Used  Substance and Sexual Activity   Alcohol use: No    Alcohol/week: 0.0 standard drinks of alcohol   Drug use: No   Sexual activity: Yes    Partners: Female    Birth control/protection: None  Other Topics Concern   Not on file  Social History Narrative   Wife has 2 children by another marriage and he has 1 child by a previous marriage.   Social Drivers of Corporate Investment Banker Strain: Low Risk  (03/25/2023)   Overall  Financial Resource Strain (CARDIA)    Difficulty of Paying Living Expenses: Not hard at all  Food Insecurity: No Food Insecurity (12/02/2021)   Hunger Vital Sign    Worried About Running Out of Food in the Last Year:  Never true    Ran Out of Food in the Last Year: Never true  Transportation Needs: No Transportation Needs (03/25/2023)   PRAPARE - Administrator, Civil Service (Medical): No    Lack of Transportation (Non-Medical): No  Physical Activity: Inactive (03/25/2023)   Exercise Vital Sign    Days of Exercise per Week: 0 days    Minutes of Exercise per Session: 0 min  Stress: No Stress Concern Present (03/25/2023)   [employer] of Occupational Health - Occupational Stress Questionnaire    Feeling of Stress : Not at all  Social Connections: Moderately Isolated (03/25/2023)   Social Connection and Isolation Panel [NHANES]    Frequency of Communication with Friends and Family: More than three times a week    Frequency of Social Gatherings with Friends and Family: Once a week    Attends Religious Services: Never    Database Administrator or Organizations: No    Attends Banker Meetings: Never    Marital Status: Married  Catering Manager Violence: Not At Risk (12/02/2021)   Humiliation, Afraid, Rape, and Kick questionnaire    Fear of Current or Ex-Partner: No    Emotionally Abused: No    Physically Abused: No    Sexually Abused: No   Family Status  Relation Name Status   Mother  Deceased   Father  Deceased   Sister  Alive   Brother  Alive   Sister  Alive   Sister  Alive  No partnership data on file   Family History  Problem Relation Age of Onset   Lung cancer Father    Heart disease Father    Skin cancer Father    Allergies  Allergen Reactions   Shingrix  [Zoster Vac Recomb Adjuvanted]     Rash    Elavil [Amitriptyline Hcl] Rash   Tape Rash    Paper Tape      Review of Systems  All other systems reviewed and are negative.      Objective:     BP 126/74 (Cuff Size: Large)   Pulse 79   Temp 97.9 F (36.6 C) (Oral)   Resp 16   Ht 5' 9 (1.753 m)   Wt 219 lb 11.2 oz (99.7 kg)   SpO2 97%   BMI 32.44 kg/m  BP Readings from Last 3 Encounters:  08/10/23 126/74  05/13/23 130/80  11/25/22 137/76   Wt Readings from Last 3 Encounters:  08/10/23 219 lb 11.2 oz (99.7 kg)  05/13/23 219 lb (99.3 kg)  03/25/23 223 lb (101.2 kg)      Physical Exam Constitutional:      Appearance: Normal appearance.  HENT:     Head: Normocephalic and atraumatic.  Eyes:     Conjunctiva/sclera: Conjunctivae normal.  Cardiovascular:     Rate and Rhythm: Normal rate and regular rhythm.  Pulmonary:     Effort: Pulmonary effort is normal.     Breath sounds: Normal breath sounds.  Skin:    General: Skin is warm and dry.  Neurological:     General: No focal deficit present.     Mental Status: He is alert. Mental status is at baseline.  Psychiatric:        Mood and Affect: Mood normal.        Behavior: Behavior normal.      Results for orders placed or performed in visit on 08/10/23  Comprehensive metabolic panel  Result Value Ref Range   eGFR 99  POCT HgB A1C  Result Value Ref Range   Hemoglobin A1C 7.9 (A) 4.0 - 5.6 %   HbA1c POC (<> result, manual entry)     HbA1c, POC (prediabetic range)     HbA1c, POC (controlled diabetic range)      Last CBC Lab Results  Component Value Date   WBC 7.3 03/13/2022   HGB 16.5 03/13/2022   HCT 49.1 03/13/2022   MCV 92.3 03/13/2022   MCH 31.0 03/13/2022   RDW 13.1 03/13/2022   PLT 270 03/13/2022   Last metabolic panel Lab Results  Component Value Date   GLUCOSE 171 (H) 03/13/2022   NA 137 03/13/2022   K 4.4 03/13/2022   CL 102 03/13/2022   CO2 27 03/13/2022   BUN 16 03/13/2022   CREATININE 0.62 (L) 03/13/2022   EGFR 99 04/16/2023   CALCIUM  9.6 03/13/2022   PROT 6.2 03/13/2022   ALBUMIN 4.0 10/01/2016   LABGLOB 2.4 06/07/2015   AGRATIO 1.6 06/07/2015   BILITOT  1.1 03/13/2022   ALKPHOS 87 10/01/2016   AST 33 03/13/2022   ALT 39 03/13/2022   ANIONGAP 6 04/09/2017   Last lipids Lab Results  Component Value Date   CHOL 96 03/13/2022   HDL 26 (L) 03/13/2022   LDLCALC 37 03/13/2022   TRIG 284 (H) 03/13/2022   CHOLHDL 3.7 03/13/2022   Last hemoglobin A1c Lab Results  Component Value Date   HGBA1C 7.9 (A) 08/10/2023   Last thyroid  functions Lab Results  Component Value Date   TSH 0.77 10/01/2016   Last vitamin D  Lab Results  Component Value Date   VD25OH 27 (L) 10/01/2016   Last vitamin B12 and Folate Lab Results  Component Value Date   VITAMINB12 397 03/13/2022   FOLATE 10.4 11/13/2020      The ASCVD Risk score (Arnett DK, et al., 2019) failed to calculate for the following reasons:   The valid total cholesterol range is 130 to 320 mg/dL    Assessment & Plan:  Type 2 diabetes mellitus with hyperglycemia, without long-term current use of insulin  (HCC) Assessment & Plan: A1c improved at 7.9% but not controlled yet. Discussed adding a GLP-1, patient does not want to start more medication but will work hard on diet. Continue Metformin  and Farxiga . Recheck A1c in 3 months.   Orders: -     POCT glycosylated hemoglobin (Hb A1C)  Hypertension associated with diabetes (HCC) Assessment & Plan: Blood pressure stable here today, no changes made to medications.    Dyslipidemia associated with type 2 diabetes mellitus (HCC) Assessment & Plan: Stable, will recheck labs at follow up, on a statin and aspirin . Reviewed MRI brain from September, appears that patient may have had ischemic attack at some point since last MRI. Reviewed things to help decrease risk of another stroke which includes BP control, cholesterol control, diabetes control, weight loss and tobacco cessation.    Moderate episode of recurrent major depressive disorder (HCC) Assessment & Plan: Stable, doing well with Lexapro  20 mg, Wellbutrin  450 mg XL.    Migraine  without aura and without status migrainosus, not intractable Assessment & Plan: Following with Neurology, currently on Amiovig.       Return in about 3 months (around 11/08/2023) for follow up on a1c; after 4/7.    Sharyle Fischer, DO

## 2023-08-10 NOTE — Assessment & Plan Note (Signed)
 Stable, will recheck labs at follow up, on a statin and aspirin . Reviewed MRI brain from September, appears that patient may have had ischemic attack at some point since last MRI. Reviewed things to help decrease risk of another stroke which includes BP control, cholesterol control, diabetes control, weight loss and tobacco cessation.

## 2023-08-10 NOTE — Assessment & Plan Note (Signed)
 A1c improved at 7.9% but not controlled yet. Discussed adding a GLP-1, patient does not want to start more medication but will work hard on diet. Continue Metformin and Farxiga. Recheck A1c in 3 months.

## 2023-08-10 NOTE — Assessment & Plan Note (Signed)
 Stable, doing well with Lexapro 20 mg, Wellbutrin 450 mg XL.

## 2023-08-13 ENCOUNTER — Other Ambulatory Visit: Payer: 59

## 2023-08-14 ENCOUNTER — Other Ambulatory Visit: Payer: 59

## 2023-08-24 ENCOUNTER — Ambulatory Visit
Admission: RE | Admit: 2023-08-24 | Discharge: 2023-08-24 | Disposition: A | Discharge status: Home / Self Care | Payer: 59 | Source: Ambulatory Visit | Attending: Orthopedic Surgery | Admitting: Orthopedic Surgery

## 2023-08-24 DIAGNOSIS — Z96642 Presence of left artificial hip joint: Secondary | ICD-10-CM | POA: Diagnosis not present

## 2023-08-24 DIAGNOSIS — T8484XA Pain due to internal orthopedic prosthetic devices, implants and grafts, initial encounter: Secondary | ICD-10-CM

## 2023-08-24 DIAGNOSIS — M25552 Pain in left hip: Secondary | ICD-10-CM | POA: Diagnosis not present

## 2023-08-31 DIAGNOSIS — G894 Chronic pain syndrome: Secondary | ICD-10-CM | POA: Diagnosis not present

## 2023-09-08 ENCOUNTER — Other Ambulatory Visit: Payer: Self-pay | Admitting: Family Medicine

## 2023-09-09 NOTE — Telephone Encounter (Signed)
 Requested medications are due for refill today.  yes  Requested medications are on the active medications list.  yes  Last refill. 07/06/2023 #60 0 rf  Future visit scheduled.   yes  Notes to clinic.  Expired labs.    Requested Prescriptions  Pending Prescriptions Disp Refills   celecoxib  (CELEBREX ) 100 MG capsule [Pharmacy Med Name: CELECOXIB  100 MG CAPS 100 Capsule] 60 capsule 1    Sig: TAKE 1 CAPSULE BY MOUTH 2 TIMES DAILY.     Analgesics:  COX2 Inhibitors Failed - 09/09/2023  2:09 PM      Failed - Manual Review: Labs are only required if the patient has taken medication for more than 8 weeks.      Failed - HGB in normal range and within 360 days    Hemoglobin  Date Value Ref Range Status  03/13/2022 16.5 13.2 - 17.1 g/dL Final  88/95/7983 85.4 12.6 - 17.7 g/dL Final         Failed - Cr in normal range and within 360 days    Creat  Date Value Ref Range Status  03/13/2022 0.62 (L) 0.70 - 1.28 mg/dL Final   Creatinine, Urine  Date Value Ref Range Status  05/13/2023 106 20 - 320 mg/dL Final         Failed - HCT in normal range and within 360 days    HCT  Date Value Ref Range Status  03/13/2022 49.1 38.5 - 50.0 % Final   Hematocrit  Date Value Ref Range Status  06/07/2015 42.7 37.5 - 51.0 % Final         Failed - AST in normal range and within 360 days    AST  Date Value Ref Range Status  03/13/2022 33 10 - 35 U/L Final         Failed - ALT in normal range and within 360 days    ALT  Date Value Ref Range Status  03/13/2022 39 9 - 46 U/L Final         Passed - eGFR is 30 or above and within 360 days    GFR, Est African American  Date Value Ref Range Status  11/13/2020 120 > OR = 60 mL/min/1.98m2 Final   GFR, Est Non African American  Date Value Ref Range Status  11/13/2020 103 > OR = 60 mL/min/1.85m2 Final   eGFR  Date Value Ref Range Status  04/16/2023 99  Final         Passed - Patient is not pregnant      Passed - Valid encounter within last  12 months    Recent Outpatient Visits           1 month ago Type 2 diabetes mellitus with hyperglycemia, without long-term current use of insulin  [facility])   [facility] Bernardo Fend, DO   3 months ago Hypertension associated with diabetes [facility])   [facility] Bernardo Fend, DO   10 months ago Dyslipidemia associated with type 2 diabetes mellitus [facility])   [facility] [place], Dorette, MD   11 months ago Chronic left-sided low back pain without sciatica   [facility] Madelon Pizza M, DO   11 months ago Acute right-sided thoracic back pain   [facility] Dorette, MD       Future Appointments             In 5 days Jackquline Sawyer, MD  [facility]   In 2 months Bernardo Fend, DO [facility], [facility]

## 2023-09-10 NOTE — Telephone Encounter (Signed)
 Per the notes he requested Dr Bud Care for that particular visit

## 2023-09-14 ENCOUNTER — Ambulatory Visit: Payer: 59 | Admitting: Dermatology

## 2023-09-15 DIAGNOSIS — T8484XA Pain due to internal orthopedic prosthetic devices, implants and grafts, initial encounter: Secondary | ICD-10-CM | POA: Diagnosis not present

## 2023-09-15 DIAGNOSIS — Z96649 Presence of unspecified artificial hip joint: Secondary | ICD-10-CM | POA: Diagnosis not present

## 2023-09-15 DIAGNOSIS — M76892 Other specified enthesopathies of left lower limb, excluding foot: Secondary | ICD-10-CM | POA: Diagnosis not present

## 2023-09-28 DIAGNOSIS — Z79891 Long term (current) use of opiate analgesic: Secondary | ICD-10-CM | POA: Diagnosis not present

## 2023-09-28 DIAGNOSIS — G894 Chronic pain syndrome: Secondary | ICD-10-CM | POA: Diagnosis not present

## 2023-10-20 ENCOUNTER — Other Ambulatory Visit: Payer: Self-pay | Admitting: Internal Medicine

## 2023-10-20 DIAGNOSIS — G43009 Migraine without aura, not intractable, without status migrainosus: Secondary | ICD-10-CM

## 2023-10-21 NOTE — Telephone Encounter (Signed)
 Requested medication (s) are due for refill today: yes   Requested medication (s) are on the active medication list: yes   Last refill:  05/13/23 1.12ml 5 refills  Future visit scheduled: yes in 2 weeks  Notes to clinic:  medication not assigned to a protocol. Do you want to refill Rx?     Requested Prescriptions  Pending Prescriptions Disp Refills   AIMOVIG 140 MG/ML SOAJ [Pharmacy Med Name: AIMOVIG 140 MG/ML SOAJ 140 Solution Auto-injector] 1 mL 6    Sig: INJECT 140 MG INTO THE SKIN EVERY 30 DAYS.     Off-Protocol Failed - 10/21/2023 12:11 PM      Failed - Medication not assigned to a protocol, review manually.      Passed - Valid encounter within last 12 months    Recent Outpatient Visits           2 months ago Type 2 diabetes mellitus with hyperglycemia, without long-term current use of insulin [facility])   [facility] Margarita Mail, DO   5 months ago Hypertension associated with diabetes [facility])   [facility] Margarita Mail, DO   11 months ago Dyslipidemia associated with type 2 diabetes mellitus [facility])   [facility] Alba Cory, MD   1 year ago Chronic left-sided low back pain without sciatica   [facility] Caro Laroche, DO   1 year ago Acute right-sided thoracic back pain   [facility] Alba Cory, MD       Future Appointments             In 2 weeks Margarita Mail, DO [facility], [facility]

## 2023-10-26 ENCOUNTER — Telehealth: Payer: Self-pay

## 2023-10-26 NOTE — Telephone Encounter (Signed)
 Refill request on   Erenumab-aooe (AIMOVIG) 140 MG/ML SOAJ

## 2023-11-03 ENCOUNTER — Other Ambulatory Visit: Payer: Self-pay | Admitting: Internal Medicine

## 2023-11-03 DIAGNOSIS — G43009 Migraine without aura, not intractable, without status migrainosus: Secondary | ICD-10-CM

## 2023-11-04 NOTE — Telephone Encounter (Signed)
 Requested medication (s) are due for refill today: Yes  Requested medication (s) are on the active medication list: Yes  Last refill:  05/13/23  Future visit scheduled: Yes  Notes to clinic:  Unable to refill due to no refill protocol for this medication.       Requested Prescriptions  Pending Prescriptions Disp Refills   AIMOVIG 140 MG/ML SOAJ [Pharmacy Med Name: AIMOVIG 140 MG/ML SOAJ 140 Solution Auto-injector] 1 mL 6    Sig: INJECT 140 MG INTO THE SKIN EVERY 30 DAYS.     Off-Protocol Failed - 11/04/2023  2:50 PM      Failed - Medication not assigned to a protocol, review manually.      Failed - Valid encounter within last 12 months    Recent Outpatient Visits   None     Future Appointments             In 5 days Margarita Mail, DO [facility], [facility]

## 2023-11-09 ENCOUNTER — Ambulatory Visit: Payer: Self-pay | Admitting: Internal Medicine

## 2023-12-09 ENCOUNTER — Other Ambulatory Visit: Payer: Self-pay
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
## 2023-12-13 ENCOUNTER — Inpatient Hospital Stay: Admitting: Family Medicine

## 2023-12-13 ENCOUNTER — Other Ambulatory Visit: Payer: Self-pay | Admitting: Family Medicine

## 2023-12-17 ENCOUNTER — Inpatient Hospital Stay: Admitting: Nurse Practitioner

## 2023-12-17 ENCOUNTER — Ambulatory Visit: Payer: Self-pay

## 2023-12-17 ENCOUNTER — Ambulatory Visit: Admitting: Nurse Practitioner

## 2023-12-17 NOTE — Progress Notes (Deleted)
 Name: Jimmy Green   MRN: [id number]    DOB: 06-02-1951   Date:12/17/2023       Progress Note  Subjective  Chief Complaint  No chief complaint on file.   I connected with  Darlina Einstein  on 12/17/23 at  2:00 PM EDT by a video enabled telemedicine application and verified that I am speaking with the correct person using two identifiers.  I discussed the limitations of evaluation and management by telemedicine and the availability of in person appointments. The patient expressed understanding and agreed to proceed with a virtual visit  Staff also discussed with the patient that there may be a patient responsible charge related to this service. Patient Location: *** Provider Location: *** Additional Individuals present: ***  HPI  *** Discussed the use of AI scribe software for clinical note transcription with the patient, who gave verbal consent to proceed.  History of Present Illness     Patient Active Problem List   Diagnosis Date Noted   Moderate episode of recurrent major depressive disorder (HCC) 08/10/2023   Type 2 diabetes mellitus with hyperglycemia, without long-term current use of insulin  (HCC) 08/10/2023   Migraine without aura and without status migrainosus, not intractable 08/10/2023   Chronic left-sided low back pain without sciatica 04/28/2022   History of thrombosis of internal jugular vein 03/13/2022   Recurrent major depression in remission (HCC) 12/10/2021   Squamous cell carcinoma in situ 12/10/2021   History of DVT in adulthood 03/03/2021   History of colonic polyps    Polyp of transverse colon    BPH with obstruction/lower urinary tract symptoms 05/20/2018   History of basal cell carcinoma 03/23/2018   Primary localized osteoarthritis of left hip 04/06/2017   Dyslipidemia associated with type 2 diabetes mellitus (HCC) 10/04/2016   Trigger thumb of both thumbs 06/02/2016   Chronic tension-type headache, intractable 09/25/2015   Benign paroxysmal  positional vertigo due to bilateral vestibular disorder 09/25/2015   Hearing loss sensory, bilateral 08/26/2015   Cerebral microvascular disease 08/26/2015   Hypertension associated with diabetes (HCC) 03/28/2015   Gastroesophageal reflux disease without esophagitis 03/28/2015   Hyperlipidemia 03/28/2015   CAD in native artery 03/28/2015   Actinic keratosis 02/14/2015   Obstructive sleep apnea 02/03/2015   Diverticulosis of colon without hemorrhage 01/31/2015   Hx of colonic polyps    Benign neoplasm of descending colon    Benign neoplasm of sigmoid colon    Sebaceous cyst 09/14/2014    Social History   Tobacco Use   Smoking status: Former    Current packs/day: 0.00    Types: Cigarettes    Start date: 08/03/1972    Quit date: 08/03/2012    Years since quitting: 11.3   Smokeless tobacco: Never  Substance Use Topics   Alcohol use: No    Alcohol/week: 0.0 standard drinks of alcohol     Current Outpatient Medications:    amLODipine  (NORVASC ) 2.5 MG tablet, Take 1 tablet (2.5 mg total) by mouth daily., Disp: 90 tablet, Rfl: 1   aspirin  EC 81 MG tablet, Take 81 mg by mouth every morning. , Disp: , Rfl:    buPROPion  (WELLBUTRIN  XL) 150 MG 24 hr tablet, Take 1 tablet (150 mg total) by mouth See admin instructions. Take with 300 mg for a total of 450 mg daily, Disp: 90 tablet, Rfl: 1   buPROPion  (WELLBUTRIN  XL) 300 MG 24 hr tablet, Take 1 tablet (300 mg total) by mouth See admin instructions. Take with 150 mg  for a total of 450 mg daily, Disp: 90 tablet, Rfl: 1   celecoxib  (CELEBREX ) 100 MG capsule, TAKE 1 CAPSULE BY MOUTH 2 TIMES DAILY., Disp: 60 capsule, Rfl: 0   diclofenac Sodium (VOLTAREN) 1 % GEL, , Disp: , Rfl:    EMGALITY 120 MG/ML SOAJ, SMARTSIG:120 Milligram(s) SUB-Q Once a Month, Disp: , Rfl:    Erenumab -aooe (AIMOVIG ) 140 MG/ML SOAJ, Inject 140 mg into the skin every 30 (thirty) days., Disp: 1.12 mL, Rfl: 5   escitalopram  (LEXAPRO ) 20 MG tablet, Take 1 tablet (20 mg total) by  mouth daily., Disp: 90 tablet, Rfl: 1   FARXIGA  10 MG TABS tablet, TAKE 1 TABLET (10 MG TOTAL) BY MOUTH DAILY BEFORE BREAKFAST., Disp: 90 tablet, Rfl: 1   fluorouracil  (EFUDEX ) 5 % cream, Apply topically 2 (two) times daily. Start 5-fluorouracil /calcipotriene cream twice a day for 14 days to affected areas including left lower lip., Disp: 30 g, Rfl: 0   gabapentin  (NEURONTIN ) 600 MG tablet, Take 600 mg by mouth 2 (two) times daily., Disp: , Rfl:    ketoconazole  (NIZORAL ) 2 % cream, Apply once or twice daily to affected areas on face and neck as needed, Disp: 30 g, Rfl: 2   metFORMIN  (GLUCOPHAGE -XR) 750 MG 24 hr tablet, Take 2 tablets (1,500 mg total) by mouth daily with breakfast., Disp: 180 tablet, Rfl: 1   metoprolol  succinate (TOPROL -XL) 50 MG 24 hr tablet, Take 1 tablet (50 mg total) by mouth every evening. Take with or immediately following a meal., Disp: 90 tablet, Rfl: 1   metroNIDAZOLE  (METROGEL ) 0.75 % gel, Apply to affected areas face once to twice daily for rosacea., Disp: 45 g, Rfl: 11   NARCAN 4 MG/0.1ML LIQD nasal spray kit, 0.4 mg once., Disp: , Rfl: 0   nitroGLYCERIN  (NITROSTAT ) 0.4 MG SL tablet, DISSOLVE 1 TABLET UNDER THE TONGUE EVERY 5 MINUTES FOR UP TO 3 DOSES AS NEEDED FOR CHEST PAIN. IF NO RELIEF AFTER 3 DOSES, CALL 911 OR GO TO ER., Disp: 25 tablet, Rfl: 0   olmesartan -hydrochlorothiazide  (BENICAR  HCT) 40-12.5 MG tablet, Take 1 tablet by mouth daily., Disp: 90 tablet, Rfl: 1   omeprazole  (PRILOSEC) 40 MG capsule, Take 1 capsule (40 mg total) by mouth daily., Disp: 90 capsule, Rfl: 1   oxyCODONE  (ROXICODONE ) 15 MG immediate release tablet, Take 7.5-15 mg by mouth 5 (five) times daily as needed for pain., Disp: , Rfl:    rosuvastatin  (CRESTOR ) 40 MG tablet, Take 1 tablet (40 mg total) by mouth daily., Disp: 90 tablet, Rfl: 3  Allergies  Allergen Reactions   Shingrix  [Zoster Vac Recomb Adjuvanted]     Rash    Elavil [Amitriptyline Hcl] Rash   Tape Rash    Paper Tape    I  personally reviewed {Reviewed:14835} with the patient/caregiver today.  ROS  ***  Objective  Virtual encounter, vitals not obtained.  There is no height or weight on file to calculate BMI.  Nursing Note and Vital Signs reviewed.  Physical Exam  ***  No results found for this or any previous visit (from the past 72 hours).  Assessment & Plan  Assessment and Plan Assessment & Plan       -Red flags and when to present for emergency care or RTC including fever >101.86F, chest pain, shortness of breath, new/worsening/un-resolving symptoms, *** reviewed with patient at time of visit. Follow up and care instructions discussed and provided in AVS. - I discussed the assessment and treatment plan with the patient. The patient  was provided an opportunity to ask questions and all were answered. The patient agreed with the plan and demonstrated an understanding of the instructions.  I provided *** minutes of non-face-to-face time during this encounter.  Quinton Buckler, FNP
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
## 2023-12-28 ENCOUNTER — Encounter: Payer: Self-pay | Admitting: Family Medicine

## 2023-12-28 ENCOUNTER — Ambulatory Visit: Admitting: Family Medicine

## 2023-12-28 VITALS — BP 126/78 | HR 81 | Resp 16 | Ht 69.0 in | Wt 199.9 lb

## 2023-12-28 DIAGNOSIS — M545 Low back pain, unspecified: Secondary | ICD-10-CM

## 2023-12-28 DIAGNOSIS — K219 Gastro-esophageal reflux disease without esophagitis: Secondary | ICD-10-CM

## 2023-12-28 DIAGNOSIS — E1159 Type 2 diabetes mellitus with other circulatory complications: Secondary | ICD-10-CM

## 2023-12-28 DIAGNOSIS — F3340 Major depressive disorder, recurrent, in remission, unspecified: Secondary | ICD-10-CM | POA: Diagnosis not present

## 2023-12-28 DIAGNOSIS — E538 Deficiency of other specified B group vitamins: Secondary | ICD-10-CM

## 2023-12-28 DIAGNOSIS — G2581 Restless legs syndrome: Secondary | ICD-10-CM | POA: Insufficient documentation

## 2023-12-28 DIAGNOSIS — E041 Nontoxic single thyroid nodule: Secondary | ICD-10-CM | POA: Insufficient documentation

## 2023-12-28 DIAGNOSIS — E278 Other specified disorders of adrenal gland: Secondary | ICD-10-CM

## 2023-12-28 DIAGNOSIS — F331 Major depressive disorder, recurrent, moderate: Secondary | ICD-10-CM | POA: Diagnosis not present

## 2023-12-28 DIAGNOSIS — E785 Hyperlipidemia, unspecified: Secondary | ICD-10-CM | POA: Diagnosis not present

## 2023-12-28 DIAGNOSIS — I152 Hypertension secondary to endocrine disorders: Secondary | ICD-10-CM

## 2023-12-28 DIAGNOSIS — G43009 Migraine without aura, not intractable, without status migrainosus: Secondary | ICD-10-CM

## 2023-12-28 DIAGNOSIS — E1169 Type 2 diabetes mellitus with other specified complication: Secondary | ICD-10-CM

## 2023-12-28 DIAGNOSIS — F1120 Opioid dependence, uncomplicated: Secondary | ICD-10-CM | POA: Insufficient documentation

## 2023-12-28 DIAGNOSIS — I2510 Atherosclerotic heart disease of native coronary artery without angina pectoris: Secondary | ICD-10-CM

## 2023-12-28 LAB — POCT GLYCOSYLATED HEMOGLOBIN (HGB A1C): Hemoglobin A1C: 7.1 % — AB (ref 4.0–5.6)

## 2023-12-28 MED ORDER — OLMESARTAN MEDOXOMIL-HCTZ 40-12.5 MG PO TABS
1.0000 | ORAL_TABLET | Freq: Every day | ORAL | 1 refills | Status: DC
Start: 2023-12-28 — End: 2024-05-30

## 2023-12-28 MED ORDER — AMLODIPINE BESYLATE 2.5 MG PO TABS
2.5000 mg | ORAL_TABLET | Freq: Every day | ORAL | 1 refills | Status: DC
Start: 2023-12-28 — End: 2024-05-30

## 2023-12-28 MED ORDER — DAPAGLIFLOZIN PROPANEDIOL 10 MG PO TABS
10.0000 mg | ORAL_TABLET | Freq: Every day | ORAL | 1 refills | Status: DC
Start: 2023-12-28 — End: 2024-05-30

## 2023-12-28 MED ORDER — BUPROPION HCL ER (XL) 150 MG PO TB24
150.0000 mg | ORAL_TABLET | ORAL | 1 refills | Status: DC
Start: 2023-12-28 — End: 2024-05-30

## 2023-12-28 MED ORDER — BUPROPION HCL ER (XL) 300 MG PO TB24
300.0000 mg | ORAL_TABLET | ORAL | 1 refills | Status: DC
Start: 2023-12-28 — End: 2024-05-30

## 2023-12-28 MED ORDER — CELECOXIB 100 MG PO CAPS: 100.0000 mg | ORAL_CAPSULE | Freq: Every day | ORAL | 1 refills | Status: DC

## 2023-12-28 MED ORDER — METFORMIN HCL ER 750 MG PO TB24
1500.0000 mg | ORAL_TABLET | Freq: Every day | ORAL | 1 refills | Status: DC
Start: 2023-12-28 — End: 2024-05-30

## 2023-12-28 MED ORDER — ESCITALOPRAM OXALATE 20 MG PO TABS: 20.0000 mg | ORAL_TABLET | Freq: Every day | ORAL | 1 refills | Status: DC

## 2023-12-28 MED ORDER — OMEPRAZOLE 40 MG PO CPDR
40.0000 mg | DELAYED_RELEASE_CAPSULE | Freq: Every day | ORAL | 1 refills | Status: DC
Start: 2023-12-28 — End: 2024-05-30
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
## 2024-02-10 ENCOUNTER — Ambulatory Visit: Payer: Self-pay

## 2024-02-10 NOTE — Telephone Encounter (Signed)
 FYI Only or Action Required?: Action required by provider: clinical question for provider.  Patient was last seen in primary care on 12/28/2023 by Glenard Mire, MD.  Called Nurse Triage reporting Mouth Lesions.  Symptoms began a week ago.  Interventions attempted: Nothing.  Symptoms are: stable.  Triage Disposition: See PCP Within 2 Weeks  Patient/caregiver understands and will follow disposition?: No, refuses disposition                             Copied from CRM [id number]. Topic: Clinical - Red Word Triage >> Feb 10, 2024 12:00 PM Berwyn MATSU wrote: Red Word that prompted transfer to Nurse Triage: painful tongue sores Reason for Disposition  All other mouth symptoms  (Exceptions: Dry mouth from not drinking enough liquids, chapped lips.)  Answer Assessment - Initial Assessment Questions 1. SYMPTOM: What's the main symptom you're concerned about? (e.g., chapped lips, dry mouth, lump, sores)     Tongue sores that pop-up on and off, wife reports sores as small, wife reports one sore at this time 2. ONSET: When did the tongue sores start?     About a week ago  3. PAIN: Is there any pain? If Yes, ask: How bad is it? (Scale: 0-10; or none, mild, moderate, severe)     Wife reports pain when patient eats 4. CAUSE: What do you think is causing the symptoms?     Wife states patient has stopped taking B12  5. OTHER SYMPTOMS: Do you have any other symptoms? (e.g., fever, sore throat, toothache, swelling)     Denies fever, denies sore throat, denies difficulty breathing, denies tongue swelling    Patient/wife would like provider's input on if stopping B12 can cause tongue sores. If so, patient would like to know how much B12 he is supposed to be taking. Please advise.  Protocols used: Mouth Symptoms-A-AH

## 2024-02-11 ENCOUNTER — Telehealth: Payer: Self-pay

## 2024-02-11 NOTE — Telephone Encounter (Signed)
 Please advice, medial records were requested and scanned in chart.
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
## 2024-02-29 ENCOUNTER — Encounter: Payer: Self-pay | Admitting: Family Medicine

## 2024-02-29 ENCOUNTER — Ambulatory Visit (INDEPENDENT_AMBULATORY_CARE_PROVIDER_SITE_OTHER): Admitting: Family Medicine

## 2024-02-29 VITALS — BP 134/80 | HR 91 | Resp 16 | Ht 69.0 in | Wt 208.0 lb

## 2024-02-29 DIAGNOSIS — K120 Recurrent oral aphthae: Secondary | ICD-10-CM

## 2024-02-29 DIAGNOSIS — R634 Abnormal weight loss: Secondary | ICD-10-CM | POA: Diagnosis not present

## 2024-02-29 DIAGNOSIS — R9089 Other abnormal findings on diagnostic imaging of central nervous system: Secondary | ICD-10-CM

## 2024-02-29 DIAGNOSIS — R9389 Abnormal findings on diagnostic imaging of other specified body structures: Secondary | ICD-10-CM | POA: Diagnosis not present

## 2024-02-29 MED ORDER — DEXAMETHASONE 0.5 MG/5ML PO SOLN: 0.5000 mg | Freq: Two times a day (BID) | ORAL | 0 refills | Status: DC

## 2024-02-29 NOTE — Progress Notes (Signed)
 Name: Jimmy Green   MRN: [id number]    DOB: 1951-06-07   Date:02/29/2024       Progress Note  Subjective  Chief Complaint  Chief Complaint  Patient presents with   Mouth Lesions    X1 month   Consult    Discuss imaging results from [facility]    Discussed the use of AI scribe software for clinical note transcription with the patient, who gave verbal consent to proceed.  History of Present Illness Jimmy Green is a 73 year old male who presents with recurrent painful lesions on his tongue.  He has experienced painful lesions on his tongue for about a month. The patient reports that the lesions move around to different spots on the tongue, including the tip, back, and sides. Each lesion lasts about a week or more before appearing elsewhere. The patient reports that the lesions are painful and sometimes resemble canker sores, although they do not always look like typical sores.  He has not tried any over-the-counter medications except for Orajel for pain relief and has not used any prescribed medications for this issue prior to this visit. There has been no significant change in his diet, although he has stopped eating potato chips and has been consuming plenty of fruit. He has experienced weight loss, dropping from 213 pounds to an unspecified lower weight, which he attributes to the pain while eating.  No other skin ulcers, changes in appetite, or dietary habits were reported. No other systemic symptoms such as rashes, sensitivity to light, indigestion, or joint pain. His past medical history includes a mass on his adrenal gland, for which he is awaiting further information from previous imaging studies conducted at [facility].  We found records scanned under media before he left. Discussed results. Did not see anything about adrenal gland. CT brain showed small vessel disease and lacunar infarct and MRI recommended. CT chest showed exophitic mass on left thyroid  and  recommended US  thyroid      Patient Active Problem List   Diagnosis Date Noted   Opioid dependence (HCC) 12/28/2023   Thyroid  nodule 12/28/2023   B12 deficiency 12/28/2023   RLS (restless legs syndrome) 12/28/2023   Adrenal incidentaloma (HCC) 12/28/2023   Moderate episode of recurrent major depressive disorder (HCC) 08/10/2023   Type 2 diabetes mellitus with hyperglycemia, without long-term current use of insulin  (HCC) 08/10/2023   Migraine without aura and without status migrainosus, not intractable 08/10/2023   Chronic left-sided low back pain without sciatica 04/28/2022   History of thrombosis of internal jugular vein 03/13/2022   Recurrent major depression in remission (HCC) 12/10/2021   Squamous cell carcinoma in situ 12/10/2021   History of DVT in adulthood 03/03/2021   History of colonic polyps    Polyp of transverse colon    BPH with obstruction/lower urinary tract symptoms 05/20/2018   History of basal cell carcinoma 03/23/2018   Primary localized osteoarthritis of left hip 04/06/2017   Dyslipidemia associated with type 2 diabetes mellitus (HCC) 10/04/2016   Trigger thumb of both thumbs 06/02/2016   Chronic tension-type headache, intractable 09/25/2015   Benign paroxysmal positional vertigo due to bilateral vestibular disorder 09/25/2015   Hearing loss sensory, bilateral 08/26/2015   Cerebral microvascular disease 08/26/2015   Hypertension associated with diabetes (HCC) 03/28/2015   Gastroesophageal reflux disease without esophagitis 03/28/2015   Hyperlipidemia 03/28/2015   CAD in native artery 03/28/2015   Actinic keratosis 02/14/2015   Obstructive sleep apnea 02/03/2015   Diverticulosis of colon  without hemorrhage 01/31/2015   Hx of colonic polyps    Benign neoplasm of descending colon    Benign neoplasm of sigmoid colon    Sebaceous cyst 09/14/2014    Social History   Tobacco Use   Smoking status: Former    Current packs/day: 0.00    Types: Cigarettes     Start date: 08/03/1972    Quit date: 08/03/2012    Years since quitting: 11.5   Smokeless tobacco: Never  Substance Use Topics   Alcohol use: No    Alcohol/week: 0.0 standard drinks of alcohol     Current Outpatient Medications:    amLODipine  (NORVASC ) 2.5 MG tablet, Take 1 tablet (2.5 mg total) by mouth daily., Disp: 90 tablet, Rfl: 1   aspirin  EC 81 MG tablet, Take 81 mg by mouth every morning. , Disp: , Rfl:    buPROPion  (WELLBUTRIN  XL) 150 MG 24 hr tablet, Take 1 tablet (150 mg total) by mouth See admin instructions. Take with 300 mg for a total of 450 mg daily, Disp: 90 tablet, Rfl: 1   buPROPion  (WELLBUTRIN  XL) 300 MG 24 hr tablet, Take 1 tablet (300 mg total) by mouth See admin instructions. Take with 150 mg for a total of 450 mg daily, Disp: 90 tablet, Rfl: 1   celecoxib  (CELEBREX ) 100 MG capsule, Take 1 capsule (100 mg total) by mouth daily., Disp: 90 capsule, Rfl: 1   dapagliflozin  propanediol (FARXIGA ) 10 MG TABS tablet, Take 1 tablet (10 mg total) by mouth daily before breakfast., Disp: 90 tablet, Rfl: 1   diclofenac Sodium (VOLTAREN) 1 % GEL, , Disp: , Rfl:    EMGALITY 120 MG/ML SOAJ, SMARTSIG:120 Milligram(s) SUB-Q Once a Month, Disp: , Rfl:    escitalopram  (LEXAPRO ) 20 MG tablet, Take 1 tablet (20 mg total) by mouth daily., Disp: 90 tablet, Rfl: 1   fluorouracil  (EFUDEX ) 5 % cream, Apply topically 2 (two) times daily. Start 5-fluorouracil /calcipotriene cream twice a day for 14 days to affected areas including left lower lip., Disp: 30 g, Rfl: 0   gabapentin  (NEURONTIN ) 600 MG tablet, Take 600 mg by mouth 2 (two) times daily., Disp: , Rfl:    ketoconazole  (NIZORAL ) 2 % cream, Apply once or twice daily to affected areas on face and neck as needed, Disp: 30 g, Rfl: 2   metFORMIN  (GLUCOPHAGE -XR) 750 MG 24 hr tablet, Take 2 tablets (1,500 mg total) by mouth daily with breakfast., Disp: 180 tablet, Rfl: 1   metoprolol  succinate (TOPROL -XL) 50 MG 24 hr tablet, Take 1 tablet (50 mg total)  by mouth every evening. Take with or immediately following a meal., Disp: 90 tablet, Rfl: 1   metroNIDAZOLE  (METROGEL ) 0.75 % gel, Apply to affected areas face once to twice daily for rosacea., Disp: 45 g, Rfl: 11   NARCAN 4 MG/0.1ML LIQD nasal spray kit, 0.4 mg once., Disp: , Rfl: 0   nitroGLYCERIN  (NITROSTAT ) 0.4 MG SL tablet, DISSOLVE 1 TABLET UNDER THE TONGUE EVERY 5 MINUTES FOR UP TO 3 DOSES AS NEEDED FOR CHEST PAIN. IF NO RELIEF AFTER 3 DOSES, CALL 911 OR GO TO ER., Disp: 25 tablet, Rfl: 0   olmesartan -hydrochlorothiazide  (BENICAR  HCT) 40-12.5 MG tablet, Take 1 tablet by mouth daily., Disp: 90 tablet, Rfl: 1   omeprazole  (PRILOSEC) 40 MG capsule, Take 1 capsule (40 mg total) by mouth daily., Disp: 90 capsule, Rfl: 1   oxyCODONE  (ROXICODONE ) 15 MG immediate release tablet, Take 7.5-15 mg by mouth 5 (five) times daily as needed for pain.,  Disp: , Rfl:    rosuvastatin  (CRESTOR ) 40 MG tablet, Take 1 tablet (40 mg total) by mouth daily., Disp: 90 tablet, Rfl: 3  Allergies  Allergen Reactions   Shingrix  [Zoster Vac Recomb Adjuvanted]     Rash    Elavil [Amitriptyline Hcl] Rash   Tape Rash    Paper Tape    ROS  Ten systems reviewed and is negative except as mentioned in HPI    Objective  Vitals:   02/29/24 1258  BP: 134/80  Pulse: 91  Resp: 16  SpO2: 99%  Weight: 208 lb (94.3 kg)  Height: 5' 9 (1.753 m)    Body mass index is 30.72 kg/m.    Physical Exam  CONSTITUTIONAL: Patient appears well-developed and well-nourished. No distress. HEENT: Head atraumatic, normocephalic, neck supple. Tongue tender to palpation. Tongue - tender lesion tip of the tongue, small ulceration behind. See attached photo CARDIOVASCULAR: Normal rate, regular rhythm and normal heart sounds. No murmur heard. No BLE edema. PULMONARY: Effort normal and breath sounds normal. No respiratory distress. MUSCULOSKELETAL: Normal gait. Without gross motor or sensory deficit. PSYCHIATRIC: Patient has a normal  mood and affect. Behavior is normal. Judgment and thought content normal.     Recent Results (from the past 2160 hours)  POCT glycosylated hemoglobin (Hb A1C)     Status: Abnormal   Collection Time: 12/28/23  2:11 PM  Result Value Ref Range   Hemoglobin A1C 7.1 (A) 4.0 - 5.6 %   HbA1c POC (<> result, manual entry)     HbA1c, POC (prediabetic range)     HbA1c, POC (controlled diabetic range)        Assessment & Plan Recurrent oral ulcers Recurrent oral ulcers for one month, painful, non-healing. Considered nutritional deficiencies or systemic conditions as causes. - Order blood tests: B12, folate, iron, vitamin D , zinc, CBC, comprehensive metabolic panel, C-reactive protein. - Prescribe dexamethasone  solution 0.5 mg/5 ml , swish and spit twice daily. - Evaluate blood test results for underlying causes.  Unintentional weight loss Weight loss potentially linked to oral ulcers causing eating discomfort. - Monitor weight and nutritional intake. - Evaluate blood test results for underlying causes.  Exophytic lesion left thyroid   -US  thyroid   Abnormal CT brain -MRI brain ordered as recommended

## 2024-03-02 ENCOUNTER — Ambulatory Visit

## 2024-03-03 ENCOUNTER — Other Ambulatory Visit: Payer: Self-pay

## 2024-03-03 ENCOUNTER — Ambulatory Visit: Payer: Self-pay | Admitting: Family Medicine

## 2024-03-03 DIAGNOSIS — E041 Nontoxic single thyroid nodule: Secondary | ICD-10-CM

## 2024-03-04 LAB — COMPREHENSIVE METABOLIC PANEL WITH GFR
AG Ratio: 1.7 (calc) (ref 1.0–2.5)
ALT: 28 U/L (ref 9–46)
AST: 26 U/L (ref 10–35)
Albumin: 4.1 g/dL (ref 3.6–5.1)
Alkaline phosphatase (APISO): 105 U/L (ref 35–144)
BUN/Creatinine Ratio: 26 (calc) — ABNORMAL HIGH (ref 6–22)
BUN: 15 mg/dL (ref 7–25)
CO2: 30 mmol/L (ref 20–32)
Calcium: 10.6 mg/dL — ABNORMAL HIGH (ref 8.6–10.3)
Chloride: 102 mmol/L (ref 98–110)
Creat: 0.58 mg/dL — ABNORMAL LOW (ref 0.70–1.28)
Globulin: 2.4 g/dL (ref 1.9–3.7)
Glucose, Bld: 121 mg/dL — ABNORMAL HIGH (ref 65–99)
Potassium: 4 mmol/L (ref 3.5–5.3)
Sodium: 141 mmol/L (ref 135–146)
Total Bilirubin: 1.5 mg/dL — ABNORMAL HIGH (ref 0.2–1.2)
Total Protein: 6.5 g/dL (ref 6.1–8.1)
eGFR: 104 mL/min/1.73m2 (ref >=60)

## 2024-03-04 LAB — IRON,TIBC AND FERRITIN PANEL
%SAT: 28 % (ref 20–48)
Ferritin: 96 ng/mL (ref 24–380)
Iron: 91 ug/dL (ref 50–180)
TIBC: 324 ug/dL (ref 250–425)

## 2024-03-04 LAB — TSH: TSH: 1.06 m[IU]/L (ref 0.40–4.50)

## 2024-03-04 LAB — C-REACTIVE PROTEIN: CRP: <3 mg/L (ref <8.0)

## 2024-03-04 LAB — CBC WITH DIFFERENTIAL/PLATELET
Absolute Lymphocytes: 1414 {cells}/uL (ref 850–3900)
Absolute Monocytes: 593 {cells}/uL (ref 200–950)
Basophils Absolute: 63 {cells}/uL (ref 0–200)
Basophils Relative: 0.8 %
Eosinophils Absolute: 182 {cells}/uL (ref 15–500)
Eosinophils Relative: 2.3 %
HCT: 51 % — ABNORMAL HIGH (ref 38.5–50.0)
Hemoglobin: 16.4 g/dL (ref 13.2–17.1)
MCH: 30.5 pg (ref 27.0–33.0)
MCHC: 32.2 g/dL (ref 32.0–36.0)
MCV: 95 fL (ref 80.0–100.0)
MPV: 10.7 fL (ref 7.5–12.5)
Monocytes Relative: 7.5 %
Neutro Abs: 5649 {cells}/uL (ref 1500–7800)
Neutrophils Relative %: 71.5 %
Platelets: 312 Thousand/uL (ref 140–400)
RBC: 5.37 Million/uL (ref 4.20–5.80)
RDW: 13.1 % (ref 11.0–15.0)
Total Lymphocyte: 17.9 %
WBC: 7.9 Thousand/uL (ref 3.8–10.8)

## 2024-03-04 LAB — ZINC: Zinc: 62 ug/dL (ref 60–130)

## 2024-03-04 LAB — B12 AND FOLATE PANEL
Folate: 8 ng/mL
Vitamin B-12: 1061 pg/mL (ref 200–1100)

## 2024-03-04 LAB — VITAMIN D 25 HYDROXY (VIT D DEFICIENCY, FRACTURES): Vit D, 25-Hydroxy: 48 ng/mL (ref 30–100)

## 2024-03-06 ENCOUNTER — Ambulatory Visit
Admission: RE | Admit: 2024-03-06 | Discharge: 2024-03-06 | Disposition: A | Discharge status: Home / Self Care | Source: Ambulatory Visit | Attending: Family Medicine | Admitting: Family Medicine

## 2024-03-06 DIAGNOSIS — R9389 Abnormal findings on diagnostic imaging of other specified body structures: Secondary | ICD-10-CM | POA: Insufficient documentation

## 2024-03-06 DIAGNOSIS — R9089 Other abnormal findings on diagnostic imaging of central nervous system: Secondary | ICD-10-CM | POA: Diagnosis present

## 2024-03-07 LAB — PARATHYROID HORMONE, INTACT (NO CA): PTH: 64 pg/mL (ref 16–77)

## 2024-03-07 LAB — CALCIUM: Calcium: 9.9 mg/dL (ref 8.6–10.3)

## 2024-03-12 ENCOUNTER — Ambulatory Visit: Payer: Self-pay | Admitting: Family Medicine

## 2024-03-14 ENCOUNTER — Telehealth: Payer: Self-pay

## 2024-03-14 NOTE — Telephone Encounter (Signed)
 Pt wife notified.

## 2024-03-14 NOTE — Telephone Encounter (Signed)
 Pt and wife still concerned about mouth lesions/sores.  What else do you recommend? He does have an upcoming appt with ENT for thyroid  biopsy should he address with them or see his dentist or derm?

## 2024-03-30 ENCOUNTER — Ambulatory Visit: Payer: 59

## 2024-03-30 DIAGNOSIS — Z0000 Encounter for general adult medical examination without abnormal findings: Secondary | ICD-10-CM | POA: Diagnosis not present

## 2024-03-30 NOTE — Patient Instructions (Signed)
 Mr. Pote , Thank you for taking time out of your busy schedule to complete your Annual Wellness Visit with me. I enjoyed our conversation and look forward to speaking with you again next year. I, as well as your care team,  appreciate your ongoing commitment to your health goals. Please review the following plan we discussed and let me know if I can assist you in the future.   Follow up Visits: 04/05/25 @ 9:30 AM BY PHONE We will see or speak with you next year for your Next Medicare AWV with our clinical staff Have you seen your provider in the last 6 months (3 months if uncontrolled diabetes)? Yes  Clinician Recommendations:  Aim for 30 minutes of exercise or brisk walking, 6-8 glasses of water, and 5 servings of fruits and vegetables each day. TAKE CARE!      This is a list of the screenings recommended for you:  Health Maintenance  Topic Date Due   Eye exam for diabetics  12/20/2021   COVID-19 Vaccine (4 - 2024-25 season) 04/04/2023   Flu Shot  03/03/2024   DTaP/Tdap/Td vaccine (2 - Td or Tdap) 04/03/2024   Yearly kidney health urinalysis for diabetes  05/12/2024   Hemoglobin A1C  06/29/2024   Complete foot exam   12/27/2024   Yearly kidney function blood test for diabetes  02/28/2025   Medicare Annual Wellness Visit  03/30/2025   Colon Cancer Screening  08/30/2027   Pneumococcal Vaccine for age over 33  Completed   Hepatitis C Screening  Completed   HPV Vaccine  Aged Out   Meningitis B Vaccine  Aged Out   Zoster (Shingles) Vaccine  Discontinued    Advanced directives: (ACP Link)Information on Advanced Care Planning can be found at [place]  Secretary of State Advance Health Care Directives Advance Health Care Directives. [email or web address]  Advance Care Planning is important because it:  [x]  Makes sure you receive the medical care that is consistent with your values, goals, and preferences  [x]  It provides guidance to your family and loved ones and reduces their decisional burden  about whether or not they are making the right decisions based on your wishes.  Follow the link provided in your after visit summary or read over the paperwork we have mailed to you to help you started getting your Advance Directives in place. If you need assistance in completing these, please reach out to us  so that we can help you!

## 2024-03-30 NOTE — Progress Notes (Signed)
 Subjective:   Jimmy Green is a 73 y.o. who presents for a Medicare Wellness preventive visit.  As a reminder, Annual Wellness Visits don't include a physical exam, and some assessments may be limited, especially if this visit is performed virtually. We may recommend an in-person follow-up visit with your provider if needed.  Visit Complete: Virtual I connected with  Jimmy Green on 03/30/24 by a audio enabled telemedicine application and verified that I am speaking with the correct person using two identifiers.  Patient Location: Home  Provider Location: Home Office  I discussed the limitations of evaluation and management by telemedicine. The patient expressed understanding and agreed to proceed.  Vital Signs: Because this visit was a virtual/telehealth visit, some criteria may be missing or patient reported. Any vitals not documented were not able to be obtained and vitals that have been documented are patient reported.  VideoDeclined- This patient declined Librarian, academic. Therefore the visit was completed with audio only.  Persons Participating in Visit: Patient.  AWV Questionnaire: No: Patient Medicare AWV questionnaire was not completed prior to this visit.  Cardiac Risk Factors include: advanced age (>76men, >29 women);diabetes mellitus;dyslipidemia;hypertension;male gender;obesity (BMI >30kg/m2)     Objective:    There were no vitals filed for this visit. There is no height or weight on file to calculate BMI.     03/30/2024   10:19 AM 03/25/2023    9:37 AM 12/02/2021    1:45 PM 09/12/2020    8:27 AM 08/29/2020    8:36 AM 06/23/2019    8:54 AM 06/16/2018   11:16 AM  Advanced Directives  Does Patient Have a Medical Advance Directive? No No No No No No No   Does patient want to make changes to medical advance directive?      No - Patient declined   Would patient like information on creating a medical advance directive? No - Patient  declined  Yes (MAU/Ambulatory/Procedural Areas - Information given) No - Patient declined   Yes (MAU/Ambulatory/Procedural Areas - Information given)      Data saved with a previous flowsheet row definition    Current Medications (verified) Outpatient Encounter Medications as of 03/30/2024  Medication Sig   amLODipine  (NORVASC ) 2.5 MG tablet Take 1 tablet (2.5 mg total) by mouth daily.   aspirin  EC 81 MG tablet Take 81 mg by mouth every morning.    buPROPion  (WELLBUTRIN  XL) 150 MG 24 hr tablet Take 1 tablet (150 mg total) by mouth See admin instructions. Take with 300 mg for a total of 450 mg daily   buPROPion  (WELLBUTRIN  XL) 300 MG 24 hr tablet Take 1 tablet (300 mg total) by mouth See admin instructions. Take with 150 mg for a total of 450 mg daily   celecoxib  (CELEBREX ) 100 MG capsule Take 1 capsule (100 mg total) by mouth daily.   dapagliflozin  propanediol (FARXIGA ) 10 MG TABS tablet Take 1 tablet (10 mg total) by mouth daily before breakfast.   dexamethasone  (DECADRON ) 0.5 MG/5ML solution Take 5 mLs (0.5 mg total) by mouth 2 (two) times daily. Swish and spit   diclofenac Sodium (VOLTAREN) 1 % GEL    EMGALITY 120 MG/ML SOAJ SMARTSIG:120 Milligram(s) SUB-Q Once a Month   escitalopram  (LEXAPRO ) 20 MG tablet Take 1 tablet (20 mg total) by mouth daily.   fluorouracil  (EFUDEX ) 5 % cream Apply topically 2 (two) times daily. Start 5-fluorouracil /calcipotriene cream twice a day for 14 days to affected areas including left lower lip.  gabapentin  (NEURONTIN ) 600 MG tablet Take 600 mg by mouth 2 (two) times daily.   ketoconazole  (NIZORAL ) 2 % cream Apply once or twice daily to affected areas on face and neck as needed   metFORMIN  (GLUCOPHAGE -XR) 750 MG 24 hr tablet Take 2 tablets (1,500 mg total) by mouth daily with breakfast.   metoprolol  succinate (TOPROL -XL) 50 MG 24 hr tablet Take 1 tablet (50 mg total) by mouth every evening. Take with or immediately following a meal.   metroNIDAZOLE  (METROGEL )  0.75 % gel Apply to affected areas face once to twice daily for rosacea.   NARCAN 4 MG/0.1ML LIQD nasal spray kit 0.4 mg once.   nitroGLYCERIN  (NITROSTAT ) 0.4 MG SL tablet DISSOLVE 1 TABLET UNDER THE TONGUE EVERY 5 MINUTES FOR UP TO 3 DOSES AS NEEDED FOR CHEST PAIN. IF NO RELIEF AFTER 3 DOSES, CALL 911 OR GO TO ER.   olmesartan -hydrochlorothiazide  (BENICAR  HCT) 40-12.5 MG tablet Take 1 tablet by mouth daily.   omeprazole  (PRILOSEC) 40 MG capsule Take 1 capsule (40 mg total) by mouth daily.   oxyCODONE  (ROXICODONE ) 15 MG immediate release tablet Take 7.5-15 mg by mouth 5 (five) times daily as needed for pain.   rosuvastatin  (CRESTOR ) 40 MG tablet Take 1 tablet (40 mg total) by mouth daily.   No facility-administered encounter medications on file as of 03/30/2024.    Allergies (verified) Shingrix  [zoster vac recomb adjuvanted], Elavil [amitriptyline hcl], and Tape   History: Past Medical History:  Diagnosis Date   Actinic keratosis 11/25/2022   L lower lip   Anginal pain (HCC)    Basal cell carcinoma 02/21/2018   left posterior shoulder at lat edge of scar   Basal cell carcinoma 03/11/2021   Left upper back medial  SUPERFICIAL AND NODULAR PATTERNS, BASE INVOLVED, appears clear with biopsy   Basal cell carcinoma    left posterior shoulder, excised by Dr Lovenia in the past.   BCC (basal cell carcinoma of skin) 03/11/2021   left upper back lateral - SUPERFICIAL BASAL CELL CARCINOMA. Clear with biopsy.   BCC (basal cell carcinoma of skin) 03/11/2021   right mid back - BASAL CELL CARCINOMA, clear with biopsy.   Coronary artery disease    Depression    Diabetes mellitus without complication (HCC)    GERD (gastroesophageal reflux disease)    Hyperlipidemia    Hypertension    Hypogonadism in male    Metabolic syndrome    Myocardial infarction (HCC)    Orthopnea    Seborrheic keratosis    Skin cancer 2014   nose and right knee   Sleep apnea    Squamous cell carcinoma of skin  06/17/2021   Left lower lip. in situ, patient needs shave removal and poss 5FU.   Past Surgical History:  Procedure Laterality Date   BACK SURGERY     BASAL CELL CARCINOMA EXCISION Left 12/02/2015   Chest-Done by Dermatologist    BASAL CELL CARCINOMA EXCISION Left 02/21/2018   Nodulocytic pattern, deep margin involved - [facility] - Dr. Rexene Rattler   BASAL CELL CARCINOMA EXCISION  02/23/2018   CARDIAC CATHETERIZATION Left 04/01/2016   Procedure: Left Heart Cath and Coronary Angiography;  Surgeon: Vinie DELENA Jude, MD;  Location: [facility] INVASIVE CV LAB;  Service: Cardiovascular;  Laterality: Left;   CARDIAC CATHETERIZATION N/A 04/01/2016   Procedure: Intravascular Pressure Wire/FFR Study;  Surgeon: Marsa Dooms, MD;  Location: [facility] INVASIVE CV LAB;  Service: Cardiovascular;  Laterality: N/A;   COLONOSCOPY N/A 01/15/2015  Wohl-ileitis, 2 benign polyps, cryptitis, sigmoid diverticulosis, focal ulceration ICV   COLONOSCOPY WITH PROPOFOL  N/A 08/29/2020   Procedure: COLONOSCOPY WITH PROPOFOL ;  Surgeon: Jinny Carmine, MD;  Location: [facility] ENDOSCOPY;  Service: Endoscopy;  Laterality: N/A;   CORONARY PRESSURE/FFR STUDY N/A 09/09/2021   Procedure: INTRAVASCULAR PRESSURE WIRE/FFR STUDY;  Surgeon: Lawyer Bernardino Cough, MD;  Location: [facility] INVASIVE CV LAB;  Service: Cardiovascular;  Laterality: N/A;   CORONARY STENT PLACEMENT     FRACTIONAL FLOW RESERVE WIRE  10/08/2011   Procedure: FRACTIONAL FLOW RESERVE WIRE;  Surgeon: Rober LOISE Chroman, MD;  Location: [facility] CATH LAB;  Service: Cardiovascular;;   HERNIA REPAIR     KNEE SURGERY     LEFT HEART CATH AND CORONARY ANGIOGRAPHY N/A 09/09/2021   Procedure: LEFT HEART CATH AND CORONARY ANGIOGRAPHY;  Surgeon: Lawyer Bernardino Cough, MD;  Location: [facility] INVASIVE CV LAB;  Service: Cardiovascular;  Laterality: N/A;   LEFT HEART CATHETERIZATION WITH CORONARY ANGIOGRAM N/A 10/08/2011   Procedure: LEFT HEART CATHETERIZATION WITH CORONARY ANGIOGRAM;  Surgeon: Rober LOISE Chroman, MD;  Location: [facility] CATH LAB;  Service: Cardiovascular;  Laterality: N/A;   parodectomy Left 05/07/2020   SKIN CANCER EXCISION  01/2018   [facility]   TOTAL HIP ARTHROPLASTY Left 04/06/2017   Procedure: TOTAL HIP ARTHROPLASTY ANTERIOR APPROACH;  Surgeon: Kathlynn Sharper, MD;  Location: [facility] ORS;  Service: Orthopedics;  Laterality: Left;   Family History  Problem Relation Age of Onset   Lung cancer Father    Heart disease Father    Skin cancer Father    Social History   Socioeconomic History   Marital status: Married    Spouse name: Romero   Number of children: 1   Years of education: Not on file   Highest education level: High school graduate  Occupational History   Occupation: retired  Tobacco Use   Smoking status: Former    Current packs/day: 0.00    Types: Cigarettes    Start date: 08/03/1972    Quit date: 08/03/2012    Years since quitting: 11.6   Smokeless tobacco: Never  Vaping Use   Vaping status: Never Used  Substance and Sexual Activity   Alcohol use: No    Alcohol/week: 0.0 standard drinks of alcohol   Drug use: No   Sexual activity: Yes    Partners: Female    Birth control/protection: None  Other Topics Concern   Not on file  Social History Narrative   Wife has 2 children by another marriage and he has 1 child by a previous marriage.   Social Drivers of Corporate investment banker Strain: Low Risk  (03/30/2024)   Overall Financial Resource Strain (CARDIA)    Difficulty of Paying Living Expenses: Not hard at all  Food Insecurity: No Food Insecurity (03/30/2024)   Hunger Vital Sign    Worried About Running Out of Food in the Last Year: Never true    Ran Out of Food in the Last Year: Never true  Transportation Needs: No Transportation Needs (03/30/2024)   PRAPARE - Administrator, Civil Service (Medical): No    Lack of Transportation (Non-Medical): No  Physical Activity: Sufficiently Active (03/30/2024)   Exercise Vital Sign    Days  of Exercise per Week: 3 days    Minutes of Exercise per Session: 60 min  Stress: No Stress Concern Present (03/30/2024)   [employer] of Occupational Health - Occupational Stress Questionnaire    Feeling of Stress: Not at all  Social  Connections: Moderately Integrated (03/30/2024)   Social Connection and Isolation Panel    Frequency of Communication with Friends and Family: More than three times a week    Frequency of Social Gatherings with Friends and Family: Three times a week    Attends Religious Services: Never    Active Member of Clubs or Organizations: Yes    Attends Banker Meetings: Never    Marital Status: Married    Tobacco Counseling Counseling given: Not Answered    Clinical Intake:  Pre-visit preparation completed: Yes  Pain : No/denies pain     BMI - recorded: 30.7 Nutritional Status: BMI > 30  Obese Nutritional Risks: None Diabetes: Yes CBG done?: No Did pt. bring in CBG monitor from home?: No  Lab Results  Component Value Date   HGBA1C 7.1 (A) 12/28/2023   HGBA1C 7.9 (A) 08/10/2023   HGBA1C 8.8 (A) 05/13/2023     How often do you need to have someone help you when you read instructions, pamphlets, or other written materials from your doctor or pharmacy?: 1 - Never  Interpreter Needed?: No  Information entered by :: JHONNIE DAS, LPN   Activities of Daily Living    03/30/2024   10:21 AM 05/13/2023    7:43 AM  In your present state of health, do you have any difficulty performing the following activities:  Hearing? 0 1  Vision? 0 0  Difficulty concentrating or making decisions? 1 1  Comment MEMORY   Walking or climbing stairs? 1 1  Dressing or bathing? 0 0  Doing errands, shopping? 0 0  Preparing Food and eating ? N   Using the Toilet? N   In the past six months, have you accidently leaked urine? N   Do you have problems with loss of bowel control? N   Managing your Medications? N   Managing your Finances? N    Housekeeping or managing your Housekeeping? N     Patient Care Team: Sowles, Krichna, MD as PCP - General (Family Medicine) Dannial Hacker, MD as Referring Physician (Pain Medicine) Jackquline Sawyer, MD (Dermatology) Jinny Carmine, MD as Consulting Physician (Gastroenterology) Lawyer Bernardino Cough, MD as Referring Physician (Cardiology) Pa, [facility] (Optometry)  I have updated your Care Teams any recent Medical Services you may have received from other providers in the past year.     Assessment:   This is a routine wellness examination for Jimmy Green.  Hearing/Vision screen Hearing Screening - Comments:: NO AIDS Vision Screening - Comments:: [facility]- NEEDS APPT   Goals Addressed             This Visit's Progress    DIET - EAT MORE FRUITS AND VEGETABLES         Depression Screen     03/30/2024   10:18 AM 12/28/2023    2:04 PM 05/13/2023    7:43 AM 03/25/2023    9:35 AM 11/04/2022    8:31 AM 09/21/2022   11:25 AM 09/15/2022    7:34 AM  PHQ 2/9 Scores  PHQ - 2 Score 1 0 0 0 0 0 0  PHQ- 9 Score 2 0 0  0 0 0    Fall Risk     03/30/2024   10:20 AM 12/28/2023    2:04 PM 08/10/2023    9:28 AM 05/13/2023    7:43 AM 03/25/2023    9:26 AM  Fall Risk   Falls in the past year? 0 0  0 0  Number falls  in past yr: 0 0 0 0 0  Injury with Fall? 0 0 0 0 0  Risk for fall due to : No Fall Risks No Fall Risks No Fall Risks No Fall Risks No Fall Risks  Follow up Falls evaluation completed;Falls prevention discussed Falls prevention discussed;Education provided;Falls evaluation completed Falls evaluation completed Falls prevention discussed;Education provided;Falls evaluation completed Education provided;Falls prevention discussed    MEDICARE RISK AT HOME:  Medicare Risk at Home Any stairs in or around the home?: No If so, are there any without handrails?: No Home free of loose throw rugs in walkways, pet beds, electrical cords, etc?: Yes Adequate lighting in your home  to reduce risk of falls?: Yes Life alert?: No Use of a cane, walker or w/c?: No Grab bars in the bathroom?: Yes Shower chair or bench in shower?: No Elevated toilet seat or a handicapped toilet?: Yes  TIMED UP AND GO:  Was the test performed?  No  Cognitive Function: 6CIT completed        03/30/2024   10:23 AM 03/25/2023    9:38 AM  6CIT Screen  What Year? 0 points 0 points  What month? 0 points 0 points  What time? 0 points 0 points  Count back from 20 0 points 0 points  Months in reverse 4 points 0 points  Repeat phrase 0 points 0 points  Total Score 4 points 0 points    Immunizations Immunization History  Administered Date(s) Administered   Fluad Quad(high Dose 65+) 05/09/2019, 04/28/2022   Fluad Trivalent(High Dose 65+) 05/10/2023   INFLUENZA, HIGH DOSE SEASONAL PF 05/11/2017, 05/20/2018, 03/31/2021   Influenza,inj,Quad PF,6+ Mos 03/28/2015, 05/11/2016, 05/09/2020   Influenza-Unspecified 04/03/2014   PFIZER(Purple Top)SARS-COV-2 Vaccination 08/25/2019, 09/14/2019, 05/20/2020   PNEUMOCOCCAL CONJUGATE-20 04/28/2022   Pneumococcal Conjugate-13 04/03/2014   Pneumococcal Polysaccharide-23 04/07/2017   Tdap 04/03/2014   Zoster Recombinant(Shingrix ) 12/16/2017   Zoster, Live 06/18/2014    Screening Tests Health Maintenance  Topic Date Due   OPHTHALMOLOGY EXAM  12/20/2021   COVID-19 Vaccine (4 - 2024-25 season) 04/04/2023   INFLUENZA VACCINE  03/03/2024   DTaP/Tdap/Td (2 - Td or Tdap) 04/03/2024   Diabetic kidney evaluation - Urine ACR  05/12/2024   HEMOGLOBIN A1C  06/29/2024   FOOT EXAM  12/27/2024   Diabetic kidney evaluation - eGFR measurement  02/28/2025   Medicare Annual Wellness (AWV)  03/30/2025   Colonoscopy  08/30/2027   Pneumococcal Vaccine: 50+ Years  Completed   Hepatitis C Screening  Completed   HPV VACCINES  Aged Out   Meningococcal B Vaccine  Aged Out   Zoster Vaccines- Shingrix   Discontinued    Health Maintenance  Health Maintenance Due   Topic Date Due   OPHTHALMOLOGY EXAM  12/20/2021   COVID-19 Vaccine (4 - 2024-25 season) 04/04/2023   INFLUENZA VACCINE  03/03/2024   Health Maintenance Items Addressed: UP TO DATE ON COLONOSCOPY; UP TO DATE ON PNA, TDAP, SHINGRIX - NEEDS COVID   Additional Screening:  Vision Screening: Recommended annual ophthalmology exams for early detection of glaucoma and other disorders of the eye. Would you like a referral to an eye doctor? No    Dental Screening: Recommended annual dental exams for proper oral hygiene  Community Resource Referral / Chronic Care Management: CRR required this visit?  No   CCM required this visit?  No   Plan:    I have personally reviewed and noted the following in the patient's chart:   Medical and social history Use of alcohol, tobacco or  illicit drugs  Current medications and supplements including opioid prescriptions. Patient is currently taking opioid prescriptions. Information provided to patient regarding non-opioid alternatives. Patient advised to discuss non-opioid treatment plan with their provider. Functional ability and status Nutritional status Physical activity Advanced directives List of other physicians Hospitalizations, surgeries, and ER visits in previous 12 months Vitals Screenings to include cognitive, depression, and falls Referrals and appointments  In addition, I have reviewed and discussed with patient certain preventive protocols, quality metrics, and best practice recommendations. A written personalized care plan for preventive services as well as general preventive health recommendations were provided to patient.   Jhonnie GORMAN Das, LPN   [id number]/[id number]   After Visit Summary: (MyChart) Due to this being a telephonic visit, the after visit summary with patients personalized plan was offered to patient via MyChart   Notes: Nothing significant to report at this time.

## 2024-04-11 ENCOUNTER — Other Ambulatory Visit: Payer: Self-pay | Admitting: Unknown Physician Specialty

## 2024-04-11 DIAGNOSIS — E041 Nontoxic single thyroid nodule: Secondary | ICD-10-CM

## 2024-04-18 NOTE — Progress Notes (Signed)
 Patient for US  guided FNA LT Inferior thyroid  nodule biopsy on Wed 04/19/24, I called and spoke with the patient's wife, Romero on the phone and gave pre-procedure instructions. Romero was made aware to have the patient here at 12:30p and check in at the [facility] [employer] [facility]. Romero stated understanding. Called 04/18/24

## 2024-04-19 ENCOUNTER — Other Ambulatory Visit: Payer: Self-pay | Admitting: Family Medicine

## 2024-04-19 ENCOUNTER — Ambulatory Visit
Admission: RE | Admit: 2024-04-19 | Discharge: 2024-04-19 | Disposition: A | Discharge status: Home / Self Care | Source: Ambulatory Visit | Attending: Unknown Physician Specialty | Admitting: Unknown Physician Specialty

## 2024-04-19 ENCOUNTER — Other Ambulatory Visit: Payer: Self-pay | Admitting: Unknown Physician Specialty

## 2024-04-19 DIAGNOSIS — E041 Nontoxic single thyroid nodule: Secondary | ICD-10-CM

## 2024-04-19 DIAGNOSIS — K120 Recurrent oral aphthae: Secondary | ICD-10-CM

## 2024-05-12 ENCOUNTER — Other Ambulatory Visit: Payer: Self-pay | Admitting: Unknown Physician Specialty

## 2024-05-12 DIAGNOSIS — E041 Nontoxic single thyroid nodule: Secondary | ICD-10-CM

## 2024-05-12 DIAGNOSIS — Q892 Congenital malformations of other endocrine glands: Secondary | ICD-10-CM

## 2024-05-12 DIAGNOSIS — E049 Nontoxic goiter, unspecified: Secondary | ICD-10-CM

## 2024-05-22 ENCOUNTER — Other Ambulatory Visit: Payer: Self-pay | Admitting: Emergency Medicine

## 2024-05-22 DIAGNOSIS — E119 Type 2 diabetes mellitus without complications: Secondary | ICD-10-CM

## 2024-05-30 ENCOUNTER — Ambulatory Visit: Admitting: Family Medicine

## 2024-05-30 ENCOUNTER — Encounter: Payer: Self-pay | Admitting: Family Medicine

## 2024-05-30 VITALS — BP 126/76 | HR 75 | Resp 16 | Ht 69.0 in | Wt 204.5 lb

## 2024-05-30 DIAGNOSIS — G43009 Migraine without aura, not intractable, without status migrainosus: Secondary | ICD-10-CM

## 2024-05-30 DIAGNOSIS — I6789 Other cerebrovascular disease: Secondary | ICD-10-CM

## 2024-05-30 DIAGNOSIS — Z7984 Long term (current) use of oral hypoglycemic drugs: Secondary | ICD-10-CM

## 2024-05-30 DIAGNOSIS — G8929 Other chronic pain: Secondary | ICD-10-CM

## 2024-05-30 DIAGNOSIS — I2510 Atherosclerotic heart disease of native coronary artery without angina pectoris: Secondary | ICD-10-CM

## 2024-05-30 DIAGNOSIS — F1120 Opioid dependence, uncomplicated: Secondary | ICD-10-CM

## 2024-05-30 DIAGNOSIS — F3340 Major depressive disorder, recurrent, in remission, unspecified: Secondary | ICD-10-CM

## 2024-05-30 DIAGNOSIS — I152 Hypertension secondary to endocrine disorders: Secondary | ICD-10-CM

## 2024-05-30 DIAGNOSIS — Z23 Encounter for immunization: Secondary | ICD-10-CM

## 2024-05-30 DIAGNOSIS — K219 Gastro-esophageal reflux disease without esophagitis: Secondary | ICD-10-CM

## 2024-05-30 DIAGNOSIS — E1159 Type 2 diabetes mellitus with other circulatory complications: Secondary | ICD-10-CM | POA: Diagnosis not present

## 2024-05-30 DIAGNOSIS — M5442 Lumbago with sciatica, left side: Secondary | ICD-10-CM

## 2024-05-30 DIAGNOSIS — E041 Nontoxic single thyroid nodule: Secondary | ICD-10-CM

## 2024-05-30 DIAGNOSIS — E1169 Type 2 diabetes mellitus with other specified complication: Secondary | ICD-10-CM | POA: Diagnosis not present

## 2024-05-30 LAB — POCT GLYCOSYLATED HEMOGLOBIN (HGB A1C): Hemoglobin A1C: 8.5 % — AB (ref 4.0–5.6)

## 2024-05-30 MED ORDER — DAPAGLIFLOZIN PROPANEDIOL 10 MG PO TABS: 10.0000 mg | ORAL_TABLET | Freq: Every day | ORAL | 1 refills | Status: AC

## 2024-05-30 MED ORDER — BUPROPION HCL ER (XL) 300 MG PO TB24: 300.0000 mg | ORAL_TABLET | ORAL | 1 refills | Status: AC

## 2024-05-30 MED ORDER — BUPROPION HCL ER (XL) 150 MG PO TB24: 150.0000 mg | ORAL_TABLET | ORAL | 1 refills | Status: AC

## 2024-05-30 MED ORDER — ROSUVASTATIN CALCIUM 40 MG PO TABS: 40.0000 mg | ORAL_TABLET | Freq: Every day | ORAL | 3 refills | Status: AC

## 2024-05-30 MED ORDER — OLMESARTAN MEDOXOMIL-HCTZ 40-12.5 MG PO TABS: 1.0000 | ORAL_TABLET | Freq: Every day | ORAL | 1 refills | Status: AC

## 2024-05-30 MED ORDER — PIOGLITAZONE HCL 15 MG PO TABS: 15.0000 mg | ORAL_TABLET | Freq: Every day | ORAL | 1 refills | Status: AC

## 2024-05-30 MED ORDER — METFORMIN HCL ER 750 MG PO TB24: 1500.0000 mg | ORAL_TABLET | Freq: Every day | ORAL | 1 refills | Status: AC

## 2024-05-30 MED ORDER — OMEPRAZOLE 40 MG PO CPDR: 40.0000 mg | DELAYED_RELEASE_CAPSULE | Freq: Every day | ORAL | 1 refills | Status: AC

## 2024-05-30 MED ORDER — AMLODIPINE BESYLATE 2.5 MG PO TABS: 2.5000 mg | ORAL_TABLET | Freq: Every day | ORAL | 1 refills | Status: AC

## 2024-05-30 MED ORDER — METOPROLOL SUCCINATE ER 50 MG PO TB24: 50.0000 mg | ORAL_TABLET | Freq: Every evening | ORAL | 1 refills | Status: AC

## 2024-05-30 MED ORDER — ESCITALOPRAM OXALATE 20 MG PO TABS: 20.0000 mg | ORAL_TABLET | Freq: Every day | ORAL | 1 refills | Status: AC

## 2024-05-30 NOTE — Patient Instructions (Signed)
 Referral has been sent to:   [facility]   P: [phone] F: [phone]   Release ID # [id number]

## 2024-05-30 NOTE — Progress Notes (Signed)
 Name: Jimmy Green   MRN: [id number]    DOB: 1951-06-08   Date:05/30/2024       Progress Note  Subjective  Chief Complaint  Chief Complaint  Patient presents with   Medical Management of Chronic Issues   Discussed the use of AI scribe software for clinical note transcription with the patient, who gave verbal consent to proceed.  History of Present Illness Jimmy Green is a 73 year old male who presents for a regular follow-up visit.  He has a history of diabetes, currently not well-controlled with an A1c of 8.5%, up from 7.1% in May. He is taking Farxiga  10 mg daily and Metformin  1500 mg daily. No increased hunger, thirst, or frequent urination, although he experiences nocturia, which he attributes to taking HCTZ in the evening.  He experiences chronic low back pain with radiculitis, primarily on the left side, with occasional radiation to the leg. His pain level is 6 out of 10. He is currently taking oxycodone  15 mg immediate release and gabapentin  600 mg twice daily for pain management. He discontinued Celebrex  due to adverse reactions, specifically mouth lesions, which resolved after stopping the medication.  He has a known exophytic lesion in the left thyroid , identified during a CT scan. A biopsy was not performed because the ENT doctor said there were too many blood vessels around the lesion.  He underwent an MRI of the brain, which showed mild chronic small vessel ischemic disease. The MRI showed mild chronic small vessel ischemic disease. 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  He reports unintentional weight loss, having lost four pounds since the last visit, which he attributes to a decreased appetite and reduced food intake.  He has a history of coronary artery disease and is taking rosuvastatin , metoprolol  50 mg, and olmesartan  HCTZ 40/12.5 mg for management. No chest pain or palpitations.  He reports a significant reduction in  migraine frequency since a previous accident, now experiencing migraines about once every other week. He is receiving Emgality injections for migraine prevention.  He is currently taking Wellbutrin  150 mg plus 300 mg daily and Lexapro  20 mg daily for depression, which he reports is in remission. No feelings of being down, depressed, or hopeless.  No current symptoms of bronchitis, such as a persistent cough.    Patient Active Problem List   Diagnosis Date Noted   Opioid dependence (HCC) 12/28/2023   Thyroid  nodule 12/28/2023   B12 deficiency 12/28/2023   RLS (restless legs syndrome) 12/28/2023   Adrenal incidentaloma 12/28/2023   Moderate episode of recurrent major depressive disorder (HCC) 08/10/2023   Type 2 diabetes mellitus with hyperglycemia, without long-term current use of insulin  (HCC) 08/10/2023   Migraine without aura and without status migrainosus, not intractable 08/10/2023   Chronic left-sided low back pain without sciatica 04/28/2022   History of thrombosis of internal jugular vein 03/13/2022   Recurrent major depression in remission 12/10/2021   Squamous cell carcinoma in situ 12/10/2021   History of DVT in adulthood 03/03/2021   History of colonic polyps    Polyp of transverse colon    BPH with obstruction/lower urinary tract symptoms 05/20/2018   History of basal cell carcinoma 03/23/2018   Primary localized osteoarthritis of left hip 04/06/2017   Dyslipidemia associated with type 2 diabetes mellitus (HCC) 10/04/2016   Trigger thumb of both thumbs 06/02/2016   Chronic tension-type headache, intractable 09/25/2015   Benign paroxysmal positional vertigo due to bilateral  vestibular disorder 09/25/2015   Hearing loss sensory, bilateral 08/26/2015   Cerebral microvascular disease 08/26/2015   Hypertension associated with diabetes (HCC) 03/28/2015   Gastroesophageal reflux disease without esophagitis 03/28/2015   Hyperlipidemia 03/28/2015   CAD in native artery  03/28/2015   Actinic keratosis 02/14/2015   Obstructive sleep apnea 02/03/2015   Diverticulosis of colon without hemorrhage 01/31/2015   Hx of colonic polyps    Benign neoplasm of descending colon    Benign neoplasm of sigmoid colon    Sebaceous cyst 09/14/2014    Past Surgical History:  Procedure Laterality Date   BACK SURGERY     BASAL CELL CARCINOMA EXCISION Left 12/02/2015   Chest-Done by Dermatologist    BASAL CELL CARCINOMA EXCISION Left 02/21/2018   Nodulocytic pattern, deep margin involved - [facility] - Dr. Rexene Rattler   BASAL CELL CARCINOMA EXCISION  02/23/2018   CARDIAC CATHETERIZATION Left 04/01/2016   Procedure: Left Heart Cath and Coronary Angiography;  Surgeon: Vinie DELENA Jude, MD;  Location: [facility] INVASIVE CV LAB;  Service: Cardiovascular;  Laterality: Left;   CARDIAC CATHETERIZATION N/A 04/01/2016   Procedure: Intravascular Pressure Wire/FFR Study;  Surgeon: Marsa Dooms, MD;  Location: [facility] INVASIVE CV LAB;  Service: Cardiovascular;  Laterality: N/A;   COLONOSCOPY N/A 01/15/2015   Wohl-ileitis, 2 benign polyps, cryptitis, sigmoid diverticulosis, focal ulceration ICV   COLONOSCOPY WITH PROPOFOL  N/A 08/29/2020   Procedure: COLONOSCOPY WITH PROPOFOL ;  Surgeon: Jinny Carmine, MD;  Location: [facility] ENDOSCOPY;  Service: Endoscopy;  Laterality: N/A;   CORONARY PRESSURE/FFR STUDY N/A 09/09/2021   Procedure: INTRAVASCULAR PRESSURE WIRE/FFR STUDY;  Surgeon: Lawyer Bernardino Cough, MD;  Location: [facility] INVASIVE CV LAB;  Service: Cardiovascular;  Laterality: N/A;   CORONARY STENT PLACEMENT     FRACTIONAL FLOW RESERVE WIRE  10/08/2011   Procedure: FRACTIONAL FLOW RESERVE WIRE;  Surgeon: Rober LOISE Chroman, MD;  Location: [facility] CATH LAB;  Service: Cardiovascular;;   HERNIA REPAIR     KNEE SURGERY     LEFT HEART CATH AND CORONARY ANGIOGRAPHY N/A 09/09/2021   Procedure: LEFT HEART CATH AND CORONARY ANGIOGRAPHY;  Surgeon: Lawyer Bernardino Cough, MD;  Location: [facility] INVASIVE CV LAB;  Service:  Cardiovascular;  Laterality: N/A;   LEFT HEART CATHETERIZATION WITH CORONARY ANGIOGRAM N/A 10/08/2011   Procedure: LEFT HEART CATHETERIZATION WITH CORONARY ANGIOGRAM;  Surgeon: Rober LOISE Chroman, MD;  Location: [facility] CATH LAB;  Service: Cardiovascular;  Laterality: N/A;   parodectomy Left 05/07/2020   SKIN CANCER EXCISION  01/2018   [facility]   TOTAL HIP ARTHROPLASTY Left 04/06/2017   Procedure: TOTAL HIP ARTHROPLASTY ANTERIOR APPROACH;  Surgeon: Kathlynn Sharper, MD;  Location: [facility] ORS;  Service: Orthopedics;  Laterality: Left;    Family History  Problem Relation Age of Onset   Lung cancer Father    Heart disease Father    Skin cancer Father     Social History   Tobacco Use   Smoking status: Former    Current packs/day: 0.00    Types: Cigarettes    Start date: 08/03/1972    Quit date: 08/03/2012    Years since quitting: 11.8   Smokeless tobacco: Never  Substance Use Topics   Alcohol use: No    Alcohol/week: 0.0 standard drinks of alcohol     Current Outpatient Medications:    acetaminophen  (TYLENOL ) 500 MG tablet, Take 500 mg by mouth in the morning, at noon, in the evening, and at bedtime., Disp: , Rfl:    aspirin  EC 81 MG tablet, Take 81  mg by mouth every morning. , Disp: , Rfl:    diclofenac Sodium (VOLTAREN) 1 % GEL, , Disp: , Rfl:    EMGALITY 120 MG/ML SOAJ, SMARTSIG:120 Milligram(s) SUB-Q Once a Month, Disp: , Rfl:    fluorouracil  (EFUDEX ) 5 % cream, Apply topically 2 (two) times daily. Start 5-fluorouracil /calcipotriene cream twice a day for 14 days to affected areas including left lower lip., Disp: 30 g, Rfl: 0   gabapentin  (NEURONTIN ) 600 MG tablet, Take 600 mg by mouth 2 (two) times daily., Disp: , Rfl:    ketoconazole  (NIZORAL ) 2 % cream, Apply once or twice daily to affected areas on face and neck as needed, Disp: 30 g, Rfl: 2   metroNIDAZOLE  (METROGEL ) 0.75 % gel, Apply to affected areas face once to twice daily for rosacea., Disp: 45 g, Rfl: 11   NARCAN 4 MG/0.1ML  LIQD nasal spray kit, 0.4 mg once., Disp: , Rfl: 0   nitroGLYCERIN  (NITROSTAT ) 0.4 MG SL tablet, DISSOLVE 1 TABLET UNDER THE TONGUE EVERY 5 MINUTES FOR UP TO 3 DOSES AS NEEDED FOR CHEST PAIN. IF NO RELIEF AFTER 3 DOSES, CALL 911 OR GO TO ER., Disp: 25 tablet, Rfl: 0   oxyCODONE  (ROXICODONE ) 15 MG immediate release tablet, Take 7.5-15 mg by mouth 5 (five) times daily as needed for pain., Disp: , Rfl:    pioglitazone (ACTOS) 15 MG tablet, Take 1 tablet (15 mg total) by mouth daily., Disp: 90 tablet, Rfl: 1   amLODipine  (NORVASC ) 2.5 MG tablet, Take 1 tablet (2.5 mg total) by mouth daily., Disp: 90 tablet, Rfl: 1   buPROPion  (WELLBUTRIN  XL) 150 MG 24 hr tablet, Take 1 tablet (150 mg total) by mouth See admin instructions. Take with 300 mg for a total of 450 mg daily, Disp: 90 tablet, Rfl: 1   buPROPion  (WELLBUTRIN  XL) 300 MG 24 hr tablet, Take 1 tablet (300 mg total) by mouth See admin instructions. Take with 150 mg for a total of 450 mg daily, Disp: 90 tablet, Rfl: 1   dapagliflozin  propanediol (FARXIGA ) 10 MG TABS tablet, Take 1 tablet (10 mg total) by mouth daily before breakfast., Disp: 90 tablet, Rfl: 1   escitalopram  (LEXAPRO ) 20 MG tablet, Take 1 tablet (20 mg total) by mouth daily., Disp: 90 tablet, Rfl: 1   metFORMIN  (GLUCOPHAGE -XR) 750 MG 24 hr tablet, Take 2 tablets (1,500 mg total) by mouth daily with breakfast., Disp: 180 tablet, Rfl: 1   metoprolol  succinate (TOPROL -XL) 50 MG 24 hr tablet, Take 1 tablet (50 mg total) by mouth every evening. Take with or immediately following a meal., Disp: 90 tablet, Rfl: 1   olmesartan -hydrochlorothiazide  (BENICAR  HCT) 40-12.5 MG tablet, Take 1 tablet by mouth daily., Disp: 90 tablet, Rfl: 1   omeprazole  (PRILOSEC) 40 MG capsule, Take 1 capsule (40 mg total) by mouth daily., Disp: 90 capsule, Rfl: 1   rosuvastatin  (CRESTOR ) 40 MG tablet, Take 1 tablet (40 mg total) by mouth daily., Disp: 90 tablet, Rfl: 3  Allergies  Allergen Reactions   Shingrix  [Zoster  Vac Recomb Adjuvanted]     Rash    Elavil [Amitriptyline Hcl] Rash   Tape Rash    Paper Tape    I personally reviewed active problem list, medication list, allergies, family history with the patient/caregiver today.   ROS  Ten systems reviewed and is negative except as mentioned in HPI    Objective Physical Exam  CONSTITUTIONAL: Patient appears well-developed and well-nourished.  No distress. HEENT: Head atraumatic, normocephalic, neck supple. CARDIOVASCULAR: Normal  rate, regular rhythm and normal heart sounds.  No murmur heard. No BLE edema. PULMONARY: Effort normal and breath sounds normal. No respiratory distress. ABDOMINAL: There is no tenderness or distention. MUSCULOSKELETAL: Normal gait. Without gross motor or sensory deficit. PSYCHIATRIC: Patient has a normal mood and affect. behavior is normal. Judgment and thought content normal.  Vitals:   05/30/24 0919  BP: 126/76  Pulse: 75  Resp: 16  SpO2: 95%  Weight: 204 lb 8 oz (92.8 kg)  Height: 5' 9 (1.753 m)    Body mass index is 30.2 kg/m.  Recent Results (from the past 2160 hours)  Parathyroid  hormone, intact (no Ca)     Status: None   Collection Time: 03/06/24  1:23 PM  Result Value Ref Range   PTH 64 16 - 77 pg/mL    Comment: . Interpretive Guide    Intact PTH           Calcium  ------------------    ----------           ------- Normal Parathyroid     Normal               Normal Hypoparathyroidism    Low or Low Normal    Low Hyperparathyroidism    Primary            Normal or High       High    Secondary          High                 Normal or Low    Tertiary           High                 High Non-Parathyroid     Hypercalcemia      Low or Low Normal    High .   Calcium      Status: None   Collection Time: 03/06/24  1:23 PM  Result Value Ref Range   Calcium  9.9 8.6 - 10.3 mg/dL  POCT glycosylated hemoglobin (Hb A1C)     Status: Abnormal   Collection Time: 05/30/24  9:22 AM  Result Value Ref Range    Hemoglobin A1C 8.5 (A) 4.0 - 5.6 %   HbA1c POC (<> result, manual entry)     HbA1c, POC (prediabetic range)     HbA1c, POC (controlled diabetic range)      PHQ2/9:    05/30/2024    9:12 AM 03/30/2024   10:18 AM 12/28/2023    2:04 PM 05/13/2023    7:43 AM 03/25/2023    9:35 AM  Depression screen PHQ 2/9  Decreased Interest 0 0 0 0 0  Down, Depressed, Hopeless 0 1 0 0 0  PHQ - 2 Score 0 1 0 0 0  Altered sleeping  0 0 0   Tired, decreased energy  1 0 0   Change in appetite  0 0 0   Feeling bad or failure about yourself   0 0 0   Trouble concentrating  0 0 0   Moving slowly or fidgety/restless  0 0 0   Suicidal thoughts  0 0 0   PHQ-9 Score  2 0 0   Difficult doing work/chores  Not difficult at all Not difficult at all Not difficult at all     phq 9 is negative  Fall Risk:    05/30/2024    9:12 AM 03/30/2024   10:20 AM 12/28/2023  2:04 PM 08/10/2023    9:28 AM 05/13/2023    7:43 AM  Fall Risk   Falls in the past year? 0 0 0  0  Number falls in past yr: 0 0 0 0 0  Injury with Fall? 0 0 0 0 0  Risk for fall due to : No Fall Risks No Fall Risks No Fall Risks No Fall Risks No Fall Risks  Follow up Falls evaluation completed Falls evaluation completed;Falls prevention discussed Falls prevention discussed;Education provided;Falls evaluation completed Falls evaluation completed Falls prevention discussed;Education provided;Falls evaluation completed      Assessment & Plan Type 2 diabetes mellitus, uncontrolled with associated dyslipidemia and HTN A1c increased from 7.1% to 8.5%. - Add pioglitazone 15 mg to regimen. - Monitor blood glucose levels. - Encourage dietary modifications and increased physical activity.  Coronary artery disease/microvascular disease brain Blood pressure controlled at 126/76 mmHg. - Continue current cardiac medications. - Monitor blood pressure and cholesterol levels. - Emphasize importance of glycemic control.  Hypertension  Blood pressure  well-controlled with current medications. - Continue current antihypertensive medications. - Monitor blood pressure regularly.  Dyslipidemia Managed with rosuvastatin . - Order lipid panel. - Continue rosuvastatin .  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 Celebrex  discontinued due to adverse effects. - Continue oxycodone  and gabapentin . - Add Tylenol  for additional pain relief. - Consider referral to a new pain clinic.  Migraine, without aura and without status migrainous  Episodic migraines managed with Emgality injections. - Continue Emgality injections.  Exophytic thyroid  nodule under surveillance Exophytic lesion in left thyroid  under surveillance. - Continue surveillance with CT scans. - Follow up with endocrinologist.  Major depressive disorder, in remission Depression in remission with Wellbutrin  and Lexapro . - Continue Wellbutrin  and Lexapro .  Health counseling  Weight loss noted, no longer morbidly obese. - Continue current medications. - Encourage healthy lifestyle and diet. - Schedule eye exam with [facility].

## 2024-05-31 ENCOUNTER — Ambulatory Visit: Payer: Self-pay | Admitting: Family Medicine

## 2024-05-31 LAB — MICROALBUMIN / CREATININE URINE RATIO
Creatinine, Urine: 137 mg/dL (ref 20–320)
Microalb Creat Ratio: 29 mg/g{creat} (ref <30)
Microalb, Ur: 4 mg/dL

## 2024-05-31 LAB — COMPREHENSIVE METABOLIC PANEL WITH GFR
AG Ratio: 1.6 (calc) (ref 1.0–2.5)
ALT: 20 U/L (ref 9–46)
AST: 18 U/L (ref 10–35)
Albumin: 3.9 g/dL (ref 3.6–5.1)
Alkaline phosphatase (APISO): 95 U/L (ref 35–144)
BUN/Creatinine Ratio: 14 (calc) (ref 6–22)
BUN: 10 mg/dL (ref 7–25)
CO2: 29 mmol/L (ref 20–32)
Calcium: 10 mg/dL (ref 8.6–10.3)
Chloride: 103 mmol/L (ref 98–110)
Creat: 0.69 mg/dL — ABNORMAL LOW (ref 0.70–1.28)
Globulin: 2.5 g/dL (ref 1.9–3.7)
Glucose, Bld: 172 mg/dL — ABNORMAL HIGH (ref 65–99)
Potassium: 4.5 mmol/L (ref 3.5–5.3)
Sodium: 140 mmol/L (ref 135–146)
Total Bilirubin: 1.7 mg/dL — ABNORMAL HIGH (ref 0.2–1.2)
Total Protein: 6.4 g/dL (ref 6.1–8.1)
eGFR: 98 mL/min/1.73m2 (ref >=60)

## 2024-05-31 LAB — LIPID PANEL
Cholesterol: 88 mg/dL (ref <200)
HDL: 28 mg/dL — ABNORMAL LOW (ref >=40)
LDL Cholesterol (Calc): 40 mg/dL
Non-HDL Cholesterol (Calc): 60 mg/dL (ref <130)
Total CHOL/HDL Ratio: 3.1 (calc) (ref <5.0)
Triglycerides: 123 mg/dL (ref <150)

## 2024-08-21 ENCOUNTER — Other Ambulatory Visit: Payer: Self-pay | Admitting: Family Medicine

## 2024-08-21 DIAGNOSIS — I2510 Atherosclerotic heart disease of native coronary artery without angina pectoris: Secondary | ICD-10-CM

## 2024-08-22 NOTE — Telephone Encounter (Signed)
 Requested Prescriptions  Pending Prescriptions Disp Refills   nitroGLYCERIN  (NITROSTAT ) 0.4 MG SL tablet [Pharmacy Med Name: NITROGLYCERIN  0.4 MG SUBL 0.4 Tablet] 25 tablet 0    Sig: DISSOLVE 1 TABLET UNDER THE TONGUE EVERY 5 MINUTES FOR UP TO 3 DOSES AS NEEDED FOR CHEST PAIN. IF NO RELIEF AFTER 3 DOSES, CALL 911 OR GO TO ER.     Cardiovascular:  Nitrates Passed - 08/22/2024  1:24 PM      Passed - Last BP in normal range    BP Readings from Last 1 Encounters:  05/30/24 126/76         Passed - Last Heart Rate in normal range    Pulse Readings from Last 1 Encounters:  05/30/24 75         Passed - Valid encounter within last 12 months    Recent Outpatient Visits           2 months ago Dyslipidemia associated with type 2 diabetes mellitus [facility])   [place] [facility] Glenard Mire, MD   5 months ago Recurrent canker sores   [place] [facility] [place], Mire, MD   7 months ago Dyslipidemia associated with type 2 diabetes mellitus [facility])   [facility] S[place], Krichna, MD

## 2024-10-04 ENCOUNTER — Other Ambulatory Visit

## 2024-11-30 ENCOUNTER — Ambulatory Visit: Admitting: Family Medicine

## 2025-04-05 ENCOUNTER — Ambulatory Visit
